# Patient Record
Sex: Female | Born: 1955
Health system: Southern US, Community
[De-identification: ages and names within clinical notes are randomized; demographics above are authoritative.]

## PROBLEM LIST (undated history)

## (undated) DIAGNOSIS — T7840XA Allergy, unspecified, initial encounter: Secondary | ICD-10-CM

## (undated) DIAGNOSIS — M1990 Unspecified osteoarthritis, unspecified site: Secondary | ICD-10-CM

## (undated) DIAGNOSIS — C801 Malignant (primary) neoplasm, unspecified: Secondary | ICD-10-CM

## (undated) DIAGNOSIS — IMO0001 Reserved for inherently not codable concepts without codable children: Secondary | ICD-10-CM

## (undated) DIAGNOSIS — C50919 Malignant neoplasm of unspecified site of unspecified female breast: Secondary | ICD-10-CM

## (undated) DIAGNOSIS — E781 Pure hyperglyceridemia: Secondary | ICD-10-CM

## (undated) DIAGNOSIS — Z9889 Other specified postprocedural states: Secondary | ICD-10-CM

## (undated) DIAGNOSIS — M8700 Idiopathic aseptic necrosis of unspecified bone: Secondary | ICD-10-CM

## (undated) DIAGNOSIS — R112 Nausea with vomiting, unspecified: Secondary | ICD-10-CM

## (undated) DIAGNOSIS — G629 Polyneuropathy, unspecified: Secondary | ICD-10-CM

## (undated) DIAGNOSIS — K589 Irritable bowel syndrome without diarrhea: Secondary | ICD-10-CM

## (undated) DIAGNOSIS — Z5189 Encounter for other specified aftercare: Secondary | ICD-10-CM

## (undated) DIAGNOSIS — I10 Essential (primary) hypertension: Secondary | ICD-10-CM

## (undated) DIAGNOSIS — K760 Fatty (change of) liver, not elsewhere classified: Secondary | ICD-10-CM

## (undated) DIAGNOSIS — G43909 Migraine, unspecified, not intractable, without status migrainosus: Secondary | ICD-10-CM

## (undated) DIAGNOSIS — K149 Disease of tongue, unspecified: Secondary | ICD-10-CM

## (undated) DIAGNOSIS — R002 Palpitations: Secondary | ICD-10-CM

## (undated) DIAGNOSIS — K7581 Nonalcoholic steatohepatitis (NASH): Secondary | ICD-10-CM

## (undated) DIAGNOSIS — M8580 Other specified disorders of bone density and structure, unspecified site: Secondary | ICD-10-CM

## (undated) DIAGNOSIS — E8881 Metabolic syndrome: Secondary | ICD-10-CM

## (undated) DIAGNOSIS — M722 Plantar fascial fibromatosis: Secondary | ICD-10-CM

## (undated) DIAGNOSIS — G4762 Sleep related leg cramps: Secondary | ICD-10-CM

## (undated) DIAGNOSIS — IMO0002 Reserved for concepts with insufficient information to code with codable children: Secondary | ICD-10-CM

## (undated) DIAGNOSIS — Z923 Personal history of irradiation: Secondary | ICD-10-CM

## (undated) DIAGNOSIS — Z9221 Personal history of antineoplastic chemotherapy: Secondary | ICD-10-CM

## (undated) DIAGNOSIS — K219 Gastro-esophageal reflux disease without esophagitis: Secondary | ICD-10-CM

## (undated) DIAGNOSIS — D239 Other benign neoplasm of skin, unspecified: Secondary | ICD-10-CM

## (undated) DIAGNOSIS — Z853 Personal history of malignant neoplasm of breast: Secondary | ICD-10-CM

## (undated) HISTORY — DX: Metabolic syndrome: E88.810

## (undated) HISTORY — DX: Reserved for inherently not codable concepts without codable children: IMO0001

## (undated) HISTORY — DX: Fatty (change of) liver, not elsewhere classified: K76.0

## (undated) HISTORY — DX: Irritable bowel syndrome, unspecified: K58.9

## (undated) HISTORY — DX: Reserved for concepts with insufficient information to code with codable children: IMO0002

## (undated) HISTORY — DX: Other benign neoplasm of skin, unspecified: D23.9

## (undated) HISTORY — DX: Nonalcoholic steatohepatitis (NASH): K75.81

## (undated) HISTORY — DX: Gastro-esophageal reflux disease without esophagitis: K21.9

## (undated) HISTORY — DX: Polyneuropathy, unspecified: G62.9

## (undated) HISTORY — DX: Idiopathic aseptic necrosis of unspecified bone: M87.00

## (undated) HISTORY — DX: Encounter for other specified aftercare: Z51.89

## (undated) HISTORY — PX: DG GALL BLADDER: HXRAD326

## (undated) HISTORY — DX: Palpitations: R00.2

## (undated) HISTORY — DX: Plantar fascial fibromatosis: M72.2

## (undated) HISTORY — DX: Pure hyperglyceridemia: E78.1

## (undated) HISTORY — PX: TONGUE SURGERY: SHX810

## (undated) HISTORY — DX: Unspecified osteoarthritis, unspecified site: M19.90

## (undated) HISTORY — DX: Migraine, unspecified, not intractable, without status migrainosus: G43.909

## (undated) HISTORY — DX: Metabolic syndrome: E88.81

## (undated) HISTORY — DX: Allergy, unspecified, initial encounter: T78.40XA

## (undated) HISTORY — DX: Disease of tongue, unspecified: K14.9

## (undated) HISTORY — DX: Essential (primary) hypertension: I10

## (undated) HISTORY — DX: Sleep related leg cramps: G47.62

## (undated) HISTORY — DX: Other specified disorders of bone density and structure, unspecified site: M85.80

## (undated) HISTORY — DX: Personal history of malignant neoplasm of breast: Z85.3

---

## 1986-10-26 DIAGNOSIS — D239 Other benign neoplasm of skin, unspecified: Secondary | ICD-10-CM

## 1986-10-26 HISTORY — DX: Other benign neoplasm of skin, unspecified: D23.9

## 1986-10-26 HISTORY — PX: WRIST SURGERY: SHX841

## 1998-07-10 ENCOUNTER — Ambulatory Visit (HOSPITAL_COMMUNITY): Admission: RE | Admit: 1998-07-10 | Discharge: 1998-07-10 | Payer: Self-pay | Admitting: Family Medicine

## 1998-10-15 ENCOUNTER — Ambulatory Visit (HOSPITAL_COMMUNITY): Admission: RE | Admit: 1998-10-15 | Discharge: 1998-10-15 | Payer: Self-pay | Admitting: Gastroenterology

## 1999-07-23 ENCOUNTER — Encounter: Payer: Self-pay | Admitting: Family Medicine

## 1999-07-23 ENCOUNTER — Ambulatory Visit (HOSPITAL_COMMUNITY): Admission: RE | Admit: 1999-07-23 | Discharge: 1999-07-23 | Payer: Self-pay | Admitting: Family Medicine

## 1999-08-12 ENCOUNTER — Other Ambulatory Visit: Admission: RE | Admit: 1999-08-12 | Discharge: 1999-08-12 | Payer: Self-pay | Admitting: *Deleted

## 2000-07-14 ENCOUNTER — Encounter: Admission: RE | Admit: 2000-07-14 | Discharge: 2000-07-14 | Payer: Self-pay | Admitting: Family Medicine

## 2000-07-14 ENCOUNTER — Encounter: Payer: Self-pay | Admitting: Family Medicine

## 2000-08-26 ENCOUNTER — Other Ambulatory Visit: Admission: RE | Admit: 2000-08-26 | Discharge: 2000-08-26 | Payer: Self-pay | Admitting: *Deleted

## 2001-08-03 ENCOUNTER — Encounter: Admission: RE | Admit: 2001-08-03 | Discharge: 2001-08-03 | Payer: Self-pay | Admitting: Family Medicine

## 2001-08-03 ENCOUNTER — Encounter: Payer: Self-pay | Admitting: Family Medicine

## 2001-09-27 ENCOUNTER — Other Ambulatory Visit: Admission: RE | Admit: 2001-09-27 | Discharge: 2001-09-27 | Payer: Self-pay | Admitting: Obstetrics and Gynecology

## 2001-10-26 HISTORY — PX: LAPAROSCOPIC CHOLECYSTECTOMY: SUR755

## 2002-05-09 ENCOUNTER — Encounter (HOSPITAL_BASED_OUTPATIENT_CLINIC_OR_DEPARTMENT_OTHER): Payer: Self-pay | Admitting: General Surgery

## 2002-05-10 ENCOUNTER — Encounter (INDEPENDENT_AMBULATORY_CARE_PROVIDER_SITE_OTHER): Payer: Self-pay | Admitting: *Deleted

## 2002-05-10 ENCOUNTER — Ambulatory Visit (HOSPITAL_COMMUNITY): Admission: RE | Admit: 2002-05-10 | Discharge: 2002-05-11 | Payer: Self-pay | Admitting: General Surgery

## 2002-05-10 ENCOUNTER — Encounter (HOSPITAL_BASED_OUTPATIENT_CLINIC_OR_DEPARTMENT_OTHER): Payer: Self-pay | Admitting: General Surgery

## 2002-08-16 ENCOUNTER — Encounter: Payer: Self-pay | Admitting: Family Medicine

## 2002-08-16 ENCOUNTER — Encounter: Admission: RE | Admit: 2002-08-16 | Discharge: 2002-08-16 | Payer: Self-pay | Admitting: Family Medicine

## 2002-11-03 ENCOUNTER — Other Ambulatory Visit: Admission: RE | Admit: 2002-11-03 | Discharge: 2002-11-03 | Payer: Self-pay | Admitting: Obstetrics and Gynecology

## 2003-03-27 DIAGNOSIS — M722 Plantar fascial fibromatosis: Secondary | ICD-10-CM

## 2003-03-27 HISTORY — DX: Plantar fascial fibromatosis: M72.2

## 2003-08-22 ENCOUNTER — Encounter: Admission: RE | Admit: 2003-08-22 | Discharge: 2003-08-22 | Payer: Self-pay | Admitting: Obstetrics and Gynecology

## 2003-09-26 ENCOUNTER — Ambulatory Visit (HOSPITAL_COMMUNITY): Admission: RE | Admit: 2003-09-26 | Discharge: 2003-09-26 | Payer: Self-pay | Admitting: Gastroenterology

## 2003-10-27 DIAGNOSIS — C50919 Malignant neoplasm of unspecified site of unspecified female breast: Secondary | ICD-10-CM

## 2003-10-27 DIAGNOSIS — Z853 Personal history of malignant neoplasm of breast: Secondary | ICD-10-CM

## 2003-10-27 HISTORY — DX: Personal history of malignant neoplasm of breast: Z85.3

## 2003-10-27 HISTORY — DX: Malignant neoplasm of unspecified site of unspecified female breast: C50.919

## 2003-12-11 ENCOUNTER — Other Ambulatory Visit: Admission: RE | Admit: 2003-12-11 | Discharge: 2003-12-11 | Payer: Self-pay | Admitting: Obstetrics and Gynecology

## 2004-03-18 ENCOUNTER — Encounter: Admission: RE | Admit: 2004-03-18 | Discharge: 2004-03-18 | Payer: Self-pay | Admitting: Family Medicine

## 2004-04-01 ENCOUNTER — Encounter: Admission: RE | Admit: 2004-04-01 | Discharge: 2004-04-01 | Payer: Self-pay | Admitting: Family Medicine

## 2004-06-26 ENCOUNTER — Encounter (INDEPENDENT_AMBULATORY_CARE_PROVIDER_SITE_OTHER): Payer: Self-pay | Admitting: *Deleted

## 2004-06-26 ENCOUNTER — Encounter: Admission: RE | Admit: 2004-06-26 | Discharge: 2004-06-26 | Payer: Self-pay | Admitting: Obstetrics and Gynecology

## 2004-06-26 HISTORY — PX: BREAST LUMPECTOMY: SHX2

## 2004-06-27 ENCOUNTER — Encounter (HOSPITAL_COMMUNITY): Admission: RE | Admit: 2004-06-27 | Discharge: 2004-07-23 | Payer: Self-pay | Admitting: General Surgery

## 2004-07-01 ENCOUNTER — Encounter (INDEPENDENT_AMBULATORY_CARE_PROVIDER_SITE_OTHER): Payer: Self-pay | Admitting: *Deleted

## 2004-07-01 ENCOUNTER — Ambulatory Visit (HOSPITAL_BASED_OUTPATIENT_CLINIC_OR_DEPARTMENT_OTHER): Admission: RE | Admit: 2004-07-01 | Discharge: 2004-07-01 | Payer: Self-pay | Admitting: General Surgery

## 2004-07-01 ENCOUNTER — Encounter: Admission: RE | Admit: 2004-07-01 | Discharge: 2004-07-01 | Payer: Self-pay | Admitting: General Surgery

## 2004-07-01 ENCOUNTER — Ambulatory Visit (HOSPITAL_COMMUNITY): Admission: RE | Admit: 2004-07-01 | Discharge: 2004-07-01 | Payer: Self-pay | Admitting: General Surgery

## 2004-07-07 ENCOUNTER — Ambulatory Visit (HOSPITAL_COMMUNITY): Admission: RE | Admit: 2004-07-07 | Discharge: 2004-07-07 | Payer: Self-pay | Admitting: Oncology

## 2004-07-09 ENCOUNTER — Encounter: Admission: RE | Admit: 2004-07-09 | Discharge: 2004-07-09 | Payer: Self-pay | Admitting: Oncology

## 2004-07-10 ENCOUNTER — Ambulatory Visit (HOSPITAL_COMMUNITY): Admission: RE | Admit: 2004-07-10 | Discharge: 2004-07-10 | Payer: Self-pay | Admitting: Oncology

## 2004-07-11 ENCOUNTER — Ambulatory Visit: Admission: RE | Admit: 2004-07-11 | Discharge: 2004-07-11 | Payer: Self-pay | Admitting: Oncology

## 2004-07-11 ENCOUNTER — Encounter: Payer: Self-pay | Admitting: Cardiology

## 2004-07-21 ENCOUNTER — Ambulatory Visit (HOSPITAL_COMMUNITY): Admission: RE | Admit: 2004-07-21 | Discharge: 2004-07-21 | Payer: Self-pay | Admitting: General Surgery

## 2004-07-21 ENCOUNTER — Ambulatory Visit (HOSPITAL_BASED_OUTPATIENT_CLINIC_OR_DEPARTMENT_OTHER): Admission: RE | Admit: 2004-07-21 | Discharge: 2004-07-21 | Payer: Self-pay | Admitting: General Surgery

## 2004-08-04 ENCOUNTER — Ambulatory Visit (HOSPITAL_COMMUNITY): Admission: RE | Admit: 2004-08-04 | Discharge: 2004-08-04 | Payer: Self-pay | Admitting: Oncology

## 2004-08-26 ENCOUNTER — Ambulatory Visit: Payer: Self-pay | Admitting: Oncology

## 2004-09-06 ENCOUNTER — Observation Stay (HOSPITAL_COMMUNITY): Admission: AD | Admit: 2004-09-06 | Discharge: 2004-09-07 | Payer: Self-pay | Admitting: Oncology

## 2004-09-06 ENCOUNTER — Ambulatory Visit: Payer: Self-pay | Admitting: Oncology

## 2004-09-22 ENCOUNTER — Ambulatory Visit: Admission: RE | Admit: 2004-09-22 | Discharge: 2004-10-31 | Payer: Self-pay | Admitting: *Deleted

## 2004-09-23 ENCOUNTER — Encounter (HOSPITAL_COMMUNITY): Admission: RE | Admit: 2004-09-23 | Discharge: 2004-10-25 | Payer: Self-pay | Admitting: Oncology

## 2004-10-13 ENCOUNTER — Ambulatory Visit: Payer: Self-pay | Admitting: Oncology

## 2004-10-17 ENCOUNTER — Ambulatory Visit (HOSPITAL_COMMUNITY): Admission: RE | Admit: 2004-10-17 | Discharge: 2004-10-17 | Payer: Self-pay | Admitting: Oncology

## 2004-12-01 ENCOUNTER — Ambulatory Visit: Payer: Self-pay | Admitting: Oncology

## 2004-12-22 ENCOUNTER — Ambulatory Visit: Admission: RE | Admit: 2004-12-22 | Discharge: 2005-03-06 | Payer: Self-pay | Admitting: *Deleted

## 2005-01-01 ENCOUNTER — Encounter: Admission: RE | Admit: 2005-01-01 | Discharge: 2005-01-01 | Payer: Self-pay | Admitting: General Surgery

## 2005-01-16 ENCOUNTER — Ambulatory Visit: Payer: Self-pay | Admitting: Oncology

## 2005-02-03 ENCOUNTER — Other Ambulatory Visit: Admission: RE | Admit: 2005-02-03 | Discharge: 2005-02-03 | Payer: Self-pay | Admitting: Obstetrics and Gynecology

## 2005-02-04 IMAGING — CT CT PARANASAL SINUSES LIMITED
1 series · 16 of 24 positions shown, 20 images · non-contrast
Comparison: none

CLINICAL DATA: Sinusitis ? follow-up after treatment.  
 LIMITED CT SINUS WITHOUT CONTRAST
 Scans through the paranasal sinuses were performed.  This study is compared to the prior limited CT of 03/18/04.  There is still mucosal thickening and some fluid in the sphenoid sinus, left partition more so than right. This has improved slightly since the prior scan.  There is still some opacification of a few ethmoid air cells which has improved slightly in the interval as well.  The maxillary sinuses and frontal sinus remain normally aerated with no mucosal edema.  No bony abnormality is seen.  The nasal turbinates are normal in size and position and the nasal airway is patent.  
 IMPRESSION
 Some interval improvement in sphenoid and ethmoid sinus disease compared to the study of 03/18/04.

[Series 2: — · axial · 0.33mm/px · z∈[+39,+124]mm · 16 of 24 slices shown, 20 images]
[im 2/24  brain]
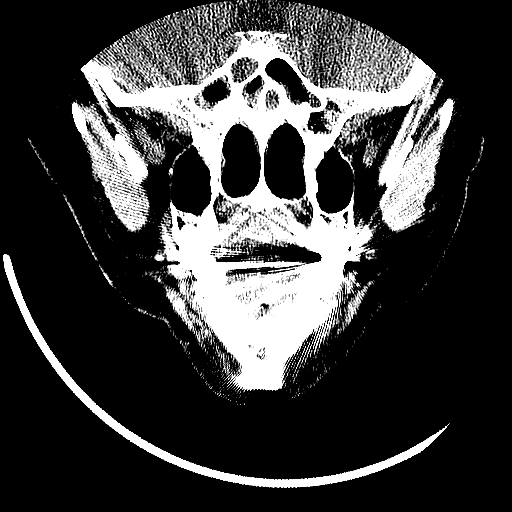
[im 2/24  bone]
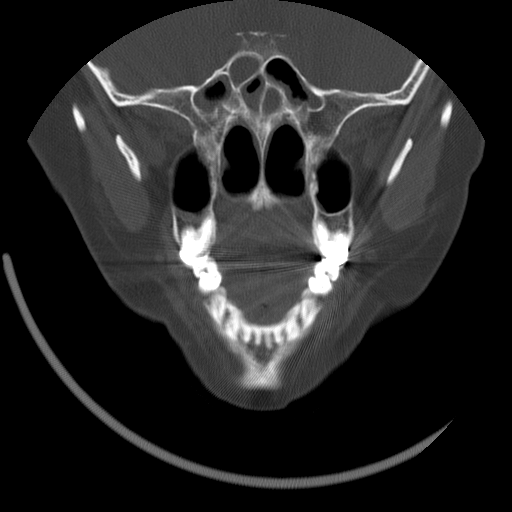
[im 4/24  bone]
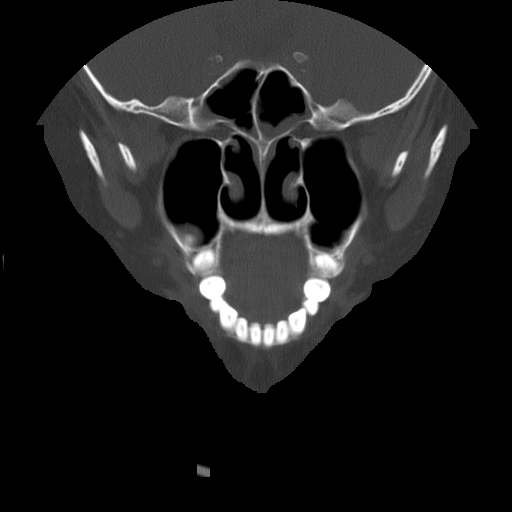
[im 5/24  bone]
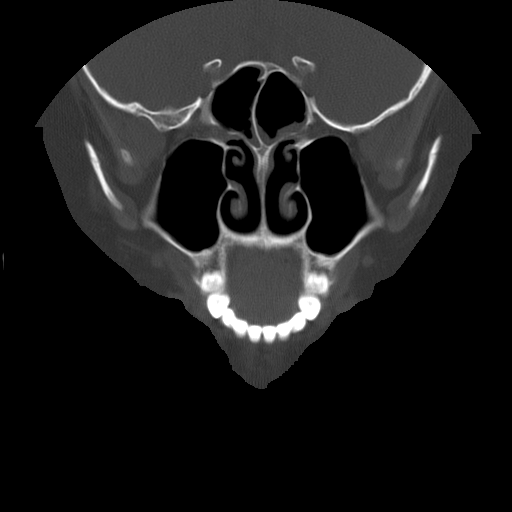
[im 6/24  bone]
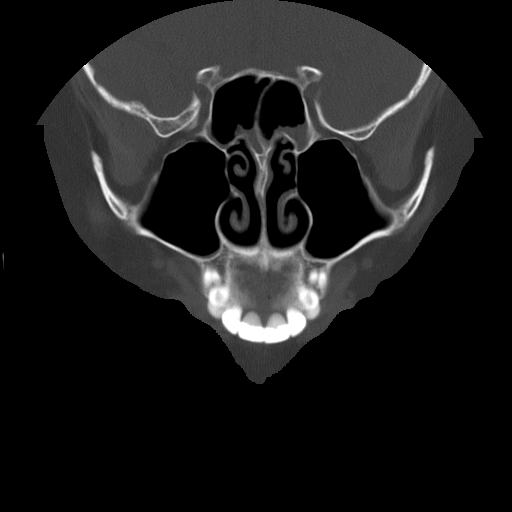
[im 8/24  brain]
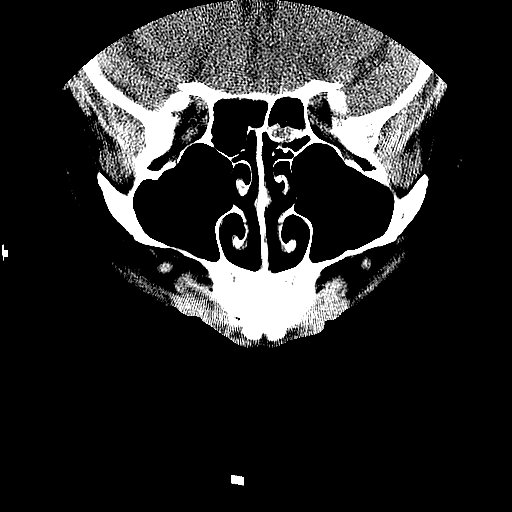
[im 8/24  bone]
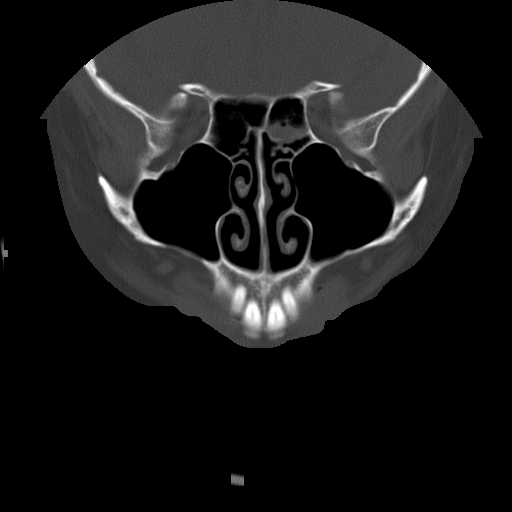
[im 9/24  bone]
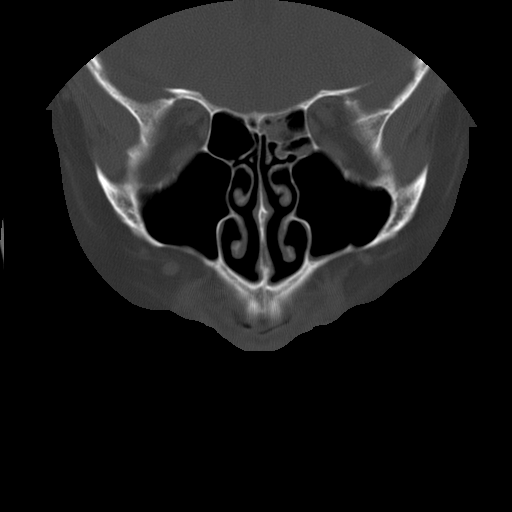
[im 10/24  bone]
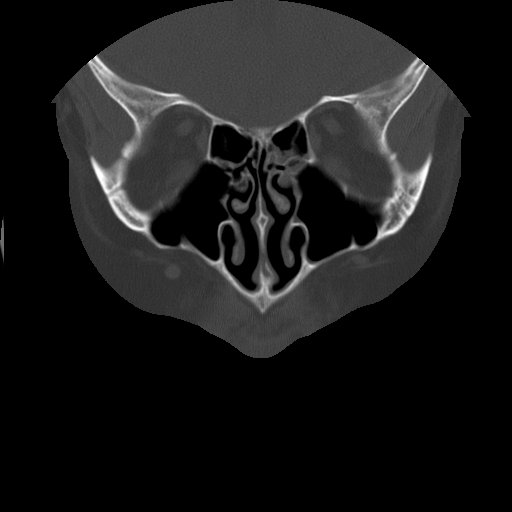
[im 12/24  bone]
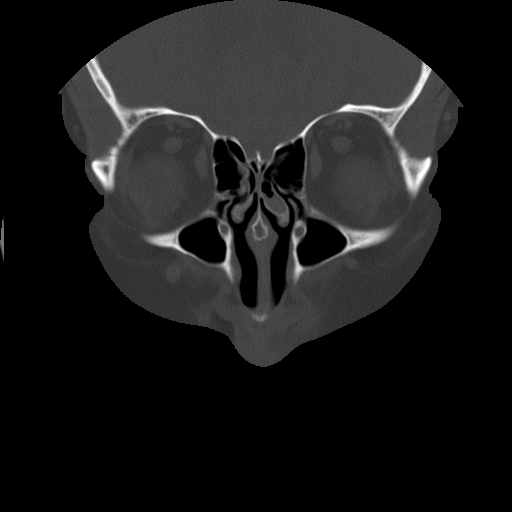
[im 13/24  brain]
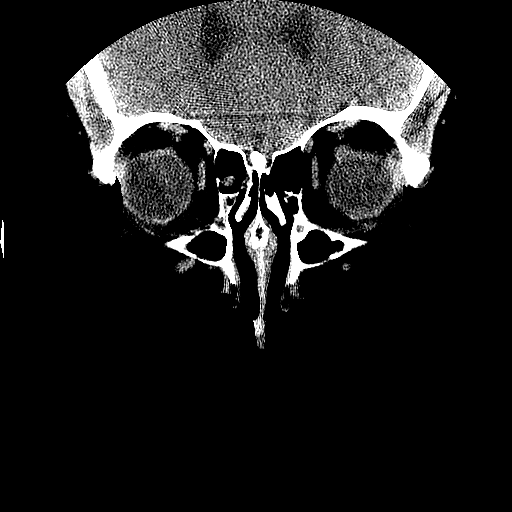
[im 13/24  bone]
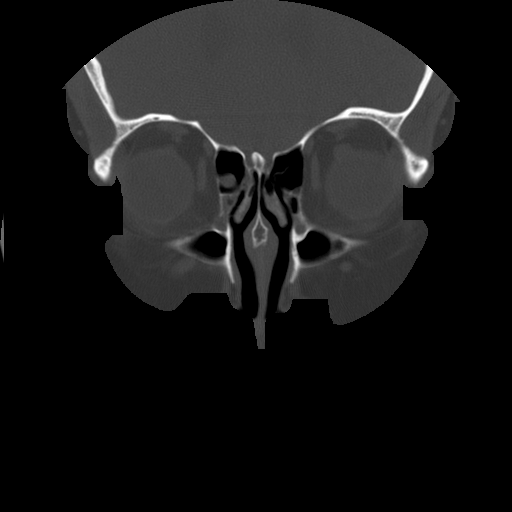
[im 15/24  bone]
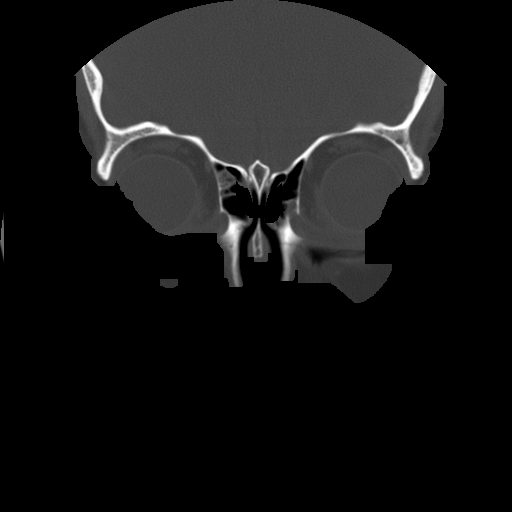
[im 16/24  bone]
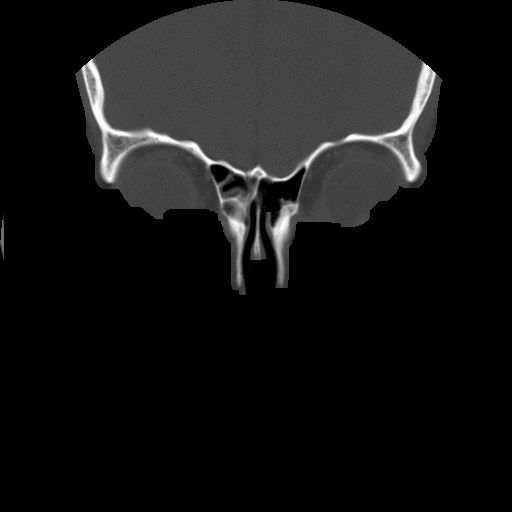
[im 17/24  bone]
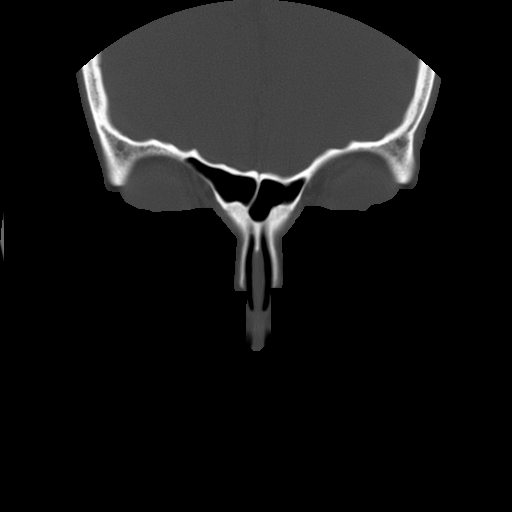
[im 19/24  brain]
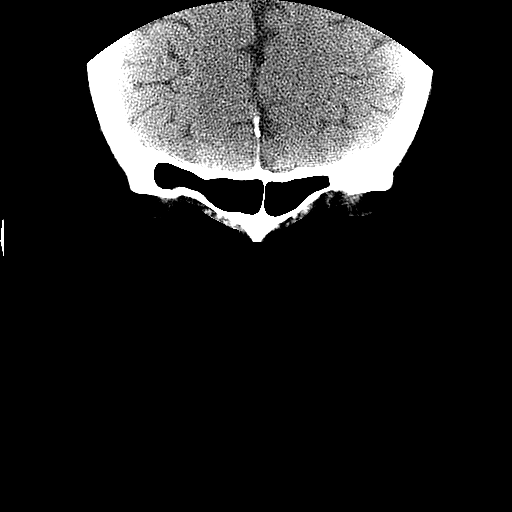
[im 19/24  bone]
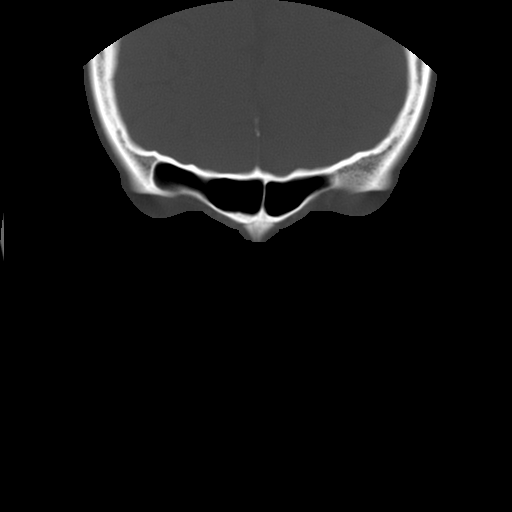
[im 20/24  bone]
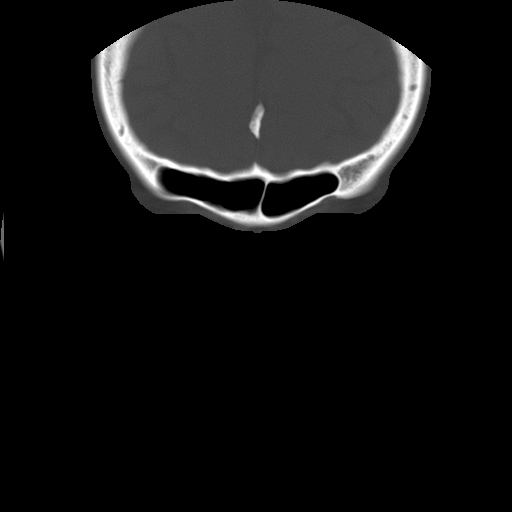
[im 21/24  bone]
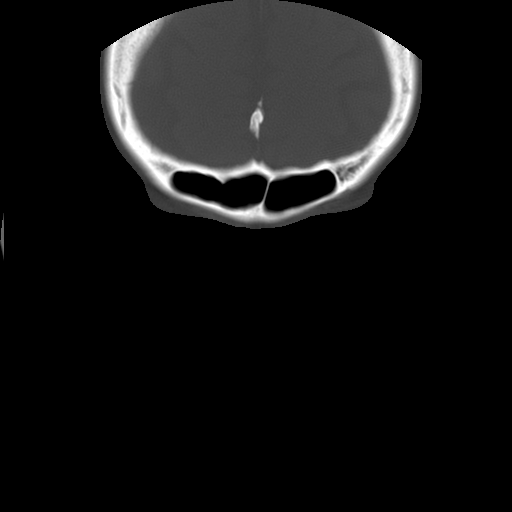
[im 23/24  bone]
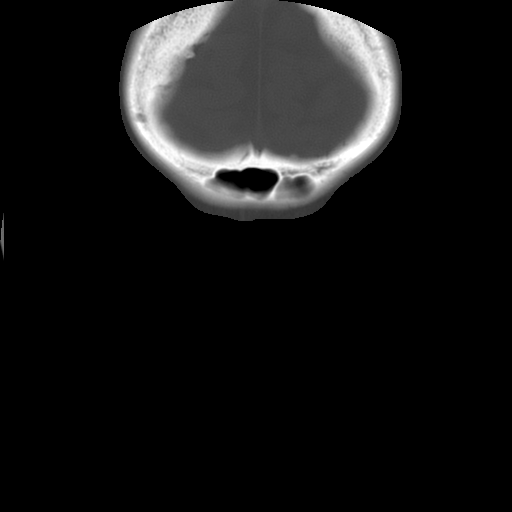

[16 of 24 positions shown; findings below may reference images not displayed]

## 2005-03-19 ENCOUNTER — Ambulatory Visit: Payer: Self-pay | Admitting: Oncology

## 2005-03-31 ENCOUNTER — Ambulatory Visit: Admission: RE | Admit: 2005-03-31 | Discharge: 2005-03-31 | Payer: Self-pay | Admitting: *Deleted

## 2005-05-14 ENCOUNTER — Ambulatory Visit: Payer: Self-pay | Admitting: Oncology

## 2005-07-01 ENCOUNTER — Encounter (INDEPENDENT_AMBULATORY_CARE_PROVIDER_SITE_OTHER): Payer: Self-pay | Admitting: *Deleted

## 2005-07-01 ENCOUNTER — Encounter: Admission: RE | Admit: 2005-07-01 | Discharge: 2005-07-01 | Payer: Self-pay | Admitting: General Surgery

## 2005-08-11 ENCOUNTER — Ambulatory Visit: Payer: Self-pay | Admitting: Oncology

## 2005-10-26 HISTORY — PX: SHOULDER ARTHROSCOPY: SHX128

## 2005-11-19 ENCOUNTER — Ambulatory Visit (HOSPITAL_BASED_OUTPATIENT_CLINIC_OR_DEPARTMENT_OTHER): Admission: RE | Admit: 2005-11-19 | Discharge: 2005-11-19 | Payer: Self-pay | Admitting: Orthopaedic Surgery

## 2005-12-28 ENCOUNTER — Encounter: Admission: RE | Admit: 2005-12-28 | Discharge: 2005-12-28 | Payer: Self-pay | Admitting: Oncology

## 2005-12-28 ENCOUNTER — Encounter: Admission: RE | Admit: 2005-12-28 | Discharge: 2005-12-28 | Payer: Self-pay | Admitting: General Surgery

## 2006-02-04 ENCOUNTER — Other Ambulatory Visit: Admission: RE | Admit: 2006-02-04 | Discharge: 2006-02-04 | Payer: Self-pay | Admitting: Obstetrics & Gynecology

## 2006-02-05 ENCOUNTER — Ambulatory Visit: Payer: Self-pay | Admitting: Oncology

## 2006-02-08 LAB — COMPREHENSIVE METABOLIC PANEL
ALT: 52 U/L — ABNORMAL HIGH (ref 0–40)
AST: 33 U/L (ref 0–37)
Albumin: 4.8 g/dL (ref 3.5–5.2)
Alkaline Phosphatase: 83 U/L (ref 39–117)
BUN: 21 mg/dL (ref 6–23)
CO2: 26 mEq/L (ref 19–32)
Calcium: 9.5 mg/dL (ref 8.4–10.5)
Chloride: 104 mEq/L (ref 96–112)
Creatinine, Ser: 0.8 mg/dL (ref 0.4–1.2)
Glucose, Bld: 106 mg/dL — ABNORMAL HIGH (ref 70–99)
Potassium: 4.7 mEq/L (ref 3.5–5.3)
Sodium: 143 mEq/L (ref 135–145)
Total Bilirubin: 0.5 mg/dL (ref 0.3–1.2)
Total Protein: 6.9 g/dL (ref 6.0–8.3)

## 2006-02-08 LAB — CBC WITH DIFFERENTIAL/PLATELET
BASO%: 1.3 % (ref 0.0–2.0)
Basophils Absolute: 0 10*3/uL (ref 0.0–0.1)
EOS%: 3.9 % (ref 0.0–7.0)
Eosinophils Absolute: 0.1 10*3/uL (ref 0.0–0.5)
HCT: 37.5 % (ref 34.8–46.6)
HGB: 13.2 g/dL (ref 11.6–15.9)
LYMPH%: 39.1 % (ref 14.0–48.0)
MCH: 32.9 pg (ref 26.0–34.0)
MCHC: 35.2 g/dL (ref 32.0–36.0)
MCV: 93.4 fL (ref 81.0–101.0)
MONO#: 0.5 10*3/uL (ref 0.1–0.9)
MONO%: 12.9 % (ref 0.0–13.0)
NEUT#: 1.5 10*3/uL (ref 1.5–6.5)
NEUT%: 42.8 % (ref 39.6–76.8)
Platelets: 200 10*3/uL (ref 145–400)
RBC: 4.01 10*6/uL (ref 3.70–5.32)
RDW: 12.6 % (ref 11.3–14.5)
WBC: 3.6 10*3/uL — ABNORMAL LOW (ref 3.9–10.0)
lymph#: 1.4 10*3/uL (ref 0.9–3.3)

## 2006-02-08 LAB — CANCER ANTIGEN 27.29: CA 27.29: 16 U/mL (ref 0–39)

## 2006-07-08 ENCOUNTER — Encounter: Admission: RE | Admit: 2006-07-08 | Discharge: 2006-07-08 | Payer: Self-pay | Admitting: Oncology

## 2006-07-18 ENCOUNTER — Inpatient Hospital Stay (HOSPITAL_COMMUNITY): Admission: EM | Admit: 2006-07-18 | Discharge: 2006-07-19 | Payer: Self-pay | Admitting: Emergency Medicine

## 2006-07-21 ENCOUNTER — Ambulatory Visit: Payer: Self-pay | Admitting: Gastroenterology

## 2006-08-13 ENCOUNTER — Ambulatory Visit: Payer: Self-pay | Admitting: Oncology

## 2006-09-21 ENCOUNTER — Ambulatory Visit: Payer: Self-pay | Admitting: Internal Medicine

## 2006-10-29 ENCOUNTER — Ambulatory Visit: Payer: Self-pay

## 2006-10-29 ENCOUNTER — Encounter: Payer: Self-pay | Admitting: Cardiology

## 2007-02-03 ENCOUNTER — Other Ambulatory Visit: Admission: RE | Admit: 2007-02-03 | Discharge: 2007-02-03 | Payer: Self-pay | Admitting: Obstetrics & Gynecology

## 2007-02-10 ENCOUNTER — Ambulatory Visit: Payer: Self-pay | Admitting: Oncology

## 2007-03-02 ENCOUNTER — Encounter: Admission: RE | Admit: 2007-03-02 | Discharge: 2007-03-02 | Payer: Self-pay | Admitting: Orthopaedic Surgery

## 2007-03-18 ENCOUNTER — Encounter: Admission: RE | Admit: 2007-03-18 | Discharge: 2007-03-18 | Payer: Self-pay | Admitting: Obstetrics & Gynecology

## 2007-06-10 ENCOUNTER — Encounter: Admission: RE | Admit: 2007-06-10 | Discharge: 2007-06-10 | Payer: Self-pay | Admitting: Orthopaedic Surgery

## 2007-07-11 ENCOUNTER — Encounter: Admission: RE | Admit: 2007-07-11 | Discharge: 2007-07-11 | Payer: Self-pay | Admitting: General Surgery

## 2007-07-27 HISTORY — PX: ANKLE SURGERY: SHX546

## 2007-08-02 ENCOUNTER — Ambulatory Visit: Payer: Self-pay | Admitting: Oncology

## 2008-01-02 ENCOUNTER — Encounter: Admission: RE | Admit: 2008-01-02 | Discharge: 2008-01-02 | Payer: Self-pay | Admitting: Oncology

## 2008-01-05 ENCOUNTER — Encounter: Admission: RE | Admit: 2008-01-05 | Discharge: 2008-01-05 | Payer: Self-pay | Admitting: Oncology

## 2008-02-02 ENCOUNTER — Ambulatory Visit: Payer: Self-pay | Admitting: Oncology

## 2008-02-09 ENCOUNTER — Other Ambulatory Visit: Admission: RE | Admit: 2008-02-09 | Discharge: 2008-02-09 | Payer: Self-pay | Admitting: Obstetrics and Gynecology

## 2008-07-04 ENCOUNTER — Encounter: Admission: RE | Admit: 2008-07-04 | Discharge: 2008-07-04 | Payer: Self-pay | Admitting: General Surgery

## 2008-07-19 ENCOUNTER — Encounter: Admission: RE | Admit: 2008-07-19 | Discharge: 2008-07-19 | Payer: Self-pay | Admitting: Gastroenterology

## 2008-08-09 ENCOUNTER — Ambulatory Visit: Payer: Self-pay | Admitting: Oncology

## 2008-08-17 LAB — CREATININE, SERUM: Creatinine, Ser: 0.7 mg/dL (ref 0.40–1.20)

## 2008-08-17 LAB — RESEARCH LABS

## 2008-12-25 ENCOUNTER — Ambulatory Visit: Payer: Self-pay | Admitting: Oncology

## 2008-12-27 LAB — CBC WITH DIFFERENTIAL/PLATELET
BASO%: 0.4 % (ref 0.0–2.0)
Basophils Absolute: 0 10*3/uL (ref 0.0–0.1)
EOS%: 2.4 % (ref 0.0–7.0)
Eosinophils Absolute: 0.1 10*3/uL (ref 0.0–0.5)
HCT: 37.2 % (ref 34.8–46.6)
HGB: 12.9 g/dL (ref 11.6–15.9)
LYMPH%: 27.5 % (ref 14.0–49.7)
MCH: 33.9 pg (ref 25.1–34.0)
MCHC: 34.7 g/dL (ref 31.5–36.0)
MCV: 97.8 fL (ref 79.5–101.0)
MONO#: 0.4 10*3/uL (ref 0.1–0.9)
MONO%: 7.2 % (ref 0.0–14.0)
NEUT#: 3.5 10*3/uL (ref 1.5–6.5)
NEUT%: 62.5 % (ref 38.4–76.8)
Platelets: 178 10*3/uL (ref 145–400)
RBC: 3.81 10*6/uL (ref 3.70–5.45)
RDW: 13.2 % (ref 11.2–14.5)
WBC: 5.5 10*3/uL (ref 3.9–10.3)
lymph#: 1.5 10*3/uL (ref 0.9–3.3)

## 2008-12-27 LAB — RESEARCH LABS

## 2008-12-28 LAB — COMPREHENSIVE METABOLIC PANEL
ALT: 34 U/L (ref 0–35)
AST: 27 U/L (ref 0–37)
Albumin: 4.3 g/dL (ref 3.5–5.2)
Alkaline Phosphatase: 51 U/L (ref 39–117)
BUN: 20 mg/dL (ref 6–23)
CO2: 22 mEq/L (ref 19–32)
Calcium: 9.2 mg/dL (ref 8.4–10.5)
Chloride: 109 mEq/L (ref 96–112)
Creatinine, Ser: 0.7 mg/dL (ref 0.40–1.20)
Glucose, Bld: 101 mg/dL — ABNORMAL HIGH (ref 70–99)
Potassium: 4.1 mEq/L (ref 3.5–5.3)
Sodium: 143 mEq/L (ref 135–145)
Total Bilirubin: 0.5 mg/dL (ref 0.3–1.2)
Total Protein: 6.2 g/dL (ref 6.0–8.3)

## 2008-12-28 LAB — VITAMIN D 25 HYDROXY (VIT D DEFICIENCY, FRACTURES): Vit D, 25-Hydroxy: 44 ng/mL (ref 30–89)

## 2008-12-28 LAB — CANCER ANTIGEN 27.29: CA 27.29: 20 U/mL (ref 0–39)

## 2009-01-31 ENCOUNTER — Other Ambulatory Visit: Admission: RE | Admit: 2009-01-31 | Discharge: 2009-01-31 | Payer: Self-pay | Admitting: Obstetrics & Gynecology

## 2009-02-11 ENCOUNTER — Encounter: Admission: RE | Admit: 2009-02-11 | Discharge: 2009-02-11 | Payer: Self-pay | Admitting: Obstetrics & Gynecology

## 2009-05-13 ENCOUNTER — Encounter: Admission: RE | Admit: 2009-05-13 | Discharge: 2009-05-13 | Payer: Self-pay | Admitting: Oncology

## 2009-07-05 ENCOUNTER — Encounter: Admission: RE | Admit: 2009-07-05 | Discharge: 2009-07-05 | Payer: Self-pay | Admitting: Oncology

## 2009-07-09 ENCOUNTER — Ambulatory Visit: Payer: Self-pay | Admitting: Oncology

## 2009-07-09 LAB — COMPREHENSIVE METABOLIC PANEL
ALT: 33 U/L (ref 0–35)
AST: 29 U/L (ref 0–37)
Albumin: 4.6 g/dL (ref 3.5–5.2)
Alkaline Phosphatase: 44 U/L (ref 39–117)
BUN: 21 mg/dL (ref 6–23)
CO2: 27 mEq/L (ref 19–32)
Calcium: 9.3 mg/dL (ref 8.4–10.5)
Chloride: 103 mEq/L (ref 96–112)
Creatinine, Ser: 0.83 mg/dL (ref 0.40–1.20)
Glucose, Bld: 96 mg/dL (ref 70–99)
Potassium: 3.7 mEq/L (ref 3.5–5.3)
Sodium: 140 mEq/L (ref 135–145)
Total Bilirubin: 0.5 mg/dL (ref 0.3–1.2)
Total Protein: 6.8 g/dL (ref 6.0–8.3)

## 2009-07-09 LAB — CBC WITH DIFFERENTIAL/PLATELET
BASO%: 0.5 % (ref 0.0–2.0)
Basophils Absolute: 0 10*3/uL (ref 0.0–0.1)
EOS%: 1.8 % (ref 0.0–7.0)
Eosinophils Absolute: 0.1 10*3/uL (ref 0.0–0.5)
HCT: 37.5 % (ref 34.8–46.6)
HGB: 13.1 g/dL (ref 11.6–15.9)
LYMPH%: 29.1 % (ref 14.0–49.7)
MCH: 34.6 pg — ABNORMAL HIGH (ref 25.1–34.0)
MCHC: 34.9 g/dL (ref 31.5–36.0)
MCV: 99.1 fL (ref 79.5–101.0)
MONO#: 0.3 10*3/uL (ref 0.1–0.9)
MONO%: 8.1 % (ref 0.0–14.0)
NEUT#: 2.4 10*3/uL (ref 1.5–6.5)
NEUT%: 60.5 % (ref 38.4–76.8)
Platelets: 176 10*3/uL (ref 145–400)
RBC: 3.79 10*6/uL (ref 3.70–5.45)
RDW: 12.9 % (ref 11.2–14.5)
WBC: 4 10*3/uL (ref 3.9–10.3)
lymph#: 1.1 10*3/uL (ref 0.9–3.3)

## 2009-07-09 LAB — CANCER ANTIGEN 27.29: CA 27.29: 24 U/mL (ref 0–39)

## 2009-07-09 LAB — FOLLICLE STIMULATING HORMONE: FSH: 15.6 m[IU]/mL

## 2009-07-09 LAB — LUTEINIZING HORMONE: LH: 8.8 m[IU]/mL

## 2009-07-10 ENCOUNTER — Encounter: Admission: RE | Admit: 2009-07-10 | Discharge: 2009-07-10 | Payer: Self-pay | Admitting: Surgery

## 2009-07-15 LAB — ESTRADIOL, ULTRA SENS

## 2009-09-06 ENCOUNTER — Ambulatory Visit: Payer: Self-pay | Admitting: Oncology

## 2009-09-10 LAB — FOLLICLE STIMULATING HORMONE: FSH: 15.3 m[IU]/mL

## 2009-09-18 LAB — ESTRADIOL, ULTRA SENS: Estradiol, Ultra Sensitive: <2 pg/mL

## 2010-07-07 ENCOUNTER — Encounter: Admission: RE | Admit: 2010-07-07 | Discharge: 2010-07-07 | Payer: Self-pay | Admitting: Oncology

## 2010-07-09 ENCOUNTER — Ambulatory Visit: Payer: Self-pay | Admitting: Oncology

## 2010-07-11 LAB — CBC WITH DIFFERENTIAL/PLATELET
BASO%: 0.6 % (ref 0.0–2.0)
Basophils Absolute: 0 10*3/uL (ref 0.0–0.1)
EOS%: 5 % (ref 0.0–7.0)
Eosinophils Absolute: 0.3 10*3/uL (ref 0.0–0.5)
HCT: 40.1 % (ref 34.8–46.6)
HGB: 13.6 g/dL (ref 11.6–15.9)
LYMPH%: 27.7 % (ref 14.0–49.7)
MCH: 33.6 pg (ref 25.1–34.0)
MCHC: 34 g/dL (ref 31.5–36.0)
MCV: 98.7 fL (ref 79.5–101.0)
MONO#: 0.5 10*3/uL (ref 0.1–0.9)
MONO%: 8.3 % (ref 0.0–14.0)
NEUT#: 3.4 10*3/uL (ref 1.5–6.5)
NEUT%: 58.4 % (ref 38.4–76.8)
Platelets: 193 10*3/uL (ref 145–400)
RBC: 4.06 10*6/uL (ref 3.70–5.45)
RDW: 12.8 % (ref 11.2–14.5)
WBC: 5.9 10*3/uL (ref 3.9–10.3)
lymph#: 1.6 10*3/uL (ref 0.9–3.3)

## 2010-07-11 LAB — COMPREHENSIVE METABOLIC PANEL
ALT: 43 U/L — ABNORMAL HIGH (ref 0–35)
AST: 37 U/L (ref 0–37)
Albumin: 4.3 g/dL (ref 3.5–5.2)
Alkaline Phosphatase: 59 U/L (ref 39–117)
BUN: 15 mg/dL (ref 6–23)
CO2: 29 mEq/L (ref 19–32)
Calcium: 9.7 mg/dL (ref 8.4–10.5)
Chloride: 105 mEq/L (ref 96–112)
Creatinine, Ser: 0.83 mg/dL (ref 0.40–1.20)
Glucose, Bld: 98 mg/dL (ref 70–99)
Potassium: 4 mEq/L (ref 3.5–5.3)
Sodium: 141 mEq/L (ref 135–145)
Total Bilirubin: 0.9 mg/dL (ref 0.3–1.2)
Total Protein: 7.3 g/dL (ref 6.0–8.3)

## 2010-07-12 LAB — CANCER ANTIGEN 27.29: CA 27.29: 13 U/mL (ref 0–39)

## 2010-07-12 LAB — FOLLICLE STIMULATING HORMONE: FSH: 29.6 m[IU]/mL

## 2010-07-12 LAB — VITAMIN D 25 HYDROXY (VIT D DEFICIENCY, FRACTURES): Vit D, 25-Hydroxy: 52 ng/mL (ref 30–89)

## 2010-07-22 LAB — ESTRADIOL, ULTRA SENS: Estradiol, Ultra Sensitive: <2 pg/mL

## 2010-11-16 ENCOUNTER — Encounter: Payer: Self-pay | Admitting: Oncology

## 2010-11-17 ENCOUNTER — Encounter: Payer: Self-pay | Admitting: Oncology

## 2010-11-17 ENCOUNTER — Encounter: Payer: Self-pay | Admitting: Obstetrics & Gynecology

## 2011-02-17 ENCOUNTER — Other Ambulatory Visit: Payer: Self-pay | Admitting: Endocrinology

## 2011-02-17 DIAGNOSIS — M8580 Other specified disorders of bone density and structure, unspecified site: Secondary | ICD-10-CM

## 2011-03-13 NOTE — Op Note (Signed)
[place]  Patient:    FUMIYE, LUBBEN Visit Number: [id number] MRN: [id number]          Service Type: DSU Location: [phone] Attending Physician:  Sherolyn Buba Dictated by:   Candee Furbish. Bubba Camp, M.D. Proc. Date: 05/10/02 Admit Date:  05/10/2002 Discharge Date: 05/11/2002                             Operative Report  PREOPERATIVE DIAGNOSIS:  Chronic calculous cholecystitis.  POSTOPERATIVE DIAGNOSIS:  Chronic calculous cholecystitis.  PROCEDURE:  Laparoscopic cholecystectomy with intraoperative cholangiogram.  SURGEON:  Saralyn Pilar L. Bubba Camp, M.D.  ASSISTANT:  Maia Plan. Lindon Romp, M.D.  ANESTHESIA:  General.  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  Ultrasound of the abdomen shows cholelithiasis, all other structures being normal.  There was no pericholecystic fluid or ductal dilatation.  Liver function studies have been normal.  She had a mild elevation of her lipase, which has now returned to normal.  She is brought to the operating room for a laparoscopic cholecystectomy after the risks and potential benefits of surgery have been discussed and all questions answered.  DESCRIPTION OF PROCEDURE:  Following the induction of satisfactory general anesthesia with the patient positioned supinely, the abdomen is routinely prepped and draped to be included in the sterile operative field, open laparoscopy being created at the umbilicus with insertion of a Hasson cannula, insufflation of the peritoneal cavity to 15 mmHg pressure.  Visual exploration of the abdomen ensued.  There were some mild filmy adhesions in the pelvis from two previous cesarean sections.  Liver edges were sharp, liver surfaces smooth.  The gallbladder was somewhat  hydropic.  There were no adhesions to it.  The anterior gastric wall, duodenal sweep, and peritoneum appeared to be normal.  The small and large intestine visualized did not appear to have any abnormalities.  Under direct vision epigastric and lateral ports were placed.  The gallbladder was grasped and retracted cephalad.  The dissection was carried down to the region of the ampulla and with isolation of the cystic artery, which was traced up to its entry into the gallbladder wall and the cystic duct, which was traced up to the cystic duct-gallbladder junction and down to the common duct-cystic duct junction.  The cystic artery was doubly clipped and then transected.  The cystic duct was clipped proximally and opened.  I inserted a Reddick catheter through a 14-gauge Angiocath into the abdomen and cannulated the cystic duct.  Through it I injected one-half strength Hypaque into the biliary system.  The resulting cholangiogram showed free flow of contrast into the duodenum.  The extrahepatic biliary ducts appeared to be normal.  There were no filling defects noted.  The cystic duct catheter was then removed and the cystic duct was then doubly clipped and transected, and the gallbladder was then dissected free from the liver bed using electrocautery and maintaining hemostasis throughout the course of the dissection.  At the end of the dissection the liver bed was again inspected and noted to be dry.  The right upper quadrant was then thoroughly irrigated with normal saline and sucked dry.  The gallbladder was then retrieved through the umbilical port using an Endopouch.  Sponge, instrument, and  sharp counts were verified     d. The pneumoperitoneum was allowed to deflate.  I used Valsalva maneuvers to evacuate as much of the CO2 as possible.  Trocars were removed under direct vision, and the wound was closed in layers as follows:  Umbilical wound in two layers with 0 Dexon and 4-0 Dexon,  epigastric and lateral flank wounds closed with 4-0 Dexon sutures.  All wounds were injected with 0.5% Marcaine for additional analgesia.  These were reinforced with Steri-Strips, and sterile dressings were applied.  Anesthetic reversed, the patient removed from the operating room to the recovery room in stable condition.  She tolerated the procedure well. Dictated by:   Candee Furbish. Bubba Camp, M.D. Attending Physician:  Sherolyn Buba DD:  05/10/02 TD:  05/13/02 Job: [id number]

## 2011-03-13 NOTE — Op Note (Signed)
NAMETORRANCE, FRECH                  ACCOUNT NO.:  [id number]   MEDICAL RECORD NO.:  [id number]          PATIENT TYPE:  AMB   LOCATION:  DSC                          FACILITY:  [facility]   PHYSICIAN:  Vonna Kotyk. Whitfield, M.D.DATE OF BIRTH:  12-Jul-1956   DATE OF PROCEDURE:  11/19/2005  DATE OF DISCHARGE:                                 OPERATIVE REPORT   PREOPERATIVE DIAGNOSIS:  Impingement right shoulder with possible anterior  labral tear.   POSTOPERATIVE DIAGNOSIS:  Impingement right shoulder with anterior labral  tear.   PROCEDURE:  1.  Arthroscopic labral repair.  2.  Arthroscopic subacromial decompression.   SURGEON:  Vonna Kotyk. Durward Fortes, M.D.   ASSISTANT:  Aaron Edelman D. Petrarca, P.A.-C.   ANESTHESIA:  General orotracheal with interscalene nerve block.   COMPLICATIONS:  None.   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  She has evidence of  impingement and has not responded cortisone injections or anti-inflammatory  medicine.  She had a recent MRI scan revealing mild AC joint degenerative  changes, mild supraspinatus tendinosis without evidence of rotator cuff tear  but there was an inferior par labral cyst implying an underlying tear of the  anterior inferior labrum.  She has had a remote history of shoulder  dislocation, but no recent injuries. She now is to have arthroscopic  evaluation.   DESCRIPTION OF PROCEDURE:  With the patient comfortable on the operating  table and under general orotracheal anesthesia, the patient was placed in  the lateral decubitus position and was placed in the semi-sitting position  with a shoulder frame.  The right shoulder was then prepped with DuraPrep  from the base of the neck circumferentially to the mid forearm.  Sterile  draping was performed.   A marking pen was used to outline the [employer] joint, the coracoid and the  acromion.  Preoperative interscalene  nerve block provided excellent  analgesia.   At a point a fingerbreadth inferior and medial to the post angle acromion, a  small stab wound was made.  The arthroscope was then easily placed into the  shoulder joint. Diagnostic arthroscopy was performed with evidence of a tear  of the anterior labral beginning at about the 3 o'clock position coursing to  about the 4 or 5 o'clock position.  A second portal was established  anteriorly with the cannula, and I probed the labrum.  The biceps anchor was  fine, and there was no evidence of a slap lesion.  The rotator cuff was not  torn.  There was no loose bodies nor any evidence of humeral head or glenoid  chondromalacia.  As I further probed the cyst, I liberated cyst fluid.  There was evidence of a 1 cm cyst in the surrounding labral tissue by MR  scan.  I then proceeded to repair the  labral tear by debriding the frayed  edges and then used a small ball-tipped bur to debride the bony glenoid.  The Arthrex push lock anchor was utilized.  The spear was  inserted with a  very good purchase of the labrum.  FiberWire was inserted through the loop  and removed from the joint in a loop fashion through this the tissue with a  with a grabber.  The drill obturator was inserted  and a very nice drill  hole through the glenoid rim. The polyethylene guide was then placed over  the drill to maintain the position of the glenoid drill hole.  The ends of  the looped FiberWire were then placed through the eye of the push lock  anchor.  It was then placed through the cannula into the drill holes after  removing the guide obturator.  It was placed in position and then with  tension on the suture tapped in place.  It had very nice purchase and very  nice apposition of the labrum to the bony glenoid.  The intra-articular  scissors were then utilized to cut the suture. The joint was probed with  evidence of loose material and very nice repair of the labrum.   The  arthroscope was then placed in the subacromial space posteriorly, the  cannula subacromial space anteriorly and a lateral subacromial portal  established laterally and arthroscopic subacromial decompression was  performed.  I  removed moderate bursal tissue with the ArthroCare wand and  there was overhang of the anterior lateral acromion but a very nice  resection with a 6 mm bur and the ArthroCare wand.  I did not  see any  evidence of bursal surface tearing.  The subacromial space was copiously  irrigated with saline solution. The lateral and posterior portals were left  open.  The anterior portal was closed with a single 4-0 Ethilon and 0.25%  Marcaine with epinephrine was injected i8n the wound edges.  Sterile bulky  dressing was applied.  Sling was applied.  The patient tolerated without  complications.   PLAN:  1.  Sling.  2.  Percocet for pain.  3.  Office in one week.      Vonna Kotyk. Durward Fortes, M.D.  Electronically Signed     PWW/MEDQ  D:  11/19/2005  T:  11/20/2005  Job:  [id number]

## 2011-03-13 NOTE — H&P (Signed)
[name], Mack NO.:  [id number]   MEDICAL RECORD NO.:  [id number]          PATIENT TYPE:  INP   LOCATION:  1309                         FACILITY:  [facility]   PHYSICIAN:  Malcolm T. Fuller Plan, MD, FACGDATE OF BIRTH:  03-13-1956   DATE OF ADMISSION:  07/18/2006  DATE OF DISCHARGE:                                HISTORY & PHYSICAL   CHIEF COMPLAINT:  Abdominal pain and presyncope.   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  She is status post cholecystectomy with a negative IOC in [id number]  done per Dr. Bubba Camp.  She had undergone colonoscopy in December2004 for  screening due to family history of colon cancer in her father; this was a  normal exam.  Apparently, her IBS has not been active for several months.  She has had problems with constipation since starting [employer]  programs about 9 months ago.  She has had a 35-pound intentional weight  loss.  Yesterday, on July 17, 2006, she had she developed acute  epigastric pain that resolved after eating several Tums, as well as taking a  second dose of Nexium.  Today, she developed generalized crampy abdominal  pain, more pronounced in the epigastrium and radiating into her mid back.  This was associated with a presyncopal episode, the patient feels due to the  severity of the pain.  She has had mild nausea, but no vomiting, no  diarrhea, fever, chills, dysuria, chest pain, shortness of breath, etc.  She  had had a normal bowel movement on September 22, no melena or hematochezia  noted.  She has not noted any recent changes in stool caliber, etc.  She is  seen and evaluated at this time in the emergency room and admitted for  supportive management and further diagnostic evaluation.   CURRENT MEDICATIONS:  1. Tamoxifen 20 mg per day.  2. Omacor 2 mg b.i.d.  3. Docusate sodium 100 mg b.i.d.  4.  Nexium 40 mg q.a.m.  5. Metoprolol 100 mg q.a.m.  6. Niacin 500 mg q.a.m.  7. Aspirin 325 q.a.m.  8. Multivitamin daily.  9. Clonazepam 1 mg nightly.  10.Ambien DR 12.5 nightly.   ALLERGIES:  NO KNOWN DRUG ALLERGIES.   PAST MEDICAL HISTORY:  1. Pertinent for breast cancer.  She is status post left lumpectomy and      sentinel node biopsy, September2005, done per Dr. Bubba Camp and followed      by radiation and chemotherapy.  She has had no evidence of recurrence,      but complication of peripheral neuropathy  2. Status post laparoscopic cholecystectomy and IOC for chronic calculous      cholecystitis, [id number].  3. Migraine headaches.  4. IBS.  5. GERD.  6. History of pyelonephritis.  7. Status post C-section x2.  8. Status post right shoulder surgery, January2007.   FAMILY HISTORY:  Positive for colon cancer in the patient's father.   SOCIAL HISTORY:  The patient is married.  She is  employed as a Engineer, drilling,  currently working with hospice.  She drinks alcohol socially, is a  nonsmoker.   REVIEW OF SYSTEMS:  CARDIOVASCULAR:  Negative for chest pain or anginal  symptoms.  PULMONARY:  Negative for shortness of breath.  She has had some  mild cough.  NEUROLOGIC:  Pertinent for neuropathy symptoms and some  neuropathic pain in her hands and feet.  GI:  As above.   PHYSICAL EXAM:  GENERAL:  Per Dr. Fuller Plan, a well-developed, uncomfortable,  pleasant white female, alert and oriented x3.  VITAL SIGNS:  Temperature is 97, blood pressure 120/90, pulse 65,  respirations 18.  HEENT:  Nontraumatic, normocephalic.  EOMI.  PERRLA.  Sclerae anicteric.  NECK:  Supple.  CARDIOVASCULAR:  Regular rate and rhythm with S1 and S2, no murmur, rub or  gallop.  PULMONARY:  Clear to A&P  BACK:  There is no CVA tenderness or spinal tenderness.  ABDOMEN:  Generalized tenderness, but a bit more focal in the epigastrium.  Bowel sounds are active.  She is nondistended.  There is no rebound or  guarding.   No palpable mass or hepatosplenomegaly.  No palpable hernia.  EXTREMITIES:  Without clubbing, cyanosis or edema.  RECTAL:  Not done. NEURO: A+O X3. Grossly nonfocal.   LABORATORY STUDIES:  WBC of 5.2, hemoglobin 12.5, hematocrit of 36.1.  Electrolytes within normal limits.  BUN 18, creatinine 0.8.  Liver function  studies within normal limits.  OT is 55, PT is 84, creatinine was 0.8,  lipase 48.  Urinalysis negative.   Abdominal films consistent with a partial small-bowel obstruction, cannot  rule out left mid lung zone pneumonia.   IMPRESSION:  1. [age]-year-old female with partial small-bowel obstruction likely      secondary to adhesions.  2. Mildly elevated transaminases, chronic.  3. Left lung infiltrate, new.  4. Status post cholecystectomy and negative intraoperative cholangiogram.  5. History of breast cancer, status post lumpectomy, radiation and      chemotherapy, 2005.  6. Gastroesophageal reflux disease.  7. Irritable bowel syndrome.  8. Constipation.   PLAN:  The patient is admitted to the service of Dr. Collene Mares, Dr. Fuller Plan is  covering.  She will be placed on IV fluids, kept at bowel rest, given  antiemetics and narcotics as needed for control of pain and nausea.  We will  obtain a chest CT on September 24 and Pulmonary consult as well.  Dr. Collene Mares  will resume her care in a.m.  We will consider surgical consultation with  Dr. Bubba Camp, followup abdominal films and for details, please see orders.     ______________________________  Nicoletta Ba, PA-C      Pricilla Riffle. Fuller Plan, MD, [facility]  Electronically Signed    AE/MEDQ  D:  07/19/2006  T:  07/21/2006  Job:  [id number]   cc:   Alyson Locket. Beryle Beams, M.D.  Fax: [phone]   Suszanne Conners, M.D.  Fax: [phone]   QHUTML YYT KPTW, M.D.  Fax: [phone]

## 2011-03-13 NOTE — Op Note (Signed)
NAMEKALEESI, GUYTON                  ACCOUNT NO.:  [id number]   MEDICAL RECORD NO.:  [id number]          PATIENT TYPE:  AMB   LOCATION:  [place]                          FACILITY:  [facility]   PHYSICIAN:  Kathrin Penner, M.D.   DATE OF BIRTH:  [birth year]   DATE OF PROCEDURE:  07/21/2004  DATE OF DISCHARGE:  07/21/2004                                 OPERATIVE REPORT   PREOPERATIVE DIAGNOSIS:  Infected Port-A-Cath.   POSTOPERATIVE DIAGNOSIS:  Infected Port-A-Cath.   PROCEDURE:  Removal of Port-A-Cath.   SURGEON:  Kathrin Penner, M.D.   ASSISTANT:  Nurse.   ANESTHESIA:  MAC.  I used 0.5% Marcaine with epinephrine.   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  At the  same time she underwent placement of a Port-A-Cath so as to facilitate  chemotherapy.  She has subsequently developed an infection at the Port-A-  Cath site and is brought to the operating room now for removal of the  infected Port-A-Cath.  She is aware of the risks and benefits of surgery and  has given consent to same.   PROCEDURE:  Following adequate sedation with the patient positioned  supinely, the right anterior chest is prepped and draped to be included in  the sterile operative field.  The region around the Port-A-Cath is  infiltrated with 0.5% Marcaine with epinephrine and the incision overlying  the Port-A-Cath is extended medially so as to fully expose the Port-A-Cath.  Dissection is carried down to the capsule containing the Port-A-Cath, which  was already partially exposed.  The sutures holding the Port-A-Cath in place  were clipped and removed.  The Port-A-Cath was then extruded from the  pocket.  The area of the tunnel leading up to the subclavian vein on the  right was then identified and an occluding suture placed around the tunnel  prior to removing the catheter.  The catheter was removed without  difficulty  and its entire length was inspected.  The suture occluding the tunnel was  then drawn taut and tied.  There was no evidence of hemorrhage.  The Port-A-  Cath pocket was further debrided of any devitalized material.  The pocket  was then packed with Kling gauze.  The Port-A-Cath itself was then forwarded  for culture.  A sterile dressing was then placed on the wound.  The patient  then removed from the operating room to the recovery room in stable  condition.  She tolerated the procedure well.      Patr   PB/MEDQ  D:  07/29/2004  T:  07/29/2004  Job:  [id number]

## 2011-03-13 NOTE — H&P (Signed)
[name], [name]                  ACCOUNT NO.:  [id number]   MEDICAL RECORD NO.:  [id number]          PATIENT TYPE:  OBV   LOCATION:  4696                         FACILITY:  [facility]   PHYSICIAN:  Virgie Dad. 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  She underwent her lumpectomy  and sentinel lymph node biopsy on September 6.  She is being treated with  Cytoxan and Adriamycin, and received the most recent dose 5 days ago.   She received fluid Sunday, too, as well as a growth factor shot (Neulasta).  She did more or less as usual for the next day or two, but particularly  yesterday evening, and then today, she has been feeling progressively  weaker.  She has had significant tachycardia with heart rates in the 120-140  range, and she had a near syncopal episode today.  She does have some  diarrhea and some nausea, although no vomiting at present.  She is being  admitted for hydration, and very likely, as well, transfusion.   PAST MEDICAL HISTORY:  1.  Significant for migraines.  2.  History of palpitations.   ALLERGIES:  No known drug allergies.   MEDICATIONS:  1.  Valtrex for mouth sore prophylaxis.  2.  Metoprolol.  3.  Effexor.  4.  Hyoscyamine.  5.  Ambien.  6.  Anti-nausea medications.   REVIEW OF SYSTEMS:  As per HPI.  She has actually not vomited, but has found  it very difficult to hydrate herself because everything tastes terrible,  including water.  There have been no unusual headaches, vertigo, visual  changes, sinus problems, sore throat, difficulty swallowing, pain on  swallowing, cough, phlegm production, pleurisy, shortness of breath, or  problems with her urine.  She has not had a period since starting  chemotherapy, and she is having night  sweats/hot flashes at present.  I  should mention that her husband, Jessica Mack, and at least one of her children also  have been having some stomach cramps and loose bowel movements lately, so it  may be that there was either a food problem or a viral problem running  through the family, complicating her reaction to chemotherapy.   PHYSICAL EXAMINATION:  VITAL SIGNS:  Her temperature is 98.0, pulse 93,  respirations 20, blood pressure 114/81.  She has a 98% room air saturation.  HEENT:  The sclerae are anicteric.  The oropharynx is clear.  NECK:  Supple.  There is no peripheral adenopathy.  LUNGS:  No crackles or wheezes.  HEART:  Regular rate and rhythm.  BREASTS:  Deferred.  PICC line in the right arm is intact.  ABDOMEN:  Benign.  There is no peripheral edema.  No focal spinal  tenderness.  NEUROLOGIC:  Nonfocal.   LABORATORY DATA:  Pending.   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   She is being hydrated at present.  I am concerned she may be significantly  anemic and may require transfusion.  She is very reluctant to be transfused,  which is understandable.  I will call her with the lab results, and we will  make a decision whether or not to transfuse, and I will also repeat a CBC in  the morning, because after hydration her hemoglobin may indeed go down  further.  This is a 23-hour observation, and I anticipate discharge  tomorrow.     Gust   GCM/MEDQ  D:  09/06/2004  T:  09/07/2004  Job:  [id number]

## 2011-03-13 NOTE — Discharge Summary (Signed)
[name], Jessica Mack                  ACCOUNT NO.:  [id number]   MEDICAL RECORD NO.:  [id number]          PATIENT TYPE:  OBV   LOCATION:  2878                         FACILITY:  [facility]   PHYSICIAN:  Virgie Dad. Magrinat, M.D.DATE OF BIRTH:  1956/05/26   DATE OF ADMISSION:  09/06/2004  DATE OF DISCHARGE:  09/07/2004                                 DISCHARGE SUMMARY   DISCHARGE DIAGNOSES:  1.  Severe dehydration with pre-syncope.  2.  Anemia.  3.  Breast cancer, receiving chemotherapy.  4.  History of irritable bowel.  5.  Poorly controlled nausea.  6.  Gastroesophageal reflux disease.  7.  History of fever blisters.   PROCEDURE:  Intravenous hydration.   HOSPITAL COURSE:  The patient was admitted and had pre-syncopal state with  an elevated heart rate in the 120 to 140s.  She was hydrated overnight at  200 cc an hour for the first two liters.  The patient received 2025 in and  had 200 out.  After that, she started to be more even and this morning, her  blood pressure has settled at 111/71 and her heart rate has come down to 85.  Her respiratory rate is 20.  Her sats are 97%.   The patient's admission hemoglobin was 9.8.  It dropped with hydration to  8.4.  She is having no symptoms currently from this particularly no  faintness or chest pain or pressure or sedation of shortness of breath.  Accordingly, we are not proceeding to transfusion, instead we are giving  Aranesp today.   The patient's white blood cell count was 38.3 on admission, this is  secondary to Neulasta.  Her white cell count has already dropped to half  that value by today at 16.4.  Platelet count was normal.  Other laboratories  included an [place] of 28.6 and an Estradiol of less than 10.  The reticulocyte  count was only 44.   CONDITION ON DISCHARGE:  Much improved.   DISCHARGE MEDICATIONS:  Metoprolol 50 mg b.i.d., Effexor XL 75 mg daily,  Valtrex 500 mg daily, Nexium 40 mg daily, Hyoscyamine 0.1 to 5 mg daily,  either Zofran, Phenergan or Compazine or Ativan or any combination as-needed  for nausea and Ambien or Lunesta as-needed for insomnia.  Pain management is  not applicable.   ACTIVITY:  Unrestricted.   DIET:  Unrestricted. She needs to drink at least two quarts a day.   WOUND CARE:  Routine PICC line care which the patient does at home with her  husband's assistance.   SPECIAL INSTRUCTIONS:  She will call for fever, pain, constipation or any  other issues.   FOLLOW UP:  She will see me as scheduled.     [place]   GCM/MEDQ  D:  09/07/2004  T:  09/07/2004  Job:  [id number]

## 2011-03-13 NOTE — Letter (Signed)
September 21, 2006    Annie Main A. Forde Dandy, M.D.  [address]  [place]  [place] [address]   RE:  Jessica Mack, Jessica Mack  MRN:  [id number]  /  DOB:  12/07/1955   Dear Richardson Landry,   It was a pleasure to see Dr. Margaretha Glassing, yesterday, in the office.  She  speaks exceedingly fondly of you.   As you know she is a 55 year old woman who is now a couple of years  status post lumpectomy treatment for infiltrating breast cancer, that  was treated with Adriamycin and Cytoxan, followed by Taxol, the latter  of which was complicated by a neuropathy that has left her disabled from  her gynecological practice.  She was having symptoms of dizziness during  that therapy, and because of that, underwent an echocardiogram to  followup the initial pre-chemotherapy electrocardiogram that, in the  fall of 2005, was normal.   The concern that she has had is that her heart rate has been fast for  some time.  She has also had some degree of hypertension.  She has been  treated with beta blockers with doses up to 150 mg, metoprolol (taking  the short-acting formulation) once a day (home remedy) regimen, but has  had persistent tachycardia with rates in the 90 to 100 range.   Notwithstanding the above, she has had no impairment of exercise  tolerance.  Notwithstanding the fact also that she has lost 35 pounds  she was exceedingly active prior to her cancer and remains even more  active now having just finished, for example, a 60-mile, 3-day fund  raiser for cancer.  She has no symptoms of lightheadedness or syncope.   Her past medical history is notable for 1) Migraines, 2) IBS, 3) GE  reflux disease, 4) Elevated fasting blood sugars, although her other  parameters of diabetes are negative; and 5) Neuropathic pain is noted  related to the Taxol therapy.   Her past surgical history is notable for 1) Breast surgery in 1988, 2) C-  sections, 3) Laparoscopic cholecystectomy, 4) Left breast lumpectomy.   Her current  medications include Nexium, tamoxifen, Omnicor 4, niacin  500, aspirin 325, metoprolol 100, Ambien, and clonazepam for sleep.   She has no known drug allergies.   She is married.  She has two children, a 67 year old boy and a16-year-  old girl.  She does not use cigarettes or recreational drugs.  She does  use alcohol a couple of times a week.  She is very active doing cardio 3-  4x a week and strength training once or twice a week.   Surprisingly today on examination, Richardson Landry, her heart rate was 66.  Her  blood pressure is 118/62, and her weight is 138.  Her HEENT exam  demonstrated no icterus or xanthoma.  The neck veins were flat.  The  carotids were brisk and full bilaterally without bruits.  The back was  without kyphosis or scoliosis.  Lungs were clear.  Heart sounds were  regular without murmurs or gallops.  The abdomen was soft with active  bowel sounds, without midline pulsations or hepatomegaly.  Femoral  pulses were 2+ and distal pulses were intact and there was no clubbing,  cyanosis, or edema.  The neurological exam was grossly normal.  The skin  was warm and dry.   Electrocardiogram dated yesterday demonstrated sinus rhythm at 66  beats/minute and the electrocardiogram was otherwise misplaced, but it  was not otherwise notable as best as I can recall.  I am looking for it  still.   Review of the ultrasounds was also notable that she had mild left  ventricular hypertrophy with left ventricular wall thickness of greater  than 11 mm at her pre-chemotherapy, and measured at 12 mm at her inter-  chemotherapy ultrasound 2 years ago.  This was news to her.   IMPRESSION:  1. History of resting tachycardia, now normal, but on metoprolol short-      acting once a day.  2. History of hypertension with history of left ventricular      hypertrophy by ultrasound.  3. Elevated fasting blood sugar with other normal glucose      measurements.  4. History of breast cancer status post  left lumpectomy, status post      chemotherapy.  5. Neuropathy related to the above, resulting in surgical disability.   Richardson Landry, Dr. Tamala Julian was a delight to meet today.  She has been through a  lot as was evidenced by tears as she was telling the story of being 2  years out from her chemotherapy.   As relates to her resting bradycardia.  I think, a Holter monitor  showing some degree of diurnal variation with heart rates that are  relatively slower would be enough to obviate any significant concern  about the potential development of tachycardia induced cardiomyopathy.  Why it is that her heart rate is fast is not clear.  Certainly there is  a Gaussian distribution of heart rates, but it is a little bit  bothersome that she has it in the context of her hypertension.  I did  notice that her TSH years ago was borderline low, but apparently she has  had numerous repeats of this and they have all been normal.  The other  thing that was of concern to her was the left ventricular hypertrophy.  You are the glucose expert, but there have been articles more recently,  again, raising the issue of beta blockers and glucose intolerance; and  studies like Life might raise the possibility of whether alternative  antihypertensive therapy would be beneficial for at least her LPH and  her blood pressure and maybe very low-dose beta blockers if necessary at  all, for heart rate control.   I have suggested to her that we repeat her echocardiogram.  We will plan  to do so as well as to get a 24-hour Holter monitor to look for the  variation heart rate over a 24 hour period.   It was a pleasure to meet your daughter the other day.  Thanks for  asking [place] to see Dr. Tamala Julian.    Sincerely,      Deboraha Sprang, MD, [facility]  Electronically Signed    SCK/MedQ  DD: 09/22/2006  DT: 09/22/2006  Job #: [id number]

## 2011-03-13 NOTE — Letter (Signed)
November 08, 2006    Annie Main A. Forde Dandy, M.D.  [address]  [place], [place] [address]   RE:  VENTURA, LEGGITT  MRN:  [id number]  /  DOB:  31-Jul-1956   Dear Richardson Landry:   Dr. Thompson Caul echo and Holter monitor were reviewed and I spoke with her  about them today.   Her Holter monitor looks terrific with an average heart rate of 75, max  heart rate is about 120 and the means of almost every hour well below  100.   Her echo was notable for normal wall dimensions with a septal size of 9  and a posterior wall of 6-1/2.   Her blood pressures are better and you know she had been on the beta-  blocker because of hypertension, resting tachycardia and also her pre-  flute performance calming.   What we discussed on the phone today was that we need her beta-blocker  to off seeing how she did with her blood pressure and her heart rate.   In the event that her blood pressure needs augmented control, I thought  based on the Life Trial maybe an ACE or an ARB would be more  appropriate. She could use her beta-blocker p.r.n. before a performance.  She was agreeable with that. I asked that she follow up with you.   If there is anything further that I can do, please do not hesitate to  contact me.    Sincerely,      Deboraha Sprang, MD, [facility]  Electronically Signed    SCK/MedQ  DD: 11/08/2006  DT: 11/08/2006  Job #: [id number]   CC:    Ms. Margaretha Glassing

## 2011-03-13 NOTE — Op Note (Signed)
NAME:  Jessica Mack, Jessica Mack                            ACCOUNT NO.:  [id number]   MEDICAL RECORD NO.:  [id number]                   PATIENT TYPE:  AMB   LOCATION:  ENDO                                 FACILITY:  [facility]   PHYSICIAN:  Nelwyn Salisbury, M.D.               DATE OF BIRTH:  02-20-1956   DATE OF PROCEDURE:  09/26/2003  DATE OF DISCHARGE:                                 OPERATIVE REPORT   PROCEDURE PERFORMED:  Screening colonoscopy.   ENDOSCOPIST:  Juanita Craver, M.D.   INSTRUMENT USED:  Olympus video colonoscope.   INDICATIONS FOR PROCEDURE:  The patient is a 55 year old white female with  family history of colon cancer in her father.  Rule out colonic polyps,  masses, etc.   PREPROCEDURE PREPARATION:  Informed consent was procured from the patient.  The patient was fasted for eight hours prior to the procedure and prepped  with a bottle of MiraLax the night prior to the procedure.   PREPROCEDURE PHYSICAL:  The patient had stable vital signs.  Neck supple.  Chest clear to auscultation.  S1 and S2 regular.  Abdomen soft with normal  bowel sounds.   DESCRIPTION OF PROCEDURE:  The patient was placed in left lateral decubitus  position and sedated with 90 mg of Demerol and 9 mg of Versed in slow  incremental doses.  Once the patient was adequately sedated and maintained  on low flow oxygen and continuous cardiac monitoring, the Olympus video  colonoscope was advanced from the rectum to the cecum and terminal ileum  without difficulty.  The appendicular orifice and ileocecal valve were  clearly visualized and photographed.  No masses, polyps, erosions,  ulcerations or diverticula were seen.  The terminal ileum appeared healthy  without lesions.  Retroflexion in the rectum revealed no abnormalities.   IMPRESSION:  Normal colonoscopy up to the terminal ileum.   RECOMMENDATIONS:  1. Repeat colorectal cancer screening is recommended in the next five years     unless the patient  develops any abnormal symptoms in the interim.  2. Outpatient follow-up as need arises in the future.                                               Nelwyn Salisbury, M.D.   JNM/MEDQ  D:  09/26/2003  T:  09/26/2003  Job:  [id number]   cc:   Suszanne Conners, M.D.  [address], [place]  [place]  [place] [address]  Fax: [phone]

## 2011-03-13 NOTE — Op Note (Signed)
NAME:  Jessica Mack, Jessica Mack                            ACCOUNT NO.:  [id number]   MEDICAL RECORD NO.:  [id number]                   PATIENT TYPE:  AMB   LOCATION:  DSC                                  FACILITY:  [facility]   PHYSICIAN:  Kathrin Penner, M.D.                DATE OF BIRTH:  08/17/1956   DATE OF PROCEDURE:  07/01/2004  DATE OF DISCHARGE:                                 OPERATIVE REPORT   PREOPERATIVE DIAGNOSIS:  Carcinoma of the left breast.   POSTOPERATIVE DIAGNOSIS:  Carcinoma of the left breast.   PROCEDURE:  1.  Left lumpectomy.  2.  Sentinel lymph node biopsy.  3.  Port-A-Cath implantation.   SURGEON:  Kathrin Penner, M.D.   ASSISTANT:  Nurse.   ANESTHESIA:  General.   INDICATIONS FOR PROCEDURE:  The patient is a 55 year old female with a  recently-discovered left-sided breast carcinoma located at the 12 o'clock  axis.  This has been diagnosed by a core needle biopsy done under ultrasound  guidance.  The lesion measures approximately between 1.2 and 1.8 cm by  radiographic measurement.  Because of its potential size, the patient has  opted to have a Port-A-Cath placed for the facilitation of chemotherapy in  the postoperative period.  The risks and potential benefits of surgery have  been fully discussed with her.  All questions answered and a consent  obtained.  Prior to coming to the operating room, a needle localization was  performed for localizing the lesion.  Also an injection of filtered sulfur  colloid was made in the circum-areolar area for localization of the sentinel  node.   DESCRIPTION OF PROCEDURE:  Following the induction of satisfactory general  anesthesia, I injected 4 mL of Lymphazurin into the subdermal region around  the nipple, and this was massaged into the lymphatics.  Then the anterior  chest was prepped and draped, to be included in the sterile operative field.  The region, including the localizing needle was removed with an elliptical  incision which was carried transversely across the breast and deepened  through the skin and subcutaneous tissue, carrying a large wedge of breast  tissue in an increasingly wide cone all the way down to the pectoralis  fascia.  This was removed in its entirety and labeled with the 12 o'clock  axis being labeled with a long suture, and the 3 o'clock axis being labeled  with a double suture.  This was then forwarded for radiologic evaluation.  The radiologic evaluation confirmed the area of distortion in the region of  the needle.  This was then forwarded for pathologic evaluation.  On  pathologic evaluation, the margins were grossly clear of tumor, without any  evidence of tumor on touch prep.  The closest margin measured grossly was  0.6 cm.  Attention was then turned to the left axilla, where a transverse incision  was made over a hot  spot, using the Neo probe.  The incision was deepened  through the skin and subcutaneous tissue,  down into the axilla using the  Neo probe to guide, with dissection to the area of the sentinel node.  The  sentinel node was located.  It was noted to be dyed brilliantly blue.  This  was dissected free from the axilla and forwarded for pathologic evaluation.  The area of the sentinel node actually included one large and one small  lymph node.  These were both hot and blue and were forwarded for pathologic  evaluation.  The evaluation of the lymph nodes on touch prep showed no  evidence of metastatic disease.  Hemostasis was secured, both within the  breast and within the axilla.  The sponge, instrument and sharp counts were  verified.  The breast wound was closed in two layers using interrupted #3-0  Vicryl and a running #5-0 Monocryl suture.  The axillary wound was then  closed with a #3-0 Vicryl and a running #5-0 Monocryl suture.  Attention was then turned to the right shoulder, which was already prepped  and draped, to be included in the sterile operative  field.  The gown and  gloves were then changed.  The operative site was prepared for insertion of  a Port-A-Cath.  A right subclavian stick was made into the right subclavian  vein, with threading of the guide wire into the central venous system, and  into the atrium under fluoroscopic guidance.  A pocket was created on the  anterior chest wall, and from the pocket a tunnel was created up to the  shoulder, where the Silastic catheter was pulled through.  I used a Cook  introducer dilator to insert into the central venous system under  fluoroscopic guidance.  The guide wire was then removed and the Silastic  catheter was threaded in through the introducer, and positioned at the  atrial/vena caval junction.  Inflow of heparinized saline and blood return  were noted to be excellent.  The external portion of the Port-A-Cath was  then trimmed and attached to the Port-A-Cath reservoir which was flushed  with heparinized saline.  The flushing through the reservoir was without  difficulty, and there was excellent blood return.  The reservoir was then  sutured into the pocket, which was created on the anterior chest wall to the  pectoralis fascia.  Again inflow of heparinized saline and blood return were  noted to be excellent.  The Port-A-Cath reservoir was well-seated within the  pocket.  The sponge, instrument and sharp counts were verified and the  pocket was then closed in two layers with #3-0 Vicryl and a #4-0 Monocryl.  The shoulder wound was closed with #3-0 Vicryl suture.  The final review of fluoroscopy showed the Port-a-Cath to be well-seated.  No kinks, bands, or unusual turns within the flow of the Port-A-Cath, and  the tip of the Silastic catheter was in the vena cava at the atrial/vena  caval junction.  All wounds were then reinforced with Steri-Strips, after  all the sponge, instrument and sharp counts were again verified.  The  anesthetic was reversed. The patient was removed from  the operating room to the recovery room in  stable condition.  She tolerated the procedure well.  Kathrin Penner, M.D.    PB/MEDQ  D:  07/01/2004  T:  07/01/2004  Job:  [id number]   cc:   Caren Griffins P. Romine, M.D.  [address], [place]. [place]  [place] [address]  Fax: [place]. Laurann [place], M.D.  [address] [place]  [place] [address]  Fax: [phone]

## 2011-03-13 NOTE — Op Note (Signed)
NAME:  Jessica Mack, Jessica Mack                            ACCOUNT NO.:  [id number]   MEDICAL RECORD NO.:  [id number]                   PATIENT TYPE:  AMB   LOCATION:  DSC                                  FACILITY:  [facility]   PHYSICIAN:  Kathrin Penner, M.D.                DATE OF BIRTH:  08-10-1956   DATE OF PROCEDURE:  07/01/2004  DATE OF DISCHARGE:                                 OPERATIVE REPORT   INCOMPLETE:   PREOPERATIVE DIAGNOSIS:  Carcinoma of the left breast.   POSTOPERATIVE DIAGNOSIS:  Carcinoma of the left breast.   PROCEDURE:  1.  Left lumpectomy.  2.  Sentinel lymph node biopsy.  3.  Port-A-Cath implant.   SURGEON:  Kathrin Penner, M.D.   ASSISTANT:  Nurse.   ANESTHESIA:  General.   INDICATIONS FOR PROCEDURE:  The patient is a 55 year old female with a  recently-diagnosed left-sided breast cancer, which measures approximately  1.2 to 1.3 cm in size on ultrasound.  She comes to the operating room now  for a lumpectomy and a sentinel lymph node biopsy, because of the possible  size greater than 1.1 cm.  She has also opted to have a Port-A-Cath placed,  for the possibility of chemotherapy.  All the risks and potential benefits  of surgery have been fully discussed.  All questions answered and a consent  for surgery obtained.   DESCRIPTION OF PROCEDURE:  Following the induction of satisfactory general  anesthesia with the patient positioned supinely, the anterior chest on the  left and right, including the breast, were prepped and draped, to be  included in the sterile operative field.  The Lymphazurin dye was injected  in the circum-areolar space, so as to                                               Kathrin Penner, M.D.    PB/MEDQ  D:  07/01/2004  T:  07/01/2004  Job:  [id number]

## 2011-06-01 ENCOUNTER — Ambulatory Visit
Admission: RE | Admit: 2011-06-01 | Discharge: 2011-06-01 | Disposition: A | Discharge status: Home / Self Care | Payer: BC Managed Care – PPO | Source: Ambulatory Visit | Attending: Endocrinology | Admitting: Endocrinology

## 2011-06-01 DIAGNOSIS — M8580 Other specified disorders of bone density and structure, unspecified site: Secondary | ICD-10-CM

## 2011-06-03 ENCOUNTER — Other Ambulatory Visit: Payer: Self-pay | Admitting: Oncology

## 2011-06-03 DIAGNOSIS — Z9889 Other specified postprocedural states: Secondary | ICD-10-CM

## 2011-06-26 ENCOUNTER — Other Ambulatory Visit: Payer: Self-pay | Admitting: Oncology

## 2011-06-26 DIAGNOSIS — C50919 Malignant neoplasm of unspecified site of unspecified female breast: Secondary | ICD-10-CM

## 2011-07-09 ENCOUNTER — Ambulatory Visit
Admission: RE | Admit: 2011-07-09 | Discharge: 2011-07-09 | Disposition: A | Discharge status: Home / Self Care | Payer: BC Managed Care – PPO | Source: Ambulatory Visit | Attending: Oncology | Admitting: Oncology

## 2011-07-09 DIAGNOSIS — Z9889 Other specified postprocedural states: Secondary | ICD-10-CM

## 2011-07-13 ENCOUNTER — Ambulatory Visit (HOSPITAL_COMMUNITY)
Admission: RE | Admit: 2011-07-13 | Discharge: 2011-07-13 | Disposition: A | Discharge status: Home / Self Care | Payer: BC Managed Care – PPO | Source: Ambulatory Visit | Attending: Oncology | Admitting: Oncology

## 2011-07-13 DIAGNOSIS — K7689 Other specified diseases of liver: Secondary | ICD-10-CM | POA: Insufficient documentation

## 2011-07-13 DIAGNOSIS — C50919 Malignant neoplasm of unspecified site of unspecified female breast: Secondary | ICD-10-CM | POA: Insufficient documentation

## 2011-07-13 MED ORDER — GADOBENATE DIMEGLUMINE 529 MG/ML IV SOLN
13.0000 mL | Freq: Once | INTRAVENOUS | Status: AC | PRN
  Administered 2011-07-13: 13 mL via INTRAVENOUS

## 2011-07-17 ENCOUNTER — Other Ambulatory Visit: Payer: Self-pay | Admitting: Oncology

## 2011-07-17 ENCOUNTER — Encounter (HOSPITAL_BASED_OUTPATIENT_CLINIC_OR_DEPARTMENT_OTHER): Payer: BC Managed Care – PPO | Admitting: Oncology

## 2011-07-17 DIAGNOSIS — Z170 Estrogen receptor positive status [ER+]: Secondary | ICD-10-CM

## 2011-07-17 DIAGNOSIS — C50219 Malignant neoplasm of upper-inner quadrant of unspecified female breast: Secondary | ICD-10-CM

## 2011-07-17 LAB — CBC WITH DIFFERENTIAL/PLATELET
BASO%: 0.2 % (ref 0.0–2.0)
Basophils Absolute: 0 10*3/uL (ref 0.0–0.1)
EOS%: 3.8 % (ref 0.0–7.0)
Eosinophils Absolute: 0.1 10*3/uL (ref 0.0–0.5)
HCT: 39.1 % (ref 34.8–46.6)
HGB: 13.9 g/dL (ref 11.6–15.9)
LYMPH%: 34.3 % (ref 14.0–49.7)
MCH: 34.2 pg — ABNORMAL HIGH (ref 25.1–34.0)
MCHC: 35.5 g/dL (ref 31.5–36.0)
MCV: 96.3 fL (ref 79.5–101.0)
MONO#: 0.4 10*3/uL (ref 0.1–0.9)
MONO%: 11.5 % (ref 0.0–14.0)
NEUT#: 2 10*3/uL (ref 1.5–6.5)
NEUT%: 50.2 % (ref 38.4–76.8)
Platelets: 179 10*3/uL (ref 145–400)
RBC: 4.06 10*6/uL (ref 3.70–5.45)
RDW: 12.7 % (ref 11.2–14.5)
WBC: 3.9 10*3/uL (ref 3.9–10.3)
lymph#: 1.3 10*3/uL (ref 0.9–3.3)

## 2011-07-17 LAB — COMPREHENSIVE METABOLIC PANEL
ALT: 31 U/L (ref 0–35)
AST: 30 U/L (ref 0–37)
Albumin: 4.1 g/dL (ref 3.5–5.2)
Alkaline Phosphatase: 83 U/L (ref 39–117)
BUN: 15 mg/dL (ref 6–23)
CO2: 28 mEq/L (ref 19–32)
Calcium: 9.3 mg/dL (ref 8.4–10.5)
Chloride: 103 mEq/L (ref 96–112)
Creatinine, Ser: 0.56 mg/dL (ref 0.50–1.10)
Glucose, Bld: 111 mg/dL — ABNORMAL HIGH (ref 70–99)
Potassium: 3.8 mEq/L (ref 3.5–5.3)
Sodium: 140 mEq/L (ref 135–145)
Total Bilirubin: 0.4 mg/dL (ref 0.3–1.2)
Total Protein: 7 g/dL (ref 6.0–8.3)

## 2011-07-22 ENCOUNTER — Encounter (HOSPITAL_BASED_OUTPATIENT_CLINIC_OR_DEPARTMENT_OTHER): Payer: BC Managed Care – PPO | Admitting: Oncology

## 2011-07-22 ENCOUNTER — Other Ambulatory Visit: Payer: Self-pay | Admitting: Oncology

## 2011-07-22 DIAGNOSIS — C50219 Malignant neoplasm of upper-inner quadrant of unspecified female breast: Secondary | ICD-10-CM

## 2011-07-22 DIAGNOSIS — Z9889 Other specified postprocedural states: Secondary | ICD-10-CM

## 2011-07-22 DIAGNOSIS — Z170 Estrogen receptor positive status [ER+]: Secondary | ICD-10-CM

## 2011-07-24 LAB — VITAMIN D 25 HYDROXY (VIT D DEFICIENCY, FRACTURES): Vit D, 25-Hydroxy: 77 ng/mL (ref 30–89)

## 2011-07-24 LAB — HEMOGLOBIN A1C
Hgb A1c MFr Bld: 5.1 % (ref <5.7)
Mean Plasma Glucose: 100 mg/dL (ref <117)

## 2011-07-24 LAB — VITAMIN B12: Vitamin B-12: 709 pg/mL (ref 211–911)

## 2011-07-24 LAB — CANCER ANTIGEN 27.29: CA 27.29: 15 U/mL (ref 0–39)

## 2011-07-24 LAB — FOLATE: Folate: 17.8 ng/mL

## 2011-07-28 LAB — VITAMIN B1: Vitamin B1 (Thiamine): 16 nmol/L (ref 9–44)

## 2011-11-27 ENCOUNTER — Encounter (INDEPENDENT_AMBULATORY_CARE_PROVIDER_SITE_OTHER): Payer: Self-pay | Admitting: General Surgery

## 2011-11-27 DIAGNOSIS — Z853 Personal history of malignant neoplasm of breast: Secondary | ICD-10-CM | POA: Insufficient documentation

## 2011-12-01 ENCOUNTER — Encounter (INDEPENDENT_AMBULATORY_CARE_PROVIDER_SITE_OTHER): Payer: Self-pay | Admitting: General Surgery

## 2011-12-02 ENCOUNTER — Encounter (INDEPENDENT_AMBULATORY_CARE_PROVIDER_SITE_OTHER): Payer: Self-pay | Admitting: Surgery

## 2011-12-02 ENCOUNTER — Ambulatory Visit (INDEPENDENT_AMBULATORY_CARE_PROVIDER_SITE_OTHER): Payer: BC Managed Care – PPO | Admitting: Surgery

## 2011-12-02 VITALS — BP 124/82 | HR 92 | Temp 97.2°F | Resp 18 | Ht 64.0 in | Wt 148.0 lb

## 2011-12-02 DIAGNOSIS — Z853 Personal history of malignant neoplasm of breast: Secondary | ICD-10-CM

## 2011-12-02 NOTE — Progress Notes (Signed)
NAME: Jessica Mack       DOB: [birth year]           DATE: 12/02/2011       MRN: [id number]   RANEE PEASLEY is a 56 y.o.Marland Kitchenfemale who presents for routine followup of her Left breast cancer diagnosed in 2005 and treated with lumpectomy and radiation,now on anti-estrogen. She has no problems or concerns on either side.  PFSH: She has had no significant changes since the last visit here.  ROS: There have been no significant changes since the last visit here  EXAM: General: The patient is alert, oriented, generally healty appearing, NAD. Mood and affect are normal.  Breasts:  Right is wnl. Left shows continued firmness ant lumpectomy site, but no suspicious areas  Lymphatics: She has no axillary or supraclavicular adenopathy on either side.  Extremities: Full ROM of the surgical side with no lymphedema noted.  Data Reviewed: MRI in September negative  Impression: Doing well, with no evidence of recurrent cancer or new cancer  Plan: Will continue to follow up on an annual basis here.

## 2012-06-20 ENCOUNTER — Other Ambulatory Visit: Payer: Self-pay | Admitting: Oncology

## 2012-06-20 ENCOUNTER — Telehealth: Payer: Self-pay | Admitting: Oncology

## 2012-06-20 DIAGNOSIS — C50919 Malignant neoplasm of unspecified site of unspecified female breast: Secondary | ICD-10-CM

## 2012-06-20 NOTE — Telephone Encounter (Signed)
S/w the pt and she is aware of her sept 2013 appt calendar

## 2012-07-07 ENCOUNTER — Other Ambulatory Visit (HOSPITAL_BASED_OUTPATIENT_CLINIC_OR_DEPARTMENT_OTHER): Payer: BC Managed Care – PPO | Admitting: Lab

## 2012-07-07 DIAGNOSIS — C50919 Malignant neoplasm of unspecified site of unspecified female breast: Secondary | ICD-10-CM

## 2012-07-07 LAB — CBC & DIFF AND RETIC
BASO%: 0.6 % (ref 0.0–2.0)
Basophils Absolute: 0 10*3/uL (ref 0.0–0.1)
EOS%: 2.5 % (ref 0.0–7.0)
Eosinophils Absolute: 0.1 10*3/uL (ref 0.0–0.5)
HCT: 40.2 % (ref 34.8–46.6)
HGB: 14.1 g/dL (ref 11.6–15.9)
Immature Retic Fract: 3.8 % (ref 1.60–10.00)
LYMPH%: 29.3 % (ref 14.0–49.7)
MCH: 32 pg (ref 25.1–34.0)
MCHC: 35.1 g/dL (ref 31.5–36.0)
MCV: 91.4 fL (ref 79.5–101.0)
MONO#: 0.4 10*3/uL (ref 0.1–0.9)
MONO%: 8.5 % (ref 0.0–14.0)
NEUT#: 3.1 10*3/uL (ref 1.5–6.5)
NEUT%: 59.1 % (ref 38.4–76.8)
Platelets: 210 10*3/uL (ref 145–400)
RBC: 4.4 10*6/uL (ref 3.70–5.45)
RDW: 12 % (ref 11.2–14.5)
Retic %: 1.91 % (ref 0.70–2.10)
Retic Ct Abs: 84.04 10*3/uL (ref 33.70–90.70)
WBC: 5.2 10*3/uL (ref 3.9–10.3)
lymph#: 1.5 10*3/uL (ref 0.9–3.3)
nRBC: 0 % (ref 0–0)

## 2012-07-07 LAB — COMPREHENSIVE METABOLIC PANEL (CC13)
ALT: 53 U/L (ref 0–55)
AST: 35 U/L — ABNORMAL HIGH (ref 5–34)
Albumin: 4 g/dL (ref 3.5–5.0)
Alkaline Phosphatase: 99 U/L (ref 40–150)
BUN: 20 mg/dL (ref 7.0–26.0)
CO2: 21 mEq/L — ABNORMAL LOW (ref 22–29)
Calcium: 9.4 mg/dL (ref 8.4–10.4)
Chloride: 105 mEq/L (ref 98–107)
Creatinine: 0.8 mg/dL (ref 0.6–1.1)
Glucose: 106 mg/dl — ABNORMAL HIGH (ref 70–99)
Potassium: 3.8 mEq/L (ref 3.5–5.1)
Sodium: 138 mEq/L (ref 136–145)
Total Bilirubin: 1 mg/dL (ref 0.20–1.20)
Total Protein: 6.9 g/dL (ref 6.4–8.3)

## 2012-07-11 ENCOUNTER — Other Ambulatory Visit: Payer: BC Managed Care – PPO | Admitting: Lab

## 2012-07-12 ENCOUNTER — Ambulatory Visit
Admission: RE | Admit: 2012-07-12 | Discharge: 2012-07-12 | Disposition: A | Discharge status: Home / Self Care | Payer: BC Managed Care – PPO | Source: Ambulatory Visit | Attending: Oncology | Admitting: Oncology

## 2012-07-12 DIAGNOSIS — Z9889 Other specified postprocedural states: Secondary | ICD-10-CM

## 2012-07-15 ENCOUNTER — Ambulatory Visit (HOSPITAL_BASED_OUTPATIENT_CLINIC_OR_DEPARTMENT_OTHER): Payer: BC Managed Care – PPO | Admitting: Oncology

## 2012-07-15 ENCOUNTER — Telehealth: Payer: Self-pay | Admitting: *Deleted

## 2012-07-15 ENCOUNTER — Other Ambulatory Visit: Payer: BC Managed Care – PPO | Admitting: Lab

## 2012-07-15 VITALS — BP 136/95 | HR 78 | Temp 98.5°F | Resp 20 | Ht 64.0 in | Wt 151.7 lb

## 2012-07-15 DIAGNOSIS — C50219 Malignant neoplasm of upper-inner quadrant of unspecified female breast: Secondary | ICD-10-CM

## 2012-07-15 DIAGNOSIS — Z170 Estrogen receptor positive status [ER+]: Secondary | ICD-10-CM

## 2012-07-15 DIAGNOSIS — Z853 Personal history of malignant neoplasm of breast: Secondary | ICD-10-CM

## 2012-07-15 MED ORDER — TAMOXIFEN CITRATE 20 MG PO TABS: 20.0000 mg | ORAL_TABLET | Freq: Every day | ORAL | Status: DC

## 2012-07-15 NOTE — Progress Notes (Signed)
ID: Jessica Mack   DOB: 01/13/1956  MR#: [id number]  DXI#:[id number]  PCP: Reynold Bowen GYN: Edwinna Areola SU: Osborn Coho OTHER MD: Netty Starring, Morene Antu, Pryor Montes  HISTORY OF PRESENT ILLNESS: Jessica Mack palpated a mass in her left breast while showering 06/2000.  She proceeded to [facility] the same [facility], and this showed, as compared to prior, a new asymmetric density in the superior aspect of the left breast, which was soft but palpable.  Ultrasound showed a vague area of persistent shadowing measuring approximately 1.3 cm.   Biopsy was performed the same day and showed ([id number]) an infiltrating ductal carcinoma with focal lobular features.  There was no obvious lymphovascular invasion.  The greatest extent of tumor in the biopsy was 1.3 cm.    The patient saw Dr. Ronnie Derby on 09/02 and had bilateral breast MRIs the same day.  The right breast was negative.  In the left breast there was an oval mass measuring up to 1.8 cm with no lower axillary adenopathy noted. Accordingly, the patient proceeded to left lumpectomy and axillary lymph node dissection as well as port placement 07/01/04.  Results of this procedure and her subsequent history are as detailed below.   INTERVAL HISTORY: Jessica Mack returns today for followup of her breast cancer. She is doing well overall, and the interval history is chiefly significant for her having taken a recent trip to [place], which did result in some Travelers diarrhea problems, which have now resolved.  REVIEW OF SYSTEMS: She was started on Crestor, at a very low dose, and this apparently raised her liver function tests. That medication has now been stopped, and repeat LFTs as noted below are unremarkable. Her peripheral neuropathy is essentially unchanged, and continues to be disabling. She has mild lower back pain. Vaginal dryness continues to be an issue. Otherwise a detailed review of systems today was noncontributory.  PAST MEDICAL  HISTORY: Past Medical History  Diagnosis Date  . Peripheral neuropathy   . Hypertriglyceridemia   . History of breast cancer   . Blood transfusion   . GERD (gastroesophageal reflux disease)   . Ulcer   . Tongue dysplasia   . Irritable bowel syndrome   . Migraines   . Hypertension     PAST SURGICAL HISTORY: Past Surgical History  Procedure Date  . Wrist surgery 1988    right, DeQuervains  . Cesarean section 1991, 1993    x2  . Laparoscopic cholecystectomy 2003  . Breast lumpectomy 06/2004    left, with port placement  . Ankle surgery 07/2007    left, revascularization  . Shoulder arthroscopy 10/2005    right  . Tongue surgery 2012, 2013    FAMILY HISTORY Family History  Problem Relation Age of Onset  . Asthma Brother   . Asthma Sister   . Cancer Mother     melanoma  . Cancer Father     colon  Both parents are alive. Her father had a colon polyp removed remotely.  Her mother has some hypercholesterolemia.  There is no history of breast or ovarian cancer in the family.  A 8 year old sister has some asthma.  A 34 year old brother has some weight problems.  GYNECOLOGIC HISTORY: GX P2. 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 Her husband Elta [place] is a Chief Executive Officer, Company secretary, and businessman. Daughter Jessica Mack is in college studying fine arts/writing, philosophy, and  may go to work for [employer] for [employer]. Son Jessica Mack is attending [employer], in Engineer, production  ADVANCED DIRECTIVES: in place  HEALTH MAINTENANCE: History  Substance Use Topics  . Smoking status: Never Smoker   . Smokeless tobacco: Not on file  . Alcohol Use: Yes     Colonoscopy: [facility]  PAP:  Bone density:  Lipid panel:  Allergies  Allergen Reactions  . Crestor (Rosuvastatin)     Liver function elevated     Current Outpatient Prescriptions  Medication Sig Dispense Refill  . aspirin  81 MG tablet Take 81 mg by mouth daily. 2 tablets daily      . clonazePAM (KLONOPIN) 1 MG tablet Take 1 mg by mouth 2 (two) times daily as needed.      Marland Kitchen NIACIN ER PO Take 1,500 mg by mouth daily.      . Omega-3-acid Ethyl Esters (LOVAZA PO) Take 2 g by mouth 2 (two) times daily.       Marland Kitchen zolpidem (AMBIEN CR) 12.5 MG CR tablet Take 6.25 mg by mouth at bedtime as needed.       Marland Kitchen aspirin 325 MG tablet Take 325 mg by mouth daily.      Marland Kitchen docusate sodium (COLACE) 100 MG capsule Take 100 mg by mouth 2 (two) times daily.      . Multiple Vitamins-Minerals (MULTIVITAMIN PO) Take 1 tablet by mouth daily. With calcium      . tamoxifen (NOLVADEX) 20 MG tablet Take 1 tablet (20 mg total) by mouth daily.  90 tablet  12    OBJECTIVE: Middle-aged white woman who appears well Filed Vitals:   07/15/12 0957  BP: 136/95  Pulse: 78  Temp: 98.5 F (36.9 C)  Resp: 20     Body mass index is 26.04 kg/(m^2).    ECOG FS: 1  Sclerae unicteric Oropharynx clear No cervical or supraclavicular adenopathy Lungs no rales or rhonchi Heart regular rate and rhythm Abd benign MSK no focal spinal tenderness, no peripheral edema Neuro: nonfocal Breasts: The right breast is unremarkable. The left breast is status post lumpectomy and radiation. There is no evidence of local recurrence. The left axilla is benign.  LAB RESULTS: Lab Results  Component Value Date   WBC 5.2 07/07/2012   NEUTROABS 3.1 07/07/2012   HGB 14.1 07/07/2012   HCT 40.2 07/07/2012   MCV 91.4 07/07/2012   PLT 210 07/07/2012      Chemistry      Component Value Date/Time   NA 138 07/07/2012 1505   NA 140 07/17/2011 1455   K 3.8 07/07/2012 1505   K 3.8 07/17/2011 1455   CL 105 07/07/2012 1505   CL 103 07/17/2011 1455   CO2 21* 07/07/2012 1505   CO2 28 07/17/2011 1455   BUN 20.0 07/07/2012 1505   BUN 15 07/17/2011 1455   CREATININE 0.8 07/07/2012 1505   CREATININE 0.56 07/17/2011 1455      Component Value Date/Time   CALCIUM 9.4 07/07/2012 1505   CALCIUM  9.3 07/17/2011 1455   ALKPHOS 99 07/07/2012 1505   ALKPHOS 83 07/17/2011 1455   AST 35* 07/07/2012 1505   AST 30 07/17/2011 1455   ALT 53 07/07/2012 1505   ALT 31 07/17/2011 1455   BILITOT 1.00 07/07/2012 1505   BILITOT 0.4 07/17/2011 1455       Lab Results  Component Value Date   LABCA2 15 07/17/2011   LABCA2 15 07/17/2011   LABCA2 15 07/17/2011   LABCA2 15 07/17/2011  LABCA2 15 07/17/2011    No components found with this basename: [id number]    No results found for this basename: INR:1;PROTIME:1 in the last 168 hours  Urinalysis No results found for this basename: colorurine,  appearanceur,  labspec,  phurine,  glucoseu,  hgbur,  bilirubinur,  ketonesur,  proteinur,  urobilinogen,  nitrite,  leukocytesur    STUDIES: Mm Digital Diagnostic Bilat  07/12/2012  *RADIOLOGY REPORT*  Clinical Data:  The patient underwent left lumpectomy, chemotherapy, and radiation therapy for breast cancer in 2005/2006.  DIGITAL DIAGNOSTIC BILATERAL MAMMOGRAM WITH CAD  Comparison:  07/09/2011, 07/07/2010, 07/05/2009, 07/04/2008  Findings:  There are scattered fibroglandular densities.  Left lumpectomy changes are present.  There are benign calcifications at the lumpectomy site.  There is no suspicious dominant mass, nonsurgical architectural distortion, or calcification to suggest malignancy. Mammographic images were processed with CAD.  IMPRESSION: No mammographic evidence of malignancy.  RECOMMENDATION: Yearly screening mammography is suggested.  BI-RADS CATEGORY 2:  Benign finding(s).   Original Report Authenticated By: Chaney Born, M.D.     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 Shelby Dubin  (3) on tamoxifen between May 2006 and May 2011, at which point she switched to anastrozole, switching back to tamoxifen September 2013.   PLAN: I don't think we're going to be able to safely make headway on the vaginal dryness issue unless we go back to tamoxifen. We went over the new data that shows that 10 years of tamoxifen are better than 5, by margin similar to that of switching to an aromatase inhibitor. After much discussion, we decided we would go back on tamoxifen, and this will allow her to use Vagifem suppositories or similar preparations safely as far as her breast cancer is concerned. Of course both tamoxifen and an opposed estrogen can promote endometrial cancer. This was also discussed.  The plan then is to continue on tamoxifen at least on until may of 2016, at which point we will of reviewed the then current data regarding prolong anti-estrogen use. Kerra will see me again in one year. She knows to call for any problems that may develop before the next visit.  MAGRINAT,GUSTAV C    07/16/2012

## 2012-07-15 NOTE — Telephone Encounter (Signed)
Gave patient appointment for 2014 

## 2012-07-16 ENCOUNTER — Encounter: Payer: Self-pay | Admitting: Oncology

## 2012-07-26 DIAGNOSIS — K1321 Leukoplakia of oral mucosa, including tongue: Secondary | ICD-10-CM | POA: Insufficient documentation

## 2012-07-26 DIAGNOSIS — G620 Drug-induced polyneuropathy: Secondary | ICD-10-CM | POA: Insufficient documentation

## 2012-07-26 DIAGNOSIS — G250 Essential tremor: Secondary | ICD-10-CM | POA: Insufficient documentation

## 2012-07-26 DIAGNOSIS — H93239 Hyperacusis, unspecified ear: Secondary | ICD-10-CM | POA: Insufficient documentation

## 2012-07-26 DIAGNOSIS — M87073 Idiopathic aseptic necrosis of unspecified ankle: Secondary | ICD-10-CM | POA: Insufficient documentation

## 2012-07-26 DIAGNOSIS — G252 Other specified forms of tremor: Secondary | ICD-10-CM | POA: Insufficient documentation

## 2012-08-10 ENCOUNTER — Other Ambulatory Visit: Payer: BC Managed Care – PPO | Admitting: Lab

## 2012-08-17 ENCOUNTER — Ambulatory Visit: Payer: BC Managed Care – PPO | Admitting: Oncology

## 2012-09-21 ENCOUNTER — Telehealth: Payer: Self-pay | Admitting: Oncology

## 2012-09-21 NOTE — Telephone Encounter (Signed)
S/w the pt and she is aware of the cancelled 07/13/2013 appt that has been r/s to 07/20/2013 due to the md will be on vac.

## 2012-09-25 DIAGNOSIS — R002 Palpitations: Secondary | ICD-10-CM

## 2012-09-25 HISTORY — DX: Palpitations: R00.2

## 2012-10-10 ENCOUNTER — Other Ambulatory Visit (HOSPITAL_COMMUNITY): Payer: Self-pay | Admitting: Cardiovascular Disease

## 2012-10-10 DIAGNOSIS — I10 Essential (primary) hypertension: Secondary | ICD-10-CM

## 2012-10-17 ENCOUNTER — Ambulatory Visit (HOSPITAL_COMMUNITY)
Admission: RE | Admit: 2012-10-17 | Discharge: 2012-10-17 | Disposition: A | Discharge status: Home / Self Care | Payer: BC Managed Care – PPO | Source: Ambulatory Visit | Attending: Cardiovascular Disease | Admitting: Cardiovascular Disease

## 2012-10-17 DIAGNOSIS — I10 Essential (primary) hypertension: Secondary | ICD-10-CM | POA: Insufficient documentation

## 2012-10-17 DIAGNOSIS — E785 Hyperlipidemia, unspecified: Secondary | ICD-10-CM | POA: Insufficient documentation

## 2012-10-17 DIAGNOSIS — Z853 Personal history of malignant neoplasm of breast: Secondary | ICD-10-CM | POA: Insufficient documentation

## 2012-10-17 NOTE — Progress Notes (Signed)
[facility]   2D echo completed 10/17/2012.   Cindy Ezrie Bunyan, RDCS   

## 2012-12-13 ENCOUNTER — Ambulatory Visit (INDEPENDENT_AMBULATORY_CARE_PROVIDER_SITE_OTHER): Payer: BC Managed Care – PPO | Admitting: Surgery

## 2013-01-03 ENCOUNTER — Ambulatory Visit (INDEPENDENT_AMBULATORY_CARE_PROVIDER_SITE_OTHER): Payer: BC Managed Care – PPO | Admitting: Surgery

## 2013-01-24 ENCOUNTER — Telehealth: Payer: Self-pay | Admitting: Obstetrics & Gynecology

## 2013-01-24 NOTE — Telephone Encounter (Signed)
Dr. Tamala Julian is calling about the ABOG FORM she mailed to you. Please call her at [phone].

## 2013-01-27 ENCOUNTER — Encounter (INDEPENDENT_AMBULATORY_CARE_PROVIDER_SITE_OTHER): Payer: Self-pay | Admitting: Surgery

## 2013-01-27 ENCOUNTER — Ambulatory Visit (INDEPENDENT_AMBULATORY_CARE_PROVIDER_SITE_OTHER): Payer: BC Managed Care – PPO | Admitting: Surgery

## 2013-01-27 VITALS — BP 128/80 | HR 80 | Temp 97.9°F | Resp 16 | Ht 63.5 in | Wt 150.2 lb

## 2013-01-27 DIAGNOSIS — Z853 Personal history of malignant neoplasm of breast: Secondary | ICD-10-CM

## 2013-01-27 NOTE — Progress Notes (Signed)
NAME: [name]       DOB: Dec 28, 1955           DATE: 01/27/2013       MRN: [id number]   Jessica Mack is a 57 y.o.Marland Kitchenfemale who presents for routine followup of her Stage I Left breast cancer diagnosed in 2005 and treated with lumpectomy and radiation,now on anti-estrogen. She has no problems or concerns on either side.  PFSH: She has had no significant changes since the last visit here.  ROS: There have been no significant changes since the last visit here  EXAM: General: The patient is alert, oriented, generally healty appearing, NAD. Mood and affect are normal.  Breasts:  Right is wnl. Left shows continued firmness ant lumpectomy site, but no suspicious areas  Lymphatics: She has no axillary or supraclavicular adenopathy on either side.  Extremities: Full ROM of the surgical side with no lymphedema noted.  Data Reviewed: Mammogram, 09/13: DIGITAL DIAGNOSTIC BILATERAL MAMMOGRAM WITH CAD  Comparison: 07/09/2011, 07/07/2010, 07/05/2009, 07/04/2008  Findings: There are scattered fibroglandular densities. Left  lumpectomy changes are present. There are benign calcifications at  the lumpectomy site. There is no suspicious dominant mass,  nonsurgical architectural distortion, or calcification to suggest  malignancy.  Mammographic images were processed with CAD.  IMPRESSION:  No mammographic evidence of malignancy.  RECOMMENDATION:  Yearly screening mammography is suggested.  BI-RADS CATEGORY 2: Benign finding(s).  Original Report Authenticated By: Daryl Eastern, M.D.   Impression: Doing well, with no evidence of recurrent cancer or new cancer  Plan: Will continue to follow up on an annual basis here.

## 2013-01-27 NOTE — Patient Instructions (Signed)
Continue annual mammograms and have an MRI with this year's mammogram.  See [place] again in one year

## 2013-02-03 ENCOUNTER — Ambulatory Visit (INDEPENDENT_AMBULATORY_CARE_PROVIDER_SITE_OTHER): Payer: BC Managed Care – PPO | Admitting: Neurology

## 2013-02-03 ENCOUNTER — Encounter: Payer: Self-pay | Admitting: Neurology

## 2013-02-03 DIAGNOSIS — G250 Essential tremor: Secondary | ICD-10-CM

## 2013-02-03 DIAGNOSIS — G620 Drug-induced polyneuropathy: Secondary | ICD-10-CM

## 2013-02-03 DIAGNOSIS — K1321 Leukoplakia of oral mucosa, including tongue: Secondary | ICD-10-CM

## 2013-02-03 NOTE — Progress Notes (Signed)
Reason for visit: Peripheral neuropathy  Jessica Mack is an 57 y.o. 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 The patient underwent chemotherapy with tamoxifen 9 years ago, and she developed a significant peripheral neuropathy associated with dysesthesias of the hands and feet. The patient has not completely recovered from this, and she continues to have hypersensitivity on the soles of her feet and on her fingers. The patient will have episodic blanching of some of the fingers consistent with Raynaud's phenomenon. The patient is unable to walk barefoot secondary to the hypersensitivity. The patient has some hypersensitivity on the dorsum of the left foot, not the right. 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 The patient is on disability for this. The patient returns to this office for an evaluation. In the past, she has been tried on a multitude of medications that include Cymbalta, Lyrica, gabapentin, amitriptyline, mexiletine, and Metanx. All of these medications were ineffective. The patient denies any significant issues with balance, and she has not had any falls. The patient denies any problems controlling the bowels or the bladder. Recently, she has been noted to have an elevation in blood pressure and heart rate during the day, and she has gone on Bystolic, which has been helpful. The patient has problems at nighttime with trying to sleep secondary to the pain involved. The patient takes Ambien at night if needed. The patient occasional have lancinating pains in the feet.  Past Medical History  Diagnosis Date  . Peripheral neuropathy   . Hypertriglyceridemia   . History of breast cancer   . Blood transfusion   . GERD (gastroesophageal reflux disease)   . Ulcer   . Tongue dysplasia   . Irritable bowel syndrome   . Migraines   . Hypertension   .  Avascular necrosis of bone     Left talus    Past Surgical History  Procedure Laterality Date  . Wrist surgery  1988    right, DeQuervains  . Cesarean section  1991, 1993    x2  . Laparoscopic cholecystectomy  2003  . Breast lumpectomy  06/2004    left, with port placement  . Ankle surgery  07/2007    left, revascularization  . Shoulder arthroscopy  10/2005    right  . Tongue surgery  2012, 2013    Family History  Problem Relation Age of Onset  . Asthma Brother   . Asthma Sister   . Cancer Mother     melanoma  . Cancer Father     colon    Social history:  reports that she has never smoked. She does not have any smokeless tobacco history on file. She reports that she drinks about 1.2 ounces of alcohol per week. She reports that she does not use illicit drugs.   Allergies:  Allergies  Allergen Reactions  . Crestor (Rosuvastatin)     Liver function elevated     Medications:  Current Outpatient Prescriptions on File Prior to Visit  Medication Sig Dispense Refill  . aspirin 81 MG tablet Take 81 mg by mouth daily. 2 tablets daily      . clonazePAM (KLONOPIN) 1 MG tablet Take 0.5 mg by mouth at bedtime as needed.       . Multiple Vitamins-Minerals (MULTIVITAMIN PO) Take 1 tablet by mouth daily. With calcium      .  nebivolol (BYSTOLIC) 10 MG tablet Take 10 mg by mouth daily.      . Omega-3-acid Ethyl Esters (LOVAZA PO) Take 2 g by mouth 2 (two) times daily.       . tamoxifen (NOLVADEX) 20 MG tablet Take 1 tablet (20 mg total) by mouth daily.  90 tablet  12   No current facility-administered medications on file prior to visit.    ROS:  Out of a complete 14 system review of symptoms, the patient complains only of the following symptoms, and all other reviewed systems are negative.  Palpitations Problems swallowing Numbness Insomnia  There were no vitals taken for this visit.  Physical Exam  General: The patient is alert and cooperative at the time of the  examination. The patient is minimally obese.  Skin: No significant peripheral edema is noted.   Neurologic Exam  Cranial nerves: Facial symmetry is present. Speech is normal, no aphasia or dysarthria is noted. Extraocular movements are full. Visual fields are full.  Motor: The patient has good strength in all 4 extremities.  Coordination: The patient has good finger-nose-finger and heel-to-shin bilaterally.  Gait and station: The patient has a normal gait. Tandem gait is minimally unsteady. Romberg is negative. No drift is seen.  Reflexes: Deep tendon reflexes are symmetric, but are depressed throughout.   Assessment/Plan:  1. Peripheral neuropathy  2. History breast cancer, in remission  The patient is having ongoing issues with hypersensitivity of the feet and hands. 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 The patient will need given a trial on a topical agent for her neuropathy to see if this helps. In the future, carbamazepine and lamotrigine may be considered, but she does not wish to go on medication such as this at this time.  Jill Alexanders MD 02/05/2013 5:38 PM  [facility] [address] [place], [place] [address]  Phone [phone] Fax [phone]

## 2013-03-09 ENCOUNTER — Telehealth: Payer: Self-pay | Admitting: Cardiovascular Disease

## 2013-03-09 MED ORDER — EZETIMIBE 10 MG PO TABS: 10.0000 mg | ORAL_TABLET | Freq: Every day | ORAL | Status: DC

## 2013-03-09 NOTE — Telephone Encounter (Signed)
Need new prescription for Zeita 10 mg#90-Call to [facility]-Would like to have some sample until her prescription comes!

## 2013-03-09 NOTE — Telephone Encounter (Signed)
Sent refills for zetia 30 day and 90 day  Prescriptions. Left message @ number left that these rx's were done. Also no samples available.

## 2013-03-17 ENCOUNTER — Encounter: Payer: Self-pay | Admitting: Obstetrics & Gynecology

## 2013-03-17 ENCOUNTER — Ambulatory Visit (INDEPENDENT_AMBULATORY_CARE_PROVIDER_SITE_OTHER): Payer: BC Managed Care – PPO | Admitting: Obstetrics & Gynecology

## 2013-03-17 VITALS — BP 128/86 | Ht 63.5 in | Wt 150.2 lb

## 2013-03-17 DIAGNOSIS — Z01419 Encounter for gynecological examination (general) (routine) without abnormal findings: Secondary | ICD-10-CM

## 2013-03-17 DIAGNOSIS — Z124 Encounter for screening for malignant neoplasm of cervix: Secondary | ICD-10-CM

## 2013-03-17 MED ORDER — NONFORMULARY OR COMPOUNDED ITEM: 1.2500 mL | Status: DC

## 2013-03-17 NOTE — Progress Notes (Addendum)
57 y.o. G2P2 MarriedCaucasianF here for annual exam.  Son has a 4.0 at [employer].  Daughter graduated from [employer].  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    Using vagifem for the last few weeks.  Needs Rx for testosterone.    Patient's last menstrual period was 06/26/2004.          Sexually active: yes  The current method of family planning is post menopausal status.    Exercising: yes  walking, pilates, and water aerobics Smoker:  no  Health Maintenance: Pap:  03/14/12 WNL History of abnormal Pap:  yes MMG:  07/02/12 normal/MRI due this year Colonoscopy:  12/09 repeat in 5 years BMD:   7/12 TDaP:  4/07 Screening Labs: PCP, Hb today: PCP, Urine today: PCP   reports that she has never smoked. She has never used smokeless tobacco. She reports that she drinks about 1.2 ounces of alcohol per week. She reports that she does not use illicit drugs.  Past Medical History  Diagnosis Date  . Peripheral neuropathy   . Hypertriglyceridemia   . History of breast cancer     invasive ductal cancer left breast  . Blood transfusion   . GERD (gastroesophageal reflux disease)   . Ulcer   . Tongue dysplasia   . Irritable bowel syndrome   . Migraines   . Hypertension   . Avascular necrosis of bone     Left talus  . Osteopenia   . Dysplastic nevus 1988    right hip  . Plantar fasciitis 6/04  . Metabolic syndrome     resolved with diet and exercise    Past Surgical History  Procedure Laterality Date  . Wrist surgery  1988    right, DeQuervains  . Cesarean section  1991, 1993    x2  . Laparoscopic cholecystectomy  2003  . Breast lumpectomy  06/2004    left, with port placement  . Ankle surgery  07/2007    left, revascularization  . Shoulder arthroscopy  10/2005    right  . Tongue surgery  2012, 2013    Current Outpatient Prescriptions  Medication Sig Dispense Refill  . aspirin 81 MG tablet Take 81 mg by mouth daily. 2 tablets daily      . clonazePAM  (KLONOPIN) 1 MG tablet Take 0.5 mg by mouth at bedtime as needed.       . Estradiol (VAGIFEM) 10 MCG TABS Place 10 mcg vaginally. Twice monthly      . ezetimibe (ZETIA) 10 MG tablet Take 1 tablet (10 mg total) by mouth daily.  90 tablet  3  . Multiple Vitamins-Minerals (MULTIVITAMIN PO) Take 1 tablet by mouth daily. With calcium      . nebivolol (BYSTOLIC) 10 MG tablet Take 10 mg by mouth daily.      . niacin (NIASPAN) 750 MG CR tablet Take 1,500 mg by mouth at bedtime.      . NONFORMULARY OR COMPOUNDED ITEM Topical testosterone propionate 1% in petrolatum Use small amount externally Q HS      . Omega-3-acid Ethyl Esters (LOVAZA PO) Take 2 g by mouth 2 (two) times daily.       . tamoxifen (NOLVADEX) 20 MG tablet Take 1 tablet (20 mg total) by mouth daily.  90 tablet  12  . zolpidem (AMBIEN CR) 6.25 MG CR tablet Take 6.25 mg by mouth at bedtime as needed for sleep.       No current  facility-administered medications for this visit.    Family History  Problem Relation Age of Onset  . Asthma Brother   . Asthma Sister   . Cancer Mother     melanoma  . Cancer Father     colon    ROS:  Pertinent items are noted in HPI.  Otherwise, a comprehensive ROS was negative.  Exam:   BP 128/86  Ht 5' 3.5" (1.613 m)  Wt 150 lb 3.2 oz (68.13 kg)  BMI 26.19 kg/m2  LMP 06/26/2004  Weight change: -1  Height: 5' 3.5" (161.3 cm)  Ht Readings from Last 3 Encounters:  03/17/13 5' 3.5" (1.613 m)  01/27/13 5' 3.5" (1.613 m)  07/15/12 5\' 4"  (1.626 m)    General appearance: alert, cooperative and appears stated age Head: Normocephalic, without obvious abnormality, atraumatic Neck: no adenopathy, supple, symmetrical, trachea midline and thyroid normal to inspection and palpation Lungs: clear to auscultation bilaterally Breasts: right breast normal without masses, skin changes or nipple retraction, left breast with two well healed scars--one just above areola and one axillary.  Both with appropriate and  stable radiaiton changes and thickening.  No discrete masses.  No nipple changes. Heart: regular rate and rhythm Abdomen: soft, non-tender; bowel sounds normal; no masses,  no organomegaly Extremities: extremities normal, atraumatic, no cyanosis or edema Skin: Skin color, texture, turgor normal. No rashes or lesions Lymph nodes: Cervical, supraclavicular, and axillary nodes normal. No abnormal inguinal nodes palpated Neurologic: Grossly normal   Pelvic: External genitalia:  no lesions              Urethra:  normal appearing urethra with no masses, tenderness or lesions              Bartholins and Skenes: normal                 Vagina: normal appearing vagina with normal color and discharge, no lesions              Cervix: no lesions              Pap taken: yes Bimanual Exam:  Uterus:  normal size, contour, position, consistency, mobility, non-tender              Adnexa: normal adnexa and no mass, fullness, tenderness               Rectovaginal: Confirms               Anus:  normal sphincter tone, no lesions  A:  Well Woman with normal exam PMP, no HRT H/O breast cancer 9/05 s/p lumpectomy, sentinel node biopsy, s/p chemotherapy and radiation, s/p tamoxifen.  Now back on Tamoxifen to complete 10 years of therapy. H/o metabolic syndrome improved with wt loss several years ago Labile BP, on Beta blocker, being followed by cardiology Chronic insomnia Chemotherapy induced neuropathy Vaginal atrophy Thickened, white vulvar skin changes.  Bx showed benign mucosa with hyperplasia and hyperkeratosis Osteopenia  P:   Mammogram yearly with MRI every other year.  MRI due 9/14.  Pt aware. Pap smear today Sees Dr. Evlyn Kanner every six months No Rx needed for Ambien (on CR 6.25mg  dosage) Rx for topical testosterone prioprionate 1% in petrolatum.  Small amt two to three times weekly.  #60 gms. BMD needs to be scheduled for 8/14. return annually or prn  An After Visit Summary was printed and given  to the patient.

## 2013-03-17 NOTE — Patient Instructions (Addendum)

## 2013-03-22 LAB — IPS PAP TEST WITH REFLEX TO HPV

## 2013-03-24 ENCOUNTER — Other Ambulatory Visit: Payer: Self-pay | Admitting: Obstetrics & Gynecology

## 2013-03-24 DIAGNOSIS — M8580 Other specified disorders of bone density and structure, unspecified site: Secondary | ICD-10-CM

## 2013-04-27 ENCOUNTER — Telehealth: Payer: Self-pay | Admitting: Obstetrics & Gynecology

## 2013-04-27 MED ORDER — ZOLPIDEM TARTRATE ER 6.25 MG PO TBCR: 6.2500 mg | EXTENDED_RELEASE_TABLET | Freq: Every evening | ORAL | Status: DC | PRN

## 2013-04-27 NOTE — Telephone Encounter (Signed)
Is this ok, please advise. Chart in corner if needed.

## 2013-04-27 NOTE — Telephone Encounter (Signed)
Zolpidem  6.25, pt is asking for refills  [facility] 90 day supply, with 1 mail

## 2013-04-27 NOTE — Telephone Encounter (Signed)
Left message that rx for Remus Loffler has been sent to [facility]//kn

## 2013-04-27 NOTE — Telephone Encounter (Signed)
This is fine to refill.

## 2013-05-30 ENCOUNTER — Other Ambulatory Visit: Payer: Self-pay | Admitting: *Deleted

## 2013-05-30 ENCOUNTER — Telehealth: Payer: Self-pay | Admitting: Cardiovascular Disease

## 2013-05-30 DIAGNOSIS — Z79899 Other long term (current) drug therapy: Secondary | ICD-10-CM

## 2013-05-30 DIAGNOSIS — Z853 Personal history of malignant neoplasm of breast: Secondary | ICD-10-CM

## 2013-05-30 DIAGNOSIS — Z1239 Encounter for other screening for malignant neoplasm of breast: Secondary | ICD-10-CM | POA: Insufficient documentation

## 2013-05-30 DIAGNOSIS — E782 Mixed hyperlipidemia: Secondary | ICD-10-CM

## 2013-05-30 NOTE — Telephone Encounter (Signed)
Returned call.  Left message that lab(s) ordered and Lab slip mailed.  Call back today before 4 pm if questions.

## 2013-05-30 NOTE — Progress Notes (Signed)
Jessica Mack called to this RN stating she is due for MRI this year with mammogram in September.  She also needs to cancel labs at this office as she will be obtaining at outside facility -  This RN cancelled lab appointment and placed MRI of breast order.

## 2013-05-30 NOTE — Telephone Encounter (Signed)
Patient needs lab slip mailed to her so she can have her labs done prior to her visit with Dr. Claiborne Billings on 07/25/13.

## 2013-05-31 ENCOUNTER — Other Ambulatory Visit: Payer: Self-pay | Admitting: Emergency Medicine

## 2013-05-31 ENCOUNTER — Other Ambulatory Visit: Payer: Self-pay | Admitting: Obstetrics & Gynecology

## 2013-05-31 ENCOUNTER — Other Ambulatory Visit: Payer: Self-pay

## 2013-05-31 DIAGNOSIS — M8580 Other specified disorders of bone density and structure, unspecified site: Secondary | ICD-10-CM

## 2013-05-31 DIAGNOSIS — Z853 Personal history of malignant neoplasm of breast: Secondary | ICD-10-CM

## 2013-06-02 ENCOUNTER — Telehealth: Payer: Self-pay | Admitting: *Deleted

## 2013-06-02 NOTE — Telephone Encounter (Signed)
sw pt gv appt d/t for 07/14/13 @ 10:15am to have her mammo. Pt is aware of the location...td

## 2013-06-05 ENCOUNTER — Ambulatory Visit
Admission: RE | Admit: 2013-06-05 | Discharge: 2013-06-05 | Disposition: A | Discharge status: Home / Self Care | Payer: BC Managed Care – PPO | Source: Ambulatory Visit | Attending: Obstetrics & Gynecology | Admitting: Obstetrics & Gynecology

## 2013-06-05 DIAGNOSIS — M8580 Other specified disorders of bone density and structure, unspecified site: Secondary | ICD-10-CM

## 2013-06-07 ENCOUNTER — Telehealth: Payer: Self-pay | Admitting: *Deleted

## 2013-06-07 NOTE — Telephone Encounter (Signed)
Patient notified of BMD results as instructed by Dr Sabra Heck.  Patient states she is already aware of results and L3 has T-score of 2.7.  She is on the Tamoxifen for another 13 months and has next appointment with Dr Jana Hakim iin late sept 2014.  She may also review with Dr Forde Dandy.  She requests to discuss this with Dr Sabra Heck next week at her convenience.  (patient has out of town company this week)

## 2013-06-07 NOTE — Telephone Encounter (Signed)
Message copied by Alisa Graff on Wed Jun 07, 2013  2:34 PM ------      Message from: Jerene Bears      Created: Wed Jun 07, 2013  2:08 PM       Kennon Rounds,      Can you please call Dr. Katrinka Blazing.  Please inform BMD shows worsening osteopenia in hips and spine.  Not osteoporosis yet.  Fracture risk is not in high risk category yet.  She may want to talk with Dr. Darnelle Catalan before deciding if wants to start anything.  Could have consult with me, too.  Could also wait until finished with Tamoxifen and start Evista.  She will probably have opinion about what she wants to do. ------

## 2013-06-27 ENCOUNTER — Other Ambulatory Visit: Payer: Self-pay | Admitting: *Deleted

## 2013-06-27 MED ORDER — TAMOXIFEN CITRATE 20 MG PO TABS: 20.0000 mg | ORAL_TABLET | Freq: Every day | ORAL | Status: DC

## 2013-06-29 ENCOUNTER — Other Ambulatory Visit: Payer: Self-pay | Admitting: *Deleted

## 2013-06-29 DIAGNOSIS — Z1231 Encounter for screening mammogram for malignant neoplasm of breast: Secondary | ICD-10-CM

## 2013-06-29 DIAGNOSIS — Z853 Personal history of malignant neoplasm of breast: Secondary | ICD-10-CM

## 2013-06-29 MED ORDER — TAMOXIFEN CITRATE 20 MG PO TABS: 20.0000 mg | ORAL_TABLET | Freq: Every day | ORAL | Status: DC

## 2013-07-06 ENCOUNTER — Other Ambulatory Visit: Payer: BC Managed Care – PPO | Admitting: Lab

## 2013-07-13 ENCOUNTER — Ambulatory Visit: Payer: BC Managed Care – PPO | Admitting: Oncology

## 2013-07-13 LAB — COMPREHENSIVE METABOLIC PANEL
ALT: 33 U/L (ref 0–35)
AST: 27 U/L (ref 0–37)
Albumin: 4.2 g/dL (ref 3.5–5.2)
Alkaline Phosphatase: 44 U/L (ref 39–117)
BUN: 19 mg/dL (ref 6–23)
CO2: 25 mEq/L (ref 19–32)
Calcium: 9.2 mg/dL (ref 8.4–10.5)
Chloride: 105 mEq/L (ref 96–112)
Creat: 0.73 mg/dL (ref 0.50–1.10)
Glucose, Bld: 91 mg/dL (ref 70–99)
Potassium: 4.5 mEq/L (ref 3.5–5.3)
Sodium: 140 mEq/L (ref 135–145)
Total Bilirubin: 0.8 mg/dL (ref 0.3–1.2)
Total Protein: 6.2 g/dL (ref 6.0–8.3)

## 2013-07-13 LAB — CBC
HCT: 39.2 % (ref 36.0–46.0)
Hemoglobin: 13.2 g/dL (ref 12.0–15.0)
MCH: 32.2 pg (ref 26.0–34.0)
MCHC: 33.7 g/dL (ref 30.0–36.0)
MCV: 95.6 fL (ref 78.0–100.0)
Platelets: 218 10*3/uL (ref 150–400)
RBC: 4.1 MIL/uL (ref 3.87–5.11)
RDW: 13.4 % (ref 11.5–15.5)
WBC: 3.9 10*3/uL — ABNORMAL LOW (ref 4.0–10.5)

## 2013-07-14 ENCOUNTER — Ambulatory Visit
Admission: RE | Admit: 2013-07-14 | Discharge: 2013-07-14 | Disposition: A | Discharge status: Home / Self Care | Payer: BC Managed Care – PPO | Source: Ambulatory Visit | Attending: Oncology | Admitting: Oncology

## 2013-07-14 DIAGNOSIS — Z853 Personal history of malignant neoplasm of breast: Secondary | ICD-10-CM

## 2013-07-14 LAB — NMR LIPOPROFILE WITH LIPIDS
Cholesterol, Total: 141 mg/dL (ref <200)
HDL Particle Number: 51.3 umol/L (ref >=30.5)
HDL Size: 9 nm — ABNORMAL LOW (ref >=9.2)
HDL-C: 59 mg/dL (ref >=40)
LDL (calc): 53 mg/dL (ref <100)
LDL Particle Number: 859 nmol/L (ref <1000)
LDL Size: 20.1 nm — ABNORMAL LOW (ref >20.5)
LP-IR Score: 49 — ABNORMAL HIGH (ref <=45)
Large HDL-P: 6.3 umol/L (ref >=4.8)
Large VLDL-P: 2.7 nmol/L (ref <=2.7)
Small LDL Particle Number: 538 nmol/L — ABNORMAL HIGH (ref <=527)
Triglycerides: 144 mg/dL (ref <150)
VLDL Size: 44.2 nm (ref <=46.6)

## 2013-07-17 ENCOUNTER — Ambulatory Visit (HOSPITAL_COMMUNITY)
Admission: RE | Admit: 2013-07-17 | Discharge: 2013-07-17 | Disposition: A | Discharge status: Home / Self Care | Payer: BC Managed Care – PPO | Source: Ambulatory Visit | Attending: Oncology | Admitting: Oncology

## 2013-07-17 DIAGNOSIS — Z9221 Personal history of antineoplastic chemotherapy: Secondary | ICD-10-CM | POA: Insufficient documentation

## 2013-07-17 DIAGNOSIS — Z09 Encounter for follow-up examination after completed treatment for conditions other than malignant neoplasm: Secondary | ICD-10-CM | POA: Insufficient documentation

## 2013-07-17 DIAGNOSIS — K7689 Other specified diseases of liver: Secondary | ICD-10-CM | POA: Insufficient documentation

## 2013-07-17 DIAGNOSIS — Z853 Personal history of malignant neoplasm of breast: Secondary | ICD-10-CM | POA: Insufficient documentation

## 2013-07-17 DIAGNOSIS — I517 Cardiomegaly: Secondary | ICD-10-CM | POA: Insufficient documentation

## 2013-07-17 DIAGNOSIS — Z923 Personal history of irradiation: Secondary | ICD-10-CM | POA: Insufficient documentation

## 2013-07-17 DIAGNOSIS — Z1239 Encounter for other screening for malignant neoplasm of breast: Secondary | ICD-10-CM

## 2013-07-17 DIAGNOSIS — L905 Scar conditions and fibrosis of skin: Secondary | ICD-10-CM | POA: Insufficient documentation

## 2013-07-17 MED ORDER — GADOBENATE DIMEGLUMINE 529 MG/ML IV SOLN
14.0000 mL | Freq: Once | INTRAVENOUS | Status: AC | PRN
  Administered 2013-07-17: 14 mL via INTRAVENOUS

## 2013-07-17 NOTE — Telephone Encounter (Signed)
Quick Note:  Released into my chart ______ 

## 2013-07-20 ENCOUNTER — Encounter: Payer: Self-pay | Admitting: Cardiology

## 2013-07-20 ENCOUNTER — Ambulatory Visit (HOSPITAL_BASED_OUTPATIENT_CLINIC_OR_DEPARTMENT_OTHER): Payer: BC Managed Care – PPO | Admitting: Oncology

## 2013-07-20 VITALS — BP 127/86 | HR 89 | Temp 98.7°F | Resp 18 | Ht 63.5 in | Wt 153.8 lb

## 2013-07-20 DIAGNOSIS — Z170 Estrogen receptor positive status [ER+]: Secondary | ICD-10-CM

## 2013-07-20 DIAGNOSIS — C50412 Malignant neoplasm of upper-outer quadrant of left female breast: Secondary | ICD-10-CM

## 2013-07-20 DIAGNOSIS — C50219 Malignant neoplasm of upper-inner quadrant of unspecified female breast: Secondary | ICD-10-CM

## 2013-07-20 DIAGNOSIS — Z23 Encounter for immunization: Secondary | ICD-10-CM

## 2013-07-20 MED ORDER — INFLUENZA VAC SPLIT QUAD 0.5 ML IM SUSP
0.5000 mL | Freq: Once | INTRAMUSCULAR | Status: AC
  Administered 2013-07-20: 0.5 mL via INTRAMUSCULAR
  Filled 2013-07-20: qty 0.5

## 2013-07-20 NOTE — Progress Notes (Signed)
ID: Jessica Mack   DOB: 07-Nov-1955  MR#: [id number]  TKZ#:[id number]  PCP: Reynold Bowen GYN: Edwinna Areola SU: Osborn Coho OTHER MD: Netty Starring, Floyde Parkins, Pryor Montes  HISTORY OF PRESENT ILLNESS: Jessica Mack palpated a mass in her left breast while showering 06/2000.  She proceeded to bilateral mammograms at the [facility] the same day, and this showed, as compared to prior, a new asymmetric density in the superior aspect of the left breast, which was soft but palpable.  Ultrasound showed a vague area of persistent shadowing measuring approximately 1.3 cm.   Biopsy was performed the same day and showed ([id number]) an infiltrating ductal carcinoma with focal lobular features.  There was no obvious lymphovascular invasion.  The greatest extent of tumor in the biopsy was 1.3 cm.    The patient saw Dr. Ronnie Derby on 09/02 and had bilateral breast MRIs the same day.  The right breast was negative.  In the left breast there was an oval mass measuring up to 1.8 cm with no lower axillary adenopathy noted. Accordingly, the patient proceeded to left lumpectomy and axillary lymph node dissection as well as port placement 07/01/04.  Results of this procedure and her subsequent history are as detailed below.   INTERVAL HISTORY: Jessica Mack returns today for followup of her breast cancer. The interval history is generally unremarkable as far as her health is concerned. There are some issues of concern to her not related to the breast cancer, and she would be interested in discussing these further with our psychologist Franklin: Peripheral neuropathy is unchanged. She is also having issues related to vaginal dryness and atrophy. She uses the Vagifem suppositories inconsistently. Otherwise a detailed review of systems today was noncontributory. She exercises frequently, though not regularly.  PAST MEDICAL HISTORY: Past Medical History  Diagnosis Date  . Peripheral neuropathy   .  Hypertriglyceridemia   . History of breast cancer 2005    invasive ductal cancer left breast  . Blood transfusion   . GERD (gastroesophageal reflux disease)   . Ulcer   . Tongue dysplasia   . Irritable bowel syndrome   . Migraines   . Hypertension   . Avascular necrosis of bone     Left talus  . Osteopenia   . Dysplastic nevus 1988    right hip  . Plantar fasciitis 6/04  . Metabolic syndrome     resolved with diet and exercise  . Palpitations 12/13    normal echo    PAST SURGICAL HISTORY: Past Surgical History  Procedure Laterality Date  . Wrist surgery  1988    right, [place]Quervains  . Cesarean section  1991, 1993    x2  . Laparoscopic cholecystectomy  2003  . Breast lumpectomy  06/2004    left, with port placement  . Ankle surgery  07/2007    left, revascularization  . Shoulder arthroscopy  10/2005    right  . Tongue surgery  2012, 2013    FAMILY HISTORY Family History  Problem Relation Age of Onset  . Asthma Brother   . Asthma Sister   . Cancer Mother     melanoma  . Cancer Father     colon  Both parents are alive. Her father had a colon polyp removed remotely.  Her mother has some hypercholesterolemia.  There is no history of breast or ovarian cancer in the family.  A 55 year old sister has some asthma.  A 55 year old brother has some weight problems.  GYNECOLOGIC HISTORY: GX P2. 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 Her husband Elta Guadeloupe is a Chief Executive Officer, Company secretary, and businessman. Daughter [name] is currently 29 years old and is a Probation officer. Son [name] is attending [employer], in Engineer, production and contemplating a move to [place]  ADVANCED DIRECTIVES: in place  HEALTH MAINTENANCE: History  Substance Use Topics  . Smoking status: Never Smoker   . Smokeless tobacco: Never Used  . Alcohol Use: 1.2 oz/week    2 Glasses of wine per week      Comment: wine daily     Colonoscopy: [facility]  PAP:  Bone density:  Lipid panel:  Allergies  Allergen Reactions  . Augmentin [Amoxicillin-Pot Clavulanate]     abd cramping  . Crestor [Rosuvastatin]     Liver function elevated     Current Outpatient Prescriptions  Medication Sig Dispense Refill  . aspirin 81 MG tablet Take 81 mg by mouth daily. 2 tablets daily      . clonazePAM (KLONOPIN) 1 MG tablet Take 0.5 mg by mouth at bedtime as needed.       . Estradiol (VAGIFEM) 10 MCG TABS Place 10 mcg vaginally. Twice monthly      . ezetimibe (ZETIA) 10 MG tablet Take 1 tablet (10 mg total) by mouth daily.  90 tablet  3  . Multiple Vitamins-Minerals (MULTIVITAMIN PO) Take 1 tablet by mouth daily. With calcium      . nebivolol (BYSTOLIC) 10 MG tablet Take 10 mg by mouth daily.      . niacin (NIASPAN) 750 MG CR tablet Take 1,500 mg by mouth at bedtime.      . NONFORMULARY OR COMPOUNDED ITEM Place 1.25 mLs onto the skin 3 (three) times a week. Topical testosterone propionate 1% in petrolatum Use small amount externally three times weekly  60 each  1  . Omega-3-acid Ethyl Esters (LOVAZA PO) Take 2 g by mouth 2 (two) times daily.       . tamoxifen (NOLVADEX) 20 MG tablet Take 1 tablet (20 mg total) by mouth daily.  90 tablet  4  . zolpidem (AMBIEN CR) 6.25 MG CR tablet Take 1 tablet (6.25 mg total) by mouth at bedtime as needed for sleep.  90 tablet  1   No current facility-administered medications for this visit.    OBJECTIVE: Middle-aged white woman in no acute distress Filed Vitals:   07/20/13 1400  BP: 127/86  Pulse: 89  Temp: 98.7 F (37.1 C)  Resp: 18     Body mass index is 26.81 kg/(m^2).    ECOG FS: 0  Sclerae unicteric Oropharynx clear No cervical or supraclavicular adenopathy Lungs no rales or rhonchi Heart regular rate and rhythm Abd soft, nontender, positive bowel sounds MSK no focal spinal tenderness, no upper extremity lymphedema Neuro: nonfocal, well-oriented,  positive affect Breasts: The right breast is unremarkable. The left breast is status post lumpectomy and radiation. There is no evidence of local recurrence. The left axilla is benign.  LAB RESULTS: Lab Results  Component Value Date   WBC 3.9* 07/13/2013   NEUTROABS 3.1 07/07/2012   HGB 13.2 07/13/2013   HCT 39.2 07/13/2013   MCV 95.6 07/13/2013   PLT 218 07/13/2013      Chemistry      Component Value Date/Time   NA 140 07/13/2013 0821   NA 138 07/07/2012 1505   K 4.5 07/13/2013 5726  K 3.8 07/07/2012 1505   CL 105 07/13/2013 0821   CL 105 07/07/2012 1505   CO2 25 07/13/2013 0821   CO2 21* 07/07/2012 1505   BUN 19 07/13/2013 0821   BUN 20.0 07/07/2012 1505   CREATININE 0.73 07/13/2013 0821   CREATININE 0.8 07/07/2012 1505   CREATININE 0.56 07/17/2011 1455      Component Value Date/Time   CALCIUM 9.2 07/13/2013 0821   CALCIUM 9.4 07/07/2012 1505   ALKPHOS 44 07/13/2013 0821   ALKPHOS 99 07/07/2012 1505   AST 27 07/13/2013 0821   AST 35* 07/07/2012 1505   ALT 33 07/13/2013 0821   ALT 53 07/07/2012 1505   BILITOT 0.8 07/13/2013 0821   BILITOT 1.00 07/07/2012 1505       Lab Results  Component Value Date   LABCA2 15 07/17/2011    No components found with this basename: [id number]    No results found for this basename: INR,  in the last 168 hours  Urinalysis No results found for this basename: colorurine,  appearanceur,  labspec,  phurine,  glucoseu,  hgbur,  bilirubinur,  ketonesur,  proteinur,  urobilinogen,  nitrite,  leukocytesur    STUDIES: Mr Breast Bilateral W Wo Contrast  07/17/2013   *RADIOLOGY REPORT*  Clinical Data:57 year old female with history of left breast cancer, left lumpectomy, radiation chemotherapy in 2005.  Follow- up.  BILATERAL BREAST MRI WITH AND WITHOUT CONTRAST  Technique: Multiplanar, multisequence MR images of both breasts were obtained prior to and following the intravenous administration of 66m of Multihance.  THREE-DIMENSIONAL MR IMAGE RENDERING ON  INDEPENDENT WORKSTATION: Three-dimensional MR images were rendered by post-processing  of the original MR data on an independent DynaCad workstation. The three-dimensional MR images were interpreted, and findings are reported in the following complete MRI report for this study.  Comparison:  07/14/2013 and prior mammograms.  07/13/2011 MRI  FINDINGS:  Breast composition:  b: scattered areas of fibroglandular density  Background parenchymal enhancement: Minimal  Right breast:  No mass or abnormal enhancement.  Left breast:  No mass or abnormal enhancement. Breast scarring is again identified.  Lymph nodes:  No abnormal appearing lymph nodes.  Ancillary findings:  Cardiomegaly and a hepatic cyst again noted.  IMPRESSION: No MR evidence of breast malignancy.  Left breast scarring.  BI-RADS CATEGORY 2:  Benign finding(s).  RECOMMENDATION: Bilateral screening mammograms in 1 year.   Original Report Authenticated By: JMargarette Canada M.D.   Mm Digital Diagnostic Bilat  07/14/2013   *RADIOLOGY REPORT*  Clinical Data:  Malignant lumpectomy of the left breast in September, 2005 with subsequent radiation therapy and presurgical chemotherapy.  The patient is currently on Arimidex therapy. Annual evaluation.  DIGITAL DIAGNOSTIC BILATERAL MAMMOGRAM WITH CAD  Comparison: 07/12/2012, 07/09/2011, dating back to 12/28/2005.  Findings:  ACR Breast Density Category b:  There are scattered areas of fibroglandular density.  CC and MLO views of both breasts were obtained.  Post lumpectomy scarring and associated dystrophic calcifications of fat necrosis in the upper left breast.  There are no findings suspicious for malignancy in either breast.  Mammographic images were processed with CAD.  IMPRESSION: No specific mammographic evidence of malignancy.  Expected post lumpectomy changes in the upper left breast.  RECOMMENDATION: As the patient is greater than 7 years out from her lumpectomy, she may return to annual screening mammography  unless there are continued clinical concerns.  I have discussed the findings and recommendations with the patient. Results were also provided in writing at the conclusion  of the visit.  If applicable, a reminder letter will be sent to the patient regarding her next appointment.  BI-RADS CATEGORY 2:  Benign finding(s).   Original Report Authenticated By: Evangeline Dakin, M.D.    Clinical Data: Osteoporosis screening. History of Arimidex switch  to tamoxifen September 2013 and still on Tamoxifen currently. Both  hips evaluated per physician request.  DUAL X-RAY ABSORPTIOMETRY (DXA) FOR BONE MINERAL DENSITY  AP LUMBAR SPINE L1, L3 AND L4  Bone Mineral Density (BMD): 0.806 g/cm2  Young Adult T Score: -2.2  Z Score: -1.2  LEFT FEMUR NECK:  Bone Mineral Density (BMD): 0.642 g/cm2  Young Adult T Score: -1.9  Z Score: -0.8  RIGHT FEMORAL NECK:  Bone Mineral Density (BMD): 0.587 g/cm2  Young Adult T Score: -2.4  Z Score: -1.3  ASSESSMENT: Patient's diagnostic category is LOW BONE MASS by WHO  Criteria.  FRACTURE RISK: INCREASED  FRAX: Based on the [place] model, the 10  year probability of a major osteoporotic fracture is 14%. The 10  year probability of a hip fracture is 2.6%.  Comparison: 6.4% decrease in bone density over the lumbar spine  compared to 06/01/2011. 9.2% decrease in bone density over the  total left hip compared to 2012. 10.3% decrease in bone density  over the total right hip compared to 2012.   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 Shelby Dubin  (3) on tamoxifen between May  2006 and May 2011, at which point she switched to anastrozole, switching back to tamoxifen September 2013.   PLAN: [place] is tolerating the tamoxifen well, and the plan is to continue that at least 2 more years. I have encouraged her to use the Vagifem suppositories more frequently, and I am also referring her to our new rehabilitation program on intimacy issues. Vaughan Basta is interested in discussing other issues with our counselor Phineas Inches that also is being arranged. Otherwise Wanamaker will return to see me in one year. We will repeat physical exam labs and mammography at that visit. She knows to call for any problems that may develop before then.  (Incidentally she received her flu shot today).  MAGRINAT,GUSTAV C    07/20/2013

## 2013-07-23 ENCOUNTER — Other Ambulatory Visit: Payer: Self-pay | Admitting: Oncology

## 2013-07-24 ENCOUNTER — Encounter: Payer: Self-pay | Admitting: Cardiovascular Disease

## 2013-07-24 NOTE — Addendum Note (Signed)
Addended by: Billey Co on: 07/24/2013 04:58 PM   Modules accepted: Orders

## 2013-07-25 ENCOUNTER — Ambulatory Visit (INDEPENDENT_AMBULATORY_CARE_PROVIDER_SITE_OTHER): Payer: BC Managed Care – PPO | Admitting: Cardiovascular Disease

## 2013-07-25 ENCOUNTER — Encounter: Payer: Self-pay | Admitting: Cardiovascular Disease

## 2013-07-25 ENCOUNTER — Telehealth: Payer: Self-pay | Admitting: Oncology

## 2013-07-25 VITALS — BP 110/80 | HR 67 | Ht 63.5 in | Wt 150.7 lb

## 2013-07-25 DIAGNOSIS — M810 Age-related osteoporosis without current pathological fracture: Secondary | ICD-10-CM | POA: Insufficient documentation

## 2013-07-25 DIAGNOSIS — R002 Palpitations: Secondary | ICD-10-CM

## 2013-07-25 DIAGNOSIS — E785 Hyperlipidemia, unspecified: Secondary | ICD-10-CM | POA: Insufficient documentation

## 2013-07-25 NOTE — Patient Instructions (Addendum)
Your physician recommends that you schedule a follow-up appointment in: 1 YEAR. No changes were made today.

## 2013-07-25 NOTE — Telephone Encounter (Signed)
lmonvm for pt re lb/GM 07/09/14 and mailed schedule. Also s/w Kesha @ [facility] and pt has been contacted re therapy and will call them back when she is ready to schedule. This information is also documented in referral that pt was contacted by Izora Gala @ [facility] ([email or web address]) re appt @ [place] location as that is the location pt needs appt at. gv pt info for [place] [facility] in message.

## 2013-07-25 NOTE — Progress Notes (Signed)
Patient ID: Jessica Mack, female   DOB: [birth year], 57 y.o.   MRN: [id number]     HPI: Jessica Mack, is a 57 y.o. female who presents to the office today for a six-month cardiology evaluation.  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 She had been treated in the past with Adriamycin Cytoxan and additional therapy. I had seen her Initially after she experienced episodes of labile blood pressure as well as palpitations. She also has a history of hyperlipidemia. In the past, she did develop LFT elevation on leading to his discontinuance her palpitations have significantly improved with the addition of low-dose diastolic which she has tolerated well. Presently, she denies any further palpitations. She has been on a regimen with reference to her hyperlipidemia including Zetia 10 mg Niaspan 750 mg and low vase of 2 capsules daily. She recently underwent a followup NMR profile which is markedly improved from her previous one with almost 50% reduction in values. Her LDL particle numbers now 859 down from 1537, small LDL particle #538, down from 947. Her total cholesterol is improved from 200 to 141. He states he recently had a bone density scan and was told of having osteoporosis particularly involving her lumbar vertebrae.  Past Medical History  Diagnosis Date  . Peripheral neuropathy   . Hypertriglyceridemia   . History of breast cancer 2005    invasive ductal cancer left breast  . Blood transfusion   . GERD (gastroesophageal reflux disease)   . Ulcer   . Tongue dysplasia   . Irritable bowel syndrome   . Migraines   . Hypertension   . Avascular necrosis of bone     Left talus  . Osteopenia   . Dysplastic nevus 1988    right hip  . Plantar fasciitis 6/04  . Metabolic syndrome     resolved with diet and exercise  . Palpitations 12/13    normal echo    Past Surgical History    Procedure Laterality Date  . Wrist surgery  1988    right, DeQuervains  . Cesarean section  1991, 1993    x2  . Laparoscopic cholecystectomy  2003  . Breast lumpectomy  06/2004    left, with port placement  . Ankle surgery  07/2007    left, revascularization  . Shoulder arthroscopy  10/2005    right  . Tongue surgery  2012, 2013    Allergies  Allergen Reactions  . Augmentin [Amoxicillin-Pot Clavulanate]     abd cramping  . Crestor [Rosuvastatin]     Liver function elevated     Current Outpatient Prescriptions  Medication Sig Dispense Refill  . aspirin 81 MG tablet Take 81 mg by mouth daily. 2 tablets daily      . clonazePAM (KLONOPIN) 1 MG tablet Take 0.5 mg by mouth at bedtime as needed.       . Estradiol (VAGIFEM) 10 MCG TABS Place 10 mcg vaginally. Twice monthly      . ezetimibe (ZETIA) 10 MG tablet Take 1 tablet (10 mg total) by mouth daily.  90 tablet  3  . Multiple Vitamins-Minerals (MULTIVITAMIN PO) Take 1 tablet by mouth daily. With calcium      . nebivolol (BYSTOLIC) 10 MG tablet Take 10 mg by mouth daily.      . niacin (NIASPAN) 750 MG CR tablet Take 750 mg by mouth at bedtime.       Marland Kitchen  NONFORMULARY OR COMPOUNDED ITEM Place 1.25 mLs onto the skin 3 (three) times a week. weekly      . Omega-3-acid Ethyl Esters (LOVAZA PO) Take 2 g by mouth 2 (two) times daily.       . tamoxifen (NOLVADEX) 20 MG tablet Take 1 tablet (20 mg total) by mouth daily.  90 tablet  4  . zolpidem (AMBIEN CR) 6.25 MG CR tablet Take 1 tablet (6.25 mg total) by mouth at bedtime as needed for sleep.  90 tablet  1   No current facility-administered medications for this visit.    History   Social History  . Marital Status: Married    Spouse Name: N/A    Number of Children: 2  . Years of Education: MD   Occupational History  .  [employer] History Main Topics  . Smoking status: Never Smoker   . Smokeless tobacco: Never Used  . Alcohol Use: 1.2 oz/week    2 Glasses of wine  per week     Comment: wine daily  . Drug Use: No  . Sexual Activity: Yes    Partners: Male    Birth Control/ Protection: Post-menopausal   Other Topics Concern  . Not on file   Social History Narrative   Retired OB/GYN physician    Family History  Problem Relation Age of Onset  . Asthma Brother   . Asthma Sister   . Cancer Mother     melanoma  . Cancer Father     colon    ROS is negative for fevers, chills or night sweats. She denies palpitations. She denies visual symptoms. She denies shortness of breath. She does admit to some fatigue. She denies abdominal pain. She denies bleeding. She denies edema. There is no claudication.  Other system review is negative.  PE BP 110/80  Pulse 67  Ht 5' 3.5" (1.613 m)  Wt 150 lb 11.2 oz (68.357 kg)  BMI 26.27 kg/m2  LMP 06/26/2004  General: Alert, oriented, no distress.  Skin: normal turgor, no rashes HEENT: Normocephalic, atraumatic. Pupils round and reactive; sclera anicteric;no lid lag.  Nose without nasal septal hypertrophy Mouth/Parynx benign; Mallinpatti scale 2 Neck: No JVD, no carotid briuts Lungs: clear to ausculatation and percussion; no wheezing or rales Heart: RRR, s1 s2 normal whiff of a systolic murmur,unchanged Abdomen: soft, nontender; no hepatosplenomehaly, BS+; abdominal aorta nontender and not dilated by palpation. Pulses 2+ Extremities: no clubbing cyanosis or edema, Homan's sign negative  Neurologic: grossly nonfocal  ECG: Sinus rhythm at 67 beats per minute. QTc interval 414 ms.  LABS:  BMET    Component Value Date/Time   NA 140 07/13/2013 0821   NA 138 07/07/2012 1505   K 4.5 07/13/2013 0821   K 3.8 07/07/2012 1505   CL 105 07/13/2013 0821   CL 105 07/07/2012 1505   CO2 25 07/13/2013 0821   CO2 21* 07/07/2012 1505   GLUCOSE 91 07/13/2013 0821   GLUCOSE 106* 07/07/2012 1505   BUN 19 07/13/2013 0821   BUN 20.0 07/07/2012 1505   CREATININE 0.73 07/13/2013 0821   CREATININE 0.8 07/07/2012 1505   CREATININE  0.56 07/17/2011 1455   CALCIUM 9.2 07/13/2013 0821   CALCIUM 9.4 07/07/2012 1505     Hepatic Function Panel     Component Value Date/Time   PROT 6.2 07/13/2013 0821   PROT 6.9 07/07/2012 1505   ALBUMIN 4.2 07/13/2013 0821   ALBUMIN 4.0 07/07/2012 1505   AST 27 07/13/2013 0821  AST 35* 07/07/2012 1505   ALT 33 07/13/2013 0821   ALT 53 07/07/2012 1505   ALKPHOS 44 07/13/2013 0821   ALKPHOS 99 07/07/2012 1505   BILITOT 0.8 07/13/2013 0821   BILITOT 1.00 07/07/2012 1505     CBC    Component Value Date/Time   WBC 3.9* 07/13/2013 0821   WBC 5.2 07/07/2012 1505   RBC 4.10 07/13/2013 0821   RBC 4.40 07/07/2012 1505   HGB 13.2 07/13/2013 0821   HGB 14.1 07/07/2012 1505   HCT 39.2 07/13/2013 0821   HCT 40.2 07/07/2012 1505   PLT 218 07/13/2013 0821   PLT 210 07/07/2012 1505   MCV 95.6 07/13/2013 0821   MCV 91.4 07/07/2012 1505   MCH 32.2 07/13/2013 0821   MCH 32.0 07/07/2012 1505   MCHC 33.7 07/13/2013 0821   MCHC 35.1 07/07/2012 1505   RDW 13.4 07/13/2013 0821   RDW 12.0 07/07/2012 1505   LYMPHSABS 1.5 07/07/2012 1505   MONOABS 0.4 07/07/2012 1505   EOSABS 0.1 07/07/2012 1505   BASOSABS 0.0 07/07/2012 1505     BNP No results found for this basename: probnp    Lipid Panel     Component Value Date/Time   TRIG 144 07/13/2013 0823   LDLCALC 53 07/13/2013 0823     RADIOLOGY: Mr Breast Bilateral W Wo Contrast  07/17/2013   *RADIOLOGY REPORT*  Clinical Data:57 year old female with history of left breast cancer, left lumpectomy, radiation chemotherapy in 2005.  Follow- up.  BILATERAL BREAST MRI WITH AND WITHOUT CONTRAST  Technique: Multiplanar, multisequence MR images of both breasts were obtained prior to and following the intravenous administration of 53m of Multihance.  THREE-DIMENSIONAL MR IMAGE RENDERING ON INDEPENDENT WORKSTATION: Three-dimensional MR images were rendered by post-processing  of the original MR data on an independent DynaCad workstation. The three-dimensional MR images were  interpreted, and findings are reported in the following complete MRI report for this study.  Comparison:  07/14/2013 and prior mammograms.  07/13/2011 MRI  FINDINGS:  Breast composition:  b: scattered areas of fibroglandular density  Background parenchymal enhancement: Minimal  Right breast:  No mass or abnormal enhancement.  Left breast:  No mass or abnormal enhancement. Breast scarring is again identified.  Lymph nodes:  No abnormal appearing lymph nodes.  Ancillary findings:  Cardiomegaly and a hepatic cyst again noted.  IMPRESSION: No MR evidence of breast malignancy.  Left breast scarring.  BI-RADS CATEGORY 2:  Benign finding(s).  RECOMMENDATION: Bilateral screening mammograms in 1 year.   Original Report Authenticated By: JMargarette Canada M.D.   Mm Digital Diagnostic Bilat  07/14/2013   *RADIOLOGY REPORT*  Clinical Data:  Malignant lumpectomy of the left breast in September, 2005 with subsequent radiation therapy and presurgical chemotherapy.  The patient is currently on Arimidex therapy. Annual evaluation.  DIGITAL DIAGNOSTIC BILATERAL MAMMOGRAM WITH CAD  Comparison: 07/12/2012, 07/09/2011, dating back to 12/28/2005.  Findings:  ACR Breast Density Category b:  There are scattered areas of fibroglandular density.  CC and MLO views of both breasts were obtained.  Post lumpectomy scarring and associated dystrophic calcifications of fat necrosis in the upper left breast.  There are no findings suspicious for malignancy in either breast.  Mammographic images were processed with CAD.  IMPRESSION: No specific mammographic evidence of malignancy.  Expected post lumpectomy changes in the upper left breast.  RECOMMENDATION: As the patient is greater than 7 years out from her lumpectomy, she may return to annual screening mammography unless there are continued clinical concerns.  I have discussed the findings and recommendations with the patient. Results were also provided in writing at the conclusion of the visit.  If  applicable, a reminder letter will be sent to the patient regarding her next appointment.  BI-RADS CATEGORY 2:  Benign finding(s).   Original Report Authenticated By: Evangeline Dakin, M.D.      ASSESSMENT AND PLAN: Brayton El, Shameca is feeling well. She is tolerating low-dose Bystolic without recurrent palpitations. Her blood pressure is well-controlled. The addition of Zetia added to her Niaspan and low vase for has markedly improved her lipid status by virtue of her most recent NMR profile. She will followup with Dr. Forde Dandy concerning her osteoporosis. She will continue her current therapy. I'll see her in one year for followup evaluation or sooner if problems arise.     Troy Sine, MD, [facility]  07/25/2013 6:43 PM

## 2013-07-31 DIAGNOSIS — Z0289 Encounter for other administrative examinations: Secondary | ICD-10-CM

## 2013-08-04 ENCOUNTER — Ambulatory Visit (INDEPENDENT_AMBULATORY_CARE_PROVIDER_SITE_OTHER): Payer: BC Managed Care – PPO | Admitting: Licensed Clinical Social Worker

## 2013-08-04 DIAGNOSIS — F4322 Adjustment disorder with anxiety: Secondary | ICD-10-CM

## 2013-08-11 ENCOUNTER — Telehealth: Payer: Self-pay

## 2013-08-11 ENCOUNTER — Ambulatory Visit (INDEPENDENT_AMBULATORY_CARE_PROVIDER_SITE_OTHER): Payer: BC Managed Care – PPO | Admitting: Licensed Clinical Social Worker

## 2013-08-11 DIAGNOSIS — F4322 Adjustment disorder with anxiety: Secondary | ICD-10-CM

## 2013-08-11 NOTE — Telephone Encounter (Signed)
I called to let patient know I will be mailing out [employer] forms to her today. I also noted that she has not scheduled her next appointment. Next appointment scheduled for February 12, 2014 at 2:30 p.m.

## 2013-08-24 ENCOUNTER — Telehealth: Payer: Self-pay | Admitting: Obstetrics & Gynecology

## 2013-08-24 MED ORDER — ZOLPIDEM TARTRATE ER 6.25 MG PO TBCR: 6.2500 mg | EXTENDED_RELEASE_TABLET | Freq: Every evening | ORAL | Status: DC | PRN

## 2013-08-24 NOTE — Telephone Encounter (Signed)
Patient has spoke with Dr. Sabra Heck in regards to her bone density. Needs to discuss bone density with Dr. Sabra Heck. She needs a refill for Ambien through [facility]. She also would like samples of vagifem tablets (80mcrograms).

## 2013-08-24 NOTE — Telephone Encounter (Signed)
Left msg for patient regarding BMD and that I will attempt to reach her tomorrow.

## 2013-08-24 NOTE — Telephone Encounter (Signed)
VM confirms name, LM our office no longer has samples but will take care of other requests. LMTCB.  Ambien generic 6.25mg  #90, 1 po @[facility]  prn sleep with one refill called to Elnita Maxwell at [facility].

## 2013-08-25 ENCOUNTER — Ambulatory Visit (INDEPENDENT_AMBULATORY_CARE_PROVIDER_SITE_OTHER): Payer: BC Managed Care – PPO | Admitting: Licensed Clinical Social Worker

## 2013-08-25 DIAGNOSIS — F4322 Adjustment disorder with anxiety: Secondary | ICD-10-CM

## 2013-08-25 MED ORDER — ESTRADIOL 10 MCG VA TABS: ORAL_TABLET | VAGINAL | Status: DC

## 2013-08-25 NOTE — Telephone Encounter (Signed)
Spoke with pt personally.  All questions answered.  She is considering options for treatment vs waiting until next year after off Tamoxifen and starting Evista.  Will let me know if has other questions.

## 2013-08-28 ENCOUNTER — Other Ambulatory Visit: Payer: Self-pay | Admitting: Orthopedic Surgery

## 2013-09-05 ENCOUNTER — Encounter: Payer: Self-pay | Admitting: Oncology

## 2013-09-11 ENCOUNTER — Other Ambulatory Visit: Payer: Self-pay | Admitting: Oncology

## 2013-09-11 ENCOUNTER — Ambulatory Visit (INDEPENDENT_AMBULATORY_CARE_PROVIDER_SITE_OTHER): Payer: BC Managed Care – PPO | Admitting: Licensed Clinical Social Worker

## 2013-09-11 DIAGNOSIS — F4322 Adjustment disorder with anxiety: Secondary | ICD-10-CM

## 2013-09-12 ENCOUNTER — Other Ambulatory Visit: Payer: Self-pay | Admitting: Dermatology

## 2013-09-25 HISTORY — PX: COLONOSCOPY: SHX174

## 2013-10-02 ENCOUNTER — Other Ambulatory Visit: Payer: Self-pay | Admitting: Obstetrics & Gynecology

## 2013-10-02 NOTE — Telephone Encounter (Signed)
Clonazepam 1 mg /90 day Patient request refills sent to [facility].

## 2013-10-03 MED ORDER — CLONAZEPAM 1 MG PO TABS: 0.5000 mg | ORAL_TABLET | Freq: Every evening | ORAL | Status: DC | PRN

## 2013-10-03 NOTE — Telephone Encounter (Signed)
Annual exam 02/2013 Rx was not given, but was on medication list pt. Asking for refill. Please advise.

## 2014-02-06 ENCOUNTER — Other Ambulatory Visit: Payer: Self-pay | Admitting: Cardiovascular Disease

## 2014-02-06 NOTE — Telephone Encounter (Signed)
Rx was sent to pharmacy electronically. 

## 2014-02-12 ENCOUNTER — Ambulatory Visit: Payer: Self-pay | Admitting: Neurology

## 2014-02-13 ENCOUNTER — Encounter: Payer: Self-pay | Admitting: Neurology

## 2014-02-13 ENCOUNTER — Ambulatory Visit (INDEPENDENT_AMBULATORY_CARE_PROVIDER_SITE_OTHER): Payer: BC Managed Care – PPO | Admitting: Neurology

## 2014-02-13 VITALS — BP 118/84 | HR 64 | Wt 159.0 lb

## 2014-02-13 DIAGNOSIS — Z853 Personal history of malignant neoplasm of breast: Secondary | ICD-10-CM

## 2014-02-13 DIAGNOSIS — G620 Drug-induced polyneuropathy: Secondary | ICD-10-CM

## 2014-02-13 NOTE — Patient Instructions (Signed)

## 2014-02-13 NOTE — Progress Notes (Signed)
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 At the last visit the patient was started on a topical medication and she states that it helped with the sharp stabbing pain but it made her hands and feet more numb, so she stopped using it. The patient has been living with pain for ten years and she has adjusted her life around it. She went off her Ambien and probably gets restorative sleep at least three times a week. Now that she is no longer on Ambien, she is open to trying the topical cream again. She is not interested in trying a new medication at this time. Since the last visit she was diagnosed with severe osteopenia, currently only taking calcium and doing low impact exercise, and she has not decided if she is going to take medication for this.   REVIEW OF SYSTEMS: Full 14 system review of systems performed and notable only for:  Constitutional: N/A  Eyes: N/A Ear/Nose/Throat: ringing in the ears, trouble swallowing Skin: N/A  Cardiovascular: N/A  Respiratory: N/A  Gastrointestinal: N/A  Genitourinary: N/A Hematology/Lymphatic: N/A  Endocrine: N/A Musculoskeletal:N/A  Allergy/Immunology: N/A  Neurological: numbness Psychiatric: N/A Sleep:frequent waking, snoring    ALLERGIES: Allergies  Allergen Reactions  . Augmentin [Amoxicillin-Pot Clavulanate]     abd cramping  . Crestor [Rosuvastatin]     Liver function elevated     HOME MEDICATIONS: Outpatient Prescriptions Prior to Visit  Medication Sig Dispense Refill  . aspirin 81 MG tablet Take 81 mg by mouth daily. 2 tablets daily      . BYSTOLIC 10 MG tablet TAKE 1 BY MOUTH DAILY  90 tablet  1  . clonazePAM (KLONOPIN) 1 MG tablet Take 0.5 tablets (0.5 mg total) by mouth at bedtime as needed.  90 tablet  1  . Estradiol  (VAGIFEM) 10 MCG TABS Place 10 mcg vaginally. Twice monthly      . ezetimibe (ZETIA) 10 MG tablet Take 1 tablet (10 mg total) by mouth daily.  90 tablet  3  . Multiple Vitamins-Minerals (MULTIVITAMIN PO) Take 1 tablet by mouth daily. With calcium      . niacin (NIASPAN) 750 MG CR tablet Take 750 mg by mouth at bedtime.       . NONFORMULARY OR COMPOUNDED ITEM Place 1.25 mLs onto the skin 3 (three) times a week. weekly      . Omega-3-acid Ethyl Esters (LOVAZA PO) Take 2 g by mouth 2 (two) times daily.       . tamoxifen (NOLVADEX) 20 MG tablet Take 1 tablet (20 mg total) by mouth daily.  90 tablet  4  . Estradiol 10 MCG TABS vaginal tablet 1 tablet vaginally nightly x 14 days, then twice weekly.  Dispense #90 day supply  34 tablet  4  . zolpidem (AMBIEN CR) 6.25 MG CR tablet Take 1 tablet (6.25 mg total) by mouth at bedtime as needed for sleep.  90 tablet  1   No facility-administered medications prior to visit.    PAST MEDICAL HISTORY: Past Medical History  Diagnosis Date  . Peripheral neuropathy   . Hypertriglyceridemia   . History of breast cancer 2005    invasive ductal cancer left breast  . Blood transfusion   . GERD (gastroesophageal reflux disease)   . Ulcer   . Tongue  dysplasia   . Irritable bowel syndrome   . Migraines   . Hypertension   . Avascular necrosis of bone     Left talus  . Osteopenia   . Dysplastic nevus 1988    right hip  . Plantar fasciitis 6/04  . Metabolic syndrome     resolved with diet and exercise  . Palpitations 12/13    normal echo    PAST SURGICAL HISTORY: Past Surgical History  Procedure Laterality Date  . Wrist surgery  1988    right, DeQuervains  . Cesarean section  1991, 1993    x2  . Laparoscopic cholecystectomy  2003  . Breast lumpectomy  06/2004    left, with port placement  . Ankle surgery  07/2007    left, revascularization  . Shoulder arthroscopy  10/2005    right  . Tongue surgery  2012, 2013    FAMILY HISTORY: Family  History  Problem Relation Age of Onset  . Asthma Brother   . Asthma Sister   . Cancer Mother     melanoma  . Cancer Father     colon    SOCIAL HISTORY: History   Social History  . Marital Status: Married    Spouse Name: N/A    Number of Children: 2  . Years of Education: MD   Occupational History  .  [place] History Main Topics  . Smoking status: Never Smoker   . Smokeless tobacco: Never Used  . Alcohol Use: 1.2 oz/week    2 Glasses of wine per week     Comment: wine daily  . Drug Use: No  . Sexual Activity: Yes    Partners: Male    Birth Control/ Protection: Post-menopausal   Other Topics Concern  . Not on file   Social History Narrative   Retired OB/GYN physician      PHYSICAL EXAM  Filed Vitals:   02/13/14 0913  BP: 118/84  Pulse: 64  Weight: 159 lb (72.122 kg)   Body mass index is 27.72 kg/(m^2).  Generalized: Well developed, in no acute distress    Neurological examination  Mentation: Alert oriented to time, place, history taking. Follows all commands speech and language fluent Cranial nerve II-XII:  Extraocular movements were full, visual field were full on confrontational test.  Motor: The motor testing reveals 5 over 5 strength of all 4 extremities. Good symmetric motor tone is noted throughout.  Sensory: Sensory testing is intact to soft touch on all 4 extremities. Decreased to pinprick in bilateral lower extremities. No evidence of extinction is noted.  Coordination: Cerebellar testing reveals good finger-nose-finger and heel-to-shin bilaterally.  Gait and station: Gait is normal. Tandem gait is unsteady. Romberg is negative. No drift is seen.  Reflexes: Deep tendon reflexes are symmetric but depressed bilaterally.   DIAGNOSTIC DATA (LABS, IMAGING, TESTING) - I reviewed patient records, labs, notes, testing and imaging myself where available.  Lab Results  Component Value Date   WBC 3.9* 07/13/2013   HGB 13.2 07/13/2013    HCT 39.2 07/13/2013   MCV 95.6 07/13/2013   PLT 218 07/13/2013      Component Value Date/Time   NA 140 07/13/2013 0821   NA 138 07/07/2012 1505   K 4.5 07/13/2013 0821   K 3.8 07/07/2012 1505   CL 105 07/13/2013 0821   CL 105 07/07/2012 1505   CO2 25 07/13/2013 0821   CO2 21* 07/07/2012 1505   GLUCOSE 91 07/13/2013 0821   GLUCOSE  106* 07/07/2012 1505   BUN 19 07/13/2013 0821   BUN 20.0 07/07/2012 1505   CREATININE 0.73 07/13/2013 0821   CREATININE 0.8 07/07/2012 1505   CREATININE 0.56 07/17/2011 1455   CALCIUM 9.2 07/13/2013 0821   CALCIUM 9.4 07/07/2012 1505   PROT 6.2 07/13/2013 0821   PROT 6.9 07/07/2012 1505   ALBUMIN 4.2 07/13/2013 0821   ALBUMIN 4.0 07/07/2012 1505   AST 27 07/13/2013 0821   AST 35* 07/07/2012 1505   ALT 33 07/13/2013 0821   ALT 53 07/07/2012 1505   ALKPHOS 44 07/13/2013 0821   ALKPHOS 99 07/07/2012 1505   BILITOT 0.8 07/13/2013 0821   BILITOT 1.00 07/07/2012 1505   Lab Results  Component Value Date   LDLCALC 53 07/13/2013   TRIG 144 07/13/2013   Lab Results  Component Value Date   HGBA1C 5.1 07/17/2011   Lab Results  Component Value Date   UVOZDGUY40 347 07/17/2011   No results found for this basename: TSH      ASSESSMENT AND PLAN 58 y.o. year old female  has a past medical history of Peripheral neuropathy; Hypertriglyceridemia; History of breast cancer (2005); Blood transfusion; GERD (gastroesophageal reflux disease); Ulcer; Tongue dysplasia; Irritable bowel syndrome; Migraines; Hypertension; Avascular necrosis of bone; Osteopenia; Dysplastic nevus (1988); Plantar fasciitis (6/04); Metabolic syndrome; and Palpitations (12/13). here with:  1. 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 History of breast cancer  The patient continues to have issues with neuropathy in both hands and feet. At this time patient would like to restart the topical cream for peripheral neuropathy. At this time she does not want to try an oral medication. She will follow up in one year or sooner if  needed.   Ward Givens, MSN, NP-C 02/13/2014, 9:17 AM [facility] [address], [place], [place] [address] [phone]  Note: This document was prepared with digital dictation and possible smart phrase technology. Any transcriptional errors that result from this process are unintentional. Kathrynn Ducking

## 2014-02-24 ENCOUNTER — Other Ambulatory Visit: Payer: Self-pay | Admitting: Cardiovascular Disease

## 2014-02-26 NOTE — Telephone Encounter (Signed)
Rx refill sent to patient pharmacy   

## 2014-02-28 ENCOUNTER — Ambulatory Visit (INDEPENDENT_AMBULATORY_CARE_PROVIDER_SITE_OTHER): Payer: BC Managed Care – PPO | Admitting: General Surgery

## 2014-02-28 ENCOUNTER — Encounter (INDEPENDENT_AMBULATORY_CARE_PROVIDER_SITE_OTHER): Payer: Self-pay | Admitting: General Surgery

## 2014-02-28 VITALS — BP 120/80 | HR 78 | Temp 97.2°F | Resp 14 | Wt 158.6 lb

## 2014-02-28 DIAGNOSIS — C50919 Malignant neoplasm of unspecified site of unspecified female breast: Secondary | ICD-10-CM

## 2014-02-28 NOTE — Progress Notes (Signed)
Procedure:  Left lumpectomy and left axilla sentinel lymph node biopsy  Date:  September 2005  Pathology: T1cN0, ER/PR positive  Hx:  She's here for long-term followup visit of her left breast cancer. She has not noted any new masses. She has not noted any adenopathy. Mammogram is due in September. She continues on tamoxifen. She is also being followed by Dr. Jana Hakim.  PE: Jessica Mack looks well and is in no acute distress   Breasts-Right breast:  No dominant masses, suspicious skin changes. Left breast:  Well-healed upper outer quadrant scar with firmness deep to it. Tattoo markings noted. No palpable masses.  Lymph nodes-No palpable cervical, supraclavicular, or axillary adenopathy.  Assessment:  Invasive left breast cancer status post breast conservation treatment-no clinical evidence of recurrence.  Plan:  I would be glad to see her back in one year for reevaluation.

## 2014-02-28 NOTE — Patient Instructions (Signed)
Call if you find any new lumps in your breasts or chest wall.

## 2014-03-12 ENCOUNTER — Telehealth: Payer: Self-pay | Admitting: Cardiovascular Disease

## 2014-03-12 DIAGNOSIS — Z79899 Other long term (current) drug therapy: Secondary | ICD-10-CM

## 2014-03-12 DIAGNOSIS — R5383 Other fatigue: Secondary | ICD-10-CM

## 2014-03-12 DIAGNOSIS — R5381 Other malaise: Secondary | ICD-10-CM

## 2014-03-12 DIAGNOSIS — E782 Mixed hyperlipidemia: Secondary | ICD-10-CM

## 2014-03-12 NOTE — Telephone Encounter (Signed)
Order placed for fasting labs.  Will have done Friday at [facility].

## 2014-03-12 NOTE — Telephone Encounter (Signed)
Please put in the order for the patient to have her lab work done.

## 2014-03-16 LAB — CBC
HCT: 40.7 % (ref 36.0–46.0)
Hemoglobin: 13.9 g/dL (ref 12.0–15.0)
MCH: 32.3 pg (ref 26.0–34.0)
MCHC: 34.2 g/dL (ref 30.0–36.0)
MCV: 94.4 fL (ref 78.0–100.0)
Platelets: 215 10*3/uL (ref 150–400)
RBC: 4.31 MIL/uL (ref 3.87–5.11)
RDW: 13 % (ref 11.5–15.5)
WBC: 3.9 10*3/uL — ABNORMAL LOW (ref 4.0–10.5)

## 2014-03-16 LAB — COMPREHENSIVE METABOLIC PANEL
ALT: 98 U/L — ABNORMAL HIGH (ref 0–35)
AST: 90 U/L — ABNORMAL HIGH (ref 0–37)
Albumin: 4.7 g/dL (ref 3.5–5.2)
Alkaline Phosphatase: 47 U/L (ref 39–117)
BUN: 13 mg/dL (ref 6–23)
CO2: 26 mEq/L (ref 19–32)
Calcium: 9.2 mg/dL (ref 8.4–10.5)
Chloride: 107 mEq/L (ref 96–112)
Creat: 0.76 mg/dL (ref 0.50–1.10)
Glucose, Bld: 97 mg/dL (ref 70–99)
Potassium: 4.3 mEq/L (ref 3.5–5.3)
Sodium: 143 mEq/L (ref 135–145)
Total Bilirubin: 0.6 mg/dL (ref 0.2–1.2)
Total Protein: 6.7 g/dL (ref 6.0–8.3)

## 2014-03-16 LAB — TSH: TSH: 1.648 u[IU]/mL (ref 0.350–4.500)

## 2014-03-19 LAB — VITAMIN D 1,25 DIHYDROXY
Vitamin D 1, 25 (OH)2 Total: 66 pg/mL (ref 18–72)
Vitamin D2 1, 25 (OH)2: <8 pg/mL
Vitamin D3 1, 25 (OH)2: 66 pg/mL

## 2014-03-22 ENCOUNTER — Other Ambulatory Visit: Payer: Self-pay | Admitting: Cardiovascular Disease

## 2014-03-22 ENCOUNTER — Other Ambulatory Visit: Payer: Self-pay | Admitting: *Deleted

## 2014-03-22 DIAGNOSIS — R7989 Other specified abnormal findings of blood chemistry: Secondary | ICD-10-CM

## 2014-03-22 NOTE — Patient Instructions (Signed)
Patient had elevated LFT'S on last blood draw. Dr. Claiborne Billings ordered repeat testing in 3 weeks. Labs ordered.

## 2014-03-22 NOTE — Telephone Encounter (Signed)
Rx refill sent to patient pharmacy   

## 2014-03-28 ENCOUNTER — Ambulatory Visit: Payer: BC Managed Care – PPO | Admitting: Obstetrics & Gynecology

## 2014-03-30 ENCOUNTER — Encounter: Payer: Self-pay | Admitting: Obstetrics & Gynecology

## 2014-03-30 ENCOUNTER — Ambulatory Visit (INDEPENDENT_AMBULATORY_CARE_PROVIDER_SITE_OTHER): Payer: BC Managed Care – PPO | Admitting: Obstetrics & Gynecology

## 2014-03-30 VITALS — BP 130/86 | HR 84 | Resp 18 | Ht 63.5 in | Wt 158.0 lb

## 2014-03-30 DIAGNOSIS — Z0000 Encounter for general adult medical examination without abnormal findings: Secondary | ICD-10-CM

## 2014-03-30 DIAGNOSIS — Z124 Encounter for screening for malignant neoplasm of cervix: Secondary | ICD-10-CM

## 2014-03-30 DIAGNOSIS — E782 Mixed hyperlipidemia: Secondary | ICD-10-CM

## 2014-03-30 DIAGNOSIS — Z01419 Encounter for gynecological examination (general) (routine) without abnormal findings: Secondary | ICD-10-CM

## 2014-03-30 LAB — POCT URINALYSIS DIPSTICK
Bilirubin, UA: NEGATIVE
Blood, UA: NEGATIVE
Glucose, UA: NEGATIVE
Ketones, UA: NEGATIVE
Leukocytes, UA: NEGATIVE
Nitrite, UA: NEGATIVE
Protein, UA: NEGATIVE
Urobilinogen, UA: NEGATIVE
pH, UA: 5

## 2014-03-30 MED ORDER — CLONAZEPAM 1 MG PO TABS: 1.0000 mg | ORAL_TABLET | Freq: Every day | ORAL | Status: DC

## 2014-03-30 MED ORDER — ESTRADIOL 10 MCG VA TABS: ORAL_TABLET | VAGINAL | Status: DC

## 2014-03-30 NOTE — Progress Notes (Signed)
58 y.o. G2P2 MarriedCaucasianF here for annual exam.  No vaginal bleeding.  On Zetia for cholesterol.  Labs aren't in EPIC.  TG's per pt were 600 range.  BMD shows severe osteopenia.  Dr. Forde Dandy has recommended Prolia.  Off Ambien.    Patient's last menstrual period was 06/26/2004.          Sexually active: yes  The current method of family planning is post menopausal status.    Exercising: yes  Weights, aerobics, pilates Smoker:  no  Health Maintenance: Pap:  03/17/13 Neg History of abnormal Pap:  yes MMG:  07/14/13 BIRADS2 Colonoscopy:  08/2013 Normal. - Every 5 years BMD:   06/07/13 TDaP:  2007 Screening Labs: PCP, Hb today: PCP, Urine today: none   reports that she has never smoked. She has never used smokeless tobacco. She reports that she drinks about 1.2 ounces of alcohol per week. She reports that she does not use illicit drugs.  Past Medical History  Diagnosis Date  . Peripheral neuropathy   . Hypertriglyceridemia   . History of breast cancer 2005    invasive ductal cancer left breast  . Blood transfusion   . GERD (gastroesophageal reflux disease)   . Ulcer   . Tongue dysplasia   . Irritable bowel syndrome   . Migraines   . Hypertension   . Avascular necrosis of bone     Left talus  . Osteopenia   . Dysplastic nevus 1988    right hip  . Plantar fasciitis 6/04  . Metabolic syndrome     resolved with diet and exercise  . Palpitations 12/13    normal echo    Past Surgical History  Procedure Laterality Date  . Wrist surgery  1988    right, DeQuervains  . Cesarean section  1991, 1993    x2  . Laparoscopic cholecystectomy  2003  . Breast lumpectomy  06/2004    left, with port placement  . Ankle surgery  07/2007    left, revascularization  . Shoulder arthroscopy  10/2005    right  . Tongue surgery  2012, 2013  . Colonoscopy  09/2013    Dr. Collene Mares    Current Outpatient Prescriptions  Medication Sig Dispense Refill  . BYSTOLIC 10 MG tablet TAKE 1 BY MOUTH DAILY   90 tablet  1  . clonazePAM (KLONOPIN) 1 MG tablet Take 0.5 tablets (0.5 mg total) by mouth at bedtime as needed.  90 tablet  1  . Estradiol (VAGIFEM) 10 MCG TABS Place 10 mcg vaginally. Twice monthly      . Multiple Vitamins-Minerals (MULTIVITAMIN PO) Take 1 tablet by mouth daily. With calcium      . NONFORMULARY OR COMPOUNDED ITEM Place 1.25 mLs onto the skin 3 (three) times a week. weekly      . Omega-3-acid Ethyl Esters (LOVAZA PO) Take 2 g by mouth 2 (two) times daily.       Marland Kitchen omeprazole (PRILOSEC) 10 MG capsule Take 10 mg by mouth as needed.      . ranitidine (ZANTAC) 150 MG tablet Take 150 mg by mouth as needed for heartburn.      . tamoxifen (NOLVADEX) 20 MG tablet Take 1 tablet (20 mg total) by mouth daily.  90 tablet  4  . ZETIA 10 MG tablet TAKE 1 BY MOUTH DAILY  90 tablet  1  . aspirin 81 MG tablet Take 81 mg by mouth daily. 2 tablets daily      . niacin (NIASPAN) 750  MG CR tablet Take 750 mg by mouth at bedtime.        No current facility-administered medications for this visit.    Family History  Problem Relation Age of Onset  . Asthma Brother   . Asthma Sister   . Cancer Mother     melanoma  . Cancer Father     colon    ROS:  Pertinent items are noted in HPI.  Otherwise, a comprehensive ROS was negative.  Exam:   BP 130/86  Pulse 84  Resp 18  Ht 5' 3.5" (1.613 m)  Wt 158 lb (71.668 kg)  BMI 27.55 kg/m2  LMP 06/26/2004  Weight change: +8LBS  Height: 5' 3.5" (161.3 cm)  Ht Readings from Last 3 Encounters:  03/30/14 5' 3.5" (1.613 m)  07/25/13 5' 3.5" (1.613 m)  07/20/13 5' 3.5" (1.613 m)    General appearance: alert, cooperative and appears stated age Head: Normocephalic, without obvious abnormality, atraumatic Neck: no adenopathy, supple, symmetrical, trachea midline and thyroid normal to inspection and palpation Lungs: clear to auscultation bilaterally Breasts: normal appearance, no masses or tenderness, thickened area around left breast scar.  Unchanged  from prior breast exams. Heart: regular rate and rhythm Abdomen: soft, non-tender; bowel sounds normal; no masses,  no organomegaly Extremities: extremities normal, atraumatic, no cyanosis or edema Skin: Skin color, texture, turgor normal. No rashes or lesions Lymph nodes: Cervical, supraclavicular, and axillary nodes normal. No abnormal inguinal nodes palpated Neurologic: Grossly normal  Pelvic: External genitalia:  no lesions              Urethra:  normal appearing urethra with no masses, tenderness or lesions              Bartholins and Skenes: normal                 Vagina: normal appearing vagina with normal color and discharge, no lesions              Cervix: no lesions              Pap taken: yes Bimanual Exam:  Uterus:  normal size, contour, position, consistency, mobility, non-tender              Adnexa: normal adnexa and no mass, fullness, tenderness               Rectovaginal: Confirms               Anus:  normal sphincter tone, no lesions  A:  Well Woman with normal exam  PMP, no HRT  H/O breast cancer 9/05 s/p lumpectomy, sentinel node biopsy, s/p chemotherapy and radiation, s/p tamoxifen. Now back on Tamoxifen to complete 10 years of therapy.  H/o metabolic syndrome improved with wt loss several years ago  Labile BP, on Beta blocker, being followed by cardiology  Chronic insomnia.  Off Ambien. Chemotherapy induced neuropathy  Vaginal atrophy  Thickened, white vulvar skin changes. Bx showed benign mucosa with hyperplasia and hyperkeratosis  Osteopenia.  Last BMD 8/14. Try to repeat this year.   Elevated LFT.  Dr. Claiborne Billings is worried this is niacin related.  P:   Mammogram yearly with MRI every other year. MRI done 9/14.  Pap smear today.  ASCUS with neg HR HPV 2013 Sees Dr. Forde Dandy every six months  Clonazepam 1mg  qhs daily.  #90/1RF Vagifem 71meq pv twice weekly.  #24/4RF. No rx needed for topical testosterone prioprionate 1% in petrolatum. Small amt two to three times  weekly.  return annually or prn   An After Visit Summary was printed and given to the patient.

## 2014-03-30 NOTE — Patient Instructions (Signed)

## 2014-04-03 LAB — IPS PAP SMEAR ONLY

## 2014-04-16 ENCOUNTER — Telehealth: Payer: Self-pay | Admitting: Obstetrics & Gynecology

## 2014-04-16 ENCOUNTER — Telehealth: Payer: Self-pay

## 2014-04-16 NOTE — Telephone Encounter (Signed)
Message copied by Robley Fries on Mon Apr 16, 2014 10:03 AM ------      Message from: Megan Salon      Created: Fri Apr 06, 2014 11:30 PM       Can you call dr. Tamala Julian.  I haven't gotten her lab results yet.  I put in the order for her to go to [facility] to have them done.  What is her plan?  I need to reenter them if she is going to go and have them drawn.  Thanks.            MSM       ----- Message -----         From: SYSTEM         Sent: 04/04/2014  12:01 AM           To: Lyman Speller, MD                   ------

## 2014-04-16 NOTE — Telephone Encounter (Signed)
Patient notified of all information-see phone note//kn

## 2014-04-16 NOTE — Telephone Encounter (Signed)
Returning call.

## 2014-04-16 NOTE — Telephone Encounter (Signed)
Returning a call to [facility] [employer]

## 2014-04-16 NOTE — Telephone Encounter (Signed)
Lmtcb//kn 

## 2014-05-17 ENCOUNTER — Encounter: Payer: Self-pay | Admitting: Cardiovascular Disease

## 2014-05-17 ENCOUNTER — Other Ambulatory Visit: Payer: Self-pay | Admitting: Cardiovascular Disease

## 2014-05-17 ENCOUNTER — Telehealth: Payer: Self-pay | Admitting: Cardiovascular Disease

## 2014-05-17 DIAGNOSIS — E785 Hyperlipidemia, unspecified: Secondary | ICD-10-CM

## 2014-05-17 NOTE — Telephone Encounter (Signed)
Lipid panel ordered and released to [facility].

## 2014-05-17 NOTE — Telephone Encounter (Signed)
The pt says she  Just had lab ,she need you to send an order down to [facility] for a Lipid Profile please.

## 2014-05-18 ENCOUNTER — Telehealth: Payer: Self-pay | Admitting: Cardiovascular Disease

## 2014-05-18 LAB — LIPID PANEL
Cholesterol: 143 mg/dL (ref 0–200)
HDL: 56 mg/dL (ref >39)
LDL Cholesterol: 61 mg/dL (ref 0–99)
Total CHOL/HDL Ratio: 2.6 Ratio
Triglycerides: 128 mg/dL (ref <150)
VLDL: 26 mg/dL (ref 0–40)

## 2014-05-18 LAB — HEPATIC FUNCTION PANEL
ALT: 69 U/L — ABNORMAL HIGH (ref 0–35)
AST: 61 U/L — ABNORMAL HIGH (ref 0–37)
Albumin: 4.5 g/dL (ref 3.5–5.2)
Alkaline Phosphatase: 40 U/L (ref 39–117)
Bilirubin, Direct: 0.1 mg/dL (ref 0.0–0.3)
Indirect Bilirubin: 0.5 mg/dL (ref 0.2–1.2)
Total Bilirubin: 0.6 mg/dL (ref 0.2–1.2)
Total Protein: 6.7 g/dL (ref 6.0–8.3)

## 2014-05-18 NOTE — Telephone Encounter (Signed)
Returned a call to patient. She was calling to see if her LFT results were ready. Discussed findings with patient. 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 Informed her that after Dr. Claiborne Billings reviews the results he may have additional recommendations for her, if so i will call to notify. Patient agreed with plan.

## 2014-05-18 NOTE — Telephone Encounter (Signed)
Pt said you asked her to call you.

## 2014-05-30 ENCOUNTER — Other Ambulatory Visit: Payer: Self-pay | Admitting: *Deleted

## 2014-05-30 DIAGNOSIS — Z79899 Other long term (current) drug therapy: Secondary | ICD-10-CM

## 2014-05-30 DIAGNOSIS — R899 Unspecified abnormal finding in specimens from other organs, systems and tissues: Secondary | ICD-10-CM

## 2014-06-19 ENCOUNTER — Telehealth: Payer: Self-pay | Admitting: Cardiovascular Disease

## 2014-06-19 ENCOUNTER — Other Ambulatory Visit: Payer: Self-pay

## 2014-06-19 DIAGNOSIS — Z1231 Encounter for screening mammogram for malignant neoplasm of breast: Secondary | ICD-10-CM

## 2014-06-25 NOTE — Telephone Encounter (Signed)
Closed encounter °

## 2014-07-06 ENCOUNTER — Ambulatory Visit: Payer: BC Managed Care – PPO

## 2014-07-09 ENCOUNTER — Ambulatory Visit (HOSPITAL_BASED_OUTPATIENT_CLINIC_OR_DEPARTMENT_OTHER): Payer: BC Managed Care – PPO | Admitting: Oncology

## 2014-07-09 ENCOUNTER — Other Ambulatory Visit (HOSPITAL_BASED_OUTPATIENT_CLINIC_OR_DEPARTMENT_OTHER): Payer: BC Managed Care – PPO

## 2014-07-09 ENCOUNTER — Telehealth: Payer: Self-pay | Admitting: Oncology

## 2014-07-09 VITALS — BP 120/85 | HR 74 | Temp 98.3°F | Resp 20 | Ht 63.5 in | Wt 157.0 lb

## 2014-07-09 DIAGNOSIS — Z23 Encounter for immunization: Secondary | ICD-10-CM

## 2014-07-09 DIAGNOSIS — C50412 Malignant neoplasm of upper-outer quadrant of left female breast: Secondary | ICD-10-CM

## 2014-07-09 DIAGNOSIS — Z853 Personal history of malignant neoplasm of breast: Secondary | ICD-10-CM

## 2014-07-09 DIAGNOSIS — G620 Drug-induced polyneuropathy: Secondary | ICD-10-CM

## 2014-07-09 LAB — CBC WITH DIFFERENTIAL/PLATELET
BASO%: 0.3 % (ref 0.0–2.0)
Basophils Absolute: 0 10*3/uL (ref 0.0–0.1)
EOS%: 0.9 % (ref 0.0–7.0)
Eosinophils Absolute: 0.1 10*3/uL (ref 0.0–0.5)
HCT: 38.5 % (ref 34.8–46.6)
HGB: 13.3 g/dL (ref 11.6–15.9)
LYMPH%: 27.2 % (ref 14.0–49.7)
MCH: 31.6 pg (ref 25.1–34.0)
MCHC: 34.5 g/dL (ref 31.5–36.0)
MCV: 91.4 fL (ref 79.5–101.0)
MONO#: 0.4 10*3/uL (ref 0.1–0.9)
MONO%: 6.3 % (ref 0.0–14.0)
NEUT#: 4.2 10*3/uL (ref 1.5–6.5)
NEUT%: 65.3 % (ref 38.4–76.8)
Platelets: 176 10*3/uL (ref 145–400)
RBC: 4.21 10*6/uL (ref 3.70–5.45)
RDW: 12.4 % (ref 11.2–14.5)
WBC: 6.5 10*3/uL (ref 3.9–10.3)
lymph#: 1.8 10*3/uL (ref 0.9–3.3)

## 2014-07-09 LAB — COMPREHENSIVE METABOLIC PANEL (CC13)
ALT: 46 U/L (ref 0–55)
AST: 34 U/L (ref 5–34)
Albumin: 3.9 g/dL (ref 3.5–5.0)
Alkaline Phosphatase: 57 U/L (ref 40–150)
Anion Gap: 12 mEq/L — ABNORMAL HIGH (ref 3–11)
BUN: 16.6 mg/dL (ref 7.0–26.0)
CO2: 24 mEq/L (ref 22–29)
Calcium: 9.5 mg/dL (ref 8.4–10.4)
Chloride: 107 mEq/L (ref 98–109)
Creatinine: 0.9 mg/dL (ref 0.6–1.1)
Glucose: 97 mg/dl (ref 70–140)
Potassium: 4.1 mEq/L (ref 3.5–5.1)
Sodium: 142 mEq/L (ref 136–145)
Total Bilirubin: 0.58 mg/dL (ref 0.20–1.20)
Total Protein: 7 g/dL (ref 6.4–8.3)

## 2014-07-09 MED ORDER — INFLUENZA VAC SPLIT QUAD 0.5 ML IM SUSY
0.5000 mL | PREFILLED_SYRINGE | Freq: Once | INTRAMUSCULAR | Status: AC
  Administered 2014-07-09: 0.5 mL via INTRAMUSCULAR
  Filled 2014-07-09: qty 0.5

## 2014-07-09 NOTE — Addendum Note (Signed)
Addended by: Renford Dills on: 07/09/2014 04:17 PM   Modules accepted: Orders

## 2014-07-09 NOTE — Progress Notes (Signed)
ID: Jolyn Nap   DOB: 12/24/1955  MR#: [id number]  NKN#:[id number]  PCP: Reynold Bowen GYN: Edwinna Areola SU: Osborn Coho OTHER MD: Netty Starring, Floyde Parkins, Pryor Montes, Shelva Majestic  HISTORY OF PRESENT ILLNESS: Emilene palpated a mass in her left breast while showering 06/2000.  She proceeded to bilateral mammograms at the [facility] the same day, and this showed, as compared to prior, a new asymmetric density in the superior aspect of the left breast, which was soft but palpable.  Ultrasound showed a vague area of persistent shadowing measuring approximately 1.3 cm.   Biopsy was performed the same day and showed ([id number]) an infiltrating ductal carcinoma with focal lobular features.  There was no obvious lymphovascular invasion.  The greatest extent of tumor in the biopsy was 1.3 cm.    The patient saw Dr. Ronnie Derby on 09/02 and had bilateral breast MRIs the same day.  The right breast was negative.  In the left breast there was an oval mass measuring up to 1.8 cm with no lower axillary adenopathy noted. Accordingly, the patient proceeded to left lumpectomy and axillary lymph node dissection as well as port placement 07/01/04.  Results of this procedure and her subsequent history are as detailed below.   INTERVAL HISTORY: Courtany returns today for followup of her breast cancer. The interval history is generally unremarkable and she continues to take tamoxifen with some nuisance but no major side effects. She does have some vaginal stickiness and wetness which she despises and she does have hot flashes which are more occasional but in bothersome  REVIEW OF SYSTEMS: Unfortunately her peripheral neuropathy persists. At this point it is unlikely to improve one way or the other. Vaginal dryness is well controlled with Vagifem suppositories. She's exercising about 6 times a week. She is very concerned about bone density issues but has not decided how to go about it. She is getting different advice in  different directions of advice regarding that. Otherwise a detailed review of systems today was stable.  PAST MEDICAL HISTORY: Past Medical History  Diagnosis Date  . Peripheral neuropathy   . Hypertriglyceridemia   . History of breast cancer 2005    invasive ductal cancer left breast  . Blood transfusion   . GERD (gastroesophageal reflux disease)   . Ulcer   . Tongue dysplasia   . Irritable bowel syndrome   . Migraines   . Hypertension   . Avascular necrosis of bone     Left talus  . Osteopenia   . Dysplastic nevus 1988    right hip  . Plantar fasciitis 6/04  . Metabolic syndrome     resolved with diet and exercise  . Palpitations 12/13    normal echo    PAST SURGICAL HISTORY: Past Surgical History  Procedure Laterality Date  . Wrist surgery  1988    right, DeQuervains  . Cesarean section  1991, 1993    x2  . Laparoscopic cholecystectomy  2003  . Breast lumpectomy  06/2004    left, with port placement  . Ankle surgery  07/2007    left, revascularization  . Shoulder arthroscopy  10/2005    right  . Tongue surgery  2012, 2013  . Colonoscopy  09/2013    Dr. Collene Mares    FAMILY HISTORY Family History  Problem Relation Age of Onset  . Asthma Brother   . Asthma Sister   . Cancer Mother     melanoma  . Cancer Father  colon  Both parents are alive. Her father had a colon polyp removed remotely.  Her mother has some hypercholesterolemia.  There is no history of breast or ovarian cancer in the family.  A 80 year old sister has some asthma.  A 87 year old brother has some weight problems.  GYNECOLOGIC HISTORY: GX P2. 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 Her husband Elta Guadeloupe is a Chief Executive Officer, Company secretary, and businessman. Daughter [name] is currently 39 years old and is a Probation officer. Son [name] is attending [employer], in Engineer, production and contemplating a move to  [employer]  ADVANCED DIRECTIVES: in place  HEALTH MAINTENANCE: History  Substance Use Topics  . Smoking status: Never Smoker   . Smokeless tobacco: Never Used  . Alcohol Use: 1.2 oz/week    2 Glasses of wine per week     Comment: wine daily     Colonoscopy: [facility]  PAP:  Bone density:  Lipid panel:  Allergies  Allergen Reactions  . Augmentin [Amoxicillin-Pot Clavulanate]     abd cramping  . Crestor [Rosuvastatin]     Liver function elevated     Current Outpatient Prescriptions  Medication Sig Dispense Refill  . aspirin 81 MG tablet Take 81 mg by mouth daily. 2 tablets daily      . BYSTOLIC 10 MG tablet TAKE 1 BY MOUTH DAILY  90 tablet  1  . clonazePAM (KLONOPIN) 1 MG tablet Take 1 tablet (1 mg total) by mouth at bedtime.  90 tablet  1  . Estradiol (VAGIFEM) 10 MCG TABS vaginal tablet 1 tablet vaginally twice weekly  24 tablet  4  . Multiple Vitamins-Minerals (MULTIVITAMIN PO) Take 1 tablet by mouth daily. With calcium      . NONFORMULARY OR COMPOUNDED ITEM Place 1.25 mLs onto the skin 3 (three) times a week. weekly      . Omega-3-acid Ethyl Esters (LOVAZA PO) Take 2 g by mouth 2 (two) times daily.       Marland Kitchen omeprazole (PRILOSEC) 10 MG capsule Take 10 mg by mouth as needed.      . ranitidine (ZANTAC) 150 MG tablet Take 150 mg by mouth as needed for heartburn.      . tamoxifen (NOLVADEX) 20 MG tablet Take 1 tablet (20 mg total) by mouth daily.  90 tablet  4  . ZETIA 10 MG tablet TAKE 1 BY MOUTH DAILY  90 tablet  1   No current facility-administered medications for this visit.    OBJECTIVE: Middle-aged white woman who looks stated age 3 Vitals:   07/09/14 1434  BP: 120/85  Pulse: 74  Temp: 98.3 F (36.8 C)  Resp: 20     Body mass index is 27.37 kg/(m^2).    ECOG FS: 1  Sclerae unicteric, pupils are round and equal Oropharynx clear, dentition in good repair No cervical or supraclavicular adenopathy Lungs no rales or rhonchi Heart regular rate and rhythm Abd  soft, nontender, positive bowel sounds MSK no focal spinal tenderness, no upper extremity lymphedema Neuro: nonfocal, well-oriented, positive affect Breasts: The right breast is unremarkable. The left breast is status post lumpectomy and radiation. There is no evidence of local recurrence. The left axilla is benign.  LAB RESULTS: Lab Results  Component Value Date   WBC 6.5 07/09/2014   NEUTROABS 4.2 07/09/2014   HGB 13.3 07/09/2014   HCT 38.5 07/09/2014   MCV 91.4 07/09/2014   PLT 176  07/09/2014      Chemistry      Component Value Date/Time   NA 143 03/16/2014 0814   NA 138 07/07/2012 1505   K 4.3 03/16/2014 0814   K 3.8 07/07/2012 1505   CL 107 03/16/2014 0814   CL 105 07/07/2012 1505   CO2 26 03/16/2014 0814   CO2 21* 07/07/2012 1505   BUN 13 03/16/2014 0814   BUN 20.0 07/07/2012 1505   CREATININE 0.76 03/16/2014 0814   CREATININE 0.8 07/07/2012 1505   CREATININE 0.56 07/17/2011 1455      Component Value Date/Time   CALCIUM 9.2 03/16/2014 0814   CALCIUM 9.4 07/07/2012 1505   ALKPHOS 40 05/17/2014 1548   ALKPHOS 99 07/07/2012 1505   AST 61* 05/17/2014 1548   AST 35* 07/07/2012 1505   ALT 69* 05/17/2014 1548   ALT 53 07/07/2012 1505   BILITOT 0.6 05/17/2014 1548   BILITOT 1.00 07/07/2012 1505       Lab Results  Component Value Date   LABCA2 15 07/17/2011    No components found with this basename: LABCA125    No results found for this basename: INR,  in the last 168 hours  Urinalysis No results found for this basename: colorurine,  appearanceur,  labspec,  phurine,  glucoseu,  hgbur,  bilirubinur,  ketonesur,  proteinur,  urobilinogen,  nitrite,  leukocytesur    STUDIES:  Clinical Data: Osteoporosis screening. History of Arimidex switch  to tamoxifen September 2013 and still on Tamoxifen currently. Both  hips evaluated per physician request.  DUAL X-RAY ABSORPTIOMETRY (DXA) FOR BONE MINERAL DENSITY  AP LUMBAR SPINE L1, L3 AND L4  Bone Mineral Density (BMD): 0.806 g/cm2  Young  Adult T Score: -2.2  Z Score: -1.2  LEFT FEMUR NECK:  Bone Mineral Density (BMD): 0.642 g/cm2  Young Adult T Score: -1.9  Z Score: -0.8  RIGHT FEMORAL NECK:  Bone Mineral Density (BMD): 0.587 g/cm2  Young Adult T Score: -2.4  Z Score: -1.3  ASSESSMENT: Patient's diagnostic category is LOW BONE MASS by WHO  Criteria.  FRACTURE RISK: INCREASED  FRAX: Based on the [place] model, the 10  year probability of a major osteoporotic fracture is 14%. The 10  year probability of a hip fracture is 2.6%.  Comparison: 6.4% decrease in bone density over the lumbar spine  compared to 06/01/2011. 9.2% decrease in bone density over the  total left hip compared to 2012. 10.3% decrease in bone density  over the total right hip compared to 2012.   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 Shelby Dubin  (3) on tamoxifen between May 2006 and May 2011, at which point she switched to anastrozole, switching back to tamoxifen September 2013.   PLAN: Halpin is  doing fine as far as her breast cancer is concerned, and she is now 10 years out from her original surgery. Of course her mammogram this year still pending. Nevertheless her exam is benign. She will complete 10 years of tamoxifen in May 2016 unlikely she will "graduate" from followup here at that time.  She is very concerned regarding bone density issues. We have discussed zolendronate and she understands in  addition to helping with osteopenia there is data from 2 randomized studies that in postmenopausal women zolendronate reduces the risk of breast cancer  recurrence. The benefit is in the 3% range. For that reason I tend to favor zolendronate over denosumab in this population.  I suggested she participate in our "intimacy and pelvic health" program and gave her the appropriate information. Otherwise I am delighted that she is doing so well. She knows to call for any problems that may develop before her next visit here, a year from now.    Jamie-Lee Galdamez C    07/09/2014

## 2014-07-10 ENCOUNTER — Encounter: Payer: Self-pay | Admitting: Oncology

## 2014-07-12 ENCOUNTER — Ambulatory Visit
Admission: RE | Admit: 2014-07-12 | Discharge: 2014-07-12 | Disposition: A | Discharge status: Home / Self Care | Payer: BC Managed Care – PPO | Source: Ambulatory Visit

## 2014-07-12 ENCOUNTER — Other Ambulatory Visit: Payer: Self-pay

## 2014-07-12 DIAGNOSIS — Z1231 Encounter for screening mammogram for malignant neoplasm of breast: Secondary | ICD-10-CM

## 2014-07-17 ENCOUNTER — Encounter: Payer: Self-pay | Admitting: Oncology

## 2014-07-17 ENCOUNTER — Other Ambulatory Visit: Payer: Self-pay | Admitting: *Deleted

## 2014-07-17 DIAGNOSIS — Z1231 Encounter for screening mammogram for malignant neoplasm of breast: Secondary | ICD-10-CM

## 2014-07-17 DIAGNOSIS — Z853 Personal history of malignant neoplasm of breast: Secondary | ICD-10-CM

## 2014-07-17 MED ORDER — TAMOXIFEN CITRATE 20 MG PO TABS: 20.0000 mg | ORAL_TABLET | Freq: Every day | ORAL | Status: DC

## 2014-07-19 ENCOUNTER — Ambulatory Visit: Payer: BC Managed Care – PPO | Admitting: Cardiovascular Disease

## 2014-07-20 ENCOUNTER — Telehealth: Payer: Self-pay | Admitting: Obstetrics & Gynecology

## 2014-07-20 NOTE — Telephone Encounter (Signed)
Left message for patient to call back. Need to advise the BMD is covered annually under her health plan.

## 2014-07-20 NOTE — Telephone Encounter (Signed)
Patient returning call.

## 2014-07-23 NOTE — Telephone Encounter (Signed)
Advised patient that BMD is covered annually and that per MSM: She can call and schedule and I will have Claiborne Billings fax over and order. Thanks.   Patient agreeable.

## 2014-07-25 ENCOUNTER — Telehealth: Payer: Self-pay | Admitting: Obstetrics & Gynecology

## 2014-07-25 DIAGNOSIS — M8580 Other specified disorders of bone density and structure, unspecified site: Secondary | ICD-10-CM

## 2014-07-25 NOTE — Telephone Encounter (Signed)
Order placed for BMD to the [facility]. Dr.Miller recommended to repeat this year due to osteopenia. Spoke with patient. Advised of order sent to the [facility]. Patient is agreeable and will call to schedule.  Routing to provider for final review. Patient agreeable to disposition. Will close encounter

## 2014-07-25 NOTE — Telephone Encounter (Signed)
Pt needs order sent to the [facility] center on [address] for a bone density.

## 2014-08-03 ENCOUNTER — Ambulatory Visit (INDEPENDENT_AMBULATORY_CARE_PROVIDER_SITE_OTHER): Payer: BC Managed Care – PPO | Admitting: Cardiovascular Disease

## 2014-08-03 ENCOUNTER — Encounter: Payer: Self-pay | Admitting: Cardiovascular Disease

## 2014-08-03 VITALS — BP 140/90 | HR 63 | Ht 64.0 in | Wt 157.0 lb

## 2014-08-03 DIAGNOSIS — M8580 Other specified disorders of bone density and structure, unspecified site: Secondary | ICD-10-CM

## 2014-08-03 DIAGNOSIS — E785 Hyperlipidemia, unspecified: Secondary | ICD-10-CM

## 2014-08-03 DIAGNOSIS — M810 Age-related osteoporosis without current pathological fracture: Secondary | ICD-10-CM

## 2014-08-03 DIAGNOSIS — R002 Palpitations: Secondary | ICD-10-CM

## 2014-08-03 NOTE — Patient Instructions (Signed)
Your physician wants you to follow-up in: 1 Year. You will receive a reminder letter in the mail two months in advance. If you don't receive a letter, please call our office to schedule the follow-up appointment.  

## 2014-08-03 NOTE — Progress Notes (Signed)
Patient ID: Jessica Mack, female   DOB: 03-28-1956, 58 y.o.   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  Dr. Tamala Julian has a history of invasive carcinoma of her breast which was estrogen positive/progesterone positive. She had been treated in the past with Adriamycin, Cytoxan and additional therapy. I had seen her Initially after she experienced episodes of labile blood pressure as well as palpitations.  The palpitations have significantly improved with the addition of Bystolic 10 mg daily. She also has a history of hyperlipidemia. In the past, she develop LFT elevation. Presently, she denies any further palpitations. She had been on a regimen with reference to her hyperlipidemia including Zetia 10 mg Niaspan 750 mg and Lovaza 2 capsules daily. A followup NMR profile last year was  markedly improved from her previous one with almost 50% reduction in values. Her LDL particle numbers now 859 down from 1537, small LDL particle #538, down from 947. Her total cholesterol is improved from 200 to 141.  She has been taken off her Niaspan in light of her LFT elevation.  She has been maintained presently on Zetia 10 mg and Lovaza in 2 months ago.  Her cholesterol was 143, triglycerides 128, HDL 56, LDL cholesterol 61, and VLDL cholesterol 26.  3 weeks ago, LFTs are now normal with an AST of 34 and an ALT of 46.  She has normal renal function with a BUN of 16, and creatinine 0.9.  She does have osteopenia.  She denies chest pain.  She is exercising approximately 6 days per week.  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  Past Medical History  Diagnosis Date  . Peripheral neuropathy   . Hypertriglyceridemia   . History of breast cancer 2005    invasive ductal cancer left breast  . Blood transfusion   . GERD (gastroesophageal reflux disease)   . Ulcer   . Tongue dysplasia     . Irritable bowel syndrome   . Migraines   . Hypertension   . Avascular necrosis of bone     Left talus  . Osteopenia   . Dysplastic nevus 1988    right hip  . Plantar fasciitis 6/04  . Metabolic syndrome     resolved with diet and exercise  . Palpitations 12/13    normal echo    Past Surgical History  Procedure Laterality Date  . Wrist surgery  1988    right, DeQuervains  . Cesarean section  1991, 1993    x2  . Laparoscopic cholecystectomy  2003  . Breast lumpectomy  06/2004    left, with port placement  . Ankle surgery  07/2007    left, revascularization  . Shoulder arthroscopy  10/2005    right  . Tongue surgery  2012, 2013  . Colonoscopy  09/2013    Dr. Collene Mares    Allergies  Allergen Reactions  . Augmentin [Amoxicillin-Pot Clavulanate]     abd cramping  . Crestor [Rosuvastatin]     Liver function elevated     Current Outpatient Prescriptions  Medication Sig Dispense Refill  . BYSTOLIC 10 MG tablet TAKE 1 BY MOUTH DAILY  90 tablet  1  . clonazePAM (KLONOPIN) 1 MG tablet Take 1/2 tablet by mouth at bedtime      . Estradiol 10 MCG TABS vaginal tablet 1 tablet vaginally once weekly      .  Multiple Vitamins-Minerals (MULTIVITAMIN PO) Take 1 tablet by mouth daily. With calcium      . NONFORMULARY OR COMPOUNDED ITEM Place 1.25 mLs onto the skin 3 (three) times a week. weekly      . Omega-3-acid Ethyl Esters (LOVAZA PO) Take 2 g by mouth daily.       . tamoxifen (NOLVADEX) 20 MG tablet Take 1 tablet (20 mg total) by mouth daily.  90 tablet  2  . ZETIA 10 MG tablet TAKE 1 BY MOUTH DAILY  90 tablet  1   No current facility-administered medications for this visit.    History   Social History  . Marital Status: Married    Spouse Name: N/A    Number of Children: 2  . Years of Education: MD   Occupational History  .  [place] History Main Topics  . Smoking status: Never Smoker   . Smokeless tobacco: Never Used  . Alcohol Use: 1.2 oz/week    2  Glasses of wine per week     Comment: wine daily  . Drug Use: No  . Sexual Activity: Yes    Partners: Male    Birth Control/ Protection: Post-menopausal   Other Topics Concern  . Not on file   Social History Narrative   Retired OB/GYN physician    Family History  Problem Relation Age of Onset  . Asthma Brother   . Asthma Sister   . Cancer Mother     melanoma  . Cancer Father     colon    ROS  General: Negative; No fevers, chills, or night sweats;  HEENT: Negative; No changes in vision or hearing, sinus congestion, difficulty swallowing Pulmonary: Negative; No cough, wheezing, shortness of breath, hemoptysis Cardiovascular: Negative; No chest pain, presyncope, syncope, palpitations GI: Negative; No nausea, vomiting, diarrhea, or abdominal pain GU: Negative; No dysuria, hematuria, or difficulty voiding Musculoskeletal: Positive for osteopenia, predominantly in the hips and spine; no myalgias, joint pain, or weakness Hematologic/Oncology: Negative; no easy bruising, bleeding Endocrine: Negative; no heat/cold intolerance; no diabetes Neuro: Negative; no changes in balance, headaches Skin: Negative; No rashes or skin lesions Psychiatric: Negative; No behavioral problems, depression Sleep: Negative; No snoring, daytime sleepiness, hypersomnolence, bruxism, restless legs, hypnogognic hallucinations, no cataplexy Other comprehensive 14 point system review is negative.   PE BP 140/90  Pulse 63  Ht 5' 4"  (1.626 m)  Wt 157 lb (71.215 kg)  BMI 26.94 kg/m2  LMP 06/26/2004  General: Alert, oriented, no distress.  Skin: normal turgor, no rashes HEENT: Normocephalic, atraumatic. Pupils round and reactive; sclera anicteric;no lid lag.  Nose without nasal septal hypertrophy Mouth/Parynx benign; Mallinpatti scale 2 Neck: No JVD, no carotid bruits with normal carotid upstroke Lungs: clear to ausculatation and percussion; no wheezing or rales Chest wall: Nontender to  palpation Heart: RRR, s1 s2 normal whiff of a systolic murmur,unchanged Abdomen: soft, nontender; no hepatosplenomehaly, BS+; abdominal aorta nontender and not dilated by palpation.  back: No CVA tenderness Pulses 2+ Extremities: no clubbing cyanosis or edema, Homan's sign negative  Neurologic: grossly nonfocal Psychological: Normal affect and mood  ECG was not done today per request of Dr. Tamala Julian.  Prior September 2014 ECG: Sinus rhythm at 67 beats per minute. QTc interval 414 ms.  LABS:  BMET    Component Value Date/Time   NA 142 07/09/2014 1420   NA 143 03/16/2014 0814   K 4.1 07/09/2014 1420   K 4.3 03/16/2014 0814   CL 107 03/16/2014 4481  CL 105 07/07/2012 1505   CO2 24 07/09/2014 1420   CO2 26 03/16/2014 0814   GLUCOSE 97 07/09/2014 1420   GLUCOSE 97 03/16/2014 0814   GLUCOSE 106* 07/07/2012 1505   BUN 16.6 07/09/2014 1420   BUN 13 03/16/2014 0814   CREATININE 0.9 07/09/2014 1420   CREATININE 0.76 03/16/2014 0814   CREATININE 0.56 07/17/2011 1455   CALCIUM 9.5 07/09/2014 1420   CALCIUM 9.2 03/16/2014 0814     Hepatic Function Panel     Component Value Date/Time   PROT 7.0 07/09/2014 1420   PROT 6.7 05/17/2014 1548   ALBUMIN 3.9 07/09/2014 1420   ALBUMIN 4.5 05/17/2014 1548   AST 34 07/09/2014 1420   AST 61* 05/17/2014 1548   ALT 46 07/09/2014 1420   ALT 69* 05/17/2014 1548   ALKPHOS 57 07/09/2014 1420   ALKPHOS 40 05/17/2014 1548   BILITOT 0.58 07/09/2014 1420   BILITOT 0.6 05/17/2014 1548   BILIDIR 0.1 05/17/2014 1548   IBILI 0.5 05/17/2014 1548     CBC    Component Value Date/Time   WBC 6.5 07/09/2014 1420   WBC 3.9* 03/16/2014 0814   RBC 4.21 07/09/2014 1420   RBC 4.31 03/16/2014 0814   HGB 13.3 07/09/2014 1420   HGB 13.9 03/16/2014 0814   HCT 38.5 07/09/2014 1420   HCT 40.7 03/16/2014 0814   PLT 176 07/09/2014 1420   PLT 215 03/16/2014 0814   MCV 91.4 07/09/2014 1420   MCV 94.4 03/16/2014 0814   MCH 31.6 07/09/2014 1420   MCH 32.3 03/16/2014 0814   MCHC 34.5 07/09/2014 1420    MCHC 34.2 03/16/2014 0814   RDW 12.4 07/09/2014 1420   RDW 13.0 03/16/2014 0814   LYMPHSABS 1.8 07/09/2014 1420   MONOABS 0.4 07/09/2014 1420   EOSABS 0.1 07/09/2014 1420   BASOSABS 0.0 07/09/2014 1420     BNP No results found for this basename: probnp    Lipid Panel     Component Value Date/Time   CHOL 143 05/17/2014 1548   TRIG 128 05/17/2014 1548   TRIG 144 07/13/2013 0823   HDL 56 05/17/2014 1548   CHOLHDL 2.6 05/17/2014 1548   VLDL 26 05/17/2014 1548   LDLCALC 61 05/17/2014 1548   LDLCALC 53 07/13/2013 0823     RADIOLOGY: Mr Breast Bilateral W Wo Contrast  07/17/2013   *RADIOLOGY REPORT*  Clinical Data:58 year old female with history of left breast cancer, left lumpectomy, radiation chemotherapy in 2005.  Follow- up.  BILATERAL BREAST MRI WITH AND WITHOUT CONTRAST  Technique: Multiplanar, multisequence MR images of both breasts were obtained prior to and following the intravenous administration of 69m of Multihance.  THREE-DIMENSIONAL MR IMAGE RENDERING ON INDEPENDENT WORKSTATION: Three-dimensional MR images were rendered by post-processing  of the original MR data on an independent DynaCad workstation. The three-dimensional MR images were interpreted, and findings are reported in the following complete MRI report for this study.  Comparison:  07/14/2013 and prior mammograms.  07/13/2011 MRI  FINDINGS:  Breast composition:  b: scattered areas of fibroglandular density  Background parenchymal enhancement: Minimal  Right breast:  No mass or abnormal enhancement.  Left breast:  No mass or abnormal enhancement. Breast scarring is again identified.  Lymph nodes:  No abnormal appearing lymph nodes.  Ancillary findings:  Cardiomegaly and a hepatic cyst again noted.  IMPRESSION: No MR evidence of breast malignancy.  Left breast scarring.  BI-RADS CATEGORY 2:  Benign finding(s).  RECOMMENDATION: Bilateral screening mammograms in 1 year.  Original Report Authenticated By: Margarette Canada, M.D.   [facility]  Diagnostic Bilat  07/14/2013   *RADIOLOGY REPORT*  Clinical Data:  Malignant lumpectomy of the left breast in September, 2005 with subsequent radiation therapy and presurgical chemotherapy.  The patient is currently on Arimidex therapy. Annual evaluation.  DIGITAL DIAGNOSTIC BILATERAL MAMMOGRAM WITH CAD  Comparison: 07/12/2012, 07/09/2011, dating back to 12/28/2005.  Findings:  ACR Breast Density Category b:  There are scattered areas of fibroglandular density.  CC and MLO views of both breasts were obtained.  Post lumpectomy scarring and associated dystrophic calcifications of fat necrosis in the upper left breast.  There are no findings suspicious for malignancy in either breast.  Mammographic images were processed with CAD.  IMPRESSION: No specific mammographic evidence of malignancy.  Expected post lumpectomy changes in the upper left breast.  RECOMMENDATION: As the patient is greater than 7 years out from her lumpectomy, she may return to annual screening mammography unless there are continued clinical concerns.  I have discussed the findings and recommendations with the patient. Results were also provided in writing at the conclusion of the visit.  If applicable, a reminder letter will be sent to the patient regarding her next appointment.  BI-RADS CATEGORY 2:  Benign finding(s).   Original Report Authenticated By: Evangeline Dakin, M.D.      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  Her blood pressure has now stabilized and typically at home is in the 120/80 range and when taken by me today was 130/86.  She is now on Zetia in addition to Lovaza.  A recent lipid panel continues to be stable.  Her LFTs had normalized, improved with discontinuance of Niaspan.  She does have osteopenia and is under evaluation by her primary physician and Dr. Jana Hakim.  Her breast cancer is stable now 10 years following initial diagnosis.  She  continues on tamoxifen.  She will be seeing Dr. Forde Dandy in approximally 6 months.  I will see her in one year for reevaluation.   Troy Sine, MD, [facility]  08/03/2014 4:44 PM

## 2014-08-05 ENCOUNTER — Encounter: Payer: Self-pay | Admitting: Cardiovascular Disease

## 2014-08-05 DIAGNOSIS — M8580 Other specified disorders of bone density and structure, unspecified site: Secondary | ICD-10-CM | POA: Insufficient documentation

## 2014-08-06 ENCOUNTER — Ambulatory Visit
Admission: RE | Admit: 2014-08-06 | Discharge: 2014-08-06 | Disposition: A | Discharge status: Home / Self Care | Payer: BC Managed Care – PPO | Source: Ambulatory Visit | Attending: Obstetrics & Gynecology | Admitting: Obstetrics & Gynecology

## 2014-08-06 DIAGNOSIS — M8580 Other specified disorders of bone density and structure, unspecified site: Secondary | ICD-10-CM

## 2014-08-10 ENCOUNTER — Telehealth: Payer: Self-pay | Admitting: *Deleted

## 2014-08-10 ENCOUNTER — Other Ambulatory Visit: Payer: Self-pay | Admitting: Cardiovascular Disease

## 2014-08-10 DIAGNOSIS — Z0289 Encounter for other administrative examinations: Secondary | ICD-10-CM

## 2014-08-10 NOTE — Telephone Encounter (Signed)
Form,[employer] to [facility] 08-10-14.

## 2014-08-10 NOTE — Telephone Encounter (Signed)
Rx was sent to pharmacy electronically. 

## 2014-08-21 ENCOUNTER — Encounter: Payer: Self-pay | Admitting: Oncology

## 2014-08-22 ENCOUNTER — Other Ambulatory Visit: Payer: Self-pay | Admitting: Oncology

## 2014-08-22 NOTE — Telephone Encounter (Signed)
Form,[employer] received from Nurse Butch Penny completed, mailed to patient 08-22-14.

## 2014-08-26 HISTORY — PX: MOUTH SURGERY: SHX715

## 2014-08-27 ENCOUNTER — Encounter: Payer: Self-pay | Admitting: Cardiovascular Disease

## 2014-09-17 ENCOUNTER — Other Ambulatory Visit (HOSPITAL_COMMUNITY): Payer: Self-pay | Admitting: Orthopaedic Surgery

## 2014-09-17 DIAGNOSIS — M25512 Pain in left shoulder: Secondary | ICD-10-CM

## 2014-09-18 ENCOUNTER — Other Ambulatory Visit: Payer: Self-pay | Admitting: Dermatology

## 2014-09-19 ENCOUNTER — Ambulatory Visit (HOSPITAL_COMMUNITY)
Admission: RE | Admit: 2014-09-19 | Discharge: 2014-09-19 | Disposition: A | Discharge status: Home / Self Care | Payer: BC Managed Care – PPO | Source: Ambulatory Visit | Attending: Orthopaedic Surgery | Admitting: Orthopaedic Surgery

## 2014-09-19 DIAGNOSIS — M25512 Pain in left shoulder: Secondary | ICD-10-CM | POA: Diagnosis present

## 2014-09-19 DIAGNOSIS — Z853 Personal history of malignant neoplasm of breast: Secondary | ICD-10-CM | POA: Insufficient documentation

## 2014-09-19 DIAGNOSIS — M24112 Other articular cartilage disorders, left shoulder: Secondary | ICD-10-CM | POA: Diagnosis not present

## 2014-09-19 DIAGNOSIS — M12812 Other specific arthropathies, not elsewhere classified, left shoulder: Secondary | ICD-10-CM | POA: Diagnosis not present

## 2014-09-25 ENCOUNTER — Other Ambulatory Visit: Payer: Self-pay | Admitting: Obstetrics & Gynecology

## 2014-09-25 HISTORY — PX: SHOULDER SURGERY: SHX246

## 2014-09-26 ENCOUNTER — Telehealth: Payer: Self-pay | Admitting: Obstetrics & Gynecology

## 2014-09-26 NOTE — Telephone Encounter (Signed)
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 Patient said she saw Dr.Lathrop for this problem last year. Dr.Miller told her if this happens again she needs to see her. (Dr.Miller) for this issue.

## 2014-09-26 NOTE — Telephone Encounter (Signed)
Spoke with patient. Patient states "I think I may have a yeast infection but I also have a history of lichen sclerosus that she has biopsied. I came in last year and saw Dr. Charlies Constable but Dr.Miller told me to come in to see her if this happened again." Was taking Keflex 2 weeks ago after having a dental implant. "It feels like there is a hot poker up there. The skin is rough so I don't know if it related to that or what."  Symptoms began yesterday.Advised patient will check scheduling and return call to get patient scheduled. Patient is agreeable.

## 2014-09-26 NOTE — Telephone Encounter (Signed)
Spoke with patient. Appointment scheduled for tomorrow at 11:15am with Dr.Miller (time per Sally). Patient is agreeable to date and time.  Routing to provider for final review. Patient agreeable to disposition. Will close encounter   

## 2014-09-27 ENCOUNTER — Ambulatory Visit (INDEPENDENT_AMBULATORY_CARE_PROVIDER_SITE_OTHER): Payer: BC Managed Care – PPO | Admitting: Obstetrics & Gynecology

## 2014-09-27 VITALS — BP 136/78 | HR 82 | Resp 14 | Ht 63.0 in | Wt 158.0 lb

## 2014-09-27 DIAGNOSIS — B3731 Acute candidiasis of vulva and vagina: Secondary | ICD-10-CM

## 2014-09-27 DIAGNOSIS — B373 Candidiasis of vulva and vagina: Secondary | ICD-10-CM

## 2014-09-27 MED ORDER — CLONAZEPAM 1 MG PO TABS: 1.0000 mg | ORAL_TABLET | Freq: Every evening | ORAL | Status: DC | PRN

## 2014-09-27 MED ORDER — TERCONAZOLE 0.4 % VA CREA: 1.0000 | TOPICAL_CREAM | Freq: Every day | VAGINAL | Status: DC

## 2014-09-27 NOTE — Telephone Encounter (Signed)
rx refilled.  Will need to be signed and faxed.  Encounter closed.

## 2014-09-28 LAB — WET PREP BY MOLECULAR PROBE
Candida species: NEGATIVE
Gardnerella vaginalis: NEGATIVE
Trichomonas vaginosis: NEGATIVE

## 2014-10-05 ENCOUNTER — Encounter: Payer: Self-pay | Admitting: Obstetrics & Gynecology

## 2014-10-05 NOTE — Progress Notes (Signed)
Subjective:     Patient ID: Jessica Mack, female   DOB: 1956-05-17, 58 y.o.   MRN: [id number]  HPI 58 yo G2P2 MWF here for complaint of vaginal itching, irritation and almost stabbing sensation.  Wonders if yeast is present.  Intercourse is much more difficult as well due to these new issues.  This has been going on for four or five days.  Pt did take Keflex x 2 weeks for dental implant being placed.  Mild discharge.  No odor.  No new sexual partner.  H/O vulvar skin changes that have been biopsied in the past.  Pt unsure if this is having a "flare".  Recent breast skin biopsy done due to itching to R/O paget's disease.  Was negative.  Also, d/w pt BMD.  Was done about 1 year after first.  We are watching.  Will repeat 18 months if covered by insurance.  If not, will plan in 2 years.  Reminder placed.  Review of Systems  All other systems reviewed and are negative.      Objective:   Physical Exam  Constitutional: She is oriented to person, place, and time. She appears well-developed and well-nourished.  Genitourinary:    There is no rash, tenderness or lesion on the right labia. There is no rash, tenderness or lesion on the left labia. Cervix exhibits no motion tenderness, no discharge and no friability. Right adnexum displays no mass and no tenderness. Left adnexum displays no mass and no tenderness. There is tenderness in the vagina. No bleeding in the vagina. Vaginal discharge found.  Lymphadenopathy:       Right: No inguinal adenopathy present.       Left: No inguinal adenopathy present.  Neurological: She is alert and oriented to person, place, and time.  Skin: Skin is warm and dry.  Psychiatric: She has a normal mood and affect.   Wet smear:  Ph 4.5.  Saline with no trich, no clue cells.  KOH:  Mild yeast  Affirm obtained    Assessment:     Vulvar irritation/itching, pain     Plan:     Affirm pending Terazol 7 qhs x 7 days.  Pt knows to call if no improvement. Plan BMD  as above in about 18 months.

## 2014-11-16 ENCOUNTER — Other Ambulatory Visit: Payer: Self-pay | Admitting: Cardiovascular Disease

## 2014-11-18 NOTE — Telephone Encounter (Signed)
Rx(s) sent to pharmacy electronically.  

## 2014-12-10 ENCOUNTER — Telehealth: Payer: Self-pay | Admitting: Cardiovascular Disease

## 2014-12-10 MED ORDER — NEBIVOLOL HCL 10 MG PO TABS: 10.0000 mg | ORAL_TABLET | Freq: Every day | ORAL | Status: DC

## 2014-12-10 NOTE — Telephone Encounter (Signed)
Patient called  She states that her insurance company [employer]- will not fill her Bystolic. She only have a few pills left. RN informed patient samples available for pick up. RN informed patient this year medication is not on formulary- have to try 2-3 other alternatives first. Will have to defer to Dr Claiborne Billings - if he would like to change to another medication.

## 2014-12-16 NOTE — Telephone Encounter (Signed)
Bystolic has worked well without fatigue and control of her palpitation. See if we can give samples. If not then try metoprolol succinate 25 mg initially

## 2014-12-17 MED ORDER — METOPROLOL SUCCINATE ER 25 MG PO TB24: 25.0000 mg | ORAL_TABLET | Freq: Every day | ORAL | Status: DC

## 2014-12-17 NOTE — Telephone Encounter (Signed)
Left message to call back  

## 2014-12-17 NOTE — Telephone Encounter (Signed)
Spoke with patient   informed her of Dr Claiborne Billings decision Discontinue bystolic- due to no  insurance coverage Start metoprolol succ 25 mg daily. Patient request medication to be sent to [facility]  RN e-sent medication

## 2015-02-04 ENCOUNTER — Telehealth: Payer: Self-pay | Admitting: Cardiovascular Disease

## 2015-02-04 NOTE — Telephone Encounter (Signed)
Pt blood pressure have increased a lot since taking Metoprolol. Please call to advise ,it is time for a refill.

## 2015-02-04 NOTE — Telephone Encounter (Signed)
Spoke with pt, she had to change from bystolic 10 mg to metoprolol 25 mg because of insurance. Her bp since the change has been 137-153/90-95 with pulse 75-95. She denies any other problems. Will forward for dr [facility] review

## 2015-02-06 MED ORDER — METOPROLOL SUCCINATE ER 50 MG PO TB24: 50.0000 mg | ORAL_TABLET | Freq: Every day | ORAL | Status: DC

## 2015-02-06 NOTE — Telephone Encounter (Signed)
Is this metoprolol tartrate or succinate? Will need to increase dose from 25 mg to 50 mg, bid or daily depwnding on type of metoprolol

## 2015-02-06 NOTE — Telephone Encounter (Signed)
Spoke with pt, aware of dr [facility] recommendations. New script sent to the pharmacy electronically.

## 2015-02-14 ENCOUNTER — Ambulatory Visit (INDEPENDENT_AMBULATORY_CARE_PROVIDER_SITE_OTHER): Payer: BLUE CROSS/BLUE SHIELD | Admitting: Neurology

## 2015-02-14 ENCOUNTER — Encounter: Payer: Self-pay | Admitting: Neurology

## 2015-02-14 VITALS — BP 120/84 | HR 85 | Ht 63.0 in | Wt 158.2 lb

## 2015-02-14 DIAGNOSIS — G620 Drug-induced polyneuropathy: Secondary | ICD-10-CM | POA: Diagnosis not present

## 2015-02-14 DIAGNOSIS — G4762 Sleep related leg cramps: Secondary | ICD-10-CM | POA: Diagnosis not present

## 2015-02-14 HISTORY — DX: Sleep related leg cramps: G47.62

## 2015-02-14 NOTE — Progress Notes (Signed)
Reason for visit: Peripheral neuropathy  Jessica Mack is an 59 y.o. 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 The patient has discomfort in the hands and feet that seems to undulate in severity from one day to the next. The patient may have lancinating pains in the hands and feet. She also suffers from nocturnal leg cramps that occur relatively frequently. The patient has to get up and stretch the legs. The cramps may occur in the feet or in the calf muscles. In the past, the use of tonic water has been somewhat effective. The cramps have become more prominent recently. She also suffers from irritable bowel syndrome. She returns to the office for an evaluation. The topical ointments for the peripheral neuropathy were cumbersome, and not completely effective.  Past Medical History  Diagnosis Date  . Peripheral neuropathy   . Hypertriglyceridemia   . History of breast cancer 2005    invasive ductal cancer left breast  . Blood transfusion   . GERD (gastroesophageal reflux disease)   . Ulcer   . Tongue dysplasia   . Irritable bowel syndrome   . Migraines   . Hypertension   . Avascular necrosis of bone     Left talus  . Osteopenia   . Dysplastic nevus 1988    right hip  . Plantar fasciitis 6/04  . Metabolic syndrome     resolved with diet and exercise  . Palpitations 12/13    normal echo  . Nocturnal leg cramps 02/14/2015    Past Surgical History  Procedure Laterality Date  . Wrist surgery  1988    right, DeQuervains  . Cesarean section  1991, 1993    x2  . Laparoscopic cholecystectomy  2003  . Breast lumpectomy  06/2004    left, with port placement  . Ankle surgery  07/2007    left, revascularization  . Shoulder arthroscopy  10/2005    right  . Tongue surgery  2012, 2013  . Colonoscopy  09/2013    Dr. Collene Mares    Family History  Problem Relation Age of Onset  .  Asthma Brother   . Asthma Sister   . Cancer Mother     melanoma  . Cancer Father     colon    Social history:  reports that she has never smoked. She has never used smokeless tobacco. She reports that she drinks about 4.8 - 6.0 oz of alcohol per week. She reports that she does not use illicit drugs.    Allergies  Allergen Reactions  . Augmentin [Amoxicillin-Pot Clavulanate]     abd cramping  . Crestor [Rosuvastatin]     Liver function elevated     Medications:  Prior to Admission medications   Medication Sig Start Date End Date Taking? Authorizing Provider  clonazePAM (KLONOPIN) 1 MG tablet Take 1 tablet (1 mg total) by mouth at bedtime as needed for anxiety. Take 1/2 tablet by mouth at bedtime Patient taking differently: Take 0.5 mg by mouth at bedtime as needed for anxiety. Take 1/2 tablet by mouth at bedtime 09/27/14  Yes Megan Salon, MD  Estradiol 10 MCG TABS vaginal tablet 1 tablet vaginally once weekly 03/30/14  Yes Megan Salon, MD  ezetimibe (ZETIA) 10 MG tablet Take 1 tablet (10 mg total) by mouth daily. 11/18/14  Yes Troy Sine, MD  metoprolol succinate (TOPROL-XL) 50 MG 24 hr  tablet Take 1 tablet (50 mg total) by mouth daily. 02/06/15  Yes Troy Sine, MD  Multiple Vitamins-Minerals (MULTIVITAMIN PO) Take 1 tablet by mouth daily. With calcium   Yes Historical Provider, MD  NONFORMULARY OR COMPOUNDED ITEM Place 1.25 mLs onto the skin 3 (three) times a week. weekly 03/17/13  Yes Megan Salon, MD  Probiotic Product (ALIGN PO) Take 1 tablet by mouth daily.   Yes Historical Provider, MD  tamoxifen (NOLVADEX) 20 MG tablet Take 1 tablet (20 mg total) by mouth daily. 07/17/14  Yes Chauncey Cruel, MD    ROS:  Out of a complete 14 system review of symptoms, the patient complains only of the following symptoms, and all other reviewed systems are negative.  Frequent waking, snoring Environmental allergies Numbness  Blood pressure 120/84, pulse 85, height 5' 3"  (1.6 m),  weight 158 lb 3.2 oz (71.759 kg), last menstrual period 06/26/2004.  Physical Exam  General: The patient is alert and cooperative at the time of the examination. The patient is moderately obese.  Skin: No significant peripheral edema is noted.   Neurologic Exam  Mental status: The patient is alert and oriented x 3 at the time of the examination. The patient has apparent normal recent and remote memory, with an apparently normal attention span and concentration ability.   Cranial nerves: Facial symmetry is present. Speech is normal, no aphasia or dysarthria is noted. Extraocular movements are full. Visual fields are full.  Motor: The patient has good strength in all 4 extremities.  Sensory examination: Soft touch sensation is symmetric on the face, arms, and legs.  Coordination: The patient has good finger-nose-finger and heel-to-shin bilaterally.  Gait and station: The patient has a normal gait. Tandem gait is slightly unsteady. Romberg is negative. No drift is seen.  Reflexes: Deep tendon reflexes are symmetric, but are decreased.   Assessment/Plan:  1. Chemotherapy-induced peripheral neuropathy  2. Nocturnal leg cramps  The patient will give a trial on tonic water to see if this helps her leg cramps. If not, we will call in a prescription for a baclofen to see if this is effective. The patient is not on any medications for her neuropathy pain.  Jill Alexanders MD 02/14/2015 5:46 PM  [facility] [address] [place], [place] [address]  Phone [phone] Fax [phone]

## 2015-02-14 NOTE — Patient Instructions (Signed)

## 2015-03-07 ENCOUNTER — Other Ambulatory Visit: Payer: BC Managed Care – PPO

## 2015-03-14 ENCOUNTER — Ambulatory Visit (HOSPITAL_BASED_OUTPATIENT_CLINIC_OR_DEPARTMENT_OTHER): Payer: BLUE CROSS/BLUE SHIELD | Admitting: Oncology

## 2015-03-14 VITALS — BP 122/81 | HR 80 | Temp 98.1°F | Resp 18 | Ht 63.0 in | Wt 159.7 lb

## 2015-03-14 DIAGNOSIS — C50412 Malignant neoplasm of upper-outer quadrant of left female breast: Secondary | ICD-10-CM

## 2015-03-14 DIAGNOSIS — Z853 Personal history of malignant neoplasm of breast: Secondary | ICD-10-CM | POA: Diagnosis not present

## 2015-03-14 NOTE — Progress Notes (Signed)
ID: Jessica Mack   DOB: 1956/01/08  MR#: [id number]  EQA#:[id number]  PCP: Reynold Bowen GYN: Edwinna Areola SU: Osborn Coho OTHER MD: Netty Starring, Floyde Parkins, Pryor Montes, Shelva Majestic  HISTORY OF PRESENT ILLNESS: Jessica Mack palpated a mass in her left breast while showering 06/2000.  She proceeded to bilateral mammograms at the [facility] the same day, and this showed, as compared to prior, a new asymmetric density in the superior aspect of the left breast, which was soft but palpable.  Ultrasound showed a vague area of persistent shadowing measuring approximately 1.3 cm.   Biopsy was performed the same day and showed ([id number]) an infiltrating ductal carcinoma with focal lobular features.  There was no obvious lymphovascular invasion.  The greatest extent of tumor in the biopsy was 1.3 cm.    The patient saw Dr. Ronnie Derby on 09/02 and had bilateral breast MRIs the same day.  The right breast was negative.  In the left breast there was an oval mass measuring up to 1.8 cm with no lower axillary adenopathy noted. Accordingly, the patient proceeded to left lumpectomy and axillary lymph node dissection as well as port placement 07/01/04.  Results of this procedure and her subsequent history are as detailed below.   INTERVAL HISTORY: Jessica Mack returns today for followup of her breast cancer. She completes 10 years of tamoxifen this month. She has generally tolerated that well, with some hot flashes as the main symptom.  REVIEW OF SYSTEMS: She and her husband Elta Guadeloupe are going through a difficult. Because there elderly dog is rapidly declining. Jessica Mack herself had some shoulder problems and had a partial repair of the right shoulder under Joni Fears. She has mild IBS symptoms. The peripheral neuropathy is an almost daily reminder of the cancer problem, but she does not fret over it and she has a normal functional status overall. She is exercising chiefly by doing Pilates and walking at this point. A detailed  review of systems today was otherwise stable   PAST MEDICAL HISTORY: Past Medical History  Diagnosis Date  . Peripheral neuropathy   . Hypertriglyceridemia   . History of breast cancer 2005    invasive ductal cancer left breast  . Blood transfusion   . GERD (gastroesophageal reflux disease)   . Ulcer   . Tongue dysplasia   . Irritable bowel syndrome   . Migraines   . Hypertension   . Avascular necrosis of bone     Left talus  . Osteopenia   . Dysplastic nevus 1988    right hip  . Plantar fasciitis 6/04  . Metabolic syndrome     resolved with diet and exercise  . Palpitations 12/13    normal echo  . Nocturnal leg cramps 02/14/2015    PAST SURGICAL HISTORY: Past Surgical History  Procedure Laterality Date  . Wrist surgery  1988    right, DeQuervains  . Cesarean section  1991, 1993    x2  . Laparoscopic cholecystectomy  2003  . Breast lumpectomy  06/2004    left, with port placement  . Ankle surgery  07/2007    left, revascularization  . Shoulder arthroscopy  10/2005    right  . Tongue surgery  2012, 2013  . Colonoscopy  09/2013    Dr. Collene Mares    FAMILY HISTORY Family History  Problem Relation Age of Onset  . Asthma Brother   . Asthma Sister   . Cancer Mother     melanoma  . Cancer Father  colon  Both parents are alive. Her father had a colon polyp removed remotely.  Her mother has some hypercholesterolemia.  There is no history of breast or ovarian cancer in the family.  A 57 year old sister has some asthma.  A 73 year old brother has some weight problems.  GYNECOLOGIC HISTORY: GX P2. 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 Her husband Elta Guadeloupe is a Chief Executive Officer, Company secretary, and businessman. Daughter [name] is currently 30 years old and is a Probation officer. Son [name] is attending [employer], in Engineer, production and contemplating a move to [place]  [employer]  ADVANCED DIRECTIVES: in place  HEALTH MAINTENANCE: History  Substance Use Topics  . Smoking status: Never Smoker   . Smokeless tobacco: Never Used  . Alcohol Use: 4.8 - 6.0 oz/week    8-10 Glasses of wine per week     Comment: wine daily     Colonoscopy: [facility]  PAP:  Bone density: October 2015, T score of -2.3   Lipid panel:  Allergies  Allergen Reactions  . Augmentin [Amoxicillin-Pot Clavulanate]     abd cramping  . Crestor [Rosuvastatin]     Liver function elevated     Current Outpatient Prescriptions  Medication Sig Dispense Refill  . clonazePAM (KLONOPIN) 1 MG tablet Take 1 tablet (1 mg total) by mouth at bedtime as needed for anxiety. Take 1/2 tablet by mouth at bedtime (Patient taking differently: Take 0.5 mg by mouth at bedtime as needed for anxiety. Take 1/2 tablet by mouth at bedtime) 90 tablet 1  . Estradiol 10 MCG TABS vaginal tablet 1 tablet vaginally once weekly    . ezetimibe (ZETIA) 10 MG tablet Take 1 tablet (10 mg total) by mouth daily. 90 tablet 2  . metoprolol succinate (TOPROL-XL) 50 MG 24 hr tablet Take 1 tablet (50 mg total) by mouth daily. 30 tablet 12  . Multiple Vitamins-Minerals (MULTIVITAMIN PO) Take 1 tablet by mouth daily. With calcium    . NONFORMULARY OR COMPOUNDED ITEM Place 1.25 mLs onto the skin 3 (three) times a week. weekly    . Probiotic Product (ALIGN PO) Take 1 tablet by mouth daily.    . tamoxifen (NOLVADEX) 20 MG tablet Take 1 tablet (20 mg total) by mouth daily. 90 tablet 2   No current facility-administered medications for this visit.    OBJECTIVE: Middle-aged white woman who appears well Filed Vitals:   03/14/15 1215  BP: 122/81  Pulse: 80  Temp: 98.1 F (36.7 C)  Resp: 18     Body mass index is 28.3 kg/(m^2).    ECOG FS: 1  Sclerae unicteric, pupils round and equal Oropharynx clear, dentition in good repair No cervical or supraclavicular adenopathy Lungs no rales or rhonchi Heart regular rate and rhythm Abd  soft, nontender, positive bowel sounds MSK no focal spinal tenderness, no upper extremity lymphedema Neuro: nonfocal, well oriented, appropriate affect Breasts: The left breast is status post lumpectomy and radiation. It is slightly smaller than the right. There is no evidence of local recurrence. The left axilla is benign.   The patient pinked up for her graduation visit:     LAB RESULTS: Labs drawn earlier this week at Dr. Baldwin Crown office: Results pending  Lab Results  Component Value Date   WBC 6.5 07/09/2014   NEUTROABS 4.2 07/09/2014   HGB 13.3 07/09/2014   HCT 38.5 07/09/2014   MCV 91.4 07/09/2014   PLT 176  07/09/2014      Chemistry      Component Value Date/Time   NA 142 07/09/2014 1420   NA 143 03/16/2014 0814   K 4.1 07/09/2014 1420   K 4.3 03/16/2014 0814   CL 107 03/16/2014 0814   CL 105 07/07/2012 1505   CO2 24 07/09/2014 1420   CO2 26 03/16/2014 0814   BUN 16.6 07/09/2014 1420   BUN 13 03/16/2014 0814   CREATININE 0.9 07/09/2014 1420   CREATININE 0.76 03/16/2014 0814   CREATININE 0.56 07/17/2011 1455      Component Value Date/Time   CALCIUM 9.5 07/09/2014 1420   CALCIUM 9.2 03/16/2014 0814   ALKPHOS 57 07/09/2014 1420   ALKPHOS 40 05/17/2014 1548   AST 34 07/09/2014 1420   AST 61* 05/17/2014 1548   ALT 46 07/09/2014 1420   ALT 69* 05/17/2014 1548   BILITOT 0.58 07/09/2014 1420   BILITOT 0.6 05/17/2014 1548       Lab Results  Component Value Date   LABCA2 15 07/17/2011    No components found for: [id number]  No results for input(s): INR in the last 168 hours.  Urinalysis No results found for: COLORURINE  STUDIES: CLINICAL DATA: Screening.  EXAM: DIGITAL SCREENING BILATERAL MAMMOGRAM WITH 3D TOMO WITH CAD  COMPARISON: Previous exam(s).  ACR Breast Density Category b: There are scattered areas of fibroglandular density.  FINDINGS: Stable post lumpectomy changes on the left. There are no findings suspicious for malignancy.  Images were processed with CAD.  IMPRESSION: No mammographic evidence of malignancy. A result letter of this screening mammogram will be mailed directly to the patient.  RECOMMENDATION: Screening mammogram in one year. (Code:SM-B-01Y)  BI-RADS CATEGORY 1: Negative.   Electronically Signed  By: Enrique Sack M.D.  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 lumpectomy and sentinel lymph node biopsy September 2005 for a T1c N0, stage IA invasive ductal carcinoma, grade 2, estrogen and progesterone receptor-positive, HER-2-negative  (1) treated with cyclophosphamide and doxorubicin x4 in dose-dense fashion, followed by weekly paclitaxel x12  (2) adjuvant radiation completed May 2005   (3) on tamoxifen between May 2006 and May 2011, at which point she switched to anastrozole, switching back to tamoxifen September 2013--completing 10 years of antiestrogen therapy May 2016  PLAN:  Kotowski is now little over 10 years out from her definitive surgery for breast cancer, with no evidence of disease recurrence. This is very favorable.  She has also completed 10 years of antiestrogen therapy. We do not have data that continuing beyond 10 years is helpful, harmful, or makes any difference one way or the other. Accordingly the standard of care is to stop anti-estrogens at this point.  I am comfortable releasing her to her primary care physician's care. All she will need as far as breast cancer surveillance's concern is yearly mammography, preferably with tomosynthesis, and yearly physician breast exam  I will be glad to see Eagles again at any point in the future if the need arises, but as of now we are making no further routine appointment for her here.   MAGRINAT,GUSTAV C    03/14/2015

## 2015-03-22 ENCOUNTER — Telehealth: Payer: Self-pay | Admitting: Obstetrics & Gynecology

## 2015-03-22 MED ORDER — FLUCONAZOLE 200 MG PO TABS: ORAL_TABLET | ORAL | Status: DC

## 2015-03-22 NOTE — Telephone Encounter (Signed)
Return call to patient. States she is having some extensive dental work done and Facilities manager e-ray showed sinus infection that will be treated with Amoxicillin 850 mg BID X 10 days. Patient states she was given RX for 2 tabs of Diflucan but she is concerned this may not work. She had very bad yeast infection after last Amoxicillin treatment and was treated with Diflucan and Terazol. Calling to get Dr Ammie Ferrier recommendation for best way to prevent another yeast infection. She has not started antibiotics yet.

## 2015-03-22 NOTE — Telephone Encounter (Signed)
Spoke with pt regarding recommendations.  She is comfortable with this.  She still has some Terazol at home since treatment in December.

## 2015-03-22 NOTE — Telephone Encounter (Signed)
We could do a Diflucan every three days during the antibiotics and then for two doses after.  She could start maybe the second day of antibiotics--so take days 2, 5, 8, 11, and 14.  So, five doses.  Could also do the 200mg  dose and not the 150mg  dosage.  Ok to send in #5/0RF.

## 2015-03-22 NOTE — Telephone Encounter (Signed)
Dr. Tamala Julian wants to speak with Dr. Sabra Heck no information given.

## 2015-04-09 ENCOUNTER — Encounter: Payer: Self-pay | Admitting: Obstetrics & Gynecology

## 2015-04-09 ENCOUNTER — Ambulatory Visit (INDEPENDENT_AMBULATORY_CARE_PROVIDER_SITE_OTHER): Payer: BLUE CROSS/BLUE SHIELD | Admitting: Obstetrics & Gynecology

## 2015-04-09 VITALS — BP 112/78 | HR 64 | Resp 16 | Ht 63.5 in | Wt 156.0 lb

## 2015-04-09 DIAGNOSIS — Z01419 Encounter for gynecological examination (general) (routine) without abnormal findings: Secondary | ICD-10-CM | POA: Diagnosis not present

## 2015-04-09 DIAGNOSIS — Z124 Encounter for screening for malignant neoplasm of cervix: Secondary | ICD-10-CM

## 2015-04-09 MED ORDER — CLONAZEPAM 1 MG PO TABS: 1.0000 mg | ORAL_TABLET | Freq: Every day | ORAL | Status: DC

## 2015-04-09 NOTE — Progress Notes (Signed)
59 y.o. G2P2 MarriedCaucasianF here for annual exam.  No vaginal bleeding.    Fasting glucose was 108.  Has changed some of her diet specifically wine.  Triglycerides were 1400.    Exercising now mostly with piliates.    Released from Dr. Jana Hakim.  Doing screening MMG only now.    Patient's last menstrual period was 06/26/2004.          Sexually active: Yes.    The current method of family planning is post menopausal status.    Exercising: Yes.    pilates, trx, wall Smoker:  no  Health Maintenance: Pap:  03/30/14 WNL History of abnormal Pap:  Yes ASCUS, negative HR HPV 2013  MMG:  07/12/14 3D-normal Colonoscopy:  2014-repeat in 5 years Dr Collene Mares BMD:   10/15 TDaP:  2007 Screening Labs: Dr Forde Dandy, Hb today: Dr Forde Dandy, Urine today: Dr Forde Dandy   reports that she has never smoked. She has never used smokeless tobacco. She reports that she drinks about 1.2 - 1.8 oz of alcohol per week. She reports that she does not use illicit drugs.  Past Medical History  Diagnosis Date  . Peripheral neuropathy   . Hypertriglyceridemia   . History of breast cancer 2005    invasive ductal cancer left breast  . Blood transfusion   . GERD (gastroesophageal reflux disease)   . Ulcer   . Tongue dysplasia   . Irritable bowel syndrome   . Migraines   . Hypertension   . Avascular necrosis of bone     Left talus  . Osteopenia   . Dysplastic nevus 1988    right hip  . Plantar fasciitis 6/04  . Metabolic syndrome     resolved with diet and exercise  . Palpitations 12/13    normal echo  . Nocturnal leg cramps 02/14/2015    Past Surgical History  Procedure Laterality Date  . Wrist surgery  1988    right, DeQuervains  . Cesarean section  1991, 1993    x2  . Laparoscopic cholecystectomy  2003  . Breast lumpectomy  06/2004    left, with port placement  . Ankle surgery  07/2007    left, revascularization  . Shoulder arthroscopy  10/2005    right  . Tongue surgery  2012, 2013  . Colonoscopy  09/2013     Dr. Collene Mares  . Shoulder surgery  12/15    left  . Mouth surgery  11/15    implant/crown placed 4/16    Current Outpatient Prescriptions  Medication Sig Dispense Refill  . clonazePAM (KLONOPIN) 1 MG tablet Take 1 tablet (1 mg total) by mouth at bedtime as needed for anxiety. Take 1/2 tablet by mouth at bedtime (Patient taking differently: Take 0.5 mg by mouth at bedtime as needed for anxiety. Take 1/2 tablet by mouth at bedtime) 90 tablet 1  . Estradiol 10 MCG TABS vaginal tablet 1 tablet vaginally once weekly    . ezetimibe (ZETIA) 10 MG tablet Take 1 tablet (10 mg total) by mouth daily. 90 tablet 2  . Multiple Vitamins-Minerals (MULTIVITAMIN PO) Take 1 tablet by mouth daily. With calcium    . NONFORMULARY OR COMPOUNDED ITEM Place 1.25 mLs onto the skin 3 (three) times a week. weekly    . Probiotic Product (ALIGN PO) Take 1 tablet by mouth daily.    . fluconazole (DIFLUCAN) 200 MG tablet Finished last week  0   No current facility-administered medications for this visit.    Family History  Problem  Relation Age of Onset  . Asthma Brother   . Asthma Sister   . Cancer Mother     melanoma  . Cancer Father     colon    ROS:  Pertinent items are noted in HPI.  Otherwise, a comprehensive ROS was negative.  Exam:   BP 112/78 mmHg  Pulse 64  Resp 16  Ht 5' 3.5" (1.613 m)  Wt 156 lb (70.761 kg)  BMI 27.20 kg/m2  LMP 06/26/2004  Weight change: +2#   Height: 5' 3.5" (161.3 cm)  Ht Readings from Last 3 Encounters:  04/09/15 5' 3.5" (1.613 m)  03/14/15 5\' 3"  (1.6 m)  02/14/15 5\' 3"  (1.6 m)    General appearance: alert, cooperative and appears stated age Head: Normocephalic, without obvious abnormality, atraumatic Neck: no adenopathy, supple, symmetrical, trachea midline and thyroid normal to inspection and palpation Lungs: clear to auscultation bilaterally Breasts: normal right breast, firmness along left lumpectomy scar, no changes, no LAD bilaterally Heart: regular rate and  rhythm Abdomen: soft, non-tender; bowel sounds normal; no masses,  no organomegaly Extremities: extremities normal, atraumatic, no cyanosis or edema Skin: Skin color, texture, turgor normal. No rashes or lesions Lymph nodes: Cervical, supraclavicular, and axillary nodes normal. No abnormal inguinal nodes palpated Neurologic: Grossly normal   Pelvic: External genitalia:  At base vagina, thickened whitish tissue present, lesser amt of this noted this year                                      Urethra:  normal appearing urethra with no masses, tenderness or lesions              Bartholins and Skenes: normal                 Vagina: normal appearing vagina with normal color and discharge, no lesions              Cervix: no lesions              Pap taken: Yes.   Bimanual Exam:  Uterus:  normal size, contour, position, consistency, mobility, non-tender              Adnexa: normal adnexa and no mass, fullness, tenderness               Rectovaginal: Confirms               Anus:  normal sphincter tone, no lesions  Chaperone was present for exam.  A:  Well Woman with normal exam  PMP, no HRT  H/O breast cancer 9/05 s/p lumpectomy, sentinel node biopsy, s/p chemotherapy and radiation, s/p tamoxifen.  Completed 10 years of therapy on Tamoxifen H/o metabolic syndrome with triglycerides this year >1400 Labile BP, on Beta blocker, being followed by cardiology  Chronic insomnia. Off Ambien now for several years Chemotherapy induced neuropathy  Vaginal atrophy  Thickened, white vulvar skin changes. Bx showed benign mucosa with hyperplasia and hyperkeratosis  Osteopenia. Last BMD 10/15.  Repeat next year.   H/O elevated LFTs.  Being followed by Dr. Forde Dandy.  P: Mammogram yearly with MRI every other year. MRI done 9/14. Due 9/16.  Reminder entered. Pap smear today. ASCUS with neg HR HPV 2013 Sees Dr. Forde Dandy every six months  Clonazepam 1mg  qhs daily. #90/1RF Vagifem 37meq pv twice  weekly. No rx needed right now.   No rx needed for topical testosterone  prioprionate 1% in petrolatum. Small amt two to three times weekly.  return annually or prn

## 2015-04-11 ENCOUNTER — Telehealth: Payer: Self-pay | Admitting: *Deleted

## 2015-04-11 LAB — IPS PAP TEST WITH REFLEX TO HPV

## 2015-04-11 NOTE — Telephone Encounter (Signed)
Called [facility] and verified that patient is to take Klonopin 1 mg one tablet at bedtime #90/1 rfs to pharmacist "Genie".  Routed to provider for review, encounter closed.

## 2015-04-11 NOTE — Telephone Encounter (Signed)
Yes.  1mg  at bedtime.  #90/1RF.  Thanks.

## 2015-04-11 NOTE — Telephone Encounter (Signed)
Fx from [facility] wanting to clarify directions for patient's Klonopin 1 mg rx that was sent 04/09/15.  Directions were written as take 1 tablet by mouth at bedtime. Take 1/2 tablet by mouth at bedtime.  Is 1 tablet by mouth at bedtime correct?  Please advise

## 2015-06-12 ENCOUNTER — Other Ambulatory Visit: Payer: Self-pay

## 2015-06-12 ENCOUNTER — Encounter: Payer: Self-pay | Admitting: Oncology

## 2015-06-12 DIAGNOSIS — Z1231 Encounter for screening mammogram for malignant neoplasm of breast: Secondary | ICD-10-CM

## 2015-06-19 ENCOUNTER — Other Ambulatory Visit: Payer: Self-pay | Admitting: *Deleted

## 2015-06-19 DIAGNOSIS — C50412 Malignant neoplasm of upper-outer quadrant of left female breast: Secondary | ICD-10-CM

## 2015-06-19 DIAGNOSIS — Z1239 Encounter for other screening for malignant neoplasm of breast: Secondary | ICD-10-CM

## 2015-07-03 ENCOUNTER — Encounter: Payer: Self-pay | Admitting: Oncology

## 2015-07-08 ENCOUNTER — Telehealth: Payer: Self-pay | Admitting: Emergency Medicine

## 2015-07-08 NOTE — Telephone Encounter (Signed)
-----   Message from Megan Salon, MD sent at 07/07/2015  6:37 AM EDT ----- Regarding: breast MRi Jessica Mack, Dr. Margaretha Glassing has hx of breast cancer and gets MRI every other other year.  3D MMG is scheduled for 07/24/15 and an order for the MRI is present.  Do we need to do anything else?  Just checking.  MSM

## 2015-07-24 ENCOUNTER — Ambulatory Visit
Admission: RE | Admit: 2015-07-24 | Discharge: 2015-07-24 | Disposition: A | Discharge status: Home / Self Care | Payer: BLUE CROSS/BLUE SHIELD | Source: Ambulatory Visit

## 2015-07-24 DIAGNOSIS — Z1231 Encounter for screening mammogram for malignant neoplasm of breast: Secondary | ICD-10-CM

## 2015-07-25 NOTE — Telephone Encounter (Addendum)
Screening 3D Mammogram was completed and negative. MRI bilteral with and without constrast ordered by Dr. Jana Hakim.  Last MRI was completed at [facility].  Calling to see if patient would like to schedule at [facility] or [place].  [facility] radi[facility] scheduling [phone] Message left to return call to [place] at [phone].

## 2015-07-26 NOTE — Telephone Encounter (Signed)
Dr. Tamala Julian returned call.  She states she does not have a preference for location. She has emailed Dr. Starleen Arms office and unsure how to proceed.  Advised I will send order to [place] [facility] and request that she be contacted to schedule Breast MRI. If pre-certification is required, [facility] will contact us.  Dr. Tamala Julian agreeable.  Faxed order to [facility] at this time.

## 2015-07-30 NOTE — Telephone Encounter (Signed)
Patient is scheduled for bilateral breast MRI at [facility] on 08/14/15 at 0815.   cc Kerry Hough for insurance pre-certification if necessary.  Routing to provider for final review. Patient agreeable to disposition. Will close encounter to Dr. Sabra Heck.

## 2015-08-06 ENCOUNTER — Telehealth: Payer: Self-pay | Admitting: Obstetrics & Gynecology

## 2015-08-06 NOTE — Telephone Encounter (Signed)
Attempted call to patient to notify of status of MRI precert. Left voicemail to return call.

## 2015-08-06 NOTE — Telephone Encounter (Signed)
Jessica Mack spoke with patient. Patient aware of status of mri precert. After patient call ended, [facility] contacted office and gave instructions for appeal process. Dr Sabra Heck aware of appeals process. Will send all documentation 08/07/15.

## 2015-08-07 ENCOUNTER — Other Ambulatory Visit: Payer: Self-pay | Admitting: Oncology

## 2015-08-14 ENCOUNTER — Inpatient Hospital Stay: Admission: RE | Admit: 2015-08-14 | Payer: BLUE CROSS/BLUE SHIELD | Source: Ambulatory Visit

## 2015-08-20 ENCOUNTER — Telehealth: Payer: Self-pay | Admitting: *Deleted

## 2015-08-20 NOTE — Telephone Encounter (Signed)
Form,[employer],sent to [employer] [place] and Dr Jannifer Franklin 08/20/15.

## 2015-08-22 NOTE — Telephone Encounter (Signed)
Completed, signed and returned to [place].

## 2015-08-26 ENCOUNTER — Telehealth: Payer: Self-pay | Admitting: Emergency Medicine

## 2015-08-26 ENCOUNTER — Telehealth: Payer: Self-pay | Admitting: *Deleted

## 2015-08-26 NOTE — Telephone Encounter (Signed)
-----   Message from Megan Salon, MD sent at 08/24/2015  4:41 AM EDT ----- Regarding: Screening MMG and MRI I had in my "reminders" that Dr. Tamala Julian needed and MRI this year.  It was not approved by insurance.  I communicated with Dr. Jana Hakim and here was his response about whether she needed and MRI this year:   The short answer is with the advent of tomography we no longer recommend every other year MRI in patients like her.   We can discuss the history of MRI use in [facility] etc but the treand now is to get away from them as there is no survival advantage and they increase flse positives   Hope this helps! UI'll be glad to call if she wants more discussion   gus    So, he says 3D mammogram yearly is all she needs as breast MRI doesn't increase survival rates but do increase false positives.  She should just get a 3D MMG this year.    If she wants him to call, I'll be glad to let me know.  Thanks.  Vinnie Level

## 2015-08-26 NOTE — Telephone Encounter (Signed)
Dr. Tamala Julian notified of message from Dr. Sabra Heck and Dr. Jana Hakim.  She verbalizes understanding. 3D mammogram completed 07/24/15.  She offers thanks to Kerry Hough for her help with insurance pre-certification and follow up.  Routing to provider for final review. Patient agreeable to disposition. Will close encounter.

## 2015-08-26 NOTE — Telephone Encounter (Signed)
Form,[employer] received ,completed by Dr Jannifer Franklin and Charisse Klinefelter at front desk for patient 08/26/15.

## 2015-10-24 ENCOUNTER — Other Ambulatory Visit: Payer: Self-pay | Admitting: Obstetrics & Gynecology

## 2015-10-24 MED ORDER — ESTRADIOL 10 MCG VA TABS: 10.0000 ug | ORAL_TABLET | VAGINAL | Status: DC

## 2015-10-24 NOTE — Telephone Encounter (Signed)
Medication refill request: Vagifem  Last AEX:  04/09/15 SM  Next AEX: 07/03/16 SM  Last MMG (if hormonal medication request): 07/24/15 [id number]:neg Refill authorized: please advise .

## 2015-10-24 NOTE — Telephone Encounter (Signed)
Patient is requesting a refill for Vagifem. She is asking for a 90 day supply to be sent to [facility].

## 2015-10-25 ENCOUNTER — Other Ambulatory Visit: Payer: Self-pay | Admitting: Obstetrics & Gynecology

## 2015-10-25 MED ORDER — ESTRADIOL 10 MCG VA TABS: 10.0000 ug | ORAL_TABLET | VAGINAL | Status: DC

## 2015-10-25 NOTE — Telephone Encounter (Signed)
Prescription printed due to long sig.  Sig corrected by Dr. Sabra Heck in Orders Only encounter. Message left for patient advising RX had been sent to [facility] this morning with sig to use twice weekly although she may only be using once weekly. To call with any questions.

## 2015-11-27 ENCOUNTER — Encounter: Payer: Self-pay | Admitting: Obstetrics & Gynecology

## 2015-11-28 ENCOUNTER — Telehealth: Payer: Self-pay | Admitting: Emergency Medicine

## 2015-11-28 NOTE — Telephone Encounter (Signed)
Patient sent mychart message with request to have Tdap vaccine update. Okay per Dr. Sabra Heck.  Nurse visit scheduled for 12/02/15 at 0830 for Tdap.

## 2015-11-28 NOTE — Telephone Encounter (Signed)
Call to Dr. Tamala Julian and tdap appointment nurse visit scheduled for 12/02/15 at  0830.  Routing to provider for final review. Patient agreeable to disposition. Will close encounter.

## 2015-11-28 NOTE — Telephone Encounter (Signed)
Chief Complaint  Patient presents with  . Advice Only    Tdap vaccine     ===View-only below this line===   ----- Message -----    From: [name]    Sent: 11/27/2015  3:49 PM EST      To: Lyman Speller, MD Subject: Non-Urgent Medical Question  Hi!  I will be due for my 10 year Tdap in April.  I am going to [place] in March and would like to go ahead and update my shot before I go.  My appointment is not until June.  Do I need an appointment?  Thanks.  Margaretha Glassing, MD

## 2015-12-02 ENCOUNTER — Ambulatory Visit: Payer: BLUE CROSS/BLUE SHIELD

## 2015-12-09 ENCOUNTER — Telehealth: Payer: Self-pay

## 2015-12-09 ENCOUNTER — Encounter: Payer: Self-pay | Admitting: Obstetrics & Gynecology

## 2015-12-09 NOTE — Telephone Encounter (Signed)
Visit Follow-Up Question  Message [id number]   From  JATAYA WANN   To  Megan Salon, MD   Sent  12/09/2015 11:47 AM     Hi [name],  After consultation with my colleagues, I have decided to get the Tdap again. The consensus was since pertussis is still on the rise and I am going to [place] that I should just go ahead and get it. Can you give me a call to reschedule please?  This week Thursday at 9 or Friday at 8:30 works. Thanks.  Jessica Nap, MD      Responsible Party    Pool - [facility] Clinical Pool No one has taken responsibility for this message.     No actions have been taken on this message.     Spoke with patient regarding [employer]. She would like to receive Tdap vaccination at this time. Reports last tetanus was in 04/07. Nurse appointment scheduled for 12/12/2015 at 9 am. She is agreeable to date and time.  Routing to provider for final review. Patient agreeable to disposition. Will close encounter.

## 2015-12-09 NOTE — Telephone Encounter (Signed)
Spoke with patient regarding mychart encounter. Please see telephone call dated with today's date.  Routing to provider for final review. Patient agreeable to disposition. Will close encounter.

## 2015-12-12 ENCOUNTER — Ambulatory Visit (INDEPENDENT_AMBULATORY_CARE_PROVIDER_SITE_OTHER): Payer: BLUE CROSS/BLUE SHIELD

## 2015-12-12 VITALS — BP 106/72 | HR 76 | Resp 16 | Wt 155.0 lb

## 2015-12-12 DIAGNOSIS — Z23 Encounter for immunization: Secondary | ICD-10-CM | POA: Diagnosis not present

## 2015-12-12 NOTE — Progress Notes (Signed)
Patient here for Tdap immunizations.  She denies any recent illness and understands benefits as well as side effects.    Patient tolerated injection well in RIGHT deltoid.  She has no other compliants or concerns.

## 2016-01-30 ENCOUNTER — Other Ambulatory Visit: Payer: Self-pay | Admitting: *Deleted

## 2016-01-30 MED ORDER — CLONAZEPAM 1 MG PO TABS: 1.0000 mg | ORAL_TABLET | Freq: Every day | ORAL | Status: DC

## 2016-01-30 NOTE — Telephone Encounter (Signed)
Medication refill request from [facility]: Clonazepam Tablet Last AEX:  04/09/15 with SM Next AEX:  07/03/16 with SM  Last MMG (if hormonal medication request): 07/24/15 breast den category b; bi-rads 1: negative Refill authorized: Please Advise

## 2016-01-30 NOTE — Telephone Encounter (Signed)
RX printed, signed and faxed to [facility].

## 2016-02-17 ENCOUNTER — Ambulatory Visit (INDEPENDENT_AMBULATORY_CARE_PROVIDER_SITE_OTHER): Payer: BLUE CROSS/BLUE SHIELD | Admitting: Neurology

## 2016-02-17 ENCOUNTER — Encounter: Payer: Self-pay | Admitting: Neurology

## 2016-02-17 VITALS — BP 120/82 | HR 76 | Ht 63.5 in | Wt 154.5 lb

## 2016-02-17 DIAGNOSIS — G620 Drug-induced polyneuropathy: Secondary | ICD-10-CM

## 2016-02-17 NOTE — Progress Notes (Signed)
Reason for visit: Peripheral neuropathy  Jessica Mack is an 60 y.o. 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 The patient has some discomfort in the extremities, she reports a vibration sensation and occasional lancinating type pains in the legs, some cramps in the feet at times. She drinks a lot of tonic water which seems to help. She has not wanted to go on oral medications for the symptoms. The patient has been tried on a multitude of medications previously with poor tolerance. Topical agents for the neuropathy were not well tolerated. The patient is on disability for her neuropathic symptoms. She has some mild gait instability, but no recent falls. She likes to ride bicycles to stay active. She is having some difficulty sleeping, but primarily because of some discomfort in her shoulders.  Past Medical History  Diagnosis Date  . Peripheral neuropathy ([place])   . Hypertriglyceridemia   . History of breast cancer 2005    invasive ductal cancer left breast  . Blood transfusion   . GERD (gastroesophageal reflux disease)   . Ulcer   . Tongue dysplasia   . Irritable bowel syndrome   . Migraines   . Hypertension   . Avascular necrosis of bone (HCC)     Left talus  . Osteopenia   . Dysplastic nevus 1988    right hip  . Plantar fasciitis 6/04  . Metabolic syndrome     resolved with diet and exercise  . Palpitations 12/13    normal echo  . Nocturnal leg cramps 02/14/2015    Past Surgical History  Procedure Laterality Date  . Wrist surgery  1988    right, DeQuervains  . Cesarean section  1991, 1993    x2  . Laparoscopic cholecystectomy  2003  . Breast lumpectomy  06/2004    left, with port placement  . Ankle surgery  07/2007    left, revascularization  . Shoulder arthroscopy  10/2005    right    . Tongue surgery  2012, 2013  . Colonoscopy  09/2013    Dr. Collene Mares  . Shoulder surgery  12/15    left  . Mouth surgery  11/15    implant/crown placed 4/16    Family History  Problem Relation Age of Onset  . Asthma Brother   . Asthma Sister   . Cancer Mother     melanoma  . Cancer Father     colon  . Prostate cancer Father     Social history:  reports that she has never smoked. She has never used smokeless tobacco. She reports that she drinks about 1.2 - 1.8 oz of alcohol per week. She reports that she does not use illicit drugs.    Allergies  Allergen Reactions  . Augmentin [Amoxicillin-Pot Clavulanate]     abd cramping  . Cephalexin     Profuse diarrhea  . Crestor [Rosuvastatin]     Liver function elevated     Medications:  Prior to Admission medications   Medication Sig Start Date End Date Taking? Authorizing Provider  clonazePAM (KLONOPIN) 1 MG tablet Take 1 tablet (1 mg total) by mouth at bedtime. Take 1/2 tablet by mouth at bedtime Patient taking differently: Take 0.5 mg by mouth at bedtime as needed.  01/30/16  Yes Megan Salon, MD  Estradiol 10 MCG  TABS vaginal tablet Place 1 tablet (10 mcg total) vaginally 2 (two) times a week. Patient taking differently: Place 10 mcg vaginally once a week.  10/25/15  Yes Megan Salon, MD  Multiple Vitamins-Minerals (MULTIVITAMIN PO) Take 1 tablet by mouth daily. With calcium   Yes Historical Provider, MD  nebivolol (BYSTOLIC) 10 MG tablet Take 10 mg by mouth daily.   Yes Historical Provider, MD  NONFORMULARY OR COMPOUNDED ITEM Place 1.25 mLs onto the skin 3 (three) times a week. weekly 03/17/13  Yes Megan Salon, MD  Omega-3-acid Ethyl Esters (LOVAZA PO) Take by mouth. 2G twice daily   Yes Historical Provider, MD  Probiotic Product (ALIGN PO) Take 1 tablet by mouth daily.   Yes Historical Provider, MD    ROS:  Out of a complete 14 system review of symptoms, the patient complains only of the following symptoms, and all other  reviewed systems are negative.  Joint pain Numbness  Blood pressure 120/82, pulse 76, height 5' 3.5" (1.613 m), weight 154 lb 8 oz (70.081 kg), last menstrual period 06/26/2004.  Physical Exam  General: The patient is alert and cooperative at the time of the examination.  Skin: No significant peripheral edema is noted.   Neurologic Exam  Mental status: The patient is alert and oriented x 3 at the time of the examination. The patient has apparent normal recent and remote memory, with an apparently normal attention span and concentration ability.   Cranial nerves: Facial symmetry is present. Speech is normal, no aphasia or dysarthria is noted. Extraocular movements are full. Visual fields are full.  Motor: The patient has good strength in all 4 extremities.  Sensory examination: Soft touch sensation is symmetric on the face, arms, and legs.  Coordination: The patient has good finger-nose-finger and heel-to-shin bilaterally.  Gait and station: The patient has a normal gait. Tandem gait is slightly unsteady. Romberg is negative. No drift is seen.  Reflexes: Deep tendon reflexes are symmetric, but are depressed.   Assessment/Plan:  1. Peripheral neuropathy  The patient continues to have some ongoing discomfort, she is not on any medications for her neuropathy at this time. She is having some muscle cramps that have been improved by simply drinking tonic water. The patient will follow-up through this office in one year, sooner if needed.  Jill Alexanders MD 02/17/2016 8:38 PM  [facility] [address] [place], [place] [address]  Phone [phone] Fax [phone]

## 2016-02-17 NOTE — Patient Instructions (Signed)

## 2016-02-20 ENCOUNTER — Encounter: Payer: Self-pay | Admitting: Obstetrics & Gynecology

## 2016-02-20 ENCOUNTER — Telehealth: Payer: Self-pay

## 2016-02-20 MED ORDER — FLUCONAZOLE 150 MG PO TABS: ORAL_TABLET | ORAL | Status: DC

## 2016-02-20 NOTE — Telephone Encounter (Signed)
Non-Urgent Medical Question  Message [id number]   From  NAUTIA LERNER   To  Megan Salon, MD   Sent  02/20/2016 9:44 AM     Good morning! Once again I find myself needing oral/dental surgery. This time, I will be on 2 weeks of QID Clindamycin. I know I need to watch out for C. Diff symptoms but am wondering if I should do a preemptive strike against yeast also. It worked well last year when I did this (I think I took Diflucan every 3 days during the ATB and for a couple of doses after) although I was on Amoxicillin that time. He wants me to start the ATB today, surgery is 5/1, and continue the ATB for an additional week after, during which time I will be driving to the [place]. Don't want a yeast infection on the road. I am interested in Dr. Sanjuan Dame thoughts on this. Many thanks! Margaretha Glassing      Responsible Party    Pool - [facility] Clinical Pool No one has taken responsibility for this message.     No actions have been taken on this message.     Routing to [facility] for review and advise as Dr.Miller is out of the office this week.

## 2016-02-20 NOTE — Telephone Encounter (Signed)
Order for Diflucan 150 mg po q 72 hours prn during abx and one dose afterward prn #6 0RF sent to [facility] on file per patient preference. Mychart message sent notifying the patient her prescription has been sent in.  Routing to provider for final review. Patient agreeable to disposition. Will close encounter.

## 2016-02-20 NOTE — Telephone Encounter (Signed)
Telephone encounter created to discuss mychart message with Dr.Silva as Dr.Miller is out of the office this week.

## 2016-02-20 NOTE — Telephone Encounter (Signed)
Ok for Dilfucan 150 mg po q 72 hours prn during abx and one dose afterward prn. Dispense:  6, RF none.

## 2016-02-20 NOTE — Telephone Encounter (Signed)
Mychart message sent back to patient with message from Dr.Silva as seen below and requesting preferred pharmacy so I may send in rx.

## 2016-03-31 ENCOUNTER — Telehealth: Payer: Self-pay | Admitting: Neurology

## 2016-03-31 NOTE — Telephone Encounter (Signed)
Office Notes from 2016 to present faxed to [facility] Phone: [phone] Fax: [phone]

## 2016-06-13 ENCOUNTER — Encounter: Payer: Self-pay | Admitting: Obstetrics & Gynecology

## 2016-06-23 ENCOUNTER — Ambulatory Visit (INDEPENDENT_AMBULATORY_CARE_PROVIDER_SITE_OTHER): Payer: BLUE CROSS/BLUE SHIELD | Admitting: Obstetrics & Gynecology

## 2016-06-23 ENCOUNTER — Encounter: Payer: Self-pay | Admitting: Obstetrics & Gynecology

## 2016-06-23 ENCOUNTER — Other Ambulatory Visit: Payer: Self-pay | Admitting: Obstetrics & Gynecology

## 2016-06-23 VITALS — BP 118/80 | HR 72 | Resp 16 | Ht 63.25 in | Wt 157.0 lb

## 2016-06-23 DIAGNOSIS — Z124 Encounter for screening for malignant neoplasm of cervix: Secondary | ICD-10-CM

## 2016-06-23 DIAGNOSIS — Z0000 Encounter for general adult medical examination without abnormal findings: Secondary | ICD-10-CM

## 2016-06-23 DIAGNOSIS — Z1231 Encounter for screening mammogram for malignant neoplasm of breast: Secondary | ICD-10-CM

## 2016-06-23 DIAGNOSIS — Z01419 Encounter for gynecological examination (general) (routine) without abnormal findings: Secondary | ICD-10-CM | POA: Diagnosis not present

## 2016-06-23 NOTE — Progress Notes (Signed)
60 y.o. G2P2 MarriedCaucasianF here for annual exam.  Doing well.  Had BMD at [facility] with a study she is doing.  T score was much better and just in ostopenia range.  She has already showing this to Dr. Forde Dandy.  He wants her to have it repeated in a year and not do this year.    Had appt with Dr. Forde Dandy recently.  Triglycerides were <150.  Stopped her Zetia after her lab work was so good last year.  Triglycerides were in the 100's range at that time.  So, she is going to have this rechecked.  Will have Hep C testing done when she does this.    Reports caliber of stool has changed per pt.  No blood in stool.  Taking probiotic and this helps.  Had colonoscopy 2014 with Dr. Collene Mares.  Will monitor.  Patient's last menstrual period was 06/26/2004.          Sexually active: Yes.    The current method of family planning is vasectomy.    Exercising: Yes.    pilates, walk, bike Smoker:  no  Health Maintenance: Pap:  04-09-15 neg History of abnormal Pap:  Yes ASCUS, neg HPV HR 2013 MMG:  07-24-15 category b density birads 1:neg Colonoscopy:  2014 f/u 32yrs Dr Collene Mares BMD:   02-27-16 TDaP:  2017  Pneumonia vaccine(s): no Zostavax: 5/12 Hep C testing: not done Screening Labs: pcp, Hb today: not done Urine today: not done Self breast exam: not done   reports that she has never smoked. She has never used smokeless tobacco. She reports that she drinks about 1.2 - 1.8 oz of alcohol per week . She reports that she does not use drugs.  Past Medical History:  Diagnosis Date  . Avascular necrosis of bone (HCC)    Left talus  . Blood transfusion   . Dysplastic nevus 1988   right hip  . GERD (gastroesophageal reflux disease)   . History of breast cancer 2005   invasive ductal cancer left breast  . Hypertension   . Hypertriglyceridemia   . Irritable bowel syndrome   . Metabolic syndrome    resolved with diet and exercise  . Migraines   . Nocturnal leg cramps 02/14/2015  . Osteopenia   . Palpitations 12/13    normal echo  . Peripheral neuropathy ([place])   . Plantar fasciitis 6/04  . Tongue dysplasia   . Ulcer     Past Surgical History:  Procedure Laterality Date  . ANKLE SURGERY  07/2007   left, revascularization  . BREAST LUMPECTOMY  06/2004   left, with port placement  . [place]   x2  . COLONOSCOPY  09/2013   Dr. Collene Mares  . LAPAROSCOPIC CHOLECYSTECTOMY  2003  . MOUTH SURGERY  11/15   implant/crown placed 4/16  . SHOULDER ARTHROSCOPY  10/2005   right  . SHOULDER SURGERY  12/15   left  . TONGUE SURGERY  2012, 2013  . WRIST SURGERY  1988   right, DeQuervains    Current Outpatient Prescriptions  Medication Sig Dispense Refill  . clonazePAM (KLONOPIN) 1 MG tablet Take 1 tablet (1 mg total) by mouth at bedtime. Take 1/2 tablet by mouth at bedtime (Patient taking differently: Take 0.5 mg by mouth at bedtime as needed. ) 90 tablet 1  . Estradiol 10 MCG TABS vaginal tablet Place 1 tablet (10 mcg total) vaginally 2 (two) times a week. (Patient taking differently: Place 10 mcg vaginally once a  week. ) 24 tablet 4  . fluconazole (DIFLUCAN) 150 MG tablet Take 1 tablet po q 72 hours prn while taking abx. Take 1 tablet po after completion of abx prn. 6 tablet 0  . Multiple Vitamins-Minerals (MULTIVITAMIN PO) Take 1 tablet by mouth daily. With calcium    . nebivolol (BYSTOLIC) 10 MG tablet Take 10 mg by mouth daily.    . NONFORMULARY OR COMPOUNDED ITEM Place 1.25 mLs onto the skin 3 (three) times a week. weekly    . Omega-3-acid Ethyl Esters (LOVAZA PO) Take by mouth. 2G twice daily    . Probiotic Product (ALIGN PO) Take 1 tablet by mouth daily.     No current facility-administered medications for this visit.     Family History  Problem Relation Age of Onset  . Asthma Brother   . Asthma Sister   . Cancer Mother     melanoma  . Cancer Father     colon  . Prostate cancer Father     ROS:  Pertinent items are noted in HPI.  Otherwise, a comprehensive ROS was  negative.  Exam:   Vitals:   06/23/16 1037  BP: 118/80  Pulse: 72  Resp: 16  Weight: 157 lb (71.2 kg)  Height: 5' 3.25" (1.607 m)    General appearance: alert, cooperative and appears stated age Head: Normocephalic, without obvious abnormality, atraumatic Neck: no adenopathy, supple, symmetrical, trachea midline and thyroid normal to inspection and palpation Lungs: clear to auscultation bilaterally Breasts: right breast without masses, skin changes, LAD.  Hard area at proximal end of breast lumpectomy scar.  Unchanged.  No new masses or LAD.  Well healed axillary scar. Heart: regular rate and rhythm Abdomen: soft, non-tender; bowel sounds normal; no masses,  no organomegaly Extremities: extremities normal, atraumatic, no cyanosis or edema Skin: Skin color, texture, turgor normal. No rashes or lesions Lymph nodes: Cervical, supraclavicular, and axillary nodes normal. No abnormal inguinal nodes palpated Neurologic: Grossly normal   Pelvic: External genitalia: small area of thickened white tissue just to pt's left of midline of introitus.  Stable from last year.              Urethra:  normal appearing urethra with no masses, tenderness or lesions              Bartholins and Skenes: normal                 Vagina: normal appearing vagina with normal color and discharge, no lesions              Cervix: no lesions              Pap taken: yes Bimanual Exam:  Uterus:  normal size, contour, position, consistency, mobility, non-tender              Adnexa: normal adnexa and no mass, fullness, tenderness               Rectovaginal: Confirms               Anus:  normal sphincter tone, no lesions  Chaperone was present for exam.  A: Well Woman with normal exam  PMP, no HRT H/O breast cancer 9/05 s/p lumpectomy, sentinel node biopsy, s/p chemotherapy and radiation, s/p tamoxifen.  Completed 10 years of therapy on Tamoxifen. H/o metabolic syndrome with triglyceridemia   H/o labile blood  pressure.  On beta blocker, being followed by cardiology  Chronic insomnia. Used Ambien in the past but now  off.   Chemotherapy induced neuropathy.  Followed by Dr. Jannifer Franklin. Vaginal atrophy  Thickened, white vulvar skin changes. Bx showed benign mucosa with hyperplasia and hyperkeratosis  Osteopenia. Last BMD 02/27/16 with improvement.  Done with [place] study.  Report will be scanned into EPIC. H/O elevated LFTs.  Being followed by Dr. Forde Dandy.  P: 3D Mammogram yearly.  MRI was not approved last year as she was past the 10 year mark.  Last MRI 9/14.  Pap smear and HR HPV today. ASCUS with neg HR HPV 2013  Sees Dr. Forde Dandy every six months  Clonazepam 1mg  qhs daily.No rx needed at this time.. Vagifem 37meq pv once a week or less.  Does not need rx at this time.   Will have Hep c testing done with Dr. Forde Dandy with next lab draw. Return annually or prn

## 2016-06-25 LAB — IPS PAP TEST WITH HPV

## 2016-07-03 ENCOUNTER — Ambulatory Visit: Payer: BLUE CROSS/BLUE SHIELD | Admitting: Obstetrics & Gynecology

## 2016-07-27 ENCOUNTER — Ambulatory Visit: Payer: BLUE CROSS/BLUE SHIELD

## 2016-08-03 ENCOUNTER — Ambulatory Visit
Admission: RE | Admit: 2016-08-03 | Discharge: 2016-08-03 | Disposition: A | Discharge status: Home / Self Care | Payer: BLUE CROSS/BLUE SHIELD | Source: Ambulatory Visit | Attending: Obstetrics & Gynecology | Admitting: Obstetrics & Gynecology

## 2016-08-03 DIAGNOSIS — Z1231 Encounter for screening mammogram for malignant neoplasm of breast: Secondary | ICD-10-CM

## 2016-08-06 ENCOUNTER — Telehealth: Payer: Self-pay

## 2016-08-06 NOTE — Telephone Encounter (Signed)
Received form from [employer] disability which was completed and signed by Dr. Jannifer Franklin. Current limitations still include gait imbalance and chronic pain. Returned to medical records for pt payment and pick-up.

## 2016-08-11 ENCOUNTER — Telehealth: Payer: Self-pay | Admitting: *Deleted

## 2016-08-11 NOTE — Telephone Encounter (Signed)
Pt [employer] form @ the front desk for pickup. Pt need to pay fee for the form.

## 2016-08-12 ENCOUNTER — Encounter: Payer: Self-pay | Admitting: Neurology

## 2016-08-12 DIAGNOSIS — Z0289 Encounter for other administrative examinations: Secondary | ICD-10-CM

## 2016-10-22 ENCOUNTER — Encounter: Payer: Self-pay | Admitting: Obstetrics & Gynecology

## 2016-10-22 ENCOUNTER — Telehealth: Payer: Self-pay

## 2016-10-22 ENCOUNTER — Other Ambulatory Visit: Payer: Self-pay | Admitting: Obstetrics & Gynecology

## 2016-10-22 MED ORDER — CLONAZEPAM 1 MG PO TABS: 1.0000 mg | ORAL_TABLET | Freq: Every evening | ORAL | 1 refills | Status: DC | PRN

## 2016-10-22 NOTE — Telephone Encounter (Signed)
Rx printed and is on your desk.  Can you please let her know when this is done.  Thanks.

## 2016-10-22 NOTE — Telephone Encounter (Signed)
Non-Urgent Medical Question  Message [id number]  From EDISON WOLLSCHLAGER To Megan Salon, MD Sent 10/22/2016 2:39 PM  Hi. I have reached my enormous insurance deductible for the year and am trying to refill all Rx. Could Dr. Sabra Heck please refill my Clonazepam 1 mg. #90 1 po qhs to [facility] Many thanks and Happy New Year! Margaretha Glassing   Responsible Party   Pool - [facility] Clinical Pool No one has taken responsibility for this message.  No actions have been taken on this message.   Last refill of Clonazepam 1 mg #90 1RF was given on 01/30/2016. Routing to [place] for review and advise.

## 2016-10-22 NOTE — Telephone Encounter (Signed)
Telephone encounter created to review with Dr.Miller.

## 2016-10-23 NOTE — Telephone Encounter (Signed)
Megan Salon, MD  to [name]     10/22/16 6:06 PM  Jeani Hawking,  This was faxed this evening so hopefully there will be no issues getting it filled before the end of the year. Let me know if there are any problems.   Vinnie Level    Last read by [name] at 6:08 PM on 10/22/2016.    Routing to provider for final review. Patient agreeable to disposition. Will close encounter.

## 2017-02-02 ENCOUNTER — Other Ambulatory Visit: Payer: Self-pay | Admitting: Oncology

## 2017-02-16 ENCOUNTER — Encounter: Payer: Self-pay | Admitting: Neurology

## 2017-02-16 ENCOUNTER — Ambulatory Visit (INDEPENDENT_AMBULATORY_CARE_PROVIDER_SITE_OTHER): Payer: BLUE CROSS/BLUE SHIELD | Admitting: Neurology

## 2017-02-16 VITALS — BP 134/83 | HR 63 | Ht 63.25 in | Wt 159.5 lb

## 2017-02-16 DIAGNOSIS — G620 Drug-induced polyneuropathy: Secondary | ICD-10-CM

## 2017-02-16 DIAGNOSIS — G4762 Sleep related leg cramps: Secondary | ICD-10-CM

## 2017-02-16 NOTE — Progress Notes (Signed)
Reason for visit:  Peripheral neuropathy  Jessica Mack is an 61 y.o. female  History of present illness:  Jessica Mack is a 61 year old right-handed white female with a history of breast cancer and a chemotherapy-induced peripheral neuropathy. The patient has had some mild gait instability, she reports no falls. 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 She does have some discomfort in the hands and feet but she has not wished to stay on any medications for discomfort. The patient is in general sleeping fairly well, occasionally she may wake up at 4 or 5 in the morning with some paresthesias. She does not require a cane for ambulation. Overall, she has been relative stable from last year.   Past Medical History:  Diagnosis Date  . Avascular necrosis of bone (HCC)    Left talus  . Blood transfusion   . Dysplastic nevus 1988   right hip  . GERD (gastroesophageal reflux disease)   . History of breast cancer 2005   invasive ductal cancer left breast  . Hypertension   . Hypertriglyceridemia   . Irritable bowel syndrome   . Metabolic syndrome    resolved with diet and exercise  . Migraines   . Nocturnal leg cramps 02/14/2015  . Osteopenia   . Palpitations 12/13   normal echo  . Peripheral neuropathy   . Plantar fasciitis 6/04  . Tongue dysplasia   . Ulcer     Past Surgical History:  Procedure Laterality Date  . ANKLE SURGERY  07/2007   left, revascularization  . BREAST LUMPECTOMY  06/2004   left, with port placement  . [place]   x2  . COLONOSCOPY  09/2013   Dr. Collene Mares  . LAPAROSCOPIC CHOLECYSTECTOMY  2003  . MOUTH SURGERY  11/15   implant/crown placed 4/16  . SHOULDER ARTHROSCOPY  10/2005   right  . SHOULDER SURGERY  12/15   left  . TONGUE SURGERY  2012, 2013  . WRIST SURGERY  1988   right, DeQuervains    Family History  Problem Relation Age of Onset  . Cancer Mother     melanoma  . Cancer Father     colon  . Prostate cancer Father     . Asthma Brother   . Asthma Sister     Social history:  reports that she has never smoked. She has never used smokeless tobacco. She reports that she drinks about 4.2 oz of alcohol per week . She reports that she does not use drugs.    Allergies  Allergen Reactions  . Augmentin [Amoxicillin-Pot Clavulanate]     abd cramping  . Cephalexin     Profuse diarrhea  . Crestor [Rosuvastatin]     Liver function elevated     Medications:  Prior to Admission medications   Medication Sig Start Date End Date Taking? Authorizing Provider  clonazePAM (KLONOPIN) 1 MG tablet Take 1 tablet (1 mg total) by mouth at bedtime as needed for anxiety. Patient taking differently: Take 0.5 mg by mouth at bedtime as needed for anxiety.  10/22/16  Yes Megan Salon, MD  Estradiol 10 MCG TABS vaginal tablet Place 1 tablet (10 mcg total) vaginally 2 (two) times a week. Patient taking differently: Place 10 mcg vaginally once a week.  10/25/15  Yes Megan Salon, MD  Multiple Vitamins-Minerals (MULTIVITAMIN PO) Take 1 tablet by mouth daily. With calcium   Yes Historical Provider, MD  nebivolol (BYSTOLIC) 10 MG tablet Take 10  mg by mouth daily.   Yes Historical Provider, MD  Omega-3-acid Ethyl Esters (LOVAZA PO) Take by mouth. 2G twice daily   Yes Historical Provider, MD  Probiotic Product (ALIGN PO) Take 1 tablet by mouth daily.   Yes Historical Provider, MD    ROS:  Out of a complete 14 system review of symptoms, the patient complains only of the following symptoms, and all other reviewed systems are negative.  Frequent waking, snoring Numbness  Blood pressure 134/83, pulse 63, height 5' 3.25" (1.607 m), weight 159 lb 8 oz (72.3 kg), last menstrual period 06/26/2004.  Physical Exam  General: The patient is alert and cooperative at the time of the examination. The patient is moderately obese.  Skin: No significant peripheral edema is noted.   Neurologic Exam  Mental status: The patient is alert and  oriented x 3 at the time of the examination. The patient has apparent normal recent and remote memory, with an apparently normal attention span and concentration ability.   Cranial nerves: Facial symmetry is present. Speech is normal, no aphasia or dysarthria is noted. Extraocular movements are full. Visual fields are full.  Motor: The patient has good strength in all 4 extremities.  Sensory examination: Soft touch sensation is symmetric on the face, arms, and legs.  Coordination: The patient has good finger-nose-finger and heel-to-shin bilaterally.  Gait and station: The patient has a normal gait. Tandem gait is slightly unsteady. Romberg is negative. No drift is seen.  Reflexes: Deep tendon reflexes are symmetric, but are depressed.   Assessment/Plan:  1. Peripheral neuropathy  The patient is doing relatively well at this time. We will continue to follow her on an annual basis. The patient is on disability for her neuropathy.  Jill Alexanders MD 02/16/2017 4:13 PM  [facility] [address] [place], [place] [address]  Phone [phone] Fax [phone]

## 2017-03-16 ENCOUNTER — Telehealth: Payer: Self-pay

## 2017-03-16 ENCOUNTER — Other Ambulatory Visit: Payer: Self-pay | Admitting: Obstetrics & Gynecology

## 2017-03-16 ENCOUNTER — Encounter: Payer: Self-pay | Admitting: Obstetrics & Gynecology

## 2017-03-16 MED ORDER — PROMETHAZINE HCL 12.5 MG PO TABS: 12.5000 mg | ORAL_TABLET | ORAL | 0 refills | Status: DC | PRN

## 2017-03-16 MED ORDER — SCOPOLAMINE 1 MG/3DAYS TD PT72: 1.0000 | MEDICATED_PATCH | TRANSDERMAL | 1 refills | Status: DC

## 2017-03-16 NOTE — Telephone Encounter (Signed)
Called pt.  Scopolamine patches rx sent to [facility] as well as Phenergan rx for 12.5mg  every six hours as needed.  OTC Dramamine/Bonine/meclizine reviewed.  Pressure point bracelets for nausea also suggested.  Pt requested this be sent to [place].  Wished a great trip.  Ok to close encounter.

## 2017-03-16 NOTE — Telephone Encounter (Signed)
Telephone encounter created to review with Dr.Miller. 

## 2017-03-16 NOTE — Telephone Encounter (Signed)
Non-Urgent Medical Question  Message [id number]  From TKEYA STENCIL To Megan Salon, MD Sent 03/16/2017 1:01 PM  HI! I am leaving Monday for a 2 week cruise around [place] and thinking it might be good to have a couple of scop patches and maybe some Phenergan. Wondering if Dr. Sabra Heck could help me out and any other suggestions for non-drowsy stuff (Bonine??) Thanks so much. Call/e-mail if any questions. Margaretha Glassing, MD   Responsible Party   Pool - [facility] Clinical Pool Message taken by Gwendlyn Deutscher, RN on 03/16/2017 1:42 PM  No actions have been taken on this message.   Routing to [place] for review.

## 2017-05-11 DIAGNOSIS — M19049 Primary osteoarthritis, unspecified hand: Secondary | ICD-10-CM | POA: Insufficient documentation

## 2017-05-12 ENCOUNTER — Ambulatory Visit (INDEPENDENT_AMBULATORY_CARE_PROVIDER_SITE_OTHER): Payer: BLUE CROSS/BLUE SHIELD | Admitting: Orthopedic Surgery

## 2017-05-25 ENCOUNTER — Telehealth: Payer: Self-pay | Admitting: Obstetrics & Gynecology

## 2017-05-25 ENCOUNTER — Other Ambulatory Visit: Payer: Self-pay | Admitting: Obstetrics & Gynecology

## 2017-05-25 ENCOUNTER — Ambulatory Visit (INDEPENDENT_AMBULATORY_CARE_PROVIDER_SITE_OTHER): Payer: PRIVATE HEALTH INSURANCE | Admitting: Obstetrics & Gynecology

## 2017-05-25 VITALS — BP 130/88 | HR 76 | Resp 14 | Ht 63.5 in | Wt 159.0 lb

## 2017-05-25 DIAGNOSIS — N6320 Unspecified lump in the left breast, unspecified quadrant: Secondary | ICD-10-CM

## 2017-05-25 DIAGNOSIS — Z659 Problem related to unspecified psychosocial circumstances: Secondary | ICD-10-CM | POA: Diagnosis not present

## 2017-05-25 NOTE — Progress Notes (Signed)
Spoke with Roselyn Reef at [facility]. Patient scheduled while in office for left breast diagnostic MMG and [place] on 05/28/17 arriving at 1pm for 1:20pm appointment. Patient verbalizes understanding and is agreeable.

## 2017-05-25 NOTE — Telephone Encounter (Signed)
Returned call to patient. Patient scheduled for Tuesday 05/25/17 at 1430. Patient agreeable to date and time of appointment.   Routing to provider for final review. Patient agreeable to disposition. Will close encounter.

## 2017-05-25 NOTE — Telephone Encounter (Signed)
Call to patient. Patient states that she is "understandably anxious" due to left breast feeling "lumpier than normal." Patient states it is not warm to touch, nor is it red. She states she noticed it in the shower, but is unable to consistently feel it due to her neuropathy. RN advised office visit recommended for further evaluation. Patient agreeable. Patient would like to be seen by Dr. Sabra Heck, as she is the one who primarily does her breast checks, and "knows what it feels like." RN advised she would need to review Dr. Ammie Ferrier schedule with Dr. Sabra Heck and clinical nurse supervisor and return call. Patient agreeable.   Routing to provider for review.

## 2017-05-25 NOTE — Telephone Encounter (Signed)
Patient called requesting an appointment with Dr. Sabra Heck. She said she's found a possible lump in her left breast area where there is scaring from surgery for breast cancer.  Last seen: 06/23/16

## 2017-05-27 ENCOUNTER — Encounter: Payer: Self-pay | Admitting: Obstetrics & Gynecology

## 2017-05-27 NOTE — Progress Notes (Signed)
Patient ID: Jessica Mack, female   DOB: 10/31/1955, 61 y.o.   MRN: [id number]  GYNECOLOGY  VISIT   HPI: 61 y.o. G2P2 Married Caucasian female here for complaint of increased firmness at breast scar from breast cancer treatment.  Has noticed in the shower and decided she should have this examined.  She does have neruopathy in her fingers so is hard for her to examine her breasts.  Denies breast tenderness.  Denies nipple discharge.  Cannot palpate a lump, per se, but it feels different.    Feels like the needs some suggestions for therapists as daughter is moving to [place] for a year to teach ESL classes.  Daughter with psychological issues.  Pt is nervous about her being away.  She's had to be hospitalized on three separate occasions.  Wishes she would have made a move that wasn't [place] the way [place] [facility] [place].  Daughter did finish master's degree.  Son doing very well.  Finished engineering degree.  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  He is doing really well.  Husband re-intered the ministry and has a new church "[employer]".  Lots of transitions.  Just feels she could use someone to help with with coping skills and boundary setting.  Suggestions disucssed.  GYNECOLOGIC HISTORY: Patient's last menstrual period was 06/26/2004. Contraception: PMP Menopausal hormone therapy: none  Patient Active Problem List   Diagnosis Date Noted  . Nocturnal leg cramps 02/14/2015  . Osteopenia 08/05/2014  . Hyperlipidemia 07/25/2013  . Osteoporosis 07/25/2013  . Palpitations 07/25/2013  . Breast cancer of upper-outer quadrant of left female breast ([place]) 07/20/2013  . Breast cancer screening, high risk patient 05/30/2013  . Hyperacusis 07/26/2012  . Leukoplakia of oral mucosa, including tongue 07/26/2012  . Essential and other specified forms of tremor 07/26/2012  . Drug-induced polyneuropathy ([place]) 07/26/2012  . 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[place]) 07/26/2012     Past Medical History:  Diagnosis Date  . Avascular necrosis of bone (HCC)    Left talus  . Blood transfusion   . Dysplastic nevus 1988   right hip  . GERD (gastroesophageal reflux disease)   . History of breast cancer 2005   invasive ductal cancer left breast  . Hypertension   . Hypertriglyceridemia   . Irritable bowel syndrome   . Metabolic syndrome    resolved with diet and exercise  . Migraines   . Nocturnal leg cramps 02/14/2015  . Osteopenia   . Palpitations 12/13   normal echo  . Peripheral neuropathy   . Plantar fasciitis 6/04  . Tongue dysplasia   . Ulcer     Past Surgical History:  Procedure Laterality Date  . ANKLE SURGERY  07/2007   left, revascularization  . BREAST LUMPECTOMY  06/2004   left, with port placement  . [place]   x2  . COLONOSCOPY  09/2013   Dr. Collene Mares  . LAPAROSCOPIC CHOLECYSTECTOMY  2003  . MOUTH SURGERY  11/15   implant/crown placed 4/16  . SHOULDER ARTHROSCOPY  10/2005   right  . SHOULDER SURGERY  12/15   left  . TONGUE SURGERY  2012, 2013  . WRIST SURGERY  1988   right, DeQuervains    MEDS:   Current Outpatient Prescriptions on File Prior to Visit  Medication Sig Dispense Refill  . clonazePAM (KLONOPIN) 1 MG tablet Take 1 tablet (1 mg total) by mouth at bedtime as needed for anxiety. (Patient taking differently:  Take 0.5 mg by mouth at bedtime as needed for anxiety. ) 90 tablet 1  . Estradiol 10 MCG TABS vaginal tablet Place 1 tablet (10 mcg total) vaginally 2 (two) times a week. (Patient taking differently: Place 10 mcg vaginally once a week. ) 24 tablet 4  . Multiple Vitamins-Minerals (MULTIVITAMIN PO) Take 1 tablet by mouth daily. With calcium    . nebivolol (BYSTOLIC) 10 MG tablet Take 10 mg by mouth daily.    . Omega-3-acid Ethyl Esters (LOVAZA PO) Take by mouth. 2G twice daily    . Probiotic Product (ALIGN PO) Take 1 tablet by mouth daily.     No current facility-administered medications on file prior  to visit.      ALLERGIES: Augmentin [amoxicillin-pot clavulanate]; Cephalexin; and Crestor [rosuvastatin]  Family History  Problem Relation Age of Onset  . Cancer Mother        melanoma  . Cancer Father        colon  . Prostate cancer Father   . Asthma Brother   . Asthma Sister     SH:  Married, non smoker  Review of Systems  Constitutional: Negative.   Respiratory: Negative.   Cardiovascular: Negative.   Musculoskeletal: Negative.     PHYSICAL EXAMINATION:    BP 130/88 (BP Location: Right Arm, Patient Position: Sitting, Cuff Size: Normal)   Pulse 76   Resp 14   Ht 5' 3.5" (1.613 m)   Wt 159 lb (72.1 kg)   LMP 06/26/2004   BMI 27.72 kg/m     Physical Exam  Constitutional: She appears well-developed and well-nourished.  Neck: Normal range of motion. Neck supple. No thyromegaly present.  Pulmonary/Chest: Right breast exhibits no inverted nipple, no mass, no nipple discharge, no skin change and no tenderness. Left breast exhibits mass (firmness) as noted in picture. Left breast exhibits no inverted nipple, no nipple discharge, no skin change and no tenderness. Breasts are symmetrical.    Lymphadenopathy:    She has no cervical adenopathy.    She has no axillary adenopathy.       Right: No supraclavicular adenopathy present.       Left: No supraclavicular adenopathy present.   Assessment: Increased prominence of lumpectomy scar on medical edge H/O breast cancer 9/05 s/p lumpectomy, SNB, s/p chemo and radiation s/p Tamoxifen.  Completed 10 years on Tamoxifen. Social stressors  Plan: Diagnosed left MMG scheduled.  Pt has new insurance so would like to do right screening at same time if possible Options for therapy given   ~30 minutes spent with patient >50% of time was in face to face discussion of above.

## 2017-05-28 ENCOUNTER — Ambulatory Visit
Admission: RE | Admit: 2017-05-28 | Discharge: 2017-05-28 | Disposition: A | Discharge status: Home / Self Care | Payer: PRIVATE HEALTH INSURANCE | Source: Ambulatory Visit | Attending: Obstetrics & Gynecology | Admitting: Obstetrics & Gynecology

## 2017-05-28 ENCOUNTER — Ambulatory Visit
Admission: RE | Admit: 2017-05-28 | Discharge: 2017-05-28 | Disposition: A | Discharge status: Home / Self Care | Payer: BLUE CROSS/BLUE SHIELD | Source: Ambulatory Visit | Attending: Obstetrics & Gynecology | Admitting: Obstetrics & Gynecology

## 2017-05-28 DIAGNOSIS — N6320 Unspecified lump in the left breast, unspecified quadrant: Secondary | ICD-10-CM

## 2017-06-24 ENCOUNTER — Other Ambulatory Visit (HOSPITAL_COMMUNITY)
Admission: RE | Admit: 2017-06-24 | Discharge: 2017-06-24 | Disposition: A | Discharge status: Home / Self Care | Payer: PRIVATE HEALTH INSURANCE | Source: Ambulatory Visit | Attending: Obstetrics & Gynecology | Admitting: Obstetrics & Gynecology

## 2017-06-24 ENCOUNTER — Ambulatory Visit (INDEPENDENT_AMBULATORY_CARE_PROVIDER_SITE_OTHER): Payer: PRIVATE HEALTH INSURANCE | Admitting: Obstetrics & Gynecology

## 2017-06-24 ENCOUNTER — Encounter: Payer: Self-pay | Admitting: Obstetrics & Gynecology

## 2017-06-24 VITALS — BP 128/84 | HR 72 | Resp 14 | Ht 63.25 in | Wt 164.0 lb

## 2017-06-24 DIAGNOSIS — M8588 Other specified disorders of bone density and structure, other site: Secondary | ICD-10-CM

## 2017-06-24 DIAGNOSIS — Z124 Encounter for screening for malignant neoplasm of cervix: Secondary | ICD-10-CM | POA: Diagnosis not present

## 2017-06-24 DIAGNOSIS — Z01419 Encounter for gynecological examination (general) (routine) without abnormal findings: Secondary | ICD-10-CM

## 2017-06-24 MED ORDER — CLONAZEPAM 1 MG PO TABS: 1.0000 mg | ORAL_TABLET | Freq: Every evening | ORAL | 1 refills | Status: DC | PRN

## 2017-06-24 NOTE — Progress Notes (Signed)
61 y.o. G2P2 Married Caucasian F here for annual exam.  Doing well.    Feeling some anxiety.  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  Feeling a little over committed.  Daughter left for year in [place].  We have discussed therapy options for her.    Denies vaginal bleeding.    PCP:  Dr. Forde Dandy.  Has some mild liver function test elevation.  Follow up has been normal.    Patient's last menstrual period was 06/26/2004.          Sexually active: Yes.    The current method of family planning is post menopausal status.    Exercising: Yes.    pilates, walking Smoker:  no  Health Maintenance: Pap:  06/23/16 negative, HR HPV negative, 04/09/15 negative, 03/30/14 negative, 03/17/13 negative History of abnormal Pap:  yes MMG:  05/28/17 [place] left breast: BIRADS 2 benign  Colonoscopy: 2014 with Dr. Collene Mares- repeat 5 years BMD:   02/27/16  TDaP:  12/12/15  Pneumonia vaccine(s):  never Zostavax:   02/2011  Hep C testing: has donated blood in the past- 2005 last time Screening Labs: Does through work, Hb today: same   reports that she has never smoked. She has never used smokeless tobacco. She reports that she drinks about 4.2 oz of alcohol per week . She reports that she does not use drugs.  Past Medical History:  Diagnosis Date  . Avascular necrosis of bone (HCC)    Left talus  . Blood transfusion   . Dysplastic nevus 1988   right hip  . GERD (gastroesophageal reflux disease)   . History of breast cancer 2005   invasive ductal cancer left breast  . Hypertension   . Hypertriglyceridemia   . Irritable bowel syndrome   . Metabolic syndrome    resolved with diet and exercise  . Migraines   . Nocturnal leg cramps 02/14/2015  . Osteopenia   . Palpitations 12/13   normal echo  . Peripheral neuropathy   . Plantar fasciitis 6/04  . Tongue dysplasia   . Ulcer     Past Surgical History:  Procedure Laterality Date  . ANKLE SURGERY  07/2007   left,  revascularization  . BREAST LUMPECTOMY  06/2004   left, with port placement  . [place]   x2  . COLONOSCOPY  09/2013   Dr. Collene Mares  . LAPAROSCOPIC CHOLECYSTECTOMY  2003  . MOUTH SURGERY  11/15   implant/crown placed 4/16  . SHOULDER ARTHROSCOPY  10/2005   right  . SHOULDER SURGERY  12/15   left  . TONGUE SURGERY  2012, 2013  . WRIST SURGERY  1988   right, DeQuervains    Current Outpatient Prescriptions  Medication Sig Dispense Refill  . clonazePAM (KLONOPIN) 1 MG tablet Take 1 tablet (1 mg total) by mouth at bedtime as needed for anxiety. (Patient taking differently: Take 0.5 mg by mouth at bedtime as needed for anxiety. ) 90 tablet 1  . Estradiol 10 MCG TABS vaginal tablet Place 1 tablet (10 mcg total) vaginally 2 (two) times a week. (Patient taking differently: Place 10 mcg vaginally once a week. ) 24 tablet 4  . Multiple Vitamins-Minerals (MULTIVITAMIN PO) Take 1 tablet by mouth daily. With calcium    . nebivolol (BYSTOLIC) 10 MG tablet Take 10 mg by mouth daily.    . Omega-3-acid Ethyl Esters (LOVAZA PO) Take by mouth. 2G twice daily    .  Probiotic Product (ALIGN PO) Take 1 tablet by mouth daily.     No current facility-administered medications for this visit.     Family History  Problem Relation Age of Onset  . Cancer Mother        melanoma  . Cancer Father        colon  . Prostate cancer Father   . Asthma Brother   . Asthma Sister     ROS:  Pertinent items are noted in HPI.  Otherwise, a comprehensive ROS was negative.  Exam:   BP 128/84 (BP Location: Right Arm, Patient Position: Sitting, Cuff Size: Normal)   Pulse 72   Resp 14   Ht 5' 3.25" (1.607 m)   Wt 164 lb (74.4 kg)   LMP 06/26/2004   BMI 28.82 kg/m   Weight change: +7#  Height: 5' 3.25" (160.7 cm)  Ht Readings from Last 3 Encounters:  06/24/17 5' 3.25" (1.607 m)  05/25/17 5' 3.5" (1.613 m)  02/16/17 5' 3.25" (1.607 m)    General appearance: alert, cooperative and appears stated  age Head: Normocephalic, without obvious abnormality, atraumatic Neck: no adenopathy, supple, symmetrical, trachea midline and thyroid normal to inspection and palpation Lungs: clear to auscultation bilaterally Breasts: left breast s/p lumpectomy with thickening along scar recenctly imaged as calcifications, no other skin changes, masses, LAD, nipple discharge on either breast Heart: regular rate and rhythm Abdomen: soft, non-tender; bowel sounds normal; no masses,  no organomegaly Extremities: extremities normal, atraumatic, no cyanosis or edema Skin: Skin color, texture, turgor normal. No rashes or lesions Lymph nodes: Cervical, supraclavicular, and axillary nodes normal. No abnormal inguinal nodes palpated Neurologic: Grossly normal   Pelvic: External genitalia:  Area of white, thickened tissue on perineum, smaller amt that previously noted (biopsied x 2             Urethra:  normal appearing urethra with no masses, tenderness or lesions              Bartholins and Skenes: normal                 Vagina: normal appearing vagina with normal color and discharge, no lesions              Cervix: no lesions              Pap taken: Yes.   Bimanual Exam:  Uterus:  normal size, contour, position, consistency, mobility, non-tender              Adnexa: normal adnexa and no mass, fullness, tenderness               Rectovaginal: Confirms               Anus:  normal sphincter tone, no lesions  Chaperone was present for exam.  A:  Well Woman with normal exam PMP, no HRT H/o breast cancer 9/05 s/p left lumpectomy, SNB, s/p chemo and radiation, tamoxifen x 10 years.   H/o metabolic syndrome with elevated triglycerides Osteopenia Chemotherapy induced neuropathy.  Followed by Dr. Jannifer Franklin. Vaginal atrophy Vulvar skin changes with bx showing benign findings H/o fluctuating LFTs, followed by Dr. Forde Dandy  P:   Mammogram yearly.  Pt just had bilateral diagnostic. pap smear with HR HPV obtained  today Will plan BMD this year.   No rx for vagifem needed Lab work due.  Pt states she will schedule with Dr. Forde Dandy. Rx for clonazepam 1mg  qhs prn.  Often uses 1/2 tab now.  Rx done for six months. return annually or prn

## 2017-06-25 LAB — CYTOLOGY - PAP
Adequacy: ABSENT
Diagnosis: NEGATIVE
HPV: NOT DETECTED

## 2017-08-03 ENCOUNTER — Other Ambulatory Visit: Payer: Self-pay | Admitting: Obstetrics & Gynecology

## 2017-08-03 DIAGNOSIS — Z1231 Encounter for screening mammogram for malignant neoplasm of breast: Secondary | ICD-10-CM

## 2017-08-26 ENCOUNTER — Ambulatory Visit
Admission: RE | Admit: 2017-08-26 | Discharge: 2017-08-26 | Disposition: A | Discharge status: Home / Self Care | Payer: PRIVATE HEALTH INSURANCE | Source: Ambulatory Visit | Attending: Obstetrics & Gynecology | Admitting: Obstetrics & Gynecology

## 2017-08-26 DIAGNOSIS — M8588 Other specified disorders of bone density and structure, other site: Secondary | ICD-10-CM

## 2017-08-26 DIAGNOSIS — Z1231 Encounter for screening mammogram for malignant neoplasm of breast: Secondary | ICD-10-CM

## 2017-08-26 HISTORY — DX: Personal history of irradiation: Z92.3

## 2017-11-04 ENCOUNTER — Other Ambulatory Visit: Payer: Self-pay | Admitting: Dermatology

## 2018-02-21 ENCOUNTER — Ambulatory Visit: Payer: BLUE CROSS/BLUE SHIELD | Admitting: Neurology

## 2018-04-04 ENCOUNTER — Other Ambulatory Visit: Payer: Self-pay | Admitting: Obstetrics & Gynecology

## 2018-04-04 ENCOUNTER — Encounter: Payer: Self-pay | Admitting: Obstetrics & Gynecology

## 2018-04-04 NOTE — Telephone Encounter (Signed)
Medication refill request: clonazepam  Last AEX:  06/24/17 SM  Next AEX: 06/30/18  Last MMG (if hormonal medication request): 08/27/17 BIRADS 1 negative  Refill authorized: 06/24/17 #90, 1 RF. Today, please advise.   Medication pended to [facility].

## 2018-04-04 NOTE — Telephone Encounter (Signed)
°  Message   ----- Message from [place], Generic sent at 04/04/2018 11:15 AM EDT -----    I am requesting a refill of my Clonazepam Rx be called into [facility] [address]. I tried to go to the request Rx part of MyChart and it just said my current Rx had expired but I could not request it from there.  Sorry if this is the wrong way to request. My appointment with Dr. Sabra Heck is 06/30/18. Thanks so much!  Jessica Hawking [place],MD

## 2018-04-07 ENCOUNTER — Encounter: Payer: Self-pay | Admitting: Obstetrics & Gynecology

## 2018-04-07 ENCOUNTER — Other Ambulatory Visit: Payer: Self-pay | Admitting: Obstetrics & Gynecology

## 2018-04-07 MED ORDER — CLONAZEPAM 1 MG PO TABS: 1.0000 mg | ORAL_TABLET | Freq: Every evening | ORAL | 1 refills | Status: DC | PRN

## 2018-04-07 NOTE — Telephone Encounter (Signed)
Left message letting patient know that RX was called in -eh

## 2018-04-07 NOTE — Telephone Encounter (Signed)
Patient sent the following correspondence through [facility]. Routing to Rx Refill Pool to assist patient with request.  ----- Message from [place], Generic sent at 04/07/2018 12:17 PM EDT -----    I never heard back about the clonazepam refill I requested on 6/20. I am close to running out.  Thank you.  Margaretha Glassing, MD

## 2018-05-19 ENCOUNTER — Ambulatory Visit: Payer: 59 | Admitting: Neurology

## 2018-05-19 ENCOUNTER — Encounter: Payer: Self-pay | Admitting: Neurology

## 2018-05-19 ENCOUNTER — Other Ambulatory Visit: Payer: Self-pay

## 2018-05-19 VITALS — BP 134/88 | HR 59 | Ht 63.0 in | Wt 162.0 lb

## 2018-05-19 DIAGNOSIS — G620 Drug-induced polyneuropathy: Secondary | ICD-10-CM | POA: Diagnosis not present

## 2018-05-19 NOTE — Progress Notes (Signed)
Reason for visit: Peripheral neuropathy  Jessica Mack is an 62 y.o. female  History of present illness:  Jessica Mack is a 62 year old right-handed white female with a history of breast cancer and a subsequent neuropathy.  The patient likely has a neuronopathy associated with use of Taxol previously.  The patient has had ongoing symptoms of hypersensitivity involving the hands and feet.  She has some mild balance issues, she reports no recent falls.  The patient tries to workout on a regular basis, she walks regularly, and she takes Pilates.  She gets therapeutic massage which is beneficial.  No significant changes in her clinical condition have been noted since last seen.  The patient has been on a multitude of medications for her neuropathy without significant benefit, the patient has also had difficulty tolerating medications.  She takes a midday nap given her underlying fatigue.  This also seemed to help.  She returns for an evaluation.  She is on disability, she currently is not getting any medications through this office.  Past Medical History:  Diagnosis Date  . Avascular necrosis of bone (HCC)    Left talus  . Blood transfusion   . Dysplastic nevus 1988   right hip  . GERD (gastroesophageal reflux disease)   . History of breast cancer 2005   invasive ductal cancer left breast  . Hypertension   . Hypertriglyceridemia   . Irritable bowel syndrome   . Metabolic syndrome    resolved with diet and exercise  . Migraines   . Nocturnal leg cramps 02/14/2015  . Osteopenia   . Palpitations 12/13   normal echo  . Peripheral neuropathy   . Personal history of radiation therapy   . Plantar fasciitis 6/04  . Tongue dysplasia   . Ulcer     Past Surgical History:  Procedure Laterality Date  . ANKLE SURGERY  07/2007   left, revascularization  . BREAST LUMPECTOMY  06/2004   left, with port placement  . [place]   x2  . COLONOSCOPY  09/2013   Dr. Collene Mares  .  LAPAROSCOPIC CHOLECYSTECTOMY  2003  . MOUTH SURGERY  11/15   implant/crown placed 4/16  . SHOULDER ARTHROSCOPY  10/2005   right  . SHOULDER SURGERY  12/15   left  . TONGUE SURGERY  2012, 2013  . WRIST SURGERY  1988   right, DeQuervains    Family History  Problem Relation Age of Onset  . Cancer Mother        melanoma  . Cancer Father        colon  . Prostate cancer Father   . Asthma Brother   . Asthma Sister   . Breast cancer Neg Hx     Social history:  reports that she has never smoked. She has never used smokeless tobacco. She reports that she drinks about 4.2 oz of alcohol per week. She reports that she does not use drugs.    Allergies  Allergen Reactions  . Augmentin [Amoxicillin-Pot Clavulanate]     abd cramping  . Cephalexin     Profuse diarrhea  . Crestor [Rosuvastatin]     Liver function elevated     Medications:  Prior to Admission medications   Medication Sig Start Date End Date Taking? Authorizing Provider  clonazePAM (KLONOPIN) 1 MG tablet Take 1 tablet (1 mg total) by mouth at bedtime as needed for anxiety. Patient taking differently: Take 0.5 mg by mouth at bedtime as needed for  anxiety.  04/07/18  Yes Megan Salon, MD  Multiple Vitamins-Minerals (MULTIVITAMIN PO) Take 1 tablet by mouth daily. With calcium   Yes [provider]  nebivolol (BYSTOLIC) 10 MG tablet Take 10 mg by mouth daily.   Yes [provider]  Omega-3-acid Ethyl Esters (LOVAZA PO) Take by mouth. 2G twice daily   Yes [provider]  Probiotic Product (ALIGN PO) Take 1 tablet by mouth daily.   Yes [provider]    ROS:  Out of a complete 14 system review of symptoms, the patient complains only of the following symptoms, and all other reviewed systems are negative.  Numbness, dysesthesias  Blood pressure 134/88, pulse (!) 59, height 5\' 3"  (1.6 m), weight 162 lb (73.5 kg), last menstrual period 06/26/2004.  Physical Exam  General: The patient  is alert and cooperative at the time of the examination.  The patient is minimally obese.  Skin: No significant peripheral edema is noted.   Neurologic Exam  Mental status: The patient is alert and oriented x 3 at the time of the examination. The patient has apparent normal recent and remote memory, with an apparently normal attention span and concentration ability.   Cranial nerves: Facial symmetry is present. Speech is normal, no aphasia or dysarthria is noted. Extraocular movements are full. Visual fields are full.  Motor: The patient has good strength in all 4 extremities.  Sensory examination: Soft touch sensation is symmetric on the face, arms, and legs.  Coordination: The patient has good finger-nose-finger and heel-to-shin bilaterally.  Gait and station: The patient has a normal gait. Tandem gait is minimally unsteady. Romberg is negative. No drift is seen.  Reflexes: Deep tendon reflexes are symmetric, but are depressed.   Assessment/Plan:  1.  Peripheral neuropathy, chemotherapy-induced  The patient is on disability because of the neuropathy, she will follow-up once a year for this reason.  She is on no medications, I have suggested that she could try carbamazepine or Keppra if she desires, she will contact me if she wishes to go on a medication.    Jill Alexanders MD 05/19/2018 8:24 AM  [facility] [address] [place], [place] [address]  Phone [phone] Fax [phone]

## 2018-06-30 ENCOUNTER — Ambulatory Visit: Payer: 59 | Admitting: Obstetrics & Gynecology

## 2018-06-30 ENCOUNTER — Encounter: Payer: Self-pay | Admitting: Obstetrics & Gynecology

## 2018-06-30 ENCOUNTER — Other Ambulatory Visit (HOSPITAL_COMMUNITY)
Admission: RE | Admit: 2018-06-30 | Discharge: 2018-06-30 | Disposition: A | Discharge status: Home / Self Care | Payer: 59 | Source: Ambulatory Visit | Attending: Obstetrics & Gynecology | Admitting: Obstetrics & Gynecology

## 2018-06-30 VITALS — BP 114/74 | HR 72 | Resp 14 | Ht 63.25 in | Wt 164.8 lb

## 2018-06-30 DIAGNOSIS — Z124 Encounter for screening for malignant neoplasm of cervix: Secondary | ICD-10-CM | POA: Diagnosis not present

## 2018-06-30 DIAGNOSIS — Z01419 Encounter for gynecological examination (general) (routine) without abnormal findings: Secondary | ICD-10-CM

## 2018-06-30 NOTE — Progress Notes (Signed)
62 y.o. G2P2 MarriedCaucasianF here for annual exam.  Going to the airport in [place] to pick up her sister.  Was just in [place] to help her daughter move in.  Had a medical emergency when she was on the plane--a woman had cardiac issues.    Denies vaginal bleeding.    PCP:  Dr. Forde Dandy.  Last blood work was done   Patient's last menstrual period was 06/26/2004.          Sexually active: No.  The current method of family planning is post menopausal status.    Exercising: Yes.    Home exercise routine includes walking track walking 7 days a week 2-3 miles hrs per day and [place]. Smoker:  no  Health Maintenance: Pap:  06/24/17 Neg. HR HPV:neg   06/23/16 Neg. HR HPV:neg  History of abnormal Pap:  yes MMG:  08/26/17 BIRADS1:Neg  Colonoscopy:  2014 f/u 5 years.  Pt aware this is due.   BMD:   08/26/17 Osteopenia TDaP:  2017 Pneumonia vaccine(s):  never Shingrix: 12/18 and 3/19 Hep C testing: gave blood Screening Labs: pcp, Hb today:  Urine today:    reports that she has never smoked. She has never used smokeless tobacco. She reports that she drinks about 7.0 standard drinks of alcohol per week. She reports that she does not use drugs.  Past Medical History:  Diagnosis Date  . Avascular necrosis of bone (HCC)    Left talus  . Blood transfusion   . Dysplastic nevus 1988   right hip  . GERD (gastroesophageal reflux disease)   . History of breast cancer 2005   invasive ductal cancer left breast  . Hypertension   . Hypertriglyceridemia   . Irritable bowel syndrome   . Metabolic syndrome    resolved with diet and exercise  . Migraines   . Nocturnal leg cramps 02/14/2015  . Osteopenia   . Palpitations 12/13   normal echo  . Peripheral neuropathy   . Personal history of radiation therapy   . Plantar fasciitis 6/04  . Tongue dysplasia   . Ulcer     Past Surgical History:  Procedure Laterality Date  . ANKLE SURGERY  07/2007   left, revascularization  . BREAST LUMPECTOMY  06/2004    left, with port placement  . [place]   x2  . COLONOSCOPY  09/2013   Dr. Collene Mares  . LAPAROSCOPIC CHOLECYSTECTOMY  2003  . MOUTH SURGERY  11/15   implant/crown placed 4/16  . SHOULDER ARTHROSCOPY  10/2005   right  . SHOULDER SURGERY  12/15   left  . TONGUE SURGERY  2012, 2013  . WRIST SURGERY  1988   right, DeQuervains    Current Outpatient Medications  Medication Sig Dispense Refill  . clonazePAM (KLONOPIN) 1 MG tablet Take 1 tablet (1 mg total) by mouth at bedtime as needed for anxiety. (Patient taking differently: Take 0.5 mg by mouth at bedtime as needed for anxiety. ) 90 tablet 1  . Multiple Vitamins-Minerals (MULTIVITAMIN PO) Take 1 tablet by mouth daily. With calcium    . nebivolol (BYSTOLIC) 10 MG tablet Take 10 mg by mouth daily.    . Omega-3-acid Ethyl Esters (LOVAZA PO) Take by mouth. 2G twice daily    . Probiotic Product (ALIGN PO) Take 1 tablet by mouth daily.     No current facility-administered medications for this visit.     Family History  Problem Relation Age of Onset  . Cancer Mother  melanoma  . Cancer Father        colon  . Prostate cancer Father   . Asthma Brother   . Asthma Sister   . Breast cancer Neg Hx     Review of Systems  All other systems reviewed and are negative.   Exam:   BP 114/74   Pulse 72   Resp 14   Ht 5' 3.25" (1.607 m)   Wt 164 lb 12.8 oz (74.8 kg)   LMP 06/26/2004   BMI 28.96 kg/m   Height: 5' 3.25" (160.7 cm)  Ht Readings from Last 3 Encounters:  06/30/18 5' 3.25" (1.607 m)  05/19/18 5\' 3"  (1.6 m)  06/24/17 5' 3.25" (1.607 m)    General appearance: alert, cooperative and appears stated age Head: Normocephalic, without obvious abnormality, atraumatic Neck: no adenopathy, supple, symmetrical, trachea midline and thyroid normal to inspection and palpation Lungs: clear to auscultation bilaterally Breasts: normal appearance, no masses or tenderness on right, left breast with well healed scars,  stable radiation changes and stable calcification nodule  Heart: regular rate and rhythm Abdomen: soft, non-tender; bowel sounds normal; no masses,  no organomegaly Extremities: extremities normal, atraumatic, no cyanosis or edema Skin: Skin color, texture, turgor normal. No rashes or lesions Lymph nodes: Cervical, supraclavicular, and axillary nodes normal. No abnormal inguinal nodes palpated Neurologic: Grossly normal   Pelvic: External genitalia:  Thickened white tissue that has been chronically present for several years, has been biopsied              Urethra:  normal appearing urethra with no masses, tenderness or lesions              Bartholins and Skenes: normal                 Vagina: normal appearing vagina with normal color and discharge, no lesions              Cervix: no lesions              Pap taken: Yes.   Bimanual Exam:  Uterus:  normal size, contour, position, consistency, mobility, non-tender              Adnexa: normal adnexa and no mass, fullness, tenderness               Rectovaginal: Confirms               Anus:  normal sphincter tone, no lesions  Chaperone was present for exam.  A:  Well Woman with normal exam PMP, no HRT H/O breast cancer 9/05 s/p left lumpectomy, SNB, s/p chemo and radiation, tamoxifen  10 years H/o metabolic syndrome with elevated triglycerides Osteopenia H/O chemotherapy induced neuropathy Vaginal atrophic changes Vulva skin changes that has been biopsied and is benign H/O fluctuating LFTs, followed by Dr. Forde Dandy  P:   Mammogram yearly.  Doing 3D. pap smear obtained today Doing blood work with Dr. Forde Dandy BMD due 2021 Aware colonoscopy due this year.  Will see Dr. Collene Mares. Does not need RF for clonazepam return annually or prn

## 2018-07-01 LAB — CYTOLOGY - PAP: Diagnosis: NEGATIVE

## 2018-07-04 LAB — HM PAP SMEAR

## 2018-07-13 ENCOUNTER — Telehealth: Payer: Self-pay | Admitting: Obstetrics & Gynecology

## 2018-07-13 ENCOUNTER — Encounter: Payer: Self-pay | Admitting: Obstetrics & Gynecology

## 2018-07-13 NOTE — Telephone Encounter (Signed)
Patient sent the following correspondence through [place]. Routing to triage to assist patient with request.  Are you giving flu shots at the office? I was just there! Do I need an appointment?

## 2018-07-13 NOTE — Telephone Encounter (Signed)
Responded to patient via mychart.  Will close encounter.   Hi Dr. Tamala Julian,  Unfortunately, we do not have any flu vaccines yet this year and we are not offering the flu shot to our practice patients.  Most of the Primary Cares seem to have them available at this time. [facility] has walk in time for flu shots on Tuesday, Wednesday and Thursday from 9-12 and 2-4 pm as well as flu clinics on Saturday 07/23/18 and 07/30/18 from 8 am-12.  I hope this helps!  Sincerely,   Karen Chafe, RN

## 2018-08-30 ENCOUNTER — Other Ambulatory Visit: Payer: Self-pay | Admitting: Gastroenterology

## 2018-08-30 DIAGNOSIS — R945 Abnormal results of liver function studies: Principal | ICD-10-CM

## 2018-08-30 DIAGNOSIS — R7989 Other specified abnormal findings of blood chemistry: Secondary | ICD-10-CM

## 2018-09-02 ENCOUNTER — Ambulatory Visit
Admission: RE | Admit: 2018-09-02 | Discharge: 2018-09-02 | Disposition: A | Discharge status: Home / Self Care | Payer: PRIVATE HEALTH INSURANCE | Source: Ambulatory Visit | Attending: Gastroenterology | Admitting: Gastroenterology

## 2018-09-08 ENCOUNTER — Other Ambulatory Visit: Payer: Self-pay | Admitting: Oncology

## 2018-09-08 ENCOUNTER — Encounter: Payer: Self-pay | Admitting: Oncology

## 2018-09-09 ENCOUNTER — Other Ambulatory Visit: Payer: Self-pay | Admitting: Obstetrics & Gynecology

## 2018-09-09 DIAGNOSIS — Z1231 Encounter for screening mammogram for malignant neoplasm of breast: Secondary | ICD-10-CM

## 2018-09-21 ENCOUNTER — Ambulatory Visit
Admission: RE | Admit: 2018-09-21 | Discharge: 2018-09-21 | Disposition: A | Discharge status: Home / Self Care | Payer: 59 | Source: Ambulatory Visit | Attending: Obstetrics & Gynecology | Admitting: Obstetrics & Gynecology

## 2018-09-21 DIAGNOSIS — Z1231 Encounter for screening mammogram for malignant neoplasm of breast: Secondary | ICD-10-CM

## 2018-09-21 HISTORY — DX: Malignant (primary) neoplasm, unspecified: C80.1

## 2018-09-21 HISTORY — DX: Personal history of antineoplastic chemotherapy: Z92.21

## 2018-09-21 HISTORY — DX: Malignant neoplasm of unspecified site of unspecified female breast: C50.919

## 2018-09-28 ENCOUNTER — Ambulatory Visit: Payer: PRIVATE HEALTH INSURANCE

## 2018-10-04 ENCOUNTER — Encounter: Payer: Self-pay | Admitting: Obstetrics & Gynecology

## 2018-10-04 ENCOUNTER — Other Ambulatory Visit: Payer: Self-pay | Admitting: Obstetrics & Gynecology

## 2018-10-04 ENCOUNTER — Telehealth: Payer: Self-pay | Admitting: Obstetrics & Gynecology

## 2018-10-04 NOTE — Telephone Encounter (Signed)
Rx request by patient pended for review by Dr. Sabra Heck.

## 2018-10-04 NOTE — Telephone Encounter (Signed)
Routed mychart message to Dr. Sabra Heck for review and pended Rx.

## 2018-10-04 NOTE — Telephone Encounter (Signed)
Patient sent the following correspondence through [place]. Routing to triage to assist patient with request.  Good morning! I just used my last dose of Acyclovir for a lovely huge fever blister and wondered if Dr. Sabra Heck could authorize a new Rx (it's been a couple of years and I got this Rx from my former dermatologist who has retired). I take 2 GM with prodrome and 2G in 12 hours. Due to cost, I would love to have the 400 mg. generic tabs (so I take 5 and then another 5 per episode, which occur 3-6 times a year).  So I would love a Rx for #60 of the 400 mg tabs to [facility]. Let me know if there is a problem with this. Thanks so much! Happy holidays! Jessica Glassing, MD

## 2018-10-13 LAB — HM COLONOSCOPY

## 2018-10-26 ENCOUNTER — Encounter: Payer: Self-pay | Admitting: Obstetrics & Gynecology

## 2018-10-28 ENCOUNTER — Other Ambulatory Visit: Payer: Self-pay | Admitting: Obstetrics & Gynecology

## 2018-10-28 MED ORDER — FAMCICLOVIR 500 MG PO TABS: ORAL_TABLET | ORAL | 1 refills | Status: DC

## 2018-11-04 ENCOUNTER — Telehealth: Payer: Self-pay

## 2018-11-04 DIAGNOSIS — Z0289 Encounter for other administrative examinations: Secondary | ICD-10-CM

## 2018-11-04 NOTE — Telephone Encounter (Signed)
Received disability paperwork from [employer]. Completed, sent to Dr. Jannifer Franklin for review and signature.

## 2018-11-07 NOTE — Telephone Encounter (Signed)
Forms completed by Dr. Jannifer Franklin. Forms fwd to medical records for processing.  Copies made and filed.

## 2018-11-10 ENCOUNTER — Telehealth: Payer: Self-pay | Admitting: *Deleted

## 2018-11-10 NOTE — Telephone Encounter (Signed)
I mailed pt [employer] form to pt home address.

## 2018-11-19 ENCOUNTER — Encounter: Payer: Self-pay | Admitting: Obstetrics & Gynecology

## 2018-11-21 ENCOUNTER — Other Ambulatory Visit: Payer: Self-pay | Admitting: Obstetrics & Gynecology

## 2018-11-21 NOTE — Telephone Encounter (Signed)
Patient sent the following correspondence through [place]. Routing to refill pool to assist patient with request.  Hi! I am requesting a refill on my Clonazepam 1 mg @ HS #90. I tried to request it from [facility] but it seems to have fallen off their list for me--maybe it expired before I refilled it? Not sure. Many thanks! Jeani Hawking Cordy,MD

## 2018-11-21 NOTE — Telephone Encounter (Signed)
Medication refill request: Klonopin  Last AEX:  06/30/18 SM Next AEX: 11/02/19 Last MMG (if hormonal medication request): 09/21/18 BIRADS1:Neg  Refill authorized: 04/07/18 #90/1R. Today please advise.

## 2018-11-22 MED ORDER — CLONAZEPAM 1 MG PO TABS: 1.0000 mg | ORAL_TABLET | Freq: Every evening | ORAL | 2 refills | Status: DC | PRN

## 2019-05-25 ENCOUNTER — Ambulatory Visit (INDEPENDENT_AMBULATORY_CARE_PROVIDER_SITE_OTHER): Payer: 59 | Admitting: Neurology

## 2019-05-25 ENCOUNTER — Encounter: Payer: Self-pay | Admitting: Neurology

## 2019-05-25 ENCOUNTER — Other Ambulatory Visit: Payer: Self-pay

## 2019-05-25 VITALS — BP 122/84 | HR 65 | Temp 96.2°F | Ht 63.5 in | Wt 159.3 lb

## 2019-05-25 DIAGNOSIS — G620 Drug-induced polyneuropathy: Secondary | ICD-10-CM | POA: Diagnosis not present

## 2019-05-25 DIAGNOSIS — G4762 Sleep related leg cramps: Secondary | ICD-10-CM | POA: Diagnosis not present

## 2019-05-25 NOTE — Progress Notes (Signed)
Reason for visit: Peripheral neuropathy  Jessica Mack is an 63 y.o. 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  The patient does have some discomfort in the legs, she has good days and bad days, she has not wished to go on any medications for symptomatic benefit.  She has some mild gait instability but she has not reported any falls since last seen.  She tries to exercise on a regular basis, she is involved with Pilates which helps her balance issues.  She occasionally will have some nocturnal leg cramps, they mainly involve the hamstring muscles at this time.  She returns for an evaluation, she currently is on disability.  Past Medical History:  Diagnosis Date  . Avascular necrosis of bone (HCC)    Left talus  . Blood transfusion   . Breast cancer ([place]) 2005   left  . Cancer ([place])   . Dysplastic nevus 1988   right hip  . GERD (gastroesophageal reflux disease)   . History of breast cancer 2005   invasive ductal cancer left breast  . Hypertension   . Hypertriglyceridemia   . Irritable bowel syndrome   . Metabolic syndrome    resolved with diet and exercise  . Migraines   . Nocturnal leg cramps 02/14/2015  . Osteopenia   . Palpitations 12/13   normal echo  . Peripheral neuropathy   . Personal history of chemotherapy   . Personal history of radiation therapy   . Plantar fasciitis 6/04  . Tongue dysplasia   . Ulcer     Past Surgical History:  Procedure Laterality Date  . ANKLE SURGERY  07/2007   left, revascularization  . BREAST LUMPECTOMY  06/2004   left, with port placement  . [place]   x2  . COLONOSCOPY  09/2013   Dr. Collene Mares  . LAPAROSCOPIC CHOLECYSTECTOMY  2003  . MOUTH SURGERY  11/15   implant/crown placed 4/16  . SHOULDER ARTHROSCOPY  10/2005   right  . SHOULDER SURGERY  12/15   left   . TONGUE SURGERY  2012, 2013  . WRIST SURGERY  1988   right, DeQuervains    Family History  Problem Relation Age of Onset  . Cancer Mother        melanoma  . Cancer Father        colon  . Prostate cancer Father   . Asthma Brother   . Asthma Sister   . Breast cancer Neg Hx     Social history:  reports that she has never smoked. She has never used smokeless tobacco. She reports current alcohol use of about 7.0 standard drinks of alcohol per week. She reports that she does not use drugs.    Allergies  Allergen Reactions  . Augmentin [Amoxicillin-Pot Clavulanate]     abd cramping  . Cephalexin     Profuse diarrhea  . Crestor [Rosuvastatin]     Liver function elevated     Medications:  Prior to Admission medications   Medication Sig Start Date End Date Taking? Authorizing Provider  clonazePAM (KLONOPIN) 1 MG tablet Take 1 tablet (1 mg total) by mouth at bedtime as needed for anxiety. 11/22/18  Yes Megan Salon, MD  famciclovir [facility]) 500 MG tablet 3 tablets (1500mg ) at onset of fever blister symptoms 10/28/18  Yes Megan Salon, MD  Multiple  Vitamins-Minerals (MULTIVITAMIN PO) Take 1 tablet by mouth daily. With calcium   Yes [provider]  nebivolol (BYSTOLIC) 10 MG tablet Take 10 mg by mouth daily.   Yes [provider]  Omega-3-acid Ethyl Esters (LOVAZA PO) Take by mouth. 2G twice daily   Yes [provider]  Probiotic Product (ALIGN PO) Take 1 tablet by mouth daily.   Yes [provider]    ROS:  Out of a complete 14 system review of symptoms, the patient complains only of the following symptoms, and all other reviewed systems are negative.  Leg cramps Numbness Balance problems  Blood pressure 122/84, pulse 65, temperature (!) 96.2 F (35.7 C), temperature source Temporal, height 5' 3.5" (1.613 m), weight 159 lb 5 oz (72.3 kg), last menstrual period 06/26/2004, SpO2 97 %.  Physical Exam  General: The patient is alert and  cooperative at the time of the examination.  Skin: No significant peripheral edema is noted.   Neurologic Exam  Mental status: The patient is alert and oriented x 3 at the time of the examination. The patient has apparent normal recent and remote memory, with an apparently normal attention span and concentration ability.   Cranial nerves: Facial symmetry is present. Speech is normal, no aphasia or dysarthria is noted. Extraocular movements are full. Visual fields are full.  Motor: The patient has good strength in all 4 extremities.  Sensory examination: Soft touch sensation is symmetric on the face, arms, and legs.  Coordination: The patient has good finger-nose-finger and heel-to-shin bilaterally.  Gait and station: The patient has a normal gait. Tandem gait is slightly unsteady. Romberg is negative. No drift is seen.  Reflexes: Deep tendon reflexes are symmetric, but are depressed to absent throughout.   Assessment/Plan:  1.  Taxol induced neuronopathy  2.  Mild gait instability  3.  Nocturnal leg cramps  The patient is doing fairly well at this point, her condition has been stable, mainly because of her disability, we will continue to follow her once yearly.  Jessica Alexanders MD 05/25/2019 8:01 AM  [facility] [address] [place], [place] [address]  Phone [phone] Fax [phone]

## 2019-08-09 ENCOUNTER — Other Ambulatory Visit: Payer: Self-pay | Admitting: Obstetrics & Gynecology

## 2019-08-09 DIAGNOSIS — Z1231 Encounter for screening mammogram for malignant neoplasm of breast: Secondary | ICD-10-CM

## 2019-08-29 ENCOUNTER — Other Ambulatory Visit: Payer: Self-pay | Admitting: Obstetrics & Gynecology

## 2019-08-29 ENCOUNTER — Encounter: Payer: Self-pay | Admitting: Obstetrics & Gynecology

## 2019-08-29 MED ORDER — CLONAZEPAM 1 MG PO TABS: 1.0000 mg | ORAL_TABLET | Freq: Every evening | ORAL | 1 refills | Status: DC | PRN

## 2019-08-29 NOTE — Telephone Encounter (Signed)
Medication refill request: Clonazepam  Last AEX: 06/30/18 Next AEX: 11/02/19 Last MMG (if hormonal medication request): NA Refill authorized: #90 with 0 RF

## 2019-08-29 NOTE — Telephone Encounter (Signed)
Patient sent the following correspondence through [facility] [place].  Good morning.  I am writing to request a new Rx for Clonazepam 1 mg. #90, 1 @ HS prn with one refill.  I would prefer it be sent to [facility] [address] since I have no need (or desire!) to go to [facility] anytime soon.  MyChart says I cannot request a refill thru the refill choice on the dropdown menu because the Rx has expired.  I last filled this Rx in April and did not use the 1 refill so the Rx expired in July.  I have an appointment for my annual in January (and am on the cancellation list for sooner since I was due in September)--but will run out of the Clonazepam before then.  Please let me know if you need more information.  I would love to have my exam in 2020!!  My schedule is very flexible and I can come very last minute.   Thanks so much.  Jessica Glassing, MD

## 2019-09-26 ENCOUNTER — Ambulatory Visit: Payer: 59

## 2019-09-27 ENCOUNTER — Other Ambulatory Visit: Payer: Self-pay

## 2019-09-27 ENCOUNTER — Ambulatory Visit
Admission: RE | Admit: 2019-09-27 | Discharge: 2019-09-27 | Disposition: A | Discharge status: Home / Self Care | Payer: 59 | Source: Ambulatory Visit | Attending: Obstetrics & Gynecology | Admitting: Obstetrics & Gynecology

## 2019-09-27 DIAGNOSIS — Z1231 Encounter for screening mammogram for malignant neoplasm of breast: Secondary | ICD-10-CM

## 2019-09-27 LAB — HM MAMMOGRAPHY: HM Mammogram: NORMAL

## 2019-10-31 ENCOUNTER — Telehealth: Payer: Self-pay | Admitting: *Deleted

## 2019-10-31 NOTE — Telephone Encounter (Signed)
[id number]/BZMCEY Davis,BSN,RN3,CCM,CN/TCT-[place] concerning emergency surgery of husband kMark.  erforated gastric ulcer and had to have an emergency Exl lp and repair.  Is at [facility] in the icu byt doing well.  States that husband was extubated this am.  STates that she has gotten some rest during the night.  Gave name and number for future ref. And needs.  Will call for any needs or questions.

## 2019-11-02 ENCOUNTER — Other Ambulatory Visit: Payer: Self-pay

## 2019-11-02 ENCOUNTER — Encounter: Payer: Self-pay | Admitting: Obstetrics & Gynecology

## 2019-11-02 ENCOUNTER — Ambulatory Visit (INDEPENDENT_AMBULATORY_CARE_PROVIDER_SITE_OTHER): Payer: 59 | Admitting: Obstetrics & Gynecology

## 2019-11-02 VITALS — BP 136/78 | HR 72 | Resp 12 | Ht 63.25 in | Wt 159.0 lb

## 2019-11-02 DIAGNOSIS — Z01419 Encounter for gynecological examination (general) (routine) without abnormal findings: Secondary | ICD-10-CM | POA: Diagnosis not present

## 2019-11-02 DIAGNOSIS — M8580 Other specified disorders of bone density and structure, unspecified site: Secondary | ICD-10-CM | POA: Diagnosis not present

## 2019-11-02 NOTE — Progress Notes (Addendum)
64 y.o. G2P2 Married White or Caucasian female here for annual exam.  Husband just moved from ICU care.  Had perforated duodenal ulcer that required surgery.  He had an omental patch placed.  He is better now.  Now they are waiting for bowel function to return.  Has an NG tube. Has swallowing study scheduled for today.  [name] is in [place].  Maylon Cos and his girlfriend came home after doing a quarantine for 14 days.    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    Had lab work with Dr. Collene Mares.  This was about a year ago.  Ferritin was elevated.  She is going to have lab work with Carrolyn Meiers.    Patient's last menstrual period was 06/26/2004.          Sexually active: No.  The current method of family planning is post menopausal status.    Exercising: Yes.    walking and HIIT  Smoker:  no  Health Maintenance: Pap:  06/24/17 Neg. HR HPV:neg             06/23/16 Neg. HR HPV:neg  History of abnormal Pap:  yes MMG:  09/27/19 BIRADS 2 benign/density b Colonoscopy:  December 2019 polyps removed f/u 5 years BMD:   08/26/17 Osteopenia, -1.4 TDaP:  12/12/15 Pneumonia vaccine(s):  never Shingrix:   12/18 and 3/19 Hep C testing: donated blood in the past Screening Labs: She will plan to do this with Dr. Forde Dandy   reports that she has never smoked. She has never used smokeless tobacco. She reports current alcohol use of about 7.0 standard drinks of alcohol per week. She reports that she does not use drugs.  Past Medical History:  Diagnosis Date  . Avascular necrosis of bone (HCC)    Left talus  . Blood transfusion   . Breast cancer ([place]) 2005   left  . Cancer ([place])   . Dysplastic nevus 1988   right hip  . GERD (gastroesophageal reflux disease)   . History of breast cancer 2005   invasive ductal cancer left breast  . Hypertension   . Hypertriglyceridemia   . Irritable bowel syndrome   . Metabolic syndrome    resolved with diet and exercise  .  Migraines   . Nocturnal leg cramps 02/14/2015  . Osteopenia   . Palpitations 12/13   normal echo  . Peripheral neuropathy   . Personal history of chemotherapy   . Personal history of radiation therapy   . Plantar fasciitis 6/04  . Tongue dysplasia   . Ulcer     Past Surgical History:  Procedure Laterality Date  . ANKLE SURGERY  07/2007   left, revascularization  . BREAST LUMPECTOMY  06/2004   left, with port placement  . [place]   x2  . COLONOSCOPY  09/2013   Dr. Collene Mares  . LAPAROSCOPIC CHOLECYSTECTOMY  2003  . MOUTH SURGERY  11/15   implant/crown placed 4/16  . SHOULDER ARTHROSCOPY  10/2005   right  . SHOULDER SURGERY  12/15   left  . TONGUE SURGERY  2012, 2013  . WRIST SURGERY  1988   right, DeQuervains    Current Outpatient Medications  Medication Sig Dispense Refill  . clonazePAM (KLONOPIN) 1 MG tablet Take 1 tablet (1 mg total) by mouth at bedtime as needed for anxiety. 90 tablet 1  . famciclovir (FAMVIR) 500 MG tablet 3 tablets (1561m)  at onset of fever blister symptoms 15 tablet 1  . famotidine (PEPCID) 10 MG tablet Take 10 mg by mouth daily.    . Multiple Vitamins-Minerals (MULTIVITAMIN PO) Take 1 tablet by mouth daily. With calcium    . nebivolol (BYSTOLIC) 10 MG tablet Take 10 mg by mouth daily.    . Omega-3-acid Ethyl Esters (LOVAZA PO) Take by mouth. 2G twice daily    . Probiotic Product (ALIGN PO) Take 1 tablet by mouth daily.     No current facility-administered medications for this visit.    Family History  Problem Relation Age of Onset  . Cancer Mother        melanoma  . Cancer Father        colon  . Prostate cancer Father   . Asthma Brother   . Asthma Sister   . Breast cancer Neg Hx     Review of Systems  All other systems reviewed and are negative.   Exam:   BP 136/78 (BP Location: Right Arm, Patient Position: Sitting, Cuff Size: Normal)   Pulse 72   Resp 12   Ht 5' 3.25" (1.607 m)   Wt 159 lb (72.1 kg)   LMP  06/26/2004   BMI 27.94 kg/m   Height: 5' 3.25" (160.7 cm)  Ht Readings from Last 3 Encounters:  11/02/19 5' 3.25" (1.607 m)  05/25/19 5' 3.5" (1.613 m)  06/30/18 5' 3.25" (1.607 m)    General appearance: alert, cooperative and appears stated age Head: Normocephalic, without obvious abnormality, atraumatic Neck: no adenopathy, supple, symmetrical, trachea midline and thyroid normal to inspection and palpation Lungs: clear to auscultation bilaterally Breasts: normal appearance, no masses or tenderness Heart: regular rate and rhythm Abdomen: soft, non-tender; bowel sounds normal; no masses,  no organomegaly Extremities: extremities normal, atraumatic, no cyanosis or edema Skin: Skin color, texture, turgor normal. No rashes or lesions Lymph nodes: Cervical, supraclavicular, and axillary nodes normal. No abnormal inguinal nodes palpated Neurologic: Grossly normal   Pelvic: External genitalia:  Hypopigmentation of skin at introitus that is stable in appearance              Urethra:  normal appearing urethra with no masses, tenderness or lesions              Bartholins and Skenes: normal                 Vagina: normal appearing vagina with normal color and discharge, no lesions              Cervix: no lesions              Pap taken: No. Bimanual Exam:  Uterus:  normal size, contour, position, consistency, mobility, non-tender              Adnexa: normal adnexa and no mass, fullness, tenderness               Rectovaginal: Confirms               Anus:  normal sphincter tone, no lesions  Chaperone, Terence Lux, CMA, was present for exam.  A:  Well Woman with normal exam PMP, no HRT H/o breast cancer 9/05 s/p left lumpectomy, SNB, s/p chemo and radiation, tamoxifen 10 years H/o metabolic syndrome with elevated triglycerides Osteopenia H/o chemotherapy induced neuropathy Vaginal atrophic changes H/o vulvar biopsy x 2 that is benign.  Stable changes on exam. H/o fluctuating LFTs  followed by Dr. Forde Dandy  P:   Mammogram  guidelines reviewed.  Doing yearly. pap smear and neg HR HPV 2018.  New guidelines reviewed.  She is aware repeat HPV recommended in 2023. Plan repeat BMD with MMG this year.  Order placed. Does not need RF for clonazepam Will plan lab work with Dr. Forde Dandy Return annually or prn

## 2019-11-30 ENCOUNTER — Other Ambulatory Visit: Payer: Self-pay

## 2019-11-30 ENCOUNTER — Encounter: Payer: Self-pay | Admitting: Family Medicine

## 2019-11-30 ENCOUNTER — Ambulatory Visit: Payer: 59 | Admitting: Family Medicine

## 2019-11-30 ENCOUNTER — Ambulatory Visit (INDEPENDENT_AMBULATORY_CARE_PROVIDER_SITE_OTHER): Payer: 59

## 2019-11-30 VITALS — BP 118/84 | HR 62 | Ht 63.0 in | Wt 159.0 lb

## 2019-11-30 DIAGNOSIS — M25551 Pain in right hip: Secondary | ICD-10-CM

## 2019-11-30 DIAGNOSIS — S76311A Strain of muscle, fascia and tendon of the posterior muscle group at thigh level, right thigh, initial encounter: Secondary | ICD-10-CM

## 2019-11-30 DIAGNOSIS — M7711 Lateral epicondylitis, right elbow: Secondary | ICD-10-CM

## 2019-11-30 MED ORDER — VITAMIN D (ERGOCALCIFEROL) 1.25 MG (50000 UNIT) PO CAPS: 50000.0000 [IU] | ORAL_CAPSULE | ORAL | 0 refills | Status: DC

## 2019-11-30 NOTE — Patient Instructions (Signed)
Good to see you.  Ice 20 minutes 2 times daily. Usually after activity and before bed. Exercises 3 times a week.  Once weekly vitamin D for 12 weeks.  Turmeric 580m daily  Tart cherry extract 12069mat night B complex Avoid overhand lifting  See me again in 4-6 weeks

## 2019-11-30 NOTE — Progress Notes (Signed)
[place] Phone: [phone] Subjective:   Jessica Mack, am serving as a scribe for Dr. Hulan Saas. This visit occurred during the SARS-CoV-2 public health emergency.  Safety protocols were in place, including screening questions prior to the visit, additional usage of staff PPE, and extensive cleaning of exam room while observing appropriate contact time as indicated for disinfecting solutions.   I'm seeing this patient by the request  of:  Reynold Bowen, MD  CC: back pain elbow pain   [id number]  Jessica Mack is a 64 y.o. female coming in with complaint of right hip pain. History of ischial bursitis. Has had injections in that area. Has intermittent flares. Pain has increased over the past month. Walks 15-20 miles a week. Pain sometimes radiates down into the hamstring. Does do pilates on a regular basis. Has not been walking her dog or do anything so has had improvement over past 2 days.  Is also having right elbow pain over the lateral epicondyle. Pain with carrying groceries and walking her dog.  Patient states that it is a dull aching sensation at night as well.  Patient denies any radiation down to the hand.  Denies any 5 out of 10 pain.       Past Medical History:  Diagnosis Date  . Avascular necrosis of bone (HCC)    Left talus  . Blood transfusion   . Breast cancer ([place]) 2005   left  . Cancer ([place])   . Dysplastic nevus 1988   right hip  . GERD (gastroesophageal reflux disease)   . History of breast cancer 2005   invasive ductal cancer left breast  . Hypertension   . Hypertriglyceridemia   . Irritable bowel syndrome   . Metabolic syndrome    resolved with diet and exercise  . Migraines   . Nocturnal leg cramps 02/14/2015  . Osteopenia   . Palpitations 12/13   normal echo  . Peripheral neuropathy   . Personal history of chemotherapy   . Personal history of radiation therapy   .  Plantar fasciitis 6/04  . Tongue dysplasia   . Ulcer    Past Surgical History:  Procedure Laterality Date  . ANKLE SURGERY  07/2007   left, revascularization  . BREAST LUMPECTOMY  06/2004   left, with port placement  . [place]   x2  . COLONOSCOPY  09/2013   Dr. Collene Mares  . LAPAROSCOPIC CHOLECYSTECTOMY  2003  . MOUTH SURGERY  11/15   implant/crown placed 4/16  . SHOULDER ARTHROSCOPY  10/2005   right  . SHOULDER SURGERY  12/15   left  . TONGUE SURGERY  2012, 2013  . WRIST SURGERY  1988   right, [place]   Social History   Socioeconomic History  . Marital status: Married    Spouse name: Not on file  . Number of children: 2  . Years of education: MD  . Highest education level: Not on file  Occupational History    Employer: [employer]  Tobacco Use  . Smoking status: Never Smoker  . Smokeless tobacco: Never Used  Substance and Sexual Activity  . Alcohol use: Yes    Alcohol/week: 7.0 standard drinks    Types: 7 Standard drinks or equivalent per week  . Drug use: Mack  . Sexual activity: Not Currently    Partners: Male    Birth control/protection: Post-menopausal    Comment: husband vasectomy  Other Topics Concern  . Not on file  Social History Narrative   Lives at home w/ husband and daughter   Retired OB/GYN physician   Patient is right handed.   Patient drinks 2 cups of caffeine daily.   Social Determinants of Health   Financial Resource Strain:   . Difficulty of Paying Living Expenses: Not on file  Food Insecurity:   . Worried About Charity fundraiser in the Last Year: Not on file  . Ran Out of Food in the Last Year: Not on file  Transportation Needs:   . Lack of Transportation (Medical): Not on file  . Lack of Transportation (Non-Medical): Not on file  Physical Activity:   . Days of Exercise per Week: Not on file  . Minutes of Exercise per Session: Not on file  Stress:   . Feeling of Stress : Not on file  Social Connections:     . Frequency of Communication with Friends and Family: Not on file  . Frequency of Social Gatherings with Friends and Family: Not on file  . Attends Religious Services: Not on file  . Active Member of Clubs or Organizations: Not on file  . Attends Archivist Meetings: Not on file  . Marital Status: Not on file   Allergies  Allergen Reactions  . Augmentin [Amoxicillin-Pot Clavulanate]     abd cramping  . Cephalexin     Profuse diarrhea  . Crestor [Rosuvastatin]     Liver function elevated    Family History  Problem Relation Age of Onset  . Cancer Mother        melanoma  . Cancer Father        colon  . Prostate cancer Father   . Asthma Brother   . Asthma Sister   . Breast cancer Neg Hx      Current Outpatient Medications (Cardiovascular):  .  nebivolol (BYSTOLIC) 10 MG tablet, Take 10 mg by mouth daily. .  Omega-3-acid Ethyl Esters (LOVAZA PO), Take by mouth. 2G twice daily     Current Outpatient Medications (Other):  .  clonazePAM (KLONOPIN) 1 MG tablet, Take 1 tablet (1 mg total) by mouth at bedtime as needed for anxiety. .  famciclovir (FAMVIR) 500 MG tablet, 3 tablets (1525m) at onset of fever blister symptoms .  famotidine (PEPCID) 10 MG tablet, Take 10 mg by mouth daily. .  Multiple Vitamins-Minerals (MULTIVITAMIN PO), Take 1 tablet by mouth daily. With calcium .  Probiotic Product (ALIGN PO), Take 1 tablet by mouth daily. .  Vitamin D, Ergocalciferol, (DRISDOL) 1.25 MG (50000 UNIT) CAPS capsule, Take 1 capsule (50,000 Units total) by mouth every 7 (seven) days.   Reviewed prior external information including notes and imaging from  primary care provider As well as notes that were available from care everywhere and other healthcare systems.  Past medical history, social, surgical and family history all reviewed in electronic medical record.  Mack pertanent information unless stated regarding to the chief complaint.   Review of Systems:  Mack headache,  visual changes, nausea, vomiting, diarrhea, constipation, dizziness, abdominal pain, skin rash, fevers, chills, night sweats, weight loss, swollen lymph nodes, body aches, joint swelling, chest pain, shortness of breath, mood changes. POSITIVE muscle aches  Objective  Blood pressure 118/84, pulse 62, height 5' 3"  (1.6 m), weight 159 lb (72.1 kg), last menstrual period 06/26/2004, SpO2 97 %.   General: Mack apparent distress alert and oriented x3 mood and affect normal, dressed appropriately.  HEENT:  Pupils equal, extraocular movements intact  Respiratory: Patient's speak in full sentences and does not appear short of breath  Cardiovascular: Mack lower extremity edema, non tender, Mack erythema  Skin: Warm dry intact with Mack signs of infection or rash on extremities or on axial skeleton.  Abdomen: Soft nontender  Neuro: Cranial nerves II through XII are intact, neurovascularly intact in all extremities with 2+ DTRs and 2+ pulses.  Lymph: Mack lymphadenopathy of posterior or anterior cervical chain or axillae bilaterally.  Gait normal with good balance and coordination.  MSK: Very mild arthritic changes of multiple joints.  Patient is low back does have some loss of lordosis.  Poor core strength noted.  Patient has some tenderness to palpation in the paraspinal musculature lumbar spine right greater than left.  Mack severe tenderness over the piriformis.  Negative straight leg test.  Right elbow exam shows the patient is severely tender to palpation over the lateral epicondylar region.  Increasing pain with resisted wrist extension.  Good grip strength noted.  Negative Tinel's at the wrist.  Negative Tinel's at the ulnar area as well.  Full range of motion of the shoulder.  Negative Spurling's of the neck.  .  [id number]; 15 additional minutes spent for Therapeutic exercises as stated in above notes.  This included exercises focusing on stretching, strengthening, with significant focus on eccentric aspects.    Long term goals include an improvement in range of motion, strength, endurance as well as avoiding reinjury. Patient's frequency would include in 1-2 times a day, 3-5 times a week for a duration of 6-12 weeks. Low back exercises that included:  Pelvic tilt/bracing instruction to focus on control of the pelvic girdle and lower abdominal muscles  Glute strengthening exercises, focusing on proper firing of the glutes without engaging the low back muscles Proper stretching techniques for maximum relief for the hamstrings, hip flexors, low back and some rotation where tolerated   Proper technique shown and discussed handout in great detail with ATC.  All questions were discussed and answered.     Impression and Recommendations:     This case required medical decision making of moderate complexity. The above documentation has been reviewed and is accurate and complete Lyndal Pulley, DO       Note: This dictation was prepared with Dragon dictation along with smaller phrase technology. Any transcriptional errors that result from this process are unintentional.

## 2019-11-30 NOTE — Assessment & Plan Note (Signed)
Piriformis syndrome.  Discussed home exercise, importance of more stretching the hip flexors and strengthening the hip abductors.  Musculoskeletal consistent with instability.  Patient will increase activity slowly over the course of next several weeks.  Follow-up with me again in 4 to 8 weeks worsening pain we will consider formal physical therapy or potential injections

## 2019-11-30 NOTE — Assessment & Plan Note (Signed)
Elbow anatomy was reviewed, and tendinopathy was explained.  Pt. given a home rehab program. Start with isometrics and ROM, then a series of concentric and eccentric exercises should be done starting with no weight, work up to 1 lb, hammer, etc.  Use counterforce strap if working or using hands.  Formal PT would be beneficial. Emphasized stretching an cross-friction massage Emphasized proper palms up lifting biomechanics to unload ECRB RTC in 4-8 weeks

## 2019-12-04 ENCOUNTER — Ambulatory Visit: Payer: 59 | Admitting: Family Medicine

## 2019-12-07 ENCOUNTER — Encounter: Payer: Self-pay | Admitting: Obstetrics & Gynecology

## 2019-12-10 ENCOUNTER — Encounter: Payer: Self-pay | Admitting: Family Medicine

## 2020-01-11 ENCOUNTER — Other Ambulatory Visit: Payer: Self-pay

## 2020-01-11 ENCOUNTER — Encounter: Payer: Self-pay | Admitting: Family Medicine

## 2020-01-11 ENCOUNTER — Ambulatory Visit: Payer: 59 | Admitting: Family Medicine

## 2020-01-11 ENCOUNTER — Ambulatory Visit (INDEPENDENT_AMBULATORY_CARE_PROVIDER_SITE_OTHER): Payer: 59 | Admitting: Family Medicine

## 2020-01-11 DIAGNOSIS — M7711 Lateral epicondylitis, right elbow: Secondary | ICD-10-CM | POA: Diagnosis not present

## 2020-01-11 DIAGNOSIS — S76311D Strain of muscle, fascia and tendon of the posterior muscle group at thigh level, right thigh, subsequent encounter: Secondary | ICD-10-CM | POA: Diagnosis not present

## 2020-01-11 NOTE — Assessment & Plan Note (Signed)
Nearly resolved.  No significant tenderness on exam today.  Discussed still lifting techniques.  Likely will resolve in the next several weeks.

## 2020-01-11 NOTE — Assessment & Plan Note (Signed)
Patient is making good progression at the moment.  Discussed with patient about continuing the home exercise, discussed in the vitamin supplementations, increase activity slowly.  Follow-up again in 4 to 8 weeks.

## 2020-01-11 NOTE — Patient Instructions (Addendum)
Good to see you Continue what you are doing See you again in 4-6 weeks

## 2020-01-11 NOTE — Progress Notes (Signed)
[place] [address] [place] Phone: [phone] Subjective:   I Jessica Mack am serving as a Education administrator for Dr. Hulan Saas.  This visit occurred during the SARS-CoV-2 public health emergency.  Safety protocols were in place, including screening questions prior to the visit, additional usage of staff PPE, and extensive cleaning of exam room while observing appropriate contact time as indicated for disinfecting solutions.   I'm seeing this patient by the request  of:  Reynold Bowen, MD  CC: Buttocks pain follow-up  [id number]   2/11/30/2019 Piriformis syndrome.  Discussed home exercise, importance of more stretching the hip flexors and strengthening the hip abductors.  Musculoskeletal consistent with instability.  Patient will increase activity slowly over the course of next several weeks.  Follow-up with me again in 4 to 8 weeks worsening pain we will consider formal physical therapy or potential injections  Elbow anatomy was reviewed, and tendinopathy was explained.  Pt. given a home rehab program. Start with isometrics and ROM, then a series of concentric and eccentric exercises should be done starting with no weight, work up to 1 lb, hammer, etc.  Use counterforce strap if working or using hands.  Formal PT would be beneficial. Emphasized stretching an cross-friction massage Emphasized proper palms up lifting biomechanics to unload ECRB RTC in 4-8 weeks   Update 01/11/2020 Jessica Mack is a 64 y.o. female coming in with complaint of right hip pain and right elbow pain. Patient states she is making improvements but not 100% yet.  Patient states that she was able to walk 3 miles which is the longer she has been able to walk in quite some time.  Patient states that the fourth mile felt like she was actually dragging her foot.  Patient has been doing the exercises occasionally.  Patient has been taking all the vitamin  supplementations and feels like she has made some progress.  Patient would state approximately 60 to 65% better.      Past Medical History:  Diagnosis Date  . Avascular necrosis of bone (HCC)    Left talus  . Blood transfusion   . Breast cancer ([place]) 2005   left  . Cancer ([place])   . Dysplastic nevus 1988   right hip  . GERD (gastroesophageal reflux disease)   . History of breast cancer 2005   invasive ductal cancer left breast  . Hypertension   . Hypertriglyceridemia   . Irritable bowel syndrome   . Metabolic syndrome    resolved with diet and exercise  . Migraines   . Nocturnal leg cramps 02/14/2015  . Osteopenia   . Palpitations 12/13   normal echo  . Peripheral neuropathy   . Personal history of chemotherapy   . Personal history of radiation therapy   . Plantar fasciitis 6/04  . Tongue dysplasia   . Ulcer    Past Surgical History:  Procedure Laterality Date  . ANKLE SURGERY  07/2007   left, revascularization  . BREAST LUMPECTOMY  06/2004   left, with port placement  . [place]   x2  . COLONOSCOPY  09/2013   Dr. Collene Mares  . LAPAROSCOPIC CHOLECYSTECTOMY  2003  . MOUTH SURGERY  11/15   implant/crown placed 4/16  . SHOULDER ARTHROSCOPY  10/2005   right  . SHOULDER SURGERY  12/15   left  . TONGUE SURGERY  2012, 2013  . WRIST SURGERY  1988   right, [place]   Social  History   Socioeconomic History  . Marital status: Married    Spouse name: Not on file  . Number of children: 2  . Years of education: MD  . Highest education level: Not on file  Occupational History    Employer: [employer]  Tobacco Use  . Smoking status: Never Smoker  . Smokeless tobacco: Never Used  Substance and Sexual Activity  . Alcohol use: Yes    Alcohol/week: 7.0 standard drinks    Types: 7 Standard drinks or equivalent per week  . Drug use: No  . Sexual activity: Not Currently    Partners: Male    Birth control/protection: Post-menopausal    Comment:  husband vasectomy  Other Topics Concern  . Not on file  Social History Narrative   Lives at home w/ husband and daughter   Retired OB/GYN physician   Patient is right handed.   Patient drinks 2 cups of caffeine daily.   Social Determinants of Health   Financial Resource Strain:   . Difficulty of Paying Living Expenses:   Food Insecurity:   . Worried About Charity fundraiser in the Last Year:   . Arboriculturist in the Last Year:   Transportation Needs:   . Film/video editor (Medical):   Marland Kitchen Lack of Transportation (Non-Medical):   Physical Activity:   . Days of Exercise per Week:   . Minutes of Exercise per Session:   Stress:   . Feeling of Stress :   Social Connections:   . Frequency of Communication with Friends and Family:   . Frequency of Social Gatherings with Friends and Family:   . Attends Religious Services:   . Active Member of Clubs or Organizations:   . Attends Archivist Meetings:   Marland Kitchen Marital Status:    Allergies  Allergen Reactions  . Augmentin [Amoxicillin-Pot Clavulanate]     abd cramping  . Cephalexin     Profuse diarrhea  . Crestor [Rosuvastatin]     Liver function elevated    Family History  Problem Relation Age of Onset  . Cancer Mother        melanoma  . Cancer Father        colon  . Prostate cancer Father   . Asthma Brother   . Asthma Sister   . Breast cancer Neg Hx      Current Outpatient Medications (Cardiovascular):  .  nebivolol (BYSTOLIC) 10 MG tablet, Take 10 mg by mouth daily. .  Omega-3-acid Ethyl Esters (LOVAZA PO), Take by mouth. 2G twice daily     Current Outpatient Medications (Other):  .  clonazePAM (KLONOPIN) 1 MG tablet, Take 1 tablet (1 mg total) by mouth at bedtime as needed for anxiety. .  famciclovir (FAMVIR) 500 MG tablet, 3 tablets (1518m) at onset of fever blister symptoms .  famotidine (PEPCID) 10 MG tablet, Take 10 mg by mouth daily. .  Multiple Vitamins-Minerals (MULTIVITAMIN PO), Take 1  tablet by mouth daily. With calcium .  Probiotic Product (ALIGN PO), Take 1 tablet by mouth daily. .  Vitamin D, Ergocalciferol, (DRISDOL) 1.25 MG (50000 UNIT) CAPS capsule, Take 1 capsule (50,000 Units total) by mouth every 7 (seven) days.   Reviewed prior external information including notes and imaging from  primary care provider As well as notes that were available from care everywhere and other healthcare systems.  Past medical history, social, surgical and family history all reviewed in electronic medical record.  No pertanent information unless stated  regarding to the chief complaint.   Review of Systems:  No headache, visual changes, nausea, vomiting, diarrhea, constipation, dizziness, abdominal pain, skin rash, fevers, chills, night sweats, weight loss, swollen lymph nodes, body aches, joint swelling, chest pain, shortness of breath, mood changes. POSITIVE muscle aches  Objective  Blood pressure (!) 164/90, pulse 63, height 5' 3"  (1.6 m), weight 151 lb (68.5 kg), last menstrual period 06/26/2004, SpO2 98 %.   General: No apparent distress alert and oriented x3 mood and affect normal, dressed appropriately.  HEENT: Pupils equal, extraocular movements intact  Respiratory: Patient's speak in full sentences and does not appear short of breath  Cardiovascular: No lower extremity edema, non tender, no erythema  Skin: Warm dry intact with no signs of infection or rash on extremities or on axial skeleton.   Gait normal with good balance and coordination.  MSK: Right hip still shows the patient does have significant tightness noted with FABER test.  Negative straight leg test.  Neurovascularly intact distally.     Impression and Recommendations:     The above documentation has been reviewed and is accurate and complete Lyndal Pulley, DO       Note: This dictation was prepared with Dragon dictation along with smaller phrase technology. Any transcriptional errors that result from  this process are unintentional.

## 2020-02-05 ENCOUNTER — Encounter: Payer: Self-pay | Admitting: Obstetrics & Gynecology

## 2020-02-06 ENCOUNTER — Telehealth: Payer: Self-pay | Admitting: *Deleted

## 2020-02-06 ENCOUNTER — Other Ambulatory Visit: Payer: Self-pay | Admitting: Obstetrics & Gynecology

## 2020-02-06 MED ORDER — OMEGA-3-ACID ETHYL ESTERS 1 G PO CAPS: ORAL_CAPSULE | ORAL | 1 refills | Status: DC

## 2020-02-06 MED ORDER — NEBIVOLOL HCL 10 MG PO TABS: 10.0000 mg | ORAL_TABLET | Freq: Every day | ORAL | 1 refills | Status: DC

## 2020-02-06 NOTE — Telephone Encounter (Signed)
Last AEX 11/02/19  Routing MyChart message to Dr. Sabra Heck to review Rx request and advise.

## 2020-02-06 NOTE — Telephone Encounter (Signed)
Prescription have been done for 90 day supply with 1 RF.  I don't know if Dr. Scarlette Calico is taking new patients but I think she should see if can make appt with him.  He is internal medicine and we discussed this over having a family practice provider.  Thanks.

## 2020-02-06 NOTE — Telephone Encounter (Signed)
Spoke with patient, advised per Dr. Sabra Heck. Patient verbalizes understanding and is agreeable.   Contact information provided. Therapist, music at [facility], [place] [address] [phone]  Encounter closed.

## 2020-02-06 NOTE — Telephone Encounter (Signed)
[name]  Phone Number: [phone]  When I was there for my annual exam, Jessica Mack agreed to refill my Bystolic (10 mg. daily) and generic Lovaza (1 gm/2 caps BID) until I found a new primary care doctor. She was going to check out the new docs at the [place] near your office and give me a suggestion. I just realized I am almost out of my meds so would love a 90 day refill to be called in to [facility] on [address]. I would also like her recommendation for a primary care physician so I can make an appointment. Thanks so much!

## 2020-02-14 ENCOUNTER — Telehealth: Payer: Self-pay | Admitting: Endocrinology

## 2020-02-14 NOTE — Telephone Encounter (Signed)
New message:  LVM with patient to let her know Dr. Ronnald Ramp has agreed to take her on as a new patient. Pls schedule upon return call of patient

## 2020-02-19 ENCOUNTER — Telehealth: Payer: Self-pay

## 2020-02-19 NOTE — Telephone Encounter (Signed)
New message    The patient calling asking for fasting lab order prior to appt in July.

## 2020-02-19 NOTE — Telephone Encounter (Signed)
Patient has a TOC appointment with you in July.   Okay for lab orders prior to appointment?

## 2020-02-20 ENCOUNTER — Ambulatory Visit: Payer: 59 | Admitting: Family Medicine

## 2020-02-20 ENCOUNTER — Other Ambulatory Visit: Payer: Self-pay | Admitting: Internal Medicine

## 2020-02-20 DIAGNOSIS — I10 Essential (primary) hypertension: Secondary | ICD-10-CM | POA: Insufficient documentation

## 2020-02-20 DIAGNOSIS — Z0000 Encounter for general adult medical examination without abnormal findings: Secondary | ICD-10-CM | POA: Insufficient documentation

## 2020-02-20 DIAGNOSIS — Z1159 Encounter for screening for other viral diseases: Secondary | ICD-10-CM

## 2020-02-21 NOTE — Telephone Encounter (Signed)
Pt informed labs have been ordered. She will go about a week in advanced.

## 2020-05-14 ENCOUNTER — Ambulatory Visit: Payer: 59 | Admitting: Internal Medicine

## 2020-05-23 ENCOUNTER — Other Ambulatory Visit: Payer: Self-pay | Admitting: Obstetrics & Gynecology

## 2020-05-23 DIAGNOSIS — F419 Anxiety disorder, unspecified: Secondary | ICD-10-CM

## 2020-05-23 NOTE — Telephone Encounter (Signed)
Medication refill request: Clonazepam 32m  Last AEX:  11/02/19 Next AEX: 11/05/20 Last MMG (if hormonal medication request): 09/27/19  Normal  Refill authorized: 90/0

## 2020-05-30 ENCOUNTER — Encounter: Payer: Self-pay | Admitting: Neurology

## 2020-05-30 ENCOUNTER — Ambulatory Visit (INDEPENDENT_AMBULATORY_CARE_PROVIDER_SITE_OTHER): Payer: 59 | Admitting: Neurology

## 2020-05-30 VITALS — BP 131/80 | HR 55 | Ht 63.5 in | Wt 151.5 lb

## 2020-05-30 DIAGNOSIS — G620 Drug-induced polyneuropathy: Secondary | ICD-10-CM | POA: Diagnosis not present

## 2020-05-30 NOTE — Progress Notes (Signed)
Reason for visit: Peripheral neuropathy  Jessica Mack is an 64 y.o. female  History of present illness:  Jessica Mack is a 64 year old right-handed white female with a history of a Taxol induced neuropathy.  The patient has had chronic issues with neuropathic pain that is worse in the evening hours.  She has some mild gait instability, but no recent falls.  She has had some arthritis in her hands but also some decline in her ability to perform fine motor tasks.  The patient is trying to exercise on a regular basis and work on balance issues.  She takes clonazepam at night to help her rest.  She is no longer having a lot of troubles with leg cramps at night.  She has been treated for a right piriformis syndrome, she is on turmeric and vitamin D.  She comes to this office for an evaluation.  Past Medical History:  Diagnosis Date  . Avascular necrosis of bone (HCC)    Left talus  . Blood transfusion   . Breast cancer ([place]) 2005   left  . Cancer ([place])   . Dysplastic nevus 1988   right hip  . GERD (gastroesophageal reflux disease)   . History of breast cancer 2005   invasive ductal cancer left breast  . Hypertension   . Hypertriglyceridemia   . Irritable bowel syndrome   . Metabolic syndrome    resolved with diet and exercise  . Migraines   . Nocturnal leg cramps 02/14/2015  . Osteopenia   . Palpitations 12/13   normal echo  . Peripheral neuropathy   . Personal history of chemotherapy   . Personal history of radiation therapy   . Plantar fasciitis 6/04  . Tongue dysplasia   . Ulcer     Past Surgical History:  Procedure Laterality Date  . ANKLE SURGERY  07/2007   left, revascularization  . BREAST LUMPECTOMY  06/2004   left, with port placement  . [place]   x2  . COLONOSCOPY  09/2013   Dr. Collene Mares  . LAPAROSCOPIC CHOLECYSTECTOMY  2003  . MOUTH SURGERY  11/15   implant/crown placed 4/16  . SHOULDER ARTHROSCOPY  10/2005   right  . SHOULDER SURGERY  12/15    left  . TONGUE SURGERY  2012, 2013  . WRIST SURGERY  1988   right, [place]    Family History  Problem Relation Age of Onset  . Cancer Mother        melanoma  . Cancer Father        colon  . Prostate cancer Father   . Asthma Brother   . Asthma Sister   . Breast cancer Neg Hx     Social history:  reports that she has never smoked. She has never used smokeless tobacco. She reports current alcohol use of about 7.0 standard drinks of alcohol per week. She reports that she does not use drugs.    Allergies  Allergen Reactions  . Augmentin [Amoxicillin-Pot Clavulanate]     abd cramping  . Cephalexin     Profuse diarrhea  . Crestor [Rosuvastatin]     Liver function elevated     Medications:  Prior to Admission medications   Medication Sig Start Date End Date Taking? Authorizing Provider  clonazePAM (KLONOPIN) 1 MG tablet TAKE 1 TABLET(1 MG) BY MOUTH AT BEDTIME AS NEEDED FOR ANXIETY 05/28/20  Yes Megan Salon, MD  famciclovir (FAMVIR) 500 MG tablet 3 tablets (1557m) at  onset of fever blister symptoms 10/28/18  Yes Megan Salon, MD  famotidine (PEPCID) 10 MG tablet Take 10 mg by mouth daily.   Yes [provider]  Multiple Vitamins-Minerals (MULTIVITAMIN PO) Take 1 tablet by mouth daily. With calcium   Yes [provider]  nebivolol (BYSTOLIC) 10 MG tablet Take 1 tablet (10 mg total) by mouth daily. 02/06/20  Yes Megan Salon, MD  omega-3 acid ethyl esters (LOVAZA) 1 g capsule 2 grams (2 capsules) BID 02/06/20  Yes Megan Salon, MD  Probiotic Product (ALIGN PO) Take 1 tablet by mouth daily.   Yes [provider]  Turmeric 500 MG CAPS daily. 12/04/19  Yes [provider]    ROS:  Out of a complete 14 system review of symptoms, the patient complains only of the following symptoms, and all other reviewed systems are negative.  Gait disturbance Arthritis  Blood pressure 131/80, pulse (!) 55, height 5' 3.5" (1.613 m), weight 151 lb 8 oz  (68.7 kg), last menstrual period 06/26/2004.  Physical Exam  General: The patient is alert and cooperative at the time of the examination.  Skin: No significant peripheral edema is noted.   Neurologic Exam  Mental status: The patient is alert and oriented x 3 at the time of the examination. The patient has apparent normal recent and remote memory, with an apparently normal attention span and concentration ability.   Cranial nerves: Facial symmetry is present. Speech is normal, no aphasia or dysarthria is noted. Extraocular movements are full. Visual fields are full.  Motor: The patient has good strength in all 4 extremities.  Sensory examination: Soft touch sensation is symmetric on the face, arms, and legs.  Coordination: The patient has good finger-nose-finger and heel-to-shin bilaterally.  Gait and station: The patient has a normal gait. Tandem gait is minimally unsteady. Romberg is negative. No drift is seen.  Reflexes: Deep tendon reflexes are symmetric, but are depressed.   Assessment/Plan:  1.  Taxol-induced neuropathy  The patient is doing relatively well at this time, she will try CBD oil for the arthritis, sleep issues, and neuropathy.  She may go on alpha lipoic acid.  She will follow-up here in 1 year.  Jill Alexanders MD 05/30/2020 8:19 AM  [facility] [address] [place], [place] [address]  Phone [phone] Fax [phone]

## 2020-05-30 NOTE — Patient Instructions (Signed)
May try CBD oil for the arthritis, sleep issues and for the neuropathy pain.  May try alpha lipoic acid for the neuropathy.

## 2020-05-31 ENCOUNTER — Other Ambulatory Visit: Payer: Self-pay | Admitting: Obstetrics & Gynecology

## 2020-05-31 ENCOUNTER — Other Ambulatory Visit: Payer: 59

## 2020-05-31 DIAGNOSIS — I10 Essential (primary) hypertension: Secondary | ICD-10-CM

## 2020-05-31 DIAGNOSIS — Z0000 Encounter for general adult medical examination without abnormal findings: Secondary | ICD-10-CM

## 2020-05-31 DIAGNOSIS — Z1159 Encounter for screening for other viral diseases: Secondary | ICD-10-CM

## 2020-05-31 DIAGNOSIS — Z1231 Encounter for screening mammogram for malignant neoplasm of breast: Secondary | ICD-10-CM

## 2020-05-31 LAB — BASIC METABOLIC PANEL
BUN: 16 mg/dL (ref 6–23)
CO2: 27 mEq/L (ref 19–32)
Calcium: 9.8 mg/dL (ref 8.4–10.5)
Chloride: 107 mEq/L (ref 96–112)
Creatinine, Ser: 0.74 mg/dL (ref 0.40–1.20)
GFR: 79.55 mL/min (ref >60.00)
Glucose, Bld: 102 mg/dL — ABNORMAL HIGH (ref 70–99)
Potassium: 4.1 mEq/L (ref 3.5–5.1)
Sodium: 140 mEq/L (ref 135–145)

## 2020-05-31 LAB — CBC WITH DIFFERENTIAL/PLATELET
Basophils Absolute: 0.1 10*3/uL (ref 0.0–0.1)
Basophils Relative: 1.1 % (ref 0.0–3.0)
Eosinophils Absolute: 0.2 10*3/uL (ref 0.0–0.7)
Eosinophils Relative: 3.4 % (ref 0.0–5.0)
HCT: 42.7 % (ref 36.0–46.0)
Hemoglobin: 14.6 g/dL (ref 12.0–15.0)
Lymphocytes Relative: 41.4 % (ref 12.0–46.0)
Lymphs Abs: 2 10*3/uL (ref 0.7–4.0)
MCHC: 34.2 g/dL (ref 30.0–36.0)
MCV: 95.5 fl (ref 78.0–100.0)
Monocytes Absolute: 0.6 10*3/uL (ref 0.1–1.0)
Monocytes Relative: 12 % (ref 3.0–12.0)
Neutro Abs: 2 10*3/uL (ref 1.4–7.7)
Neutrophils Relative %: 42.1 % — ABNORMAL LOW (ref 43.0–77.0)
Platelets: 191 10*3/uL (ref 150.0–400.0)
RBC: 4.48 Mil/uL (ref 3.87–5.11)
RDW: 12.1 % (ref 11.5–15.5)
WBC: 4.8 10*3/uL (ref 4.0–10.5)

## 2020-05-31 LAB — LDL CHOLESTEROL, DIRECT: Direct LDL: 116 mg/dL

## 2020-05-31 LAB — HEPATIC FUNCTION PANEL
ALT: 61 U/L — ABNORMAL HIGH (ref 0–35)
AST: 38 U/L — ABNORMAL HIGH (ref 0–37)
Albumin: 4.7 g/dL (ref 3.5–5.2)
Alkaline Phosphatase: 54 U/L (ref 39–117)
Bilirubin, Direct: 0.1 mg/dL (ref 0.0–0.3)
Total Bilirubin: 0.5 mg/dL (ref 0.2–1.2)
Total Protein: 7.2 g/dL (ref 6.0–8.3)

## 2020-05-31 LAB — LIPID PANEL
Cholesterol: 204 mg/dL — ABNORMAL HIGH (ref 0–200)
HDL: 39.5 mg/dL (ref >39.00)
NonHDL: 164.17
Total CHOL/HDL Ratio: 5
Triglycerides: 224 mg/dL — ABNORMAL HIGH (ref 0.0–149.0)
VLDL: 44.8 mg/dL — ABNORMAL HIGH (ref 0.0–40.0)

## 2020-05-31 LAB — URINALYSIS, ROUTINE W REFLEX MICROSCOPIC
Bilirubin Urine: NEGATIVE
Hgb urine dipstick: NEGATIVE
Ketones, ur: NEGATIVE
Nitrite: NEGATIVE
RBC / HPF: NONE SEEN (ref 0–?)
Specific Gravity, Urine: 1.025 (ref 1.000–1.030)
Total Protein, Urine: NEGATIVE
Urine Glucose: NEGATIVE
Urobilinogen, UA: 0.2 (ref 0.0–1.0)
pH: 5 (ref 5.0–8.0)

## 2020-05-31 LAB — TSH: TSH: 2.41 u[IU]/mL (ref 0.35–4.50)

## 2020-06-03 LAB — HEPATITIS C ANTIBODY
Hepatitis C Ab: NONREACTIVE
SIGNAL TO CUT-OFF: 0.01 (ref <1.00)

## 2020-06-03 LAB — HIV ANTIBODY (ROUTINE TESTING W REFLEX): HIV 1&2 Ab, 4th Generation: NONREACTIVE

## 2020-06-04 ENCOUNTER — Other Ambulatory Visit: Payer: Self-pay

## 2020-06-04 ENCOUNTER — Encounter: Payer: Self-pay | Admitting: Internal Medicine

## 2020-06-04 ENCOUNTER — Ambulatory Visit: Payer: 59 | Admitting: Internal Medicine

## 2020-06-04 VITALS — BP 124/74 | HR 60 | Temp 98.1°F | Resp 16 | Ht 63.5 in | Wt 151.0 lb

## 2020-06-04 DIAGNOSIS — Z0000 Encounter for general adult medical examination without abnormal findings: Secondary | ICD-10-CM | POA: Diagnosis not present

## 2020-06-04 DIAGNOSIS — G620 Drug-induced polyneuropathy: Secondary | ICD-10-CM

## 2020-06-04 DIAGNOSIS — F419 Anxiety disorder, unspecified: Secondary | ICD-10-CM

## 2020-06-04 DIAGNOSIS — K7581 Nonalcoholic steatohepatitis (NASH): Secondary | ICD-10-CM | POA: Diagnosis not present

## 2020-06-04 DIAGNOSIS — I10 Essential (primary) hypertension: Secondary | ICD-10-CM

## 2020-06-04 DIAGNOSIS — E781 Pure hyperglyceridemia: Secondary | ICD-10-CM | POA: Insufficient documentation

## 2020-06-04 DIAGNOSIS — Z23 Encounter for immunization: Secondary | ICD-10-CM | POA: Diagnosis not present

## 2020-06-04 MED ORDER — NEBIVOLOL HCL 10 MG PO TABS: 10.0000 mg | ORAL_TABLET | Freq: Every day | ORAL | 1 refills | Status: DC

## 2020-06-04 MED ORDER — PIOGLITAZONE HCL 15 MG PO TABS: 15.0000 mg | ORAL_TABLET | Freq: Every day | ORAL | 1 refills | Status: DC

## 2020-06-04 MED ORDER — OMEGA-3-ACID ETHYL ESTERS 1 G PO CAPS: ORAL_CAPSULE | ORAL | 1 refills | Status: DC

## 2020-06-04 MED ORDER — CLONAZEPAM 1 MG PO TABS: ORAL_TABLET | ORAL | 1 refills | Status: DC

## 2020-06-04 NOTE — Progress Notes (Signed)
Subjective:  Patient ID: Jessica Mack, female    DOB: 1956/07/05  Age: 64 y.o. MRN: [id number]  CC: Annual Exam, Hypertension, and Hyperlipidemia  This visit occurred during the SARS-CoV-2 public health emergency.  Safety protocols were in place, including screening questions prior to the visit, additional usage of staff PPE, and extensive cleaning of exam room while observing appropriate contact time as indicated for disinfecting solutions.    HPI WLADYSLAWA DISBRO presents for a CPX.  She walks about 3 miles a day and does not experience chest pain, shortness of breath, palpitations, edema, or fatigue.  She tells me her blood pressure has been well controlled.  She has trouble sleeping due to symptoms related to neuropathy and requests a refill of clonazepam.  She has a history of NASH.  She has made significant progress on her lifestyle modifications.  She denies any recent episodes of abdominal pain, nausea, vomiting, icterus, or loss of appetite.  Outpatient Medications Prior to Visit  Medication Sig Dispense Refill  . famciclovir (FAMVIR) 500 MG tablet 3 tablets (1532m) at onset of fever blister symptoms 15 tablet 1  . famotidine (PEPCID) 10 MG tablet Take 10 mg by mouth daily.    . Multiple Vitamins-Minerals (MULTIVITAMIN PO) Take 1 tablet by mouth daily. With calcium    . Probiotic Product (ALIGN PO) Take 1 tablet by mouth daily.    . Turmeric 500 MG CAPS daily.    . clonazePAM (KLONOPIN) 1 MG tablet TAKE 1 TABLET(1 MG) BY MOUTH AT BEDTIME AS NEEDED FOR ANXIETY 90 tablet 0  . nebivolol (BYSTOLIC) 10 MG tablet Take 1 tablet (10 mg total) by mouth daily. 90 tablet 1  . omega-3 acid ethyl esters (LOVAZA) 1 g capsule 2 grams (2 capsules) BID 360 capsule 1   No facility-administered medications prior to visit.    ROS Review of Systems  Constitutional: Negative for appetite change, diaphoresis, fatigue and unexpected weight change.  HENT: Negative.   Eyes: Negative.   Respiratory:  Negative for cough, chest tightness, shortness of breath and wheezing.   Cardiovascular: Negative for chest pain, palpitations and leg swelling.  Gastrointestinal: Negative for abdominal pain, constipation, diarrhea, nausea and vomiting.  Endocrine: Negative.   Genitourinary: Negative.  Negative for difficulty urinating.  Musculoskeletal: Negative for arthralgias and myalgias.  Skin: Negative.   Neurological: Positive for numbness. Negative for dizziness, weakness and headaches.       Burning in extremities  Hematological: Negative for adenopathy. Does not bruise/bleed easily.  Psychiatric/Behavioral: Negative.     Objective:  BP 124/74 (BP Location: Left Arm, Patient Position: Sitting, Cuff Size: Large)   Pulse 60   Temp 98.1 F (36.7 C) (Oral)   Resp 16   Ht 5' 3.5" (1.613 m)   Wt 151 lb (68.5 kg)   LMP 06/26/2004   SpO2 99%   BMI 26.33 kg/m   BP Readings from Last 3 Encounters:  06/04/20 124/74  05/30/20 131/80  01/11/20 (!) 164/90    Wt Readings from Last 3 Encounters:  06/04/20 151 lb (68.5 kg)  05/30/20 151 lb 8 oz (68.7 kg)  01/11/20 151 lb (68.5 kg)    Physical Exam Vitals reviewed.  Constitutional:      Appearance: Normal appearance.  HENT:     Nose: Nose normal.     Mouth/Throat:     Mouth: Mucous membranes are moist.  Eyes:     General: No scleral icterus.    Conjunctiva/sclera: Conjunctivae normal.  Cardiovascular:  Rate and Rhythm: Normal rate and regular rhythm.     Heart sounds: No murmur heard.   Pulmonary:     Breath sounds: No stridor. No wheezing, rhonchi or rales.  Abdominal:     General: Abdomen is flat.     Palpations: There is no hepatomegaly, splenomegaly or mass.     Tenderness: There is no abdominal tenderness. There is no guarding.  Musculoskeletal:        General: Normal range of motion.     Cervical back: Neck supple.     Right lower leg: No edema.     Left lower leg: No edema.  Lymphadenopathy:     Cervical: No cervical  adenopathy.  Skin:    General: Skin is warm and dry.  Neurological:     General: No focal deficit present.     Mental Status: She is alert.  Psychiatric:        Mood and Affect: Mood normal.        Behavior: Behavior normal.     Lab Results  Component Value Date   WBC 4.8 05/31/2020   HGB 14.6 05/31/2020   HCT 42.7 05/31/2020   PLT 191.0 05/31/2020   GLUCOSE 102 (H) 05/31/2020   CHOL 204 (H) 05/31/2020   TRIG 224.0 (H) 05/31/2020   HDL 39.50 05/31/2020   LDLDIRECT 116.0 05/31/2020   LDLCALC 61 05/17/2014   ALT 61 (H) 05/31/2020   AST 38 (H) 05/31/2020   NA 140 05/31/2020   K 4.1 05/31/2020   CL 107 05/31/2020   CREATININE 0.74 05/31/2020   BUN 16 05/31/2020   CO2 27 05/31/2020   TSH 2.41 05/31/2020   HGBA1C 5.1 07/17/2011    MM 3D SCREEN BREAST BILATERAL  Result Date: 09/27/2019 CLINICAL DATA:  Screening. History of LEFT breast cancer and lumpectomy in 2005. EXAM: DIGITAL SCREENING BILATERAL MAMMOGRAM WITH TOMO AND CAD COMPARISON:  Previous exam(s). ACR Breast Density Category b: There are scattered areas of fibroglandular density. FINDINGS: There are no findings suspicious for malignancy. LEFT lumpectomy changes again noted. Images were processed with CAD. IMPRESSION: No mammographic evidence of malignancy. A result letter of this screening mammogram will be mailed directly to the patient. RECOMMENDATION: Screening mammogram in one year. (Code:SM-B-01Y) BI-RADS CATEGORY  2: Benign. Electronically Signed   By: Margarette Canada M.D.   On: 09/27/2019 14:48    Assessment & Plan:   Mayuri was seen today for annual exam, hypertension and hyperlipidemia.  Diagnoses and all orders for this visit:  NASH (nonalcoholic steatohepatitis)- She has made significant improvement in her lifestyle modifications yet her LFTs are still mildly elevated.  I recommended that she take pioglitazone to reduce the risk of developing cirrhosis. -     pioglitazone (ACTOS) 15 MG tablet; Take 1 tablet (15  mg total) by mouth daily.  Routine general medical examination at a health care facility- Exam completed, labs reviewed-she has a low ASCVD risk score so I did not recommend a statin for CV risk reduction, vaccines reviewed and updated, cancer screenings are up-to-date, patient education material was given.  Drug-induced polyneuropathy (HCC) -     clonazePAM (KLONOPIN) 1 MG tablet; TAKE 1 TABLET(1 MG) BY MOUTH AT BEDTIME AS NEEDED FOR ANXIETY  Hypertriglyceridemia- She has not been taking the Lovaza twice a day and her triglycerides are not adequately well controlled.  She was encouraged to take the Lovaza as directed. -     omega-3 acid ethyl esters (LOVAZA) 1 g capsule; 2 grams (2  capsules) BID -     Discontinue: pioglitazone (ACTOS) 15 MG tablet; Take 1 tablet (15 mg total) by mouth daily.  Essential hypertension- Her blood pressure is adequately well controlled. -     nebivolol (BYSTOLIC) 10 MG tablet; Take 1 tablet (10 mg total) by mouth daily.  Anxiety  Need for pneumococcal vaccination -     Pneumococcal polysaccharide vaccine 23-valent greater than or equal to 2yo subcutaneous/IM   I have discontinued Jeani Hawking E. Bulow's pioglitazone. I am also having her start on pioglitazone. Additionally, I am having her maintain her Multiple Vitamins-Minerals (MULTIVITAMIN PO), Probiotic Product (ALIGN PO), famciclovir, famotidine, Turmeric, clonazePAM, omega-3 acid ethyl esters, and nebivolol.  Meds ordered this encounter  Medications  . clonazePAM (KLONOPIN) 1 MG tablet    Sig: TAKE 1 TABLET(1 MG) BY MOUTH AT BEDTIME AS NEEDED FOR ANXIETY    Dispense:  90 tablet    Refill:  1  . omega-3 acid ethyl esters (LOVAZA) 1 g capsule    Sig: 2 grams (2 capsules) BID    Dispense:  360 capsule    Refill:  1  . nebivolol (BYSTOLIC) 10 MG tablet    Sig: Take 1 tablet (10 mg total) by mouth daily.    Dispense:  90 tablet    Refill:  1  . DISCONTD: pioglitazone (ACTOS) 15 MG tablet    Sig: Take 1  tablet (15 mg total) by mouth daily.    Dispense:  90 tablet    Refill:  1  . pioglitazone (ACTOS) 15 MG tablet    Sig: Take 1 tablet (15 mg total) by mouth daily.    Dispense:  90 tablet    Refill:  1     Follow-up: Return in about 6 months (around 12/05/2020).  Scarlette Calico, MD

## 2020-06-04 NOTE — Patient Instructions (Signed)
Fatty Liver Disease  Fatty liver disease occurs when too much fat has built up in your liver cells. Fatty liver disease is also called hepatic steatosis or steatohepatitis. The liver removes harmful substances from your bloodstream and produces fluids that your body needs. It also helps your body use and store energy from the food you eat. In many cases, fatty liver disease does not cause symptoms or problems. It is often diagnosed when tests are being done for other reasons. However, over time, fatty liver can cause inflammation that may lead to more serious liver problems, such as scarring of the liver (cirrhosis) and liver failure. Fatty liver is associated with insulin resistance, increased body fat, high blood pressure (hypertension), and high cholesterol. These are features of metabolic syndrome and increase your risk for stroke, diabetes, and heart disease. What are the causes? This condition may be caused by:  Drinking too much alcohol.  Poor nutrition.  Obesity.  Cushing's syndrome.  Diabetes.  High cholesterol.  Certain drugs.  Poisons.  Some viral infections.  Pregnancy. What increases the risk? You are more likely to develop this condition if you:  Abuse alcohol.  Are overweight.  Have diabetes.  Have hepatitis.  Have a high triglyceride level.  Are pregnant. What are the signs or symptoms? Fatty liver disease often does not cause symptoms. If symptoms do develop, they can include:  Fatigue.  Weakness.  Weight loss.  Confusion.  Abdominal pain.  Nausea and vomiting.  Yellowing of your skin and the white parts of your eyes (jaundice).  Itchy skin. How is this diagnosed? This condition may be diagnosed by:  A physical exam and medical history.  Blood tests.  Imaging tests, such as an ultrasound, CT scan, or MRI.  A liver biopsy. A small sample of liver tissue is removed using a needle. The sample is then looked at under a microscope. How  is this treated? Fatty liver disease is often caused by other health conditions. Treatment for fatty liver may involve medicines and lifestyle changes to manage conditions such as:  Alcoholism.  High cholesterol.  Diabetes.  Being overweight or obese. Follow these instructions at home:   Do not drink alcohol. If you have trouble quitting, ask your health care provider how to safely quit with the help of medicine or a supervised program. This is important to keep your condition from getting worse.  Eat a healthy diet as told by your health care provider. Ask your health care provider about working with a diet and nutrition specialist (dietitian) to develop an eating plan.  Exercise regularly. This can help you lose weight and control your cholesterol and diabetes. Talk to your health care provider about an exercise plan and which activities are best for you.  Take over-the-counter and prescription medicines only as told by your health care provider.  Keep all follow-up visits as told by your health care provider. This is important. Contact a health care provider if: You have trouble controlling your:  Blood sugar. This is especially important if you have diabetes.  Cholesterol.  Drinking of alcohol. Get help right away if:  You have abdominal pain.  You have jaundice.  You have nausea and vomiting.  You vomit blood or material that looks like coffee grounds.  You have stools that are black, tar-like, or bloody. Summary  Fatty liver disease develops when too much fat builds up in the cells of your liver.  Fatty liver disease often causes no symptoms or problems. However, over   time, fatty liver can cause inflammation that may lead to more serious liver problems, such as scarring of the liver (cirrhosis).  You are more likely to develop this condition if you abuse alcohol, are pregnant, are overweight, have diabetes, have hepatitis, or have high triglyceride  levels.  Contact your health care provider if you have trouble controlling your weight, blood sugar, cholesterol, or drinking of alcohol. This information is not intended to replace advice given to you by your health care provider. Make sure you discuss any questions you have with your health care provider. Document Revised: 09/24/2017 Document Reviewed: 07/21/2017 Elsevier Patient Education  2020 Elsevier Inc.  

## 2020-06-05 MED ORDER — PIOGLITAZONE HCL 15 MG PO TABS: 15.0000 mg | ORAL_TABLET | Freq: Every day | ORAL | 1 refills | Status: DC

## 2020-07-23 ENCOUNTER — Ambulatory Visit: Payer: 59 | Attending: Internal Medicine

## 2020-07-23 DIAGNOSIS — Z23 Encounter for immunization: Secondary | ICD-10-CM

## 2020-07-23 NOTE — Progress Notes (Signed)
   Covid-19 Vaccination Clinic  Name:  Jessica Mack    MRN: [id number] DOB: 09/03/1956  07/23/2020  [name] was observed post Covid-19 immunization for 15 minutes without incident. She was provided with Vaccine Information Sheet and instruction to access the V-Safe system.   [name] was instructed to call 911 with any severe reactions post vaccine: Marland Kitchen Difficulty breathing  . Swelling of face and throat  . A fast heartbeat  . A bad rash all over body  . Dizziness and weakness

## 2020-08-19 DIAGNOSIS — Z0289 Encounter for other administrative examinations: Secondary | ICD-10-CM

## 2020-09-27 ENCOUNTER — Other Ambulatory Visit: Payer: Self-pay

## 2020-09-27 ENCOUNTER — Ambulatory Visit
Admission: RE | Admit: 2020-09-27 | Discharge: 2020-09-27 | Disposition: A | Discharge status: Home / Self Care | Payer: 59 | Source: Ambulatory Visit | Attending: Obstetrics & Gynecology | Admitting: Obstetrics & Gynecology

## 2020-09-27 DIAGNOSIS — M8580 Other specified disorders of bone density and structure, unspecified site: Secondary | ICD-10-CM

## 2020-09-27 DIAGNOSIS — Z1231 Encounter for screening mammogram for malignant neoplasm of breast: Secondary | ICD-10-CM

## 2020-09-27 LAB — HM MAMMOGRAPHY

## 2020-09-28 ENCOUNTER — Encounter: Payer: Self-pay | Admitting: Obstetrics and Gynecology

## 2020-09-30 ENCOUNTER — Other Ambulatory Visit: Payer: Self-pay

## 2020-09-30 NOTE — Telephone Encounter (Signed)
AEX 11/02/2019 with [facility] P[facility]  H/o breast cancer 06/2004, Vaginal atrophy  Routing to Dr Quincy Simmonds, please advise

## 2020-09-30 NOTE — Telephone Encounter (Signed)
Pt sent the following mychart message:  [place], [facility] Dr. Sabra Heck had previously prescribed Vagifem for me that I was not using but said I could get a new Rx any time if I changed my mind. I have restarted 10 mcg tabs intravag twice weekly from a supply that I already had but only have a week supply left. I have met my deductible for the year so would love to get a 90 day supply of this Rx ([facility]) before 10/25/20. I have an appointment with Dr. Quincy Simmonds on 01/23/21. Many thanks. Call for any questions. [phone]  Margaretha Glassing, MD

## 2020-10-01 MED ORDER — ESTRADIOL 10 MCG VA TABS: ORAL_TABLET | VAGINAL | 1 refills | Status: DC

## 2020-10-01 NOTE — Telephone Encounter (Signed)
Left detailed messaged per DPR with update on sent Rx.  Pt to return call with any questions or concerns. Encounter closed

## 2020-10-01 NOTE — Telephone Encounter (Signed)
Spoke with pt. Pt states has been using Vagifem 10 mcg tablets twice weekly since Oct. Pt states was last prescribed by Dr Sabra Heck. Pt states was cleared x 6 years ago from Dr Jana Hakim and had recent MMG on 12/3 Birads, 2 benign. Pt states has not seen Dr Jana Hakim in 6 years since clearance. Pt aware of breast cancer hx and risks of taking Vagifem Rx. Pt still requesting Rx refill. States has 1 tablet left.   Advised will review with Dr Quincy Simmonds and return call. Pt agreeable.   Routing to Dr Quincy Simmonds.  Rx pended if approved, #24, 1 RF to get to [facility] 12/2020 with Dr Quincy Simmonds.

## 2020-10-01 NOTE — Telephone Encounter (Signed)
Please let patient know that I will refill her Vagifem to get her through to her annual exam.

## 2020-10-01 NOTE — Telephone Encounter (Signed)
I have reviewed the request for the vaginal estrogen.  As she has a history of estrogen receptor positive breast cancer, I recommend we reach out to her oncologist for approval before prescribing.  Will you please see who treated her, so we can contact her oncologist?

## 2020-10-22 ENCOUNTER — Other Ambulatory Visit: Payer: Self-pay | Admitting: Internal Medicine

## 2020-10-22 DIAGNOSIS — I10 Essential (primary) hypertension: Secondary | ICD-10-CM

## 2020-10-22 MED ORDER — NEBIVOLOL HCL 10 MG PO TABS: 10.0000 mg | ORAL_TABLET | Freq: Every day | ORAL | 1 refills | Status: DC

## 2020-11-05 ENCOUNTER — Ambulatory Visit: Payer: 59 | Admitting: Obstetrics & Gynecology

## 2020-11-09 DIAGNOSIS — F4320 Adjustment disorder, unspecified: Secondary | ICD-10-CM | POA: Diagnosis not present

## 2020-11-19 ENCOUNTER — Telehealth: Payer: Self-pay | Admitting: Internal Medicine

## 2020-11-19 NOTE — Telephone Encounter (Signed)
Called pt, LVM informing pt to wait for OV for lab work to make sure no additional lab work would be neededthat could potentially have her getting stuck for labs twice.

## 2020-11-19 NOTE — Telephone Encounter (Signed)
Patient wondering if she can get her labs done before her appointment on 03.03.22

## 2020-11-20 DIAGNOSIS — F4320 Adjustment disorder, unspecified: Secondary | ICD-10-CM | POA: Diagnosis not present

## 2020-12-02 ENCOUNTER — Other Ambulatory Visit: Payer: Self-pay | Admitting: Internal Medicine

## 2020-12-02 DIAGNOSIS — K7581 Nonalcoholic steatohepatitis (NASH): Secondary | ICD-10-CM

## 2020-12-03 DIAGNOSIS — F4320 Adjustment disorder, unspecified: Secondary | ICD-10-CM | POA: Diagnosis not present

## 2020-12-05 ENCOUNTER — Ambulatory Visit: Payer: 59 | Admitting: Internal Medicine

## 2020-12-26 ENCOUNTER — Ambulatory Visit: Payer: 59 | Admitting: Internal Medicine

## 2020-12-31 ENCOUNTER — Other Ambulatory Visit: Payer: Self-pay

## 2020-12-31 ENCOUNTER — Encounter: Payer: Self-pay | Admitting: Internal Medicine

## 2020-12-31 ENCOUNTER — Ambulatory Visit (INDEPENDENT_AMBULATORY_CARE_PROVIDER_SITE_OTHER): Payer: BC Managed Care – PPO | Admitting: Internal Medicine

## 2020-12-31 VITALS — BP 138/82 | HR 52 | Temp 97.8°F | Resp 16 | Ht 63.5 in | Wt 162.0 lb

## 2020-12-31 DIAGNOSIS — I10 Essential (primary) hypertension: Secondary | ICD-10-CM | POA: Diagnosis not present

## 2020-12-31 DIAGNOSIS — R001 Bradycardia, unspecified: Secondary | ICD-10-CM | POA: Diagnosis not present

## 2020-12-31 DIAGNOSIS — E781 Pure hyperglyceridemia: Secondary | ICD-10-CM

## 2020-12-31 DIAGNOSIS — K7581 Nonalcoholic steatohepatitis (NASH): Secondary | ICD-10-CM | POA: Diagnosis not present

## 2020-12-31 DIAGNOSIS — T50905A Adverse effect of unspecified drugs, medicaments and biological substances, initial encounter: Secondary | ICD-10-CM

## 2020-12-31 DIAGNOSIS — R7989 Other specified abnormal findings of blood chemistry: Secondary | ICD-10-CM

## 2020-12-31 MED ORDER — NEBIVOLOL HCL 5 MG PO TABS: 5.0000 mg | ORAL_TABLET | Freq: Every day | ORAL | 1 refills | Status: DC

## 2020-12-31 MED ORDER — OLMESARTAN MEDOXOMIL 20 MG PO TABS: 20.0000 mg | ORAL_TABLET | Freq: Every day | ORAL | 1 refills | Status: DC

## 2020-12-31 NOTE — Patient Instructions (Signed)

## 2020-12-31 NOTE — Progress Notes (Signed)
Subjective:  Patient ID: Jessica Mack, female    DOB: 1956/10/06  Age: 65 y.o. MRN: [id number]  CC: Hypertension  This visit occurred during the SARS-CoV-2 public health emergency.  Safety protocols were in place, including screening questions prior to the visit, additional usage of staff PPE, and extensive cleaning of exam room while observing appropriate contact time as indicated for disinfecting solutions.    HPI Jessica Mack presents for f/up -   She tells me her blood pressure has been well controlled.  She is exercising and denies any recent episodes of dizziness, lightheadedness, palpitations, diaphoresis, chest pain, shortness of breath, or edema.  She complains of weight gain.  She says her husband went to rehab for alcoholism about 3 months ago and is now been clean and sober.  She has been stressed by this and is going to recovery groups and attending Al-Anon meetings.  Outpatient Medications Prior to Visit  Medication Sig Dispense Refill  . clonazePAM (KLONOPIN) 1 MG tablet TAKE 1 TABLET(1 MG) BY MOUTH AT BEDTIME AS NEEDED FOR ANXIETY 90 tablet 1  . Estradiol 10 MCG TABS vaginal tablet 1 tablet vaginally twice weekly 24 tablet 1  . famciclovir (FAMVIR) 500 MG tablet 3 tablets (1529m) at onset of fever blister symptoms 15 tablet 1  . famotidine (PEPCID) 10 MG tablet Take 10 mg by mouth daily.    .Marland Kitchenketoconazole (NIZORAL) 2 % shampoo SMARTSIG:5 Milliliter(s) Topical 3 Times a Week    . Multiple Vitamins-Minerals (MULTIVITAMIN PO) Take 1 tablet by mouth daily. With calcium    . omega-3 acid ethyl esters (LOVAZA) 1 g capsule 2 grams (2 capsules) BID 360 capsule 1  . pioglitazone (ACTOS) 15 MG tablet TAKE 1 TABLET(15 MG) BY MOUTH DAILY 90 tablet 0  . Probiotic Product (ALIGN PO) Take 1 tablet by mouth daily.    . nebivolol (BYSTOLIC) 10 MG tablet Take 1 tablet (10 mg total) by mouth daily. 90 tablet 1  . Turmeric 500 MG CAPS daily.     No facility-administered medications prior to  visit.    ROS Review of Systems  Constitutional: Positive for unexpected weight change (wt gain). Negative for chills, diaphoresis and fatigue.  HENT: Negative.   Eyes: Negative.   Respiratory: Negative for cough, chest tightness, shortness of breath and wheezing.   Cardiovascular: Negative for chest pain, palpitations and leg swelling.  Gastrointestinal: Negative for abdominal pain, diarrhea, nausea and vomiting.  Endocrine: Negative.   Genitourinary: Negative.  Negative for difficulty urinating.  Musculoskeletal: Negative for back pain and myalgias.  Skin: Negative.  Negative for color change and pallor.  Neurological: Negative.  Negative for dizziness, weakness, light-headedness, numbness and headaches.  Hematological: Negative for adenopathy. Does not bruise/bleed easily.  Psychiatric/Behavioral: Negative.     Objective:  BP 138/82   Pulse (!) 52   Temp 97.8 F (36.6 C) (Oral)   Resp 16   Ht 5' 3.5" (1.613 m)   Wt 162 lb (73.5 kg)   LMP 06/26/2004   SpO2 96%   BMI 28.25 kg/m   BP Readings from Last 3 Encounters:  12/31/20 138/82  06/04/20 124/74  05/30/20 131/80    Wt Readings from Last 3 Encounters:  12/31/20 162 lb (73.5 kg)  06/04/20 151 lb (68.5 kg)  05/30/20 151 lb 8 oz (68.7 kg)    Physical Exam Vitals reviewed.  HENT:     Nose: Nose normal.     Mouth/Throat:     Mouth: Mucous membranes are  moist.  Eyes:     General: No scleral icterus.    Conjunctiva/sclera: Conjunctivae normal.  Cardiovascular:     Rate and Rhythm: Regular rhythm. Bradycardia present.     Heart sounds: Normal heart sounds, S1 normal and S2 normal. No gallop.      Comments: EKG- Sinus bradycardia, 50 bpm Otherwise normal EKG Pulmonary:     Effort: Pulmonary effort is normal.     Breath sounds: No stridor. No wheezing, rhonchi or rales.  Abdominal:     General: Abdomen is flat.     Palpations: There is no mass.     Tenderness: There is no abdominal tenderness. There is no  guarding.  Musculoskeletal:        General: Normal range of motion.     Cervical back: Neck supple.     Right lower leg: No edema.     Left lower leg: No edema.  Skin:    General: Skin is warm and dry.  Neurological:     General: No focal deficit present.     Mental Status: She is alert and oriented to person, place, and time.  Psychiatric:        Mood and Affect: Mood normal.        Behavior: Behavior normal.     Lab Results  Component Value Date   WBC 4.8 05/31/2020   HGB 14.6 05/31/2020   HCT 42.7 05/31/2020   PLT 191.0 05/31/2020   GLUCOSE 102 (H) 05/31/2020   CHOL 204 (H) 05/31/2020   TRIG 224.0 (H) 05/31/2020   HDL 39.50 05/31/2020   LDLDIRECT 116.0 05/31/2020   LDLCALC 61 05/17/2014   ALT 61 (H) 05/31/2020   AST 38 (H) 05/31/2020   NA 140 05/31/2020   K 4.1 05/31/2020   CL 107 05/31/2020   CREATININE 0.74 05/31/2020   BUN 16 05/31/2020   CO2 27 05/31/2020   TSH 2.41 05/31/2020   HGBA1C 5.1 07/17/2011    DG Bone Density  Result Date: 09/27/2020 EXAM: DUAL X-RAY ABSORPTIOMETRY (DXA) FOR BONE MINERAL DENSITY IMPRESSION: Referring Physician:  Megan Salon Your patient completed a BMD test using Lunar IDXA DXA system ( analysis version: 16 ) manufactured by [employer]. Technologist: AW PATIENT: Name: Jessica Mack, Jessica Mack Patient ID: [id number] Birth Date: 06-30-1956 Height: 63.0 in. Sex: Female Measured: 09/27/2020 Weight: 157.4 lbs. Indications: Breast Cancer History, Caucasian, Estrogen Deficient, Height Loss (781.91), History of Osteopenia, Klonopin, Postmenopausal, Secondary Osteoporosis Fractures: Forearm Treatments: Calcium (E943.0), Vitamin D (E933.5) ASSESSMENT: The BMD measured at AP Spine L1-L2 is 0.878 g/cm2 with a T-score of -2.4. This patient is considered osteopenic according to [facility]) criteria. The scan quality is good. L-3 and L-4 were excluded due to degenerative changes. Site Region Measured Date Measured Age YA BMD Significant  CHANGE T-score AP Spine  L1-L2      09/27/2020    62.2         -2.4    0.878 g/cm2 DualFemur Neck Right 09/27/2020    62.2         -1.4    0.850 g/cm2 DualFemur Neck Right 08/26/2017 61.1 -1.4 0.845 g/cm2 * DualFemur Total Mean 09/27/2020    62.2         -0.7    0.921 g/cm2 DualFemur Total Mean 08/26/2017    59.1         -0.8    0.907 g/cm2 World Health Organization [facility]) criteria for post-menopausal, Caucasian Women: Normal  T-score at or above -1 SD Osteopenia   T-score between -1 and -2.5 SD Osteoporosis T-score at or below -2.5 SD RECOMMENDATION: 1. All patients should optimize calcium and vitamin D intake. 2. Consider FDA approved medical therapies in postmenopausal women and men aged 64 years and older, based on the following: a. A hip or vertebral (clinical or morphometric) fracture b. T- score < or = -2.5 at the femoral neck or spine after appropriate evaluation to exclude secondary causes c. Low bone mass (T-score between -1.0 and -2.5 at the femoral neck or spine) and a 10 year probability of a hip fracture > or = 3% or a 10 year probability of a major osteoporosis-related fracture > or = 20% based on the [place]-adapted WHO algorithm d. Clinician judgment and/or patient preferences may indicate treatment for people with 10-year fracture probabilities above or below these levels FOLLOW-UP: Patients with diagnosis of osteoporosis or at high risk for fracture should have regular bone mineral density tests. For patients eligible for Medicare, routine testing is allowed once every 2 years. The testing frequency can be increased to one year for patients who have rapidly progressing disease, those who are receiving or discontinuing medical therapy to restore bone mass, or have additional risk factors. FRAX* 10-year Probability of Fracture Based on femoral neck BMD: DualFemur (Right) Major Osteoporotic Fracture: 8.0% Hip Fracture:                0.7% Population:                  [place] (Caucasian) Risk Factors:                 Secondary Osteoporosis *FRAX is a [place] [employer] for Metabolic Bone Disease, a [place] (WHO) [facility]. ASSESSMENT: The probability of a major osteoporotic fracture is 8.0 % within the next ten years. The probability of a hip fracture is 0.7 % within the next ten years. Electronically Signed   By: Marlaine Hind M.D.   On: 09/27/2020 16:48   MM 3D SCREEN BREAST BILATERAL  Result Date: 09/27/2020 CLINICAL DATA:  Screening. History of LEFT breast cancer in 2005 status post lumpectomy and radiation therapy. EXAM: DIGITAL SCREENING BILATERAL MAMMOGRAM WITH TOMO AND CAD COMPARISON:  Previous exam(s). ACR Breast Density Category b: There are scattered areas of fibroglandular density. FINDINGS: There are stable postsurgical changes within the LEFT breast. There are no findings suspicious for malignancy within either breast. Images were processed with CAD. IMPRESSION: No mammographic evidence of malignancy. A result letter of this screening mammogram will be mailed directly to the patient. RECOMMENDATION: Screening mammogram in one year. (Code:SM-B-01Y) BI-RADS CATEGORY  2: Benign. Electronically Signed   By: Franki Cabot M.D.   On: 09/27/2020 12:44    Assessment & Plan:   Nazariah was seen today for hypertension.  Diagnoses and all orders for this visit:  NASH (nonalcoholic steatohepatitis) -     Protime-INR; Future -     Hepatic function panel; Future  Hypertriglyceridemia -     Triglycerides; Future  Bradycardia, drug induced- She has had no sx's from this.  Will decrease the beta-blocker dose by 50%. -     EKG 12-Lead -     TSH; Future -     Magnesium; Future  Essential hypertension- Her blood pressure is not quite adequately well controlled.  She has bradycardia so will decrease the dose of nebivolol.  I have asked her to  start taking an ARB. -     nebivolol (BYSTOLIC) 5 MG tablet; Take 1 tablet (5 mg total) by  mouth daily. -     olmesartan (BENICAR) 20 MG tablet; Take 1 tablet (20 mg total) by mouth daily. -     CBC with Differential/Platelet; Future -     Basic metabolic panel; Future -     TSH; Future -     Magnesium; Future  Elevated ferritin level -     Iron; Future -     Ferritin; Future   I have discontinued Rudi Rummage [place]'s Turmeric and nebivolol. I am also having her start on nebivolol and olmesartan. Additionally, I am having her maintain her Multiple Vitamins-Minerals (MULTIVITAMIN PO), Probiotic Product (ALIGN PO), famciclovir, famotidine, clonazePAM, omega-3 acid ethyl esters, ketoconazole, Estradiol, and pioglitazone.  Meds ordered this encounter  Medications  . nebivolol (BYSTOLIC) 5 MG tablet    Sig: Take 1 tablet (5 mg total) by mouth daily.    Dispense:  90 tablet    Refill:  1  . olmesartan (BENICAR) 20 MG tablet    Sig: Take 1 tablet (20 mg total) by mouth daily.    Dispense:  90 tablet    Refill:  1     Follow-up: Return in about 6 months (around 07/03/2021).  Scarlette Calico, MD

## 2021-01-05 ENCOUNTER — Encounter: Payer: Self-pay | Admitting: Internal Medicine

## 2021-01-17 ENCOUNTER — Other Ambulatory Visit (HOSPITAL_COMMUNITY): Payer: Self-pay | Admitting: Emergency Medicine

## 2021-01-18 ENCOUNTER — Other Ambulatory Visit: Payer: Self-pay | Admitting: Internal Medicine

## 2021-01-18 DIAGNOSIS — E781 Pure hyperglyceridemia: Secondary | ICD-10-CM

## 2021-01-23 ENCOUNTER — Other Ambulatory Visit: Payer: Self-pay

## 2021-01-23 ENCOUNTER — Ambulatory Visit: Payer: 59 | Admitting: Obstetrics and Gynecology

## 2021-01-23 ENCOUNTER — Ambulatory Visit (HOSPITAL_COMMUNITY)
Admission: RE | Admit: 2021-01-23 | Discharge: 2021-01-23 | Disposition: A | Discharge status: Home / Self Care | Payer: Self-pay | Source: Ambulatory Visit | Attending: Cardiology | Admitting: Cardiology

## 2021-02-11 ENCOUNTER — Encounter: Payer: Self-pay | Admitting: Internal Medicine

## 2021-02-11 DIAGNOSIS — H40013 Open angle with borderline findings, low risk, bilateral: Secondary | ICD-10-CM | POA: Diagnosis not present

## 2021-02-11 DIAGNOSIS — H01111 Allergic dermatitis of right upper eyelid: Secondary | ICD-10-CM | POA: Diagnosis not present

## 2021-02-11 DIAGNOSIS — H2513 Age-related nuclear cataract, bilateral: Secondary | ICD-10-CM | POA: Diagnosis not present

## 2021-02-11 DIAGNOSIS — H43813 Vitreous degeneration, bilateral: Secondary | ICD-10-CM | POA: Diagnosis not present

## 2021-02-11 NOTE — Telephone Encounter (Signed)
Key: [id number]

## 2021-02-18 NOTE — Telephone Encounter (Signed)
2nd attempt  Key: [id number]

## 2021-02-19 ENCOUNTER — Other Ambulatory Visit: Payer: Self-pay | Admitting: Internal Medicine

## 2021-02-19 DIAGNOSIS — G620 Drug-induced polyneuropathy: Secondary | ICD-10-CM

## 2021-02-20 ENCOUNTER — Other Ambulatory Visit: Payer: Self-pay | Admitting: Internal Medicine

## 2021-02-24 ENCOUNTER — Ambulatory Visit (HOSPITAL_BASED_OUTPATIENT_CLINIC_OR_DEPARTMENT_OTHER): Payer: 59 | Admitting: Obstetrics & Gynecology

## 2021-02-26 ENCOUNTER — Other Ambulatory Visit: Payer: Self-pay | Admitting: Internal Medicine

## 2021-02-26 DIAGNOSIS — K7581 Nonalcoholic steatohepatitis (NASH): Secondary | ICD-10-CM

## 2021-02-26 DIAGNOSIS — F4323 Adjustment disorder with mixed anxiety and depressed mood: Secondary | ICD-10-CM | POA: Diagnosis not present

## 2021-03-03 DIAGNOSIS — F4323 Adjustment disorder with mixed anxiety and depressed mood: Secondary | ICD-10-CM | POA: Diagnosis not present

## 2021-03-25 NOTE — Progress Notes (Signed)
65 y.o. G2P2 Married White or Caucasian female here for annual exam.  Doing well.  Working on planning a trip to [place].  Denies vaginal bleeding.  Has rare spotting with intercourse.  Uses rare vagifem.  Having some mild depression.  PHQ 7 today.  Seeing new therapist today.  Has noticed a rough place on right labia minora.  Patient's last menstrual period was 06/26/2004.          Sexually active: Yes.    The current method of family planning is post menopausal status.    Exercising: Yes.   Smoker:  no  Health Maintenance: Pap: 07/14/18 nml, 06/24/17 Neg HPV HR History of abnormal Pap:  Yes, [place] Neg HPV 2013 MMG:  09/27/20 Colonoscopy:  10/13/18 BMD:   09/27/20 TDaP:  11/2015 Pneumonia vaccine(s):  05/2020 Shingrix:   completed Hep C testing: completed Screening Labs: done with Dr. Ronnald Ramp   reports that she has never smoked. She has never used smokeless tobacco. She reports current alcohol use of about 7.0 standard drinks of alcohol per week. She reports that she does not use drugs.  Past Medical History:  Diagnosis Date  . Avascular necrosis of bone (HCC)    Left talus  . Blood transfusion   . Breast cancer ([place]) 2005   left  . Cancer ([place])   . Dysplastic nevus 1988   right hip  . GERD (gastroesophageal reflux disease)   . History of breast cancer 2005   invasive ductal cancer left breast  . Hypertension   . Hypertriglyceridemia   . Irritable bowel syndrome   . Metabolic syndrome    resolved with diet and exercise  . Nocturnal leg cramps 02/14/2015  . Osteopenia   . Palpitations 12/13   normal echo  . Peripheral neuropathy   . Personal history of chemotherapy   . Personal history of radiation therapy   . Plantar fasciitis 6/04  . Tongue dysplasia   . Ulcer     Past Surgical History:  Procedure Laterality Date  . ANKLE SURGERY  07/2007   left, revascularization  . BREAST LUMPECTOMY  06/2004   left, with port placement  . [place]   x2   . COLONOSCOPY  09/2013   Dr. Collene Mares  . DG GALL BLADDER    . LAPAROSCOPIC CHOLECYSTECTOMY  2003  . MOUTH SURGERY  11/15   implant/crown placed 4/16  . SHOULDER ARTHROSCOPY  10/2005   right  . SHOULDER SURGERY  12/15   left  . TONGUE SURGERY  2012, 2013  . WRIST SURGERY  1988   right, DeQuervains    Current Outpatient Medications  Medication Sig Dispense Refill  . clonazePAM (KLONOPIN) 1 MG tablet TAKE 1 TABLET(1 MG) BY MOUTH AT BEDTIME AS NEEDED FOR ANXIETY 90 tablet 1  . Estradiol 10 MCG TABS vaginal tablet 1 tablet vaginally twice weekly 24 tablet 1  . famciclovir (FAMVIR) 500 MG tablet 3 tablets (1519m) at onset of fever blister symptoms 15 tablet 1  . famotidine (PEPCID) 10 MG tablet Take 10 mg by mouth daily.    .Marland Kitchenketoconazole (NIZORAL) 2 % shampoo SMARTSIG:5 Milliliter(s) Topical 3 Times a Week    . Multiple Vitamins-Minerals (MULTIVITAMIN PO) Take 1 tablet by mouth daily. With calcium    . nebivolol (BYSTOLIC) 5 MG tablet Take 1 tablet (5 mg total) by mouth daily. 90 tablet 1  . olmesartan (BENICAR) 20 MG tablet Take 1 tablet (20 mg total) by mouth daily. 90 tablet  1  . omega-3 acid ethyl esters (LOVAZA) 1 g capsule TABLET 2 CAPSULES BY MOUTH TWICE DAILY 360 capsule 1  . pioglitazone (ACTOS) 15 MG tablet TAKE 1 TABLET(15 MG) BY MOUTH DAILY 90 tablet 1  . Probiotic Product (ALIGN PO) Take 1 tablet by mouth daily.     No current facility-administered medications for this visit.    Family History  Problem Relation Age of Onset  . Cancer Mother        melanoma  . Cancer Father        colon  . Prostate cancer Father   . Asthma Brother   . Asthma Sister   . Breast cancer Neg Hx     Review of Systems  All other systems reviewed and are negative.   Exam:   BP 118/67   Pulse 72   Ht 5' 3"  (1.6 m)   Wt 164 lb (74.4 kg)   LMP 06/26/2004   BMI 29.05 kg/m   Height: 5' 3"  (160 cm)  General appearance: alert, cooperative and appears stated age Head: Normocephalic,  without obvious abnormality, atraumatic Neck: no adenopathy, supple, symmetrical, trachea midline and thyroid normal to inspection and palpation Lungs: clear to auscultation bilaterally Breasts: normal appearance, no masses or tenderness Heart: regular rate and rhythm Abdomen: soft, non-tender; bowel sounds normal; no masses,  no organomegaly Extremities: extremities normal, atraumatic, no cyanosis or edema Skin: Skin color, texture, turgor normal. No rashes or lesions Lymph nodes: Cervical, supraclavicular, and axillary nodes normal. No abnormal inguinal nodes palpated Neurologic: Grossly normal   Pelvic: External genitalia:  Whitish lesion at introitus and towards rectum              Urethra:  normal appearing urethra with no masses, tenderness or lesions              Bartholins and Skenes: normal                 Vagina: normal appearing vagina with normal color and no discharge, no lesions              Cervix: no lesions              Pap taken: Yes.   Bimanual Exam:  Uterus:  normal size, contour, position, consistency, mobility, non-tender              Adnexa: normal adnexa and no mass, fullness, tenderness               Rectovaginal: Confirms               Anus:  normal sphincter tone, no lesions  Chaperone, Octaviano Batty, CMA, was present for exam.  Assessment/Plan: 1. Well woman exam with routine gynecological exam - Pap obtained today - MMG 2021 - Colonoscopy up to date - BMD 09/27/2020 - vaccines updated - blood work done with Dr. Ronnald Ramp  2. Osteopenia, unspecified location - consider repeating BMD next year  3. Malignant neoplasm of upper-outer quadrant of left female breast, unspecified estrogen receptor status ([place]) - release from oncology  4. Psychosocial stressors - mild depression on evaluation today.  Declines medication.  Seeing new therapist tomorrow.  5. Vulvar lesion - prior biopsy with hyperplasia and hyperkeratosis - will start clobetasol ointment  (TEMOVATE) 0.05 %; Apply 1 application topically at bedtime.  Dispense: 60 g; Refill: 0 - recheck 4-6 weeks

## 2021-03-26 ENCOUNTER — Ambulatory Visit (INDEPENDENT_AMBULATORY_CARE_PROVIDER_SITE_OTHER): Payer: BC Managed Care – PPO | Admitting: Obstetrics & Gynecology

## 2021-03-26 ENCOUNTER — Other Ambulatory Visit (HOSPITAL_COMMUNITY)
Admission: RE | Admit: 2021-03-26 | Discharge: 2021-03-26 | Disposition: A | Discharge status: Home / Self Care | Payer: BC Managed Care – PPO | Source: Ambulatory Visit | Attending: Obstetrics & Gynecology | Admitting: Obstetrics & Gynecology

## 2021-03-26 ENCOUNTER — Encounter (HOSPITAL_BASED_OUTPATIENT_CLINIC_OR_DEPARTMENT_OTHER): Payer: Self-pay | Admitting: Obstetrics & Gynecology

## 2021-03-26 ENCOUNTER — Other Ambulatory Visit: Payer: Self-pay

## 2021-03-26 VITALS — BP 118/67 | HR 72 | Ht 63.0 in | Wt 164.0 lb

## 2021-03-26 DIAGNOSIS — Z124 Encounter for screening for malignant neoplasm of cervix: Secondary | ICD-10-CM

## 2021-03-26 DIAGNOSIS — C50412 Malignant neoplasm of upper-outer quadrant of left female breast: Secondary | ICD-10-CM

## 2021-03-26 DIAGNOSIS — M8580 Other specified disorders of bone density and structure, unspecified site: Secondary | ICD-10-CM

## 2021-03-26 DIAGNOSIS — F4323 Adjustment disorder with mixed anxiety and depressed mood: Secondary | ICD-10-CM | POA: Diagnosis not present

## 2021-03-26 DIAGNOSIS — N9089 Other specified noninflammatory disorders of vulva and perineum: Secondary | ICD-10-CM

## 2021-03-26 DIAGNOSIS — Z01419 Encounter for gynecological examination (general) (routine) without abnormal findings: Secondary | ICD-10-CM

## 2021-03-26 DIAGNOSIS — Z658 Other specified problems related to psychosocial circumstances: Secondary | ICD-10-CM

## 2021-03-26 MED ORDER — CLOBETASOL PROPIONATE 0.05 % EX OINT: 1.0000 "application " | TOPICAL_OINTMENT | Freq: Every evening | CUTANEOUS | 0 refills | Status: DC

## 2021-03-26 NOTE — Patient Instructions (Addendum)
[facility] Dennard Nip, MD

## 2021-03-28 LAB — CYTOLOGY - PAP
Comment: NEGATIVE
Diagnosis: NEGATIVE
High risk HPV: NEGATIVE

## 2021-04-06 ENCOUNTER — Encounter: Payer: Self-pay | Admitting: Internal Medicine

## 2021-04-07 ENCOUNTER — Other Ambulatory Visit: Payer: Self-pay | Admitting: Internal Medicine

## 2021-04-07 DIAGNOSIS — J40 Bronchitis, not specified as acute or chronic: Secondary | ICD-10-CM | POA: Insufficient documentation

## 2021-04-07 DIAGNOSIS — U071 COVID-19: Secondary | ICD-10-CM

## 2021-04-07 MED ORDER — PAXLOVID 20 X 150 MG & 10 X 100MG PO TBPK: 3.0000 | ORAL_TABLET | Freq: Two times a day (BID) | ORAL | 0 refills | Status: AC

## 2021-04-09 DIAGNOSIS — F4323 Adjustment disorder with mixed anxiety and depressed mood: Secondary | ICD-10-CM | POA: Diagnosis not present

## 2021-04-10 ENCOUNTER — Encounter: Payer: Self-pay | Admitting: Internal Medicine

## 2021-04-11 ENCOUNTER — Encounter: Payer: Self-pay | Admitting: Internal Medicine

## 2021-04-18 ENCOUNTER — Other Ambulatory Visit: Payer: Self-pay | Admitting: Internal Medicine

## 2021-04-18 DIAGNOSIS — I10 Essential (primary) hypertension: Secondary | ICD-10-CM

## 2021-05-09 ENCOUNTER — Ambulatory Visit (HOSPITAL_BASED_OUTPATIENT_CLINIC_OR_DEPARTMENT_OTHER): Payer: BC Managed Care – PPO | Admitting: Obstetrics & Gynecology

## 2021-05-22 DIAGNOSIS — F4323 Adjustment disorder with mixed anxiety and depressed mood: Secondary | ICD-10-CM | POA: Diagnosis not present

## 2021-06-04 DIAGNOSIS — F4323 Adjustment disorder with mixed anxiety and depressed mood: Secondary | ICD-10-CM | POA: Diagnosis not present

## 2021-06-10 ENCOUNTER — Ambulatory Visit: Payer: 59 | Admitting: Neurology

## 2021-06-16 ENCOUNTER — Other Ambulatory Visit: Payer: Self-pay

## 2021-06-16 ENCOUNTER — Encounter (HOSPITAL_BASED_OUTPATIENT_CLINIC_OR_DEPARTMENT_OTHER): Payer: Self-pay | Admitting: Obstetrics & Gynecology

## 2021-06-16 ENCOUNTER — Ambulatory Visit (HOSPITAL_BASED_OUTPATIENT_CLINIC_OR_DEPARTMENT_OTHER): Payer: BC Managed Care – PPO | Admitting: Obstetrics & Gynecology

## 2021-06-16 VITALS — BP 116/75 | HR 71 | Ht 63.0 in

## 2021-06-16 DIAGNOSIS — N9089 Other specified noninflammatory disorders of vulva and perineum: Secondary | ICD-10-CM

## 2021-06-16 DIAGNOSIS — I10 Essential (primary) hypertension: Secondary | ICD-10-CM | POA: Diagnosis not present

## 2021-06-16 DIAGNOSIS — Z6829 Body mass index (BMI) 29.0-29.9, adult: Secondary | ICD-10-CM

## 2021-06-16 MED ORDER — MOMETASONE FUROATE 0.1 % EX OINT: TOPICAL_OINTMENT | CUTANEOUS | 1 refills | Status: DC

## 2021-06-16 NOTE — Progress Notes (Signed)
GYNECOLOGY  VISIT  CC:   Jessica Mack  HPI: 65 y.o. G2P2 Married White or Caucasian female here for Jessica Mack.  H/o whitish, thickened tissue with area of superficial ulceration noted at last exam.  Area has been biopsied twice in the past showing hyperplasia and hyperkeratosis.  Started treatment with clobetasol.  Pt here for follow up.  Denies pain or itching.  Denies bleeding.    We discussed possible referral to [facility].  BMI 29.  H/o elevated lipids and triglycerides.    GYNECOLOGIC HISTORY: Patient's last menstrual period was 06/26/2004. Contraception: PMP  Patient Active Problem List   Diagnosis Date Noted   Bronchitis due to COVID-19 virus 04/07/2021   NASH (nonalcoholic steatohepatitis) 06/04/2020   Hypertriglyceridemia    Routine general medical examination at a health care facility 02/20/2020   Essential hypertension 02/20/2020   Osteopenia 08/05/2014   Hyperlipidemia 07/25/2013   Breast cancer of upper-outer quadrant of left female breast ([place]) 07/20/2013   Breast cancer screening, high risk patient 05/30/2013   Leukoplakia of oral mucosa, including tongue 07/26/2012   Essential and other specified forms of tremor 07/26/2012   Drug-induced polyneuropathy ([place]) 07/26/2012   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[place]) 07/26/2012    Past Medical History:  Diagnosis Date   Avascular necrosis of bone ([place])    Left talus   Blood transfusion    Breast cancer ([place]) 2005   left   Cancer ([place])    Dysplastic nevus 1988   right hip   GERD (gastroesophageal reflux disease)    History of breast cancer 2005   invasive ductal cancer left breast   Hypertension    Hypertriglyceridemia    Irritable bowel syndrome    Metabolic syndrome    resolved with diet and exercise   Nocturnal leg cramps 02/14/2015   Osteopenia    Palpitations 12/13   normal echo   Peripheral neuropathy    Personal history of chemotherapy    Personal history of radiation  therapy    Plantar fasciitis 6/04   Tongue dysplasia    Ulcer     Past Surgical History:  Procedure Laterality Date   ANKLE SURGERY  07/2007   left, revascularization   BREAST LUMPECTOMY  06/2004   left, with port placement   [place]   x2   COLONOSCOPY  09/2013   Dr. Collene Mares   DG GALL BLADDER     LAPAROSCOPIC CHOLECYSTECTOMY  2003   [place]  11/15   implant/crown placed 4/16   SHOULDER ARTHROSCOPY  10/2005   right   SHOULDER SURGERY  12/15   left   TONGUE SURGERY  2012, 2013   [place]   right, DeQuervains    MEDS:   Current Outpatient Medications on File Prior to Visit  Medication Sig Dispense Refill   clobetasol ointment (TEMOVATE) 1.61 % Apply 1 application topically at bedtime. 60 g 0   clonazePAM (KLONOPIN) 1 MG tablet TAKE 1 TABLET(1 MG) BY MOUTH AT BEDTIME AS NEEDED FOR ANXIETY 90 tablet 1   famotidine (PEPCID) 10 MG tablet Take 10 mg by mouth daily.     ketoconazole (NIZORAL) 2 % shampoo SMARTSIG:5 Milliliter(s) Topical 3 Times a Week     Multiple Vitamins-Minerals (MULTIVITAMIN PO) Take 1 tablet by mouth daily. With calcium     nebivolol (BYSTOLIC) 5 MG tablet Take 1 tablet (5 mg total) by mouth daily. 90 tablet 1   olmesartan (BENICAR) 20  MG tablet TAKE 1 TABLET(20 MG) BY MOUTH DAILY 90 tablet 1   omega-3 acid ethyl esters (LOVAZA) 1 g capsule TABLET 2 CAPSULES BY MOUTH TWICE DAILY 360 capsule 1   pioglitazone (ACTOS) 15 MG tablet TAKE 1 TABLET(15 MG) BY MOUTH DAILY 90 tablet 1   Probiotic Product (ALIGN PO) Take 1 tablet by mouth daily.     Estradiol 10 MCG TABS vaginal tablet 1 tablet vaginally twice weekly (Patient not taking: Reported on 06/16/2021) 24 tablet 1   famciclovir (FAMVIR) 500 MG tablet 3 tablets (1551m) at onset of fever blister symptoms (Patient not taking: Reported on 06/16/2021) 15 tablet 1   No current facility-administered medications on file prior to visit.    ALLERGIES: Augmentin [amoxicillin-pot  clavulanate], Cephalexin, and Crestor [rosuvastatin]  Family History  Problem Relation Age of Onset   Cancer Mother        melanoma   Cancer Father        colon   Prostate cancer Father    Asthma Brother    Asthma Sister    Breast cancer Neg Hx     SH:  married, non smoker  Review of Systems  Constitutional: Negative.   Genitourinary: Negative.    PHYSICAL EXAMINATION:    BP 116/75   Pulse 71   Ht 5' 3"  (1.6 m)   LMP 06/26/2004   BMI 29.05 kg/m     General appearance: alert, cooperative and appears stated age Lymph:  no inguinal LAD noted  Pelvic: External genitalia:  area of prior ulceration is resolved, there is thickened whitened tissue on perineal body (this finding is stable from prior exams)               Assessment/Plan: 1. Vulvar Mack - will switch from clobetasol to mometasone 0.1% topically twice weekly.  2. BMI 29.0-29.9,adult - will communicate with Dr. WJuleen Chinaabout possible referral.

## 2021-06-17 ENCOUNTER — Ambulatory Visit: Payer: BC Managed Care – PPO | Admitting: Neurology

## 2021-06-17 ENCOUNTER — Encounter: Payer: Self-pay | Admitting: Neurology

## 2021-06-17 VITALS — BP 112/78 | HR 63 | Ht 63.5 in | Wt 163.0 lb

## 2021-06-17 DIAGNOSIS — G4762 Sleep related leg cramps: Secondary | ICD-10-CM

## 2021-06-17 DIAGNOSIS — G620 Drug-induced polyneuropathy: Secondary | ICD-10-CM

## 2021-06-17 MED ORDER — BACLOFEN 10 MG PO TABS: 5.0000 mg | ORAL_TABLET | Freq: Every evening | ORAL | 1 refills | Status: DC | PRN

## 2021-06-17 NOTE — Progress Notes (Signed)
Reason for visit: Taxol induced neuropathy, nocturnal leg cramps  Jessica Mack is an 65 y.o. 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  The patient does have neuropathic pain off and on which may be induced by stretching.  More recently, she has been active with biking and doing Pilates.  She has noted a significant increase in the frequency of her nocturnal leg cramps.  She may wake up several times during the evening because of the leg cramps, she is not sleeping and resting well because of this.  She is looking into a weight loss program.  She takes clonazepam at night for sleep but this does not prevent her from waking up with cramps.  She returns the office today for further evaluation.  Past Medical History:  Diagnosis Date   Avascular necrosis of bone ([place])    Left talus   Blood transfusion    Breast cancer ([place]) 2005   left   Cancer ([place])    Dysplastic nevus 1988   right hip   GERD (gastroesophageal reflux disease)    History of breast cancer 2005   invasive ductal cancer left breast   Hypertension    Hypertriglyceridemia    Irritable bowel syndrome    Metabolic syndrome    resolved with diet and exercise   Nocturnal leg cramps 02/14/2015   Osteopenia    Palpitations 12/13   normal echo   Peripheral neuropathy    Personal history of chemotherapy    Personal history of radiation therapy    Plantar fasciitis 6/04   Tongue dysplasia    Ulcer     Past Surgical History:  Procedure Laterality Date   ANKLE SURGERY  07/2007   left, revascularization   BREAST LUMPECTOMY  06/2004   left, with port placement   [place]   x2   COLONOSCOPY  09/2013   Dr. Collene Mares   DG GALL BLADDER     LAPAROSCOPIC CHOLECYSTECTOMY  2003   [facility]  11/15   implant/crown placed 4/16   SHOULDER ARTHROSCOPY  10/2005   right   SHOULDER SURGERY   12/15   left   TONGUE SURGERY  2012, 2013   WRIST SURGERY  1988   right, [place]    Family History  Problem Relation Age of Onset   Cancer Mother        melanoma   Cancer Father        colon   Prostate cancer Father    Asthma Sister    Asthma Brother    Breast cancer Neg Hx     Social history:  reports that she has never smoked. She has never used smokeless tobacco. She reports current alcohol use of about 7.0 standard drinks per week. She reports that she does not use drugs.    Allergies  Allergen Reactions   Augmentin [Amoxicillin-Pot Clavulanate]     abd cramping   Cephalexin     Profuse diarrhea   Crestor [Rosuvastatin]     Liver function elevated     Medications:  Prior to Admission medications   Medication Sig Start Date End Date Taking? Authorizing Provider  clonazePAM (KLONOPIN) 1 MG tablet TAKE 1 TABLET(1 MG) BY MOUTH AT BEDTIME AS NEEDED FOR ANXIETY 02/19/21  Yes Janith Lima, MD  Estradiol 10 MCG TABS vaginal tablet 1 tablet vaginally twice weekly  10/01/20  Yes Nunzio Cobbs, MD  famciclovir [facility]) 500 MG tablet 3 tablets (1565m) at onset of fever blister symptoms 10/28/18  Yes MMegan Salon MD  famotidine (PEPCID) 10 MG tablet Take 10 mg by mouth daily.   Yes [provider]  ketoconazole (NIZORAL) 2 % shampoo SMARTSIG:5 Milliliter(s) Topical 3 Times a Week 07/02/20  Yes [provider]  mometasone (ELOCON) 0.1 % ointment Apply topically twice weekly 06/16/21  Yes MMegan Salon MD  Multiple Vitamins-Minerals (MULTIVITAMIN PO) Take 1 tablet by mouth daily. With calcium   Yes [provider]  nebivolol (BYSTOLIC) 5 MG tablet Take 1 tablet (5 mg total) by mouth daily. 12/31/20  Yes JJanith Lima MD  olmesartan (BENICAR) 20 MG tablet TAKE 1 TABLET(20 MG) BY MOUTH DAILY 04/18/21  Yes JJanith Lima MD  omega-3 acid ethyl esters (LOVAZA) 1 g capsule TABLET 2 CAPSULES BY MOUTH TWICE DAILY 01/18/21  Yes JJanith Lima  MD  pioglitazone (ACTOS) 15 MG tablet TAKE 1 TABLET(15 MG) BY MOUTH DAILY 02/26/21  Yes JJanith Lima MD  Probiotic Product (ALIGN PO) Take 1 tablet by mouth daily.   Yes [provider]    ROS:  Out of a complete 14 system review of symptoms, the patient complains only of the following symptoms, and all other reviewed systems are negative.  Leg cramps Nerve pain  Blood pressure 112/78, pulse 63, height 5' 3.5" (1.613 m), weight 163 lb (73.9 kg), last menstrual period 06/26/2004.  Physical Exam  General: The patient is alert and cooperative at the time of the examination.  Skin: No significant peripheral edema is noted.   Neurologic Exam  Mental status: The patient is alert and oriented x 3 at the time of the examination. The patient has apparent normal recent and remote memory, with an apparently normal attention span and concentration ability.   Cranial nerves: Facial symmetry is present. Speech is normal, no aphasia or dysarthria is noted. Extraocular movements are full. Visual fields are full.  Motor: The patient has good strength in all 4 extremities.  Sensory examination: Soft touch sensation is symmetric on the face, arms, and legs.  Coordination: The patient has good finger-nose-finger and heel-to-shin bilaterally.  Gait and station: The patient has a normal gait. Tandem gait is normal. Romberg is negative. No drift is seen.  Reflexes: Deep tendon reflexes are symmetric.   Assessment/Plan:  1.  Nocturnal leg cramps  2.  Taxol induced neuropathy/neuronopathy  The patient is having a lot of issues with nocturnal leg cramps.  She has irritable bowel syndrome with associated diarrhea and probably could not tolerate magnesium supplementation.  We will try low-dose baclofen taking 5 mg at night, if this is not effective we will go up on the dose.  A prescription was sent in.  She will follow-up here in 1 year.  In the future, she can be followed through Dr.  DBrett Fairy  CJill AlexandersMD 06/17/2021 12:25 PM  [facility] [address] [place] [address] Phone [phone] [phone]

## 2021-06-19 DIAGNOSIS — F4323 Adjustment disorder with mixed anxiety and depressed mood: Secondary | ICD-10-CM | POA: Diagnosis not present

## 2021-07-02 DIAGNOSIS — F4323 Adjustment disorder with mixed anxiety and depressed mood: Secondary | ICD-10-CM | POA: Diagnosis not present

## 2021-07-03 ENCOUNTER — Ambulatory Visit: Payer: BC Managed Care – PPO | Admitting: Internal Medicine

## 2021-07-07 ENCOUNTER — Ambulatory Visit (INDEPENDENT_AMBULATORY_CARE_PROVIDER_SITE_OTHER): Payer: BC Managed Care – PPO | Admitting: Internal Medicine

## 2021-07-07 ENCOUNTER — Other Ambulatory Visit: Payer: Self-pay

## 2021-07-07 ENCOUNTER — Encounter: Payer: Self-pay | Admitting: Internal Medicine

## 2021-07-07 ENCOUNTER — Ambulatory Visit: Payer: BC Managed Care – PPO | Admitting: Internal Medicine

## 2021-07-07 VITALS — BP 124/78 | HR 62 | Temp 98.4°F | Resp 16 | Ht 63.5 in | Wt 159.0 lb

## 2021-07-07 DIAGNOSIS — Z23 Encounter for immunization: Secondary | ICD-10-CM

## 2021-07-07 DIAGNOSIS — I10 Essential (primary) hypertension: Secondary | ICD-10-CM | POA: Diagnosis not present

## 2021-07-07 DIAGNOSIS — Z0000 Encounter for general adult medical examination without abnormal findings: Secondary | ICD-10-CM

## 2021-07-07 DIAGNOSIS — Z0289 Encounter for other administrative examinations: Secondary | ICD-10-CM

## 2021-07-07 DIAGNOSIS — K7581 Nonalcoholic steatohepatitis (NASH): Secondary | ICD-10-CM

## 2021-07-07 NOTE — Progress Notes (Signed)
Subjective:  Patient ID: Jessica Mack, female    DOB: [birth year]  Age: 65 y.o. MRN: [id number]  CC: Annual Exam and Hypertension  This visit occurred during the SARS-CoV-2 public health emergency.  Safety protocols were in place, including screening questions prior to the visit, additional usage of staff PPE, and extensive cleaning of exam room while observing appropriate contact time as indicated for disinfecting solutions.    HPI Jessica Mack presents for a CPX and f/up -    She is active and denies chest pain, shortness of breath, dyspnea on exertion, diaphoresis, dizziness, or lightheadedness.  She has a history of nocturnal cramps and drug-induced neuropathy but has gotten some symptom relief with baclofen and clonazepam.  Outpatient Medications Prior to Visit  Medication Sig Dispense Refill   baclofen (LIORESAL) 10 MG tablet Take 0.5 tablets (5 mg total) by mouth at bedtime as needed for muscle spasms. 45 each 1   clonazePAM (KLONOPIN) 1 MG tablet TAKE 1 TABLET(1 MG) BY MOUTH AT BEDTIME AS NEEDED FOR ANXIETY 90 tablet 1   Estradiol 10 MCG TABS vaginal tablet 1 tablet vaginally twice weekly 24 tablet 1   famciclovir (FAMVIR) 500 MG tablet 3 tablets (1569m) at onset of fever blister symptoms 15 tablet 1   famotidine (PEPCID) 10 MG tablet Take 10 mg by mouth daily.     ketoconazole (NIZORAL) 2 % shampoo SMARTSIG:5 Milliliter(s) Topical 3 Times a Week     mometasone (ELOCON) 0.1 % ointment Apply topically twice weekly 45 g 1   Multiple Vitamins-Minerals (MULTIVITAMIN PO) Take 1 tablet by mouth daily. With calcium     nebivolol (BYSTOLIC) 5 MG tablet Take 1 tablet (5 mg total) by mouth daily. 90 tablet 1   olmesartan (BENICAR) 20 MG tablet TAKE 1 TABLET(20 MG) BY MOUTH DAILY 90 tablet 1   omega-3 acid ethyl esters (LOVAZA) 1 g capsule TABLET 2 CAPSULES BY MOUTH TWICE DAILY 360 capsule 1   pioglitazone (ACTOS) 15 MG tablet TAKE 1 TABLET(15 MG) BY MOUTH DAILY 90 tablet 1   Probiotic  Product (ALIGN PO) Take 1 tablet by mouth daily.     No facility-administered medications prior to visit.    ROS Review of Systems  Constitutional:  Negative for diaphoresis, fatigue and unexpected weight change.  HENT: Negative.    Eyes: Negative.   Respiratory:  Negative for cough, chest tightness, shortness of breath and wheezing.   Cardiovascular:  Negative for chest pain, palpitations and leg swelling.  Gastrointestinal:  Negative for abdominal pain, diarrhea and nausea.  Endocrine: Negative.   Genitourinary: Negative.  Negative for difficulty urinating.  Musculoskeletal: Negative.   Skin: Negative.   Neurological: Negative.  Negative for dizziness, weakness, light-headedness, numbness and headaches.  Hematological:  Negative for adenopathy. Does not bruise/bleed easily.  Psychiatric/Behavioral: Negative.     Objective:  BP 124/78 (BP Location: Right Arm, Patient Position: Sitting, Cuff Size: Large)   Pulse 62   Temp 98.4 F (36.9 C) (Oral)   Resp 16   Ht 5' 3.5" (1.613 m)   Wt 159 lb (72.1 kg)   LMP 06/26/2004   SpO2 98%   BMI 27.72 kg/m   BP Readings from Last 3 Encounters:  07/07/21 124/78  06/17/21 112/78  06/16/21 116/75    Wt Readings from Last 3 Encounters:  07/07/21 159 lb (72.1 kg)  06/17/21 163 lb (73.9 kg)  03/26/21 164 lb (74.4 kg)    Physical Exam Vitals reviewed.  Constitutional:  Appearance: Normal appearance.  HENT:     Nose: Nose normal.     Mouth/Throat:     Mouth: Mucous membranes are moist.  Eyes:     General: No scleral icterus.    Conjunctiva/sclera: Conjunctivae normal.  Cardiovascular:     Rate and Rhythm: Normal rate and regular rhythm.     Heart sounds: No murmur heard. Pulmonary:     Effort: Pulmonary effort is normal.     Breath sounds: No stridor. No wheezing, rhonchi or rales.  Abdominal:     General: Abdomen is flat.     Palpations: There is no mass.     Tenderness: There is no abdominal tenderness. There is no  guarding.  Musculoskeletal:        General: Normal range of motion.     Cervical back: Neck supple.     Right lower leg: No edema.     Left lower leg: No edema.  Lymphadenopathy:     Cervical: No cervical adenopathy.  Skin:    General: Skin is warm and dry.  Neurological:     General: No focal deficit present.     Mental Status: She is alert.  Psychiatric:        Mood and Affect: Mood normal.        Behavior: Behavior normal.    Lab Results  Component Value Date   WBC 4.8 05/31/2020   HGB 14.6 05/31/2020   HCT 42.7 05/31/2020   PLT 191.0 05/31/2020   GLUCOSE 102 (H) 05/31/2020   CHOL 204 (H) 05/31/2020   TRIG 224.0 (H) 05/31/2020   HDL 39.50 05/31/2020   LDLDIRECT 116.0 05/31/2020   LDLCALC 61 05/17/2014   ALT 61 (H) 05/31/2020   AST 38 (H) 05/31/2020   NA 140 05/31/2020   K 4.1 05/31/2020   CL 107 05/31/2020   CREATININE 0.74 05/31/2020   BUN 16 05/31/2020   CO2 27 05/31/2020   TSH 2.41 05/31/2020   HGBA1C 5.1 07/17/2011    No results found.  Assessment & Plan:   Jessica Mack was seen today for annual exam and hypertension.  Diagnoses and all orders for this visit:  Essential hypertension- Her blood pressure is adequately well controlled.  She will have electrolytes and renal function done [facility]  Routine general medical examination at a health care facility- Exam completed, no labs ordered today at her request-she has an appointment next week with [facility] and she believes that they will do all of her lab work, cancer screenings are up-to-date, vaccines addressed, patient education material was given.  NASH (nonalcoholic steatohepatitis)- For now she will continue taking pioglitazone.  Other orders -     Flu Vaccine QUAD 6+ mos PF IM (Fluarix Quad PF)  I am having Jessica Mack maintain her Multiple Vitamins-Minerals (MULTIVITAMIN PO), Probiotic Product (ALIGN PO), famciclovir, famotidine, ketoconazole, Estradiol,  nebivolol, omega-3 acid ethyl esters, clonazePAM, pioglitazone, olmesartan, mometasone, and baclofen.  No orders of the defined types were placed in this encounter.    Follow-up: Return in about 6 months (around 01/04/2022).  Jessica Calico, MD

## 2021-07-07 NOTE — Patient Instructions (Signed)

## 2021-07-15 ENCOUNTER — Encounter (INDEPENDENT_AMBULATORY_CARE_PROVIDER_SITE_OTHER): Payer: Self-pay | Admitting: Family Medicine

## 2021-07-15 ENCOUNTER — Other Ambulatory Visit: Payer: Self-pay

## 2021-07-15 ENCOUNTER — Ambulatory Visit (INDEPENDENT_AMBULATORY_CARE_PROVIDER_SITE_OTHER): Payer: BC Managed Care – PPO | Admitting: Family Medicine

## 2021-07-15 VITALS — BP 117/81 | HR 64 | Temp 98.0°F | Ht 63.0 in | Wt 162.0 lb

## 2021-07-15 DIAGNOSIS — Z9189 Other specified personal risk factors, not elsewhere classified: Secondary | ICD-10-CM | POA: Diagnosis not present

## 2021-07-15 DIAGNOSIS — K7581 Nonalcoholic steatohepatitis (NASH): Secondary | ICD-10-CM

## 2021-07-15 DIAGNOSIS — M8580 Other specified disorders of bone density and structure, unspecified site: Secondary | ICD-10-CM

## 2021-07-15 DIAGNOSIS — R0602 Shortness of breath: Secondary | ICD-10-CM | POA: Diagnosis not present

## 2021-07-15 DIAGNOSIS — R7301 Impaired fasting glucose: Secondary | ICD-10-CM

## 2021-07-15 DIAGNOSIS — R5383 Other fatigue: Secondary | ICD-10-CM | POA: Diagnosis not present

## 2021-07-15 DIAGNOSIS — E669 Obesity, unspecified: Secondary | ICD-10-CM

## 2021-07-15 DIAGNOSIS — Z853 Personal history of malignant neoplasm of breast: Secondary | ICD-10-CM

## 2021-07-15 DIAGNOSIS — Z6830 Body mass index (BMI) 30.0-30.9, adult: Secondary | ICD-10-CM

## 2021-07-15 DIAGNOSIS — G6289 Other specified polyneuropathies: Secondary | ICD-10-CM

## 2021-07-15 DIAGNOSIS — Z1331 Encounter for screening for depression: Secondary | ICD-10-CM

## 2021-07-15 DIAGNOSIS — E781 Pure hyperglyceridemia: Secondary | ICD-10-CM | POA: Diagnosis not present

## 2021-07-15 DIAGNOSIS — K589 Irritable bowel syndrome without diarrhea: Secondary | ICD-10-CM

## 2021-07-15 DIAGNOSIS — I10 Essential (primary) hypertension: Secondary | ICD-10-CM | POA: Diagnosis not present

## 2021-07-15 MED ORDER — TIRZEPATIDE 2.5 MG/0.5ML ~~LOC~~ SOAJ: 2.5000 mg | SUBCUTANEOUS | 0 refills | Status: DC

## 2021-07-15 NOTE — Progress Notes (Signed)
Dear Dr. Sabra Heck,   Thank you for referring Jessica Mack to our clinic. The following note includes my evaluation and treatment recommendations.  Chief Complaint:   OBESITY Jessica Mack (MR# [id number]) is a 65 y.o. female who presents for evaluation and treatment of obesity and related comorbidities. Current BMI is Body mass index is 28.7 kg/m. Jessica Mack has been struggling with her weight for many years and has been unsuccessful in either losing weight, maintaining weight loss, or reaching her healthy weight goal.  Jessica Mack is currently in the action stage of change and ready to dedicate time achieving and maintaining a healthier weight. Jessica Mack is interested in becoming our patient and working on intensive lifestyle modifications including (but not limited to) diet and exercise for weight loss.  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  She is married and lives with her husband.  She gets around 8,000 steps per day.  She drinks 8-12 ounces of wine per day.  She says she eats larger portion sizes and craves carbs.  Jessica Mack's habits were reviewed today and are as follows: Her family eats meals together, she thinks her family will eat healthier with her, her desired weight loss is 27 pounds, she has been heavy most of her life, she started gaining excessive weight in the past year, her heaviest weight ever was 172 pounds, she craves carbs, she snacks frequently in the evenings, she skips meals frequently, she is frequently drinking liquids with calories, and she frequently eats larger portions than normal.  Depression Screen Jessica Mack's Food and Mood (modified PHQ-9) score was 7.  Depression screen PHQ 2/9 07/15/2021  Decreased Interest 1  Down, Depressed, Hopeless 2  PHQ - 2 Score 3  Altered sleeping 2  Tired, decreased energy 1  Change in appetite 0  Feeling bad or failure about yourself  1  Trouble concentrating 0  Moving slowly or fidgety/restless 0  Suicidal thoughts 0   PHQ-9 Score 7  Difficult doing work/chores Not difficult at all   Assessment/Plan:   1. Other fatigue Jessica Mack denies daytime somnolence and  does not know if she wakes up tired. Patent has a history of symptoms of morning headache and snoring. Jessica Mack generally gets 7 hours of sleep per night, and states that she has poor quality sleep. Snoring is present. Apneic episodes are not present. Epworth Sleepiness Score is 6.  Jessica Mack does feel that her weight is causing her energy to be lower than it should be. Fatigue may be related to obesity, depression or many other causes. Labs will be ordered, and in the meanwhile, Jessica Mack will focus on self care including making healthy food choices, increasing physical activity and focusing on stress reduction.  - Anemia panel - CBC with Differential/Platelet - TSH - T4, free  2. SOB (shortness of breath) on exertion Jessica Mack notes increasing shortness of breath with exercising and seems to be worsening over time with weight gain. She notes getting out of breath sooner with activity than she used to. This has not gotten worse recently. Jessica Mack denies shortness of breath at rest or orthopnea.  Jessica Mack does not feel that she gets out of breath more easily that she used to when she exercises. Jessica Mack's shortness of breath appears to be obesity related and exercise induced. She has agreed to work on weight loss and gradually increase exercise to treat her exercise induced shortness of breath. Will continue to monitor closely.  3. Essential hypertension At goal. Medications: Bystolic  5 mg daily, Benicar 20 mg daiy.   Plan: Avoid buying foods that are: processed, frozen, or prepackaged to avoid excess salt. We will watch for signs of hypotension as she continues lifestyle modifications.  BP Readings from Last 3 Encounters:  07/15/21 117/81  07/07/21 124/78  06/17/21 112/78   Lab Results  Component Value Date   CREATININE 0.74 05/31/2020   4. NASH (nonalcoholic  steatohepatitis) NAFLD is an umbrella term that encompasses a disease spectrum that includes steatosis (fat) without inflammation, steatohepatitis (NASH; fat + inflammation in a characteristic pattern), and cirrhosis. Bland steatosis is felt to be a benign condition, with extremely low to no risk of progression to cirrhosis, whereas NASH can progress to cirrhosis. The mainstay of treatment of NAFLD includes lifestyle modification to achieve weight loss, at least 7% of current body weight. Low carbohydrate diets can be beneficial in improving NAFLD liver histology. Additionally, exercise, even the absence of weight loss can have beneficial effects on the patient's metabolic profile and liver health.   She is currently taking Actos for this, but has noted that her weight has increased due to the obesogenic nature of the medication. I have asked her to stop Actos and we will replace with a GLP1RA.  - Comprehensive metabolic panel  5. Osteopenia, unspecified location Will check vitamin D level today, as per below.  - VITAMIN D 25 Hydroxy (Vit-D Deficiency, Fractures)  6. Hypertriglyceridemia Course: Not at goal. Lipid-lowering medications: None.   Plan: Dietary changes: Increase soluble fiber, decrease simple carbohydrates, decrease saturated fat. Exercise changes: Moderate to vigorous-intensity aerobic activity 150 minutes per week or as tolerated. We will continue to monitor along with PCP/specialists as it pertains to her weight loss journey.  Will check lipid panel today.  Lab Results  Component Value Date   CHOL 204 (H) 05/31/2020   HDL 39.50 05/31/2020   LDLCALC 61 05/17/2014   LDLDIRECT 116.0 05/31/2020   TRIG 224.0 (H) 05/31/2020   CHOLHDL 5 05/31/2020   Lab Results  Component Value Date   ALT 61 (H) 05/31/2020   AST 38 (H) 05/31/2020   ALKPHOS 54 05/31/2020   BILITOT 0.5 05/31/2020   The 10-year ASCVD risk score (Arnett DK, et al., 2019) is: 6.1%   Values used to calculate the  score:     Age: 56 years     Sex: Female     Is Non-Hispanic African American: No     Diabetic: No     Tobacco smoker: No     Systolic Blood Pressure: 616 mmHg     Is BP treated: Yes     HDL Cholesterol: 39.5 mg/dL     Total Cholesterol: 204 mg/dL  - Lipid panel  7. Other polyneuropathy Medication-induced.  Followed by Dr. Jannifer Franklin in Neurology.  8. Impaired fasting glucose Jessica Mack will start Mounjaro 2.5 mg subcutaneously weekly, as per below.  - Start tirzepatide [facility]) 2.5 MG/0.5ML Pen; Inject 2.5 mg into the skin once a week.  Dispense: 2 mL; Refill: 0  9. Fasting hyperglycemia Will check A1c and Insulin level today.  - Hemoglobin A1c - Insulin, random  10. History of breast cancer, invasive ductal, left breast Currently monitored by her PCP.  Will follow-up with Oncology if needed.  11. Irritable bowel syndrome The cause is not completely understood, but is likely to be a combination of genetics, diet, stress, visceral hypersensitivity and the gut microbiome.  The available treatments aim to control symptoms even if unable to "cure" the condition.  While not physically harmful, IBS can have a significant impact on quality of life.  12. Depression screening Jessica Mack was screened for depression as part of her new patient workup today.  PHQ-9 is 7.  Jessica Mack had a positive depression screening. Depression is commonly associated with obesity and often results in emotional eating behaviors. We will monitor this closely and work on CBT to help improve the non-hunger eating patterns. Referral to Psychology may be required if no improvement is seen as she continues in our clinic.  32. At risk for heart disease Due to Kayce's current state of health and medical condition(s), she is at a higher risk for heart disease.  This puts the patient at much greater risk to subsequently develop cardiopulmonary conditions that can significantly affect patient's quality of life in a negative manner.     At least 9 minutes were spent on counseling Jessica Mack about these concerns today. Evidence-based interventions for health behavior change were utilized today including the discussion of self monitoring techniques, problem-solving barriers, and SMART goal setting techniques.  Specifically, regarding patient's less desirable eating habits and patterns, we employed the technique of small changes when Jessica Mack has not been able to fully commit to her prudent nutritional plan.  14. Class 1 obesity with serious comorbidity and body mass index (BMI) of 30.0 to 30.9 in adult, unspecified obesity type  Jessica Mack is currently in the action stage of change and her goal is to continue with weight loss efforts. I recommend Jessica Mack begin the structured treatment plan as follows:  She has agreed to practicing portion control and making smarter food choices, such as increasing vegetables and decreasing simple carbohydrates.  Exercise goals:  As is.    Behavioral modification strategies: increasing lean protein intake, decreasing simple carbohydrates, increasing vegetables, increasing water intake, decreasing liquid calories, decreasing alcohol intake, decreasing sodium intake, and increasing high fiber foods.  She was informed of the importance of frequent follow-up visits to maximize her success with intensive lifestyle modifications for her multiple health conditions. She was informed we would discuss her lab results at her next visit unless there is a critical issue that needs to be addressed sooner. Jessica Mack agreed to keep her next visit at the agreed upon time to discuss these results.  Objective:   Blood pressure 117/81, pulse 64, temperature 98 F (36.7 C), temperature source Oral, height 5' 3"  (1.6 m), weight 162 lb (73.5 kg), last menstrual period 06/26/2004, SpO2 98 %. Body mass index is 28.7 kg/m.  EKG: Not performed today.  Indirect Calorimeter completed today shows a VO2 of 191 and a REE of 1310.  Her calculated  basal metabolic rate is 4315 thus her basal metabolic rate is worse than expected.  General: Cooperative, alert, well developed, in no acute distress. HEENT: Conjunctivae and lids unremarkable. Cardiovascular: Regular rhythm.  Lungs: Normal work of breathing. Neurologic: No focal deficits.   Lab Results  Component Value Date   CREATININE 0.74 05/31/2020   BUN 16 05/31/2020   NA 140 05/31/2020   K 4.1 05/31/2020   CL 107 05/31/2020   CO2 27 05/31/2020   Lab Results  Component Value Date   ALT 61 (H) 05/31/2020   AST 38 (H) 05/31/2020   ALKPHOS 54 05/31/2020   BILITOT 0.5 05/31/2020   Lab Results  Component Value Date   HGBA1C 5.1 07/17/2011   Lab Results  Component Value Date   TSH 2.41 05/31/2020   Lab Results  Component Value Date   CHOL 204 (H)  05/31/2020   HDL 39.50 05/31/2020   LDLCALC 61 05/17/2014   LDLDIRECT 116.0 05/31/2020   TRIG 224.0 (H) 05/31/2020   CHOLHDL 5 05/31/2020   Lab Results  Component Value Date   VD25OH 77 07/17/2011   VD25OH 52 07/11/2010   VD25OH 44 12/27/2008   Lab Results  Component Value Date   WBC 4.8 05/31/2020   HGB 14.6 05/31/2020   HCT 42.7 05/31/2020   MCV 95.5 05/31/2020   PLT 191.0 05/31/2020   Attestation Statements:   This is the patient's first visit at [facility]. The patient's NEW PATIENT PACKET was reviewed at length. Included in the packet: current and past health history, medications, allergies, ROS, gynecologic history (women only), surgical history, family history, social history, weight history, weight loss surgery history (for those that have had weight loss surgery), nutritional evaluation, mood and food questionnaire, PHQ9, Epworth questionnaire, sleep habits questionnaire, patient life and health improvement goals questionnaire. These will all be scanned into the patient's chart under media.   During the visit, I independently reviewed the patient's EKG, bioimpedance scale results, and  indirect calorimeter results. I used this information to tailor a meal plan for the patient that will help her to lose weight and will improve her obesity-related conditions going forward. I performed a medically necessary appropriate examination and/or evaluation. I discussed the assessment and treatment plan with the patient. The patient was provided an opportunity to ask questions and all were answered. The patient agreed with the plan and demonstrated an understanding of the instructions. Labs were ordered at this visit and will be reviewed at the next visit unless more critical results need to be addressed immediately. Clinical information was updated and documented in the EMR.   I, Water quality scientist, CMA, am acting as transcriptionist for Briscoe Deutscher, DO  I have reviewed the above documentation for accuracy and completeness, and I agree with the above. Briscoe Deutscher, DO

## 2021-07-17 LAB — CBC WITH DIFFERENTIAL/PLATELET
Basophils Absolute: 0.1 10*3/uL (ref 0.0–0.2)
Basos: 1 %
EOS (ABSOLUTE): 0.1 10*3/uL (ref 0.0–0.4)
Eos: 3 %
Hemoglobin: 13.4 g/dL (ref 11.1–15.9)
Immature Grans (Abs): 0 10*3/uL (ref 0.0–0.1)
Immature Granulocytes: 0 %
Lymphocytes Absolute: 1.6 10*3/uL (ref 0.7–3.1)
Lymphs: 37 %
MCH: 31.8 pg (ref 26.6–33.0)
MCHC: 32.8 g/dL (ref 31.5–35.7)
MCV: 97 fL (ref 79–97)
Monocytes Absolute: 0.4 10*3/uL (ref 0.1–0.9)
Monocytes: 10 %
Neutrophils Absolute: 2.1 10*3/uL (ref 1.4–7.0)
Neutrophils: 49 %
Platelets: 215 10*3/uL (ref 150–450)
RBC: 4.21 x10E6/uL (ref 3.77–5.28)
RDW: 12.9 % (ref 11.7–15.4)
WBC: 4.3 10*3/uL (ref 3.4–10.8)

## 2021-07-17 LAB — LIPID PANEL
Chol/HDL Ratio: 5.2 ratio — ABNORMAL HIGH (ref 0.0–4.4)
Cholesterol, Total: 253 mg/dL — ABNORMAL HIGH (ref 100–199)
HDL: 49 mg/dL (ref >39)
LDL Chol Calc (NIH): 147 mg/dL — ABNORMAL HIGH (ref 0–99)
Triglycerides: 311 mg/dL — ABNORMAL HIGH (ref 0–149)
VLDL Cholesterol Cal: 57 mg/dL — ABNORMAL HIGH (ref 5–40)

## 2021-07-17 LAB — COMPREHENSIVE METABOLIC PANEL
ALT: 33 IU/L — ABNORMAL HIGH (ref 0–32)
AST: 31 IU/L (ref 0–40)
Albumin/Globulin Ratio: 2.6 — ABNORMAL HIGH (ref 1.2–2.2)
Albumin: 5 g/dL — ABNORMAL HIGH (ref 3.8–4.8)
Alkaline Phosphatase: 78 IU/L (ref 44–121)
BUN/Creatinine Ratio: 19 (ref 12–28)
BUN: 14 mg/dL (ref 8–27)
Bilirubin Total: 0.5 mg/dL (ref 0.0–1.2)
CO2: 21 mmol/L (ref 20–29)
Calcium: 9.6 mg/dL (ref 8.7–10.3)
Chloride: 100 mmol/L (ref 96–106)
Creatinine, Ser: 0.75 mg/dL (ref 0.57–1.00)
Globulin, Total: 1.9 g/dL (ref 1.5–4.5)
Glucose: 89 mg/dL (ref 65–99)
Potassium: 4.8 mmol/L (ref 3.5–5.2)
Sodium: 138 mmol/L (ref 134–144)
Total Protein: 6.9 g/dL (ref 6.0–8.5)
eGFR: 89 mL/min/{1.73_m2} (ref >59)

## 2021-07-17 LAB — ANEMIA PANEL
Ferritin: 587 ng/mL — ABNORMAL HIGH (ref 15–150)
Folate, Hemolysate: 532 ng/mL
Folate, RBC: 1304 ng/mL (ref >498)
Hematocrit: 40.8 % (ref 34.0–46.6)
Iron Saturation: 29 % (ref 15–55)
Iron: 102 ug/dL (ref 27–139)
Retic Ct Pct: 1.9 % (ref 0.6–2.6)
Total Iron Binding Capacity: 351 ug/dL (ref 250–450)
UIBC: 249 ug/dL (ref 118–369)
Vitamin B-12: 698 pg/mL (ref 232–1245)

## 2021-07-17 LAB — HEMOGLOBIN A1C
Est. average glucose Bld gHb Est-mCnc: 108 mg/dL
Hgb A1c MFr Bld: 5.4 % (ref 4.8–5.6)

## 2021-07-17 LAB — INSULIN, RANDOM: INSULIN: 10.2 u[IU]/mL (ref 2.6–24.9)

## 2021-07-17 LAB — VITAMIN D 25 HYDROXY (VIT D DEFICIENCY, FRACTURES): Vit D, 25-Hydroxy: 37.2 ng/mL (ref 30.0–100.0)

## 2021-07-17 LAB — T4, FREE: Free T4: 1.29 ng/dL (ref 0.82–1.77)

## 2021-07-17 LAB — TSH: TSH: 1.49 u[IU]/mL (ref 0.450–4.500)

## 2021-07-21 ENCOUNTER — Encounter (INDEPENDENT_AMBULATORY_CARE_PROVIDER_SITE_OTHER): Payer: Self-pay

## 2021-07-29 ENCOUNTER — Encounter (INDEPENDENT_AMBULATORY_CARE_PROVIDER_SITE_OTHER): Payer: Self-pay | Admitting: Family Medicine

## 2021-07-29 ENCOUNTER — Ambulatory Visit (INDEPENDENT_AMBULATORY_CARE_PROVIDER_SITE_OTHER): Payer: BC Managed Care – PPO | Admitting: Family Medicine

## 2021-07-29 ENCOUNTER — Other Ambulatory Visit: Payer: Self-pay

## 2021-07-29 VITALS — BP 107/64 | HR 73 | Temp 98.3°F | Ht 63.0 in | Wt 160.0 lb

## 2021-07-29 DIAGNOSIS — R7401 Elevation of levels of liver transaminase levels: Secondary | ICD-10-CM

## 2021-07-29 DIAGNOSIS — E782 Mixed hyperlipidemia: Secondary | ICD-10-CM | POA: Diagnosis not present

## 2021-07-29 DIAGNOSIS — Z6830 Body mass index (BMI) 30.0-30.9, adult: Secondary | ICD-10-CM

## 2021-07-29 DIAGNOSIS — E559 Vitamin D deficiency, unspecified: Secondary | ICD-10-CM

## 2021-07-29 DIAGNOSIS — E669 Obesity, unspecified: Secondary | ICD-10-CM

## 2021-07-29 DIAGNOSIS — E8881 Metabolic syndrome: Secondary | ICD-10-CM

## 2021-07-30 MED ORDER — TIRZEPATIDE 5 MG/0.5ML ~~LOC~~ SOAJ: 5.0000 mg | SUBCUTANEOUS | 0 refills | Status: DC

## 2021-07-30 MED ORDER — VITAMIN D (ERGOCALCIFEROL) 1.25 MG (50000 UNIT) PO CAPS: 50000.0000 [IU] | ORAL_CAPSULE | ORAL | 0 refills | Status: DC

## 2021-07-31 NOTE — Progress Notes (Signed)
Chief Complaint:   OBESITY Jessica Mack is here to discuss her progress with her obesity treatment plan along with follow-up of her obesity related diagnoses.   Today's visit was #: 2 Starting weight: 162 lbs Starting date: 07/15/2021 Today's weight: 160 lbs Today's date: 07/29/2021 Weight change since last visit: 2 lbs Total lbs lost to date: 2 lbs Body mass index is 28.34 kg/m.  Total weight loss percentage to date: -1.23%  Current Meal Plan: practicing portion control and making smarter food choices, such as increasing vegetables and decreasing simple carbohydrates for 0% of the time.  Current Exercise Plan: Cardio/pilates for 30-60 minutes 4-5 times per week. Current Anti-Obesity Medications: Mounjaro 2.5 mg subcutaneously weekly. Side effects: None.  Interim History:  Izabel has been taking Mounjaro for 1 week and is down a total of 2 pounds.  She is getting in 1300 calories per day.  She says she went to [place], and will be getting her meal-plan food tomorrow.  She is decreasing her wine intake.  Assessment/Plan:   1. Elevated ALT measurement, with fatty liver Improving.   Lab Results  Component Value Date   ALT 33 (H) 07/15/2021   ALT 46 07/09/2014   Treatment goal: 7-10% reduction of body weight.  Aerobic and resistance physical activity are important to help achieve a healthy body weight BUT also increases peripheral insulin sensitivity, reducing delivery of free fatty acids and glucose to the liver. Exercise also increases intrahepatic fatty acid oxidation, decreasing fatty acid synthesis, and helps prevent mitochondrial and hepatocellular damage. Medications that can help reduce hepatic fat include metformin and GLP1RAs.   09/02/18 Abdominal US:  Fatty change of the liver.  Previous cholecystectomy. Multilobular septated cyst at the dome of the liver measuring 4.1 x 2.8 x 3.1 cm. This was present in 2007 and measured maximally 3 cm. This is likely benign and  insignificant.  NAFLD Fibrosis score: -1.84  NAFLD FIBROSIS SCORE  Depending on score and local prevalence of advanced fibrosis, the score can be used to reliably predict (with high 80-low 90% accuracy) which patients are unlikely to have cellular evidence of fibrosis on biopsy.  NAFLD Score and Correlated Fibrosis Severity < -1.455 = F0-F2 -1.455 - 0.675  = Indeterminant score > 0.675 = F3-F4  Fibrosis Severity Scale  F0 = no fibrosis F1 = mild fibrosis F2 = moderate fibrosis F3 = severe fibrosis F4 = cirrhosis   Angulo P, Hui JM, Marchesini G, Bugianesi E, George J, Farrell GC, Enders F, Saksena S, [place] AD, Bida JP, Lindor K, [place] SO, Lenzi M, Adams LA, Kench J, Therneau TM, Day CP. The NAFLD fibrosis score: a noninvasive system that identifies liver fibrosis in patients with NAFLD. Hepatology. 2007 Apr;45(4):846-54. doi: 10.1002/hep.21496. PMID: 16109604.  [place] S, Bjrnsson E, Enders F, [place], Lindor KD. NAFLD fibrosis score: a prognostic predictor for mortality and liver complications among NAFLD patients. World J Gastroenterol. 2013 Feb 28;19(8):1219-29. doi: 10.3748/wjg.v19.i8.1219. PMID: 54098119; PMCID: [id number].  2. Mixed hyperlipidemia Course: Not at goal. Lipid-lowering medications: Lovaza 2 grams twice daily.   Plan: Dietary changes: Increase soluble fiber, decrease simple carbohydrates, decrease saturated fat. Exercise changes: Moderate to vigorous-intensity aerobic activity 150 minutes per week or as tolerated. We will continue to monitor along with PCP/specialists as it pertains to her weight loss journey.  Lab Results  Component Value Date   CHOL 253 (H) 07/15/2021   HDL 49 07/15/2021   LDLCALC 147 (H) 07/15/2021   LDLDIRECT 116.0 05/31/2020   TRIG  311 (H) 07/15/2021   CHOLHDL 5.2 (H) 07/15/2021   Lab Results  Component Value Date   ALT 33 (H) 07/15/2021   AST 31 07/15/2021   ALKPHOS 78 07/15/2021   BILITOT 0.5 07/15/2021   The  10-year ASCVD risk score (Arnett DK, et al., 2019) is: 5.2%   Values used to calculate the score:     Age: 65 years     Sex: Female     Is Non-Hispanic African American: No     Diabetic: No     Tobacco smoker: No     Systolic Blood Pressure: 160 mmHg     Is BP treated: Yes     HDL Cholesterol: 49 mg/dL     Total Cholesterol: 253 mg/dL  3. Insulin resistance, mild, with polyphagia Not at goal. Goal is HgbA1c < 5.7, fasting insulin closer to 5.  Medication: Mounjaro 2.5 mg .subuc weekly.    Plan:  She will continue to focus on protein-rich, low simple carbohydrate foods. We reviewed the importance of hydration, regular exercise for stress reduction, and restorative sleep.   Lab Results  Component Value Date   HGBA1C 5.4 07/15/2021   Lab Results  Component Value Date   INSULIN 10.2 07/15/2021   - Increase and refill tirzepatide [facility]) to 5 MG/0.5ML Pen; Inject 5 mg into the skin once a week.  Dispense: 6 mL; Refill: 0  4. Vitamin D deficiency Not at goal.  She is not currently taking a vitamin D supplement.  Plan: Start to take prescription Vitamin D @50 ,000 IU every two weeks.  Follow-up for routine testing of Vitamin D, at least 2-3 times per year to avoid over-replacement.  Lab Results  Component Value Date   VD25OH 37.2 07/15/2021   VD25OH 77 07/17/2011   VD25OH 52 07/11/2010   - Start Vitamin D, Ergocalciferol, (DRISDOL) 1.25 MG (50000 UNIT) CAPS capsule; Take 1 capsule (50,000 Units total) by mouth every 14 (fourteen) days.  Dispense: 4 capsule; Refill: 0  5. Obesity current BMI 28.4  Course: Marda is currently in the action stage of change. As such, her goal is to continue with weight loss efforts.   Nutrition goals: She has agreed to keeping a food journal and adhering to recommended goals of 1000 calories and 85 grams of protein.   Exercise goals: For substantial health benefits, adults should do at least 150 minutes (2 hours and 30 minutes) a week of  moderate-intensity, or 75 minutes (1 hour and 15 minutes) a week of vigorous-intensity aerobic physical activity, or an equivalent combination of moderate- and vigorous-intensity aerobic activity. Aerobic activity should be performed in episodes of at least 10 minutes, and preferably, it should be spread throughout the week.  Behavioral modification strategies: increasing lean protein intake, decreasing simple carbohydrates, increasing vegetables, increasing water intake, and decreasing liquid calories.  Addison has agreed to follow-up with our clinic in 2-3 weeks. She was informed of the importance of frequent follow-up visits to maximize her success with intensive lifestyle modifications for her multiple health conditions.   Objective:   Blood pressure 107/64, pulse 73, temperature 98.3 F (36.8 C), temperature source Oral, height 5' 3"  (1.6 m), weight 160 lb (72.6 kg), last menstrual period 06/26/2004, SpO2 98 %. Body mass index is 28.34 kg/m.  General: Cooperative, alert, well developed, in no acute distress. HEENT: Conjunctivae and lids unremarkable. Cardiovascular: Regular rhythm.  Lungs: Normal work of breathing. Neurologic: No focal deficits.   Lab Results  Component Value Date   CREATININE  0.75 07/15/2021   BUN 14 07/15/2021   NA 138 07/15/2021   K 4.8 07/15/2021   CL 100 07/15/2021   CO2 21 07/15/2021   Lab Results  Component Value Date   ALT 33 (H) 07/15/2021   AST 31 07/15/2021   ALKPHOS 78 07/15/2021   BILITOT 0.5 07/15/2021   Lab Results  Component Value Date   HGBA1C 5.4 07/15/2021   HGBA1C 5.1 07/17/2011   Lab Results  Component Value Date   INSULIN 10.2 07/15/2021   Lab Results  Component Value Date   TSH 1.490 07/15/2021   Lab Results  Component Value Date   CHOL 253 (H) 07/15/2021   HDL 49 07/15/2021   LDLCALC 147 (H) 07/15/2021   LDLDIRECT 116.0 05/31/2020   TRIG 311 (H) 07/15/2021   CHOLHDL 5.2 (H) 07/15/2021   Lab Results  Component Value  Date   VD25OH 37.2 07/15/2021   VD25OH 77 07/17/2011   VD25OH 52 07/11/2010   Lab Results  Component Value Date   WBC 4.3 07/15/2021   HGB 13.4 07/15/2021   HCT 40.8 07/15/2021   MCV 97 07/15/2021   PLT 215 07/15/2021   Lab Results  Component Value Date   IRON 102 07/15/2021   TIBC 351 07/15/2021   FERRITIN 587 (H) 07/15/2021   Attestation Statements:   Reviewed by clinician on day of visit: allergies, medications, problem list, medical history, surgical history, family history, social history, and previous encounter notes.  Time spent on visit including pre-visit chart review and post-visit care and charting was 45 minutes.  I, Water quality scientist, CMA, am acting as transcriptionist for Briscoe Deutscher, DO  I have reviewed the above documentation for accuracy and completeness, and I agree with the above. Briscoe Deutscher, DO

## 2021-08-05 ENCOUNTER — Encounter (INDEPENDENT_AMBULATORY_CARE_PROVIDER_SITE_OTHER): Payer: Self-pay | Admitting: Family Medicine

## 2021-08-05 DIAGNOSIS — F4323 Adjustment disorder with mixed anxiety and depressed mood: Secondary | ICD-10-CM | POA: Diagnosis not present

## 2021-08-05 NOTE — Telephone Encounter (Signed)
Dr.Wallace °

## 2021-08-12 ENCOUNTER — Other Ambulatory Visit: Payer: Self-pay | Admitting: Obstetrics & Gynecology

## 2021-08-12 DIAGNOSIS — Z1231 Encounter for screening mammogram for malignant neoplasm of breast: Secondary | ICD-10-CM

## 2021-08-21 ENCOUNTER — Other Ambulatory Visit (HOSPITAL_BASED_OUTPATIENT_CLINIC_OR_DEPARTMENT_OTHER): Payer: Self-pay | Admitting: Obstetrics & Gynecology

## 2021-08-21 ENCOUNTER — Other Ambulatory Visit: Payer: Self-pay | Admitting: Internal Medicine

## 2021-08-21 ENCOUNTER — Encounter (HOSPITAL_BASED_OUTPATIENT_CLINIC_OR_DEPARTMENT_OTHER): Payer: Self-pay

## 2021-08-21 DIAGNOSIS — K7581 Nonalcoholic steatohepatitis (NASH): Secondary | ICD-10-CM

## 2021-08-21 MED ORDER — ESTRADIOL 10 MCG VA TABS: ORAL_TABLET | VAGINAL | 1 refills | Status: DC

## 2021-08-26 ENCOUNTER — Ambulatory Visit (INDEPENDENT_AMBULATORY_CARE_PROVIDER_SITE_OTHER): Payer: BC Managed Care – PPO | Admitting: Family Medicine

## 2021-08-26 ENCOUNTER — Other Ambulatory Visit: Payer: Self-pay

## 2021-08-26 ENCOUNTER — Encounter (INDEPENDENT_AMBULATORY_CARE_PROVIDER_SITE_OTHER): Payer: Self-pay | Admitting: Family Medicine

## 2021-08-26 VITALS — BP 101/65 | HR 80 | Temp 98.1°F | Ht 63.0 in | Wt 159.0 lb

## 2021-08-26 DIAGNOSIS — K7581 Nonalcoholic steatohepatitis (NASH): Secondary | ICD-10-CM

## 2021-08-26 DIAGNOSIS — E669 Obesity, unspecified: Secondary | ICD-10-CM | POA: Diagnosis not present

## 2021-08-26 DIAGNOSIS — E8881 Metabolic syndrome: Secondary | ICD-10-CM

## 2021-08-26 DIAGNOSIS — E88819 Insulin resistance, unspecified: Secondary | ICD-10-CM

## 2021-08-26 DIAGNOSIS — Z6830 Body mass index (BMI) 30.0-30.9, adult: Secondary | ICD-10-CM

## 2021-08-26 DIAGNOSIS — E782 Mixed hyperlipidemia: Secondary | ICD-10-CM | POA: Diagnosis not present

## 2021-08-27 ENCOUNTER — Other Ambulatory Visit: Payer: Self-pay | Admitting: Internal Medicine

## 2021-08-27 DIAGNOSIS — E781 Pure hyperglyceridemia: Secondary | ICD-10-CM

## 2021-08-27 DIAGNOSIS — F4323 Adjustment disorder with mixed anxiety and depressed mood: Secondary | ICD-10-CM | POA: Diagnosis not present

## 2021-08-27 NOTE — Progress Notes (Signed)
Chief Complaint:   OBESITY Jessica Mack is here to discuss her progress with her obesity treatment plan along with follow-up of her obesity related diagnoses. See Medical Weight Management Flowsheet for complete bioelectrical impedance results.  Today's visit was #: 3 Starting weight: 162 lbs Starting date: 07/15/2021 Weight change since last visit: 1 lb Total lbs lost to date: 3 lbs Total weight loss percentage to date: -1.85%  Nutrition Plan: Keeping a food journal and adhering to recommended goals of 1000 calories and 85 grams of protein daily for 0% of the time. Activity: Walking/pilates for 50 minutes 4-5 times per week.  Anti-obesity medications: Mounjaro 5 mg subcutaneously weekly. Reported side effects: None.  Interim History: Jessica Mack says she enjoyed her son's wedding over the last week.  She is tolerating her medications with no side effects.  Assessment/Plan:   1. Mixed hyperlipidemia Course: Not at goal. Lipid-lowering medications: None.   Plan: Dietary changes: Increase soluble fiber, decrease simple carbohydrates, decrease saturated fat. Exercise changes: Moderate to vigorous-intensity aerobic activity 150 minutes per week or as tolerated. We will continue to monitor along with PCP/specialists as it pertains to her weight loss journey.  Lab Results  Component Value Date   CHOL 253 (H) 07/15/2021   HDL 49 07/15/2021   LDLCALC 147 (H) 07/15/2021   LDLDIRECT 116.0 05/31/2020   TRIG 311 (H) 07/15/2021   CHOLHDL 5.2 (H) 07/15/2021   Lab Results  Component Value Date   ALT 33 (H) 07/15/2021   AST 31 07/15/2021   ALKPHOS 78 07/15/2021   BILITOT 0.5 07/15/2021   The 10-year ASCVD risk score (Arnett DK, et al., 2019) is: 9%   Values used to calculate the score:     Age: 65 years     Sex: Female     Is Non-Hispanic African American: No     Diabetic: No     Tobacco smoker: Yes     Systolic Blood Pressure: 789 mmHg     Is BP treated: Yes     HDL Cholesterol: 49 mg/dL      Total Cholesterol: 253 mg/dL  2. Insulin resistance, mild, with polyphagia Not at goal. Goal is HgbA1c < 5.7, fasting insulin closer to 5.  Medication: Mounjaro 5 mg subcutaneously weekly.    Plan:  Continue Mounjaro 5 mg subcutaneously weekly.  She will continue to focus on protein-rich, low simple carbohydrate foods. We reviewed the importance of hydration, regular exercise for stress reduction, and restorative sleep.   Lab Results  Component Value Date   HGBA1C 5.4 07/15/2021   Lab Results  Component Value Date   INSULIN 10.2 07/15/2021   3. NASH (nonalcoholic steatohepatitis) The mainstay of treatment of NAFLD includes lifestyle modification to achieve weight loss, at least 7% of current body weight. Low carbohydrate diets can be beneficial in improving NAFLD liver histology. Additionally, exercise, even the absence of weight loss can have beneficial effects on the patient's metabolic profile and liver health.  4. Obesity, current BMI 28.3  Course: Jessica Mack is currently in the action stage of change. As such, her goal is to continue with weight loss efforts.   Nutrition goals: She has agreed to keeping a food journal and adhering to recommended goals of 1000 calories and 85 grams of protein.   Exercise goals:  As is.  Behavioral modification strategies: increasing lean protein intake, decreasing simple carbohydrates, increasing vegetables, increasing water intake, and emotional eating strategies.  Jessica Mack has agreed to follow-up with our clinic in 4  weeks. She was informed of the importance of frequent follow-up visits to maximize her success with intensive lifestyle modifications for her multiple health conditions.   Objective:   Blood pressure 101/65, pulse 80, temperature 98.1 F (36.7 C), temperature source Oral, height 5' 3"  (1.6 m), weight 159 lb (72.1 kg), last menstrual period 06/26/2004, SpO2 98 %. Body mass index is 28.17 kg/m.  General: Cooperative, alert, well  developed, in no acute distress. HEENT: Conjunctivae and lids unremarkable. Cardiovascular: Regular rhythm.  Lungs: Normal work of breathing. Neurologic: No focal deficits.   Lab Results  Component Value Date   CREATININE 0.75 07/15/2021   BUN 14 07/15/2021   NA 138 07/15/2021   K 4.8 07/15/2021   CL 100 07/15/2021   CO2 21 07/15/2021   Lab Results  Component Value Date   ALT 33 (H) 07/15/2021   AST 31 07/15/2021   ALKPHOS 78 07/15/2021   BILITOT 0.5 07/15/2021   Lab Results  Component Value Date   HGBA1C 5.4 07/15/2021   HGBA1C 5.1 07/17/2011   Lab Results  Component Value Date   INSULIN 10.2 07/15/2021   Lab Results  Component Value Date   TSH 1.490 07/15/2021   Lab Results  Component Value Date   CHOL 253 (H) 07/15/2021   HDL 49 07/15/2021   LDLCALC 147 (H) 07/15/2021   LDLDIRECT 116.0 05/31/2020   TRIG 311 (H) 07/15/2021   CHOLHDL 5.2 (H) 07/15/2021   Lab Results  Component Value Date   VD25OH 37.2 07/15/2021   VD25OH 77 07/17/2011   VD25OH 52 07/11/2010   Lab Results  Component Value Date   WBC 4.3 07/15/2021   HGB 13.4 07/15/2021   HCT 40.8 07/15/2021   MCV 97 07/15/2021   PLT 215 07/15/2021   Lab Results  Component Value Date   IRON 102 07/15/2021   TIBC 351 07/15/2021   FERRITIN 587 (H) 07/15/2021   Attestation Statements:   Reviewed by clinician on day of visit: allergies, medications, problem list, medical history, surgical history, family history, social history, and previous encounter notes.  I, Water quality scientist, CMA, am acting as transcriptionist for Briscoe Deutscher, DO  I have reviewed the above documentation for accuracy and completeness, and I agree with the above. -  Briscoe Deutscher, DO, MS, FAAFP, DABOM - Family and Bariatric Medicine.

## 2021-09-04 DIAGNOSIS — H01119 Allergic dermatitis of unspecified eye, unspecified eyelid: Secondary | ICD-10-CM | POA: Diagnosis not present

## 2021-09-04 DIAGNOSIS — L821 Other seborrheic keratosis: Secondary | ICD-10-CM | POA: Diagnosis not present

## 2021-09-04 DIAGNOSIS — L219 Seborrheic dermatitis, unspecified: Secondary | ICD-10-CM | POA: Diagnosis not present

## 2021-09-04 DIAGNOSIS — L578 Other skin changes due to chronic exposure to nonionizing radiation: Secondary | ICD-10-CM | POA: Diagnosis not present

## 2021-09-09 DIAGNOSIS — F4323 Adjustment disorder with mixed anxiety and depressed mood: Secondary | ICD-10-CM | POA: Diagnosis not present

## 2021-09-28 ENCOUNTER — Other Ambulatory Visit: Payer: Self-pay | Admitting: Internal Medicine

## 2021-09-28 DIAGNOSIS — I10 Essential (primary) hypertension: Secondary | ICD-10-CM

## 2021-09-30 ENCOUNTER — Ambulatory Visit
Admission: RE | Admit: 2021-09-30 | Discharge: 2021-09-30 | Disposition: A | Discharge status: Home / Self Care | Payer: BC Managed Care – PPO | Source: Ambulatory Visit | Attending: Obstetrics & Gynecology | Admitting: Obstetrics & Gynecology

## 2021-09-30 DIAGNOSIS — F4323 Adjustment disorder with mixed anxiety and depressed mood: Secondary | ICD-10-CM | POA: Diagnosis not present

## 2021-09-30 DIAGNOSIS — Z1231 Encounter for screening mammogram for malignant neoplasm of breast: Secondary | ICD-10-CM

## 2021-10-01 ENCOUNTER — Encounter (INDEPENDENT_AMBULATORY_CARE_PROVIDER_SITE_OTHER): Payer: Self-pay | Admitting: Family Medicine

## 2021-10-01 ENCOUNTER — Other Ambulatory Visit: Payer: Self-pay

## 2021-10-01 ENCOUNTER — Ambulatory Visit (INDEPENDENT_AMBULATORY_CARE_PROVIDER_SITE_OTHER): Payer: BC Managed Care – PPO | Admitting: Family Medicine

## 2021-10-01 VITALS — BP 83/58 | HR 63 | Temp 97.5°F | Ht 63.0 in | Wt 153.0 lb

## 2021-10-01 DIAGNOSIS — E669 Obesity, unspecified: Secondary | ICD-10-CM | POA: Diagnosis not present

## 2021-10-01 DIAGNOSIS — I10 Essential (primary) hypertension: Secondary | ICD-10-CM

## 2021-10-01 DIAGNOSIS — E8881 Metabolic syndrome: Secondary | ICD-10-CM | POA: Diagnosis not present

## 2021-10-01 DIAGNOSIS — F3289 Other specified depressive episodes: Secondary | ICD-10-CM

## 2021-10-01 DIAGNOSIS — Z6830 Body mass index (BMI) 30.0-30.9, adult: Secondary | ICD-10-CM

## 2021-10-01 MED ORDER — TIRZEPATIDE 5 MG/0.5ML ~~LOC~~ SOAJ: 5.0000 mg | SUBCUTANEOUS | 0 refills | Status: DC

## 2021-10-02 NOTE — Progress Notes (Signed)
Chief Complaint:   OBESITY Jessica Mack is here to discuss her progress with her obesity treatment plan along with follow-up of her obesity related diagnoses. See Medical Weight Management Flowsheet for complete bioelectrical impedance results.  Today's visit was #: 4 Starting weight: 162 lbs Starting date: 07/15/2021 Weight change since last visit: 6 lbs Total lbs lost to date: 9 lbs Total weight loss percentage to date: -5.56%  Nutrition Plan: Keeping a food journal and adhering to recommended goals of 1000 calories and 85 grams of protein daily for 50% of the time. Activity: Cardio/pilates for 50+ minutes 4-5 times per week Anti-obesity medications: Mounjaro 5 mg subcutaneously weekly. Reported side effects: None.  Interim History: Jessica Mack says she loves [employer] - cheaper at [employer].  She says she can do a 5 minute plank.  She will be traveling at the end of the year while her kitchen is being renovated.  Assessment/Plan:   1. Insulin resistance, mild, with polyphagia At goal. Goal is HgbA1c < 5.7, fasting insulin closer to 5.  Medication: Mounjaro 5 mg subcutaneously weekly.    Plan:  Continue Mounjaro 5 mg subcutaneously weekly.  Will refill today.  She will continue to focus on protein-rich, low simple carbohydrate foods. We reviewed the importance of hydration, regular exercise for stress reduction, and restorative sleep.   Lab Results  Component Value Date   HGBA1C 5.4 07/15/2021   Lab Results  Component Value Date   INSULIN 10.2 07/15/2021   - Refill tirzepatide (MOUNJARO) 5 MG/0.5ML Pen; Inject 5 mg into the skin once a week.  Dispense: 6 mL; Refill: 0  2. Essential hypertension Low today. Medications: Bystolic 5 mg dialy, Benicar 20 mg daily.   Plan: Decrease Benicar by half.  Avoid buying foods that are: processed, frozen, or prepackaged to avoid excess salt.   BP Readings from Last 3 Encounters:  10/01/21 (!) 83/58  08/26/21 101/65  07/29/21 107/64    Lab Results  Component Value Date   CREATININE 0.75 07/15/2021   3. Other depression, with emotional eating Controlled. Medication: None.   Plan: Discussed cues and consequences, how thoughts affect eating, model of thoughts, feelings, and behaviors, and strategies for change by focusing on the cue. Discussed cognitive distortions, coping thoughts, and how to change your thoughts.  4. Obesity, current BMI 27.1  Course: Jessica Mack is currently in the action stage of change. As such, her goal is to continue with weight loss efforts.   Nutrition goals: She has agreed to keeping a food journal and adhering to recommended goals of 1000 calories and 85 grams of protein.   Exercise goals:  As is.  Behavioral modification strategies: increasing lean protein intake, decreasing simple carbohydrates, increasing vegetables, increasing water intake, and decreasing liquid calories.  Jessica Mack has agreed to follow-up with our clinic in 6 weeks. She was informed of the importance of frequent follow-up visits to maximize her success with intensive lifestyle modifications for her multiple health conditions.   Objective:   Blood pressure (!) 83/58, pulse 63, temperature (!) 97.5 F (36.4 C), temperature source Oral, height 5' 3"  (1.6 m), weight 153 lb (69.4 kg), last menstrual period 06/26/2004, SpO2 97 %. Body mass index is 27.1 kg/m.  General: Cooperative, alert, well developed, in no acute distress. HEENT: Conjunctivae and lids unremarkable. Cardiovascular: Regular rhythm.  Lungs: Normal work of breathing. Neurologic: No focal deficits.   Lab Results  Component Value Date   CREATININE 0.75 07/15/2021   BUN 14 07/15/2021  NA 138 07/15/2021   K 4.8 07/15/2021   CL 100 07/15/2021   CO2 21 07/15/2021   Lab Results  Component Value Date   ALT 33 (H) 07/15/2021   AST 31 07/15/2021   ALKPHOS 78 07/15/2021   BILITOT 0.5 07/15/2021   Lab Results  Component Value Date   HGBA1C 5.4 07/15/2021    HGBA1C 5.1 07/17/2011   Lab Results  Component Value Date   INSULIN 10.2 07/15/2021   Lab Results  Component Value Date   TSH 1.490 07/15/2021   Lab Results  Component Value Date   CHOL 253 (H) 07/15/2021   HDL 49 07/15/2021   LDLCALC 147 (H) 07/15/2021   LDLDIRECT 116.0 05/31/2020   TRIG 311 (H) 07/15/2021   CHOLHDL 5.2 (H) 07/15/2021   Lab Results  Component Value Date   VD25OH 37.2 07/15/2021   VD25OH 77 07/17/2011   VD25OH 52 07/11/2010   Lab Results  Component Value Date   WBC 4.3 07/15/2021   HGB 13.4 07/15/2021   HCT 40.8 07/15/2021   MCV 97 07/15/2021   PLT 215 07/15/2021   Lab Results  Component Value Date   IRON 102 07/15/2021   TIBC 351 07/15/2021   FERRITIN 587 (H) 07/15/2021   Attestation Statements:   Reviewed by clinician on day of visit: allergies, medications, problem list, medical history, surgical history, family history, social history, and previous encounter notes.  I, Water quality scientist, CMA, am acting as transcriptionist for Briscoe Deutscher, DO  I have reviewed the above documentation for accuracy and completeness, and I agree with the above. -  Briscoe Deutscher, DO, MS, FAAFP, DABOM - Family and Bariatric Medicine.

## 2021-10-08 DIAGNOSIS — F4323 Adjustment disorder with mixed anxiety and depressed mood: Secondary | ICD-10-CM | POA: Diagnosis not present

## 2021-10-15 ENCOUNTER — Other Ambulatory Visit (INDEPENDENT_AMBULATORY_CARE_PROVIDER_SITE_OTHER): Payer: Self-pay

## 2021-10-15 DIAGNOSIS — I10 Essential (primary) hypertension: Secondary | ICD-10-CM | POA: Diagnosis not present

## 2021-10-15 DIAGNOSIS — E782 Mixed hyperlipidemia: Secondary | ICD-10-CM | POA: Diagnosis not present

## 2021-10-15 DIAGNOSIS — E8881 Metabolic syndrome: Secondary | ICD-10-CM

## 2021-10-15 DIAGNOSIS — E559 Vitamin D deficiency, unspecified: Secondary | ICD-10-CM

## 2021-10-15 DIAGNOSIS — R5383 Other fatigue: Secondary | ICD-10-CM

## 2021-10-15 DIAGNOSIS — R6889 Other general symptoms and signs: Secondary | ICD-10-CM | POA: Diagnosis not present

## 2021-10-16 LAB — CBC WITH DIFFERENTIAL/PLATELET
Basophils Absolute: 0.1 10*3/uL (ref 0.0–0.2)
Basos: 1 %
EOS (ABSOLUTE): 0.1 10*3/uL (ref 0.0–0.4)
Eos: 3 %
Hemoglobin: 13.9 g/dL (ref 11.1–15.9)
Immature Grans (Abs): 0 10*3/uL (ref 0.0–0.1)
Immature Granulocytes: 0 %
Lymphocytes Absolute: 1.7 10*3/uL (ref 0.7–3.1)
Lymphs: 43 %
MCH: 31 pg (ref 26.6–33.0)
MCHC: 32.9 g/dL (ref 31.5–35.7)
MCV: 94 fL (ref 79–97)
Monocytes Absolute: 0.5 10*3/uL (ref 0.1–0.9)
Monocytes: 13 %
Neutrophils Absolute: 1.6 10*3/uL (ref 1.4–7.0)
Neutrophils: 40 %
Platelets: 224 10*3/uL (ref 150–450)
RBC: 4.48 x10E6/uL (ref 3.77–5.28)
RDW: 12.1 % (ref 11.7–15.4)
WBC: 4 10*3/uL (ref 3.4–10.8)

## 2021-10-16 LAB — COMPREHENSIVE METABOLIC PANEL
ALT: 39 IU/L — ABNORMAL HIGH (ref 0–32)
AST: 33 IU/L (ref 0–40)
Albumin/Globulin Ratio: 2.6 — ABNORMAL HIGH (ref 1.2–2.2)
Albumin: 5.1 g/dL — ABNORMAL HIGH (ref 3.8–4.8)
Alkaline Phosphatase: 72 IU/L (ref 44–121)
BUN/Creatinine Ratio: 27 (ref 12–28)
BUN: 22 mg/dL (ref 8–27)
Bilirubin Total: 0.5 mg/dL (ref 0.0–1.2)
CO2: 21 mmol/L (ref 20–29)
Calcium: 10.1 mg/dL (ref 8.7–10.3)
Chloride: 97 mmol/L (ref 96–106)
Creatinine, Ser: 0.83 mg/dL (ref 0.57–1.00)
Globulin, Total: 2 g/dL (ref 1.5–4.5)
Glucose: 95 mg/dL (ref 70–99)
Potassium: 4.5 mmol/L (ref 3.5–5.2)
Sodium: 137 mmol/L (ref 134–144)
Total Protein: 7.1 g/dL (ref 6.0–8.5)
eGFR: 79 mL/min/{1.73_m2} (ref >59)

## 2021-10-16 LAB — INSULIN, RANDOM: INSULIN: 22.4 u[IU]/mL (ref 2.6–24.9)

## 2021-10-16 LAB — ANEMIA PANEL
Ferritin: 645 ng/mL — ABNORMAL HIGH (ref 15–150)
Folate, Hemolysate: >620 ng/mL
Folate, RBC: >1466 ng/mL (ref >498)
Hematocrit: 42.3 % (ref 34.0–46.6)
Iron Saturation: 30 % (ref 15–55)
Iron: 99 ug/dL (ref 27–139)
Retic Ct Pct: 1.7 % (ref 0.6–2.6)
Total Iron Binding Capacity: 325 ug/dL (ref 250–450)
UIBC: 226 ug/dL (ref 118–369)
Vitamin B-12: 773 pg/mL (ref 232–1245)

## 2021-10-16 LAB — LIPID PANEL WITH LDL/HDL RATIO
Cholesterol, Total: 226 mg/dL — ABNORMAL HIGH (ref 100–199)
HDL: 53 mg/dL (ref >39)
LDL Chol Calc (NIH): 138 mg/dL — ABNORMAL HIGH (ref 0–99)
LDL/HDL Ratio: 2.6 ratio (ref 0.0–3.2)
Triglycerides: 194 mg/dL — ABNORMAL HIGH (ref 0–149)
VLDL Cholesterol Cal: 35 mg/dL (ref 5–40)

## 2021-10-16 LAB — T4, FREE: Free T4: 1.33 ng/dL (ref 0.82–1.77)

## 2021-10-16 LAB — HEMOGLOBIN A1C
Est. average glucose Bld gHb Est-mCnc: 105 mg/dL
Hgb A1c MFr Bld: 5.3 % (ref 4.8–5.6)

## 2021-10-16 LAB — TSH: TSH: 1.68 u[IU]/mL (ref 0.450–4.500)

## 2021-10-16 LAB — VITAMIN D 25 HYDROXY (VIT D DEFICIENCY, FRACTURES): Vit D, 25-Hydroxy: 47.1 ng/mL (ref 30.0–100.0)

## 2021-10-31 ENCOUNTER — Encounter (INDEPENDENT_AMBULATORY_CARE_PROVIDER_SITE_OTHER): Payer: Self-pay | Admitting: Family Medicine

## 2021-10-31 DIAGNOSIS — E161 Other hypoglycemia: Secondary | ICD-10-CM

## 2021-11-03 NOTE — Telephone Encounter (Signed)
Pt last seen by Dr. Wallace.  

## 2021-11-06 MED ORDER — DEXCOM G6 TRANSMITTER MISC: 3 refills | Status: DC

## 2021-11-06 MED ORDER — DEXCOM G6 SENSOR MISC: 3 refills | Status: DC

## 2021-11-06 MED ORDER — DEXCOM G6 RECEIVER DEVI: 0 refills | Status: DC

## 2021-11-18 DIAGNOSIS — F4323 Adjustment disorder with mixed anxiety and depressed mood: Secondary | ICD-10-CM | POA: Diagnosis not present

## 2021-11-19 ENCOUNTER — Other Ambulatory Visit: Payer: Self-pay | Admitting: *Deleted

## 2021-11-19 MED ORDER — BACLOFEN 10 MG PO TABS: 5.0000 mg | ORAL_TABLET | Freq: Every evening | ORAL | 1 refills | Status: DC | PRN

## 2021-11-23 ENCOUNTER — Other Ambulatory Visit: Payer: Self-pay | Admitting: Internal Medicine

## 2021-11-23 DIAGNOSIS — G620 Drug-induced polyneuropathy: Secondary | ICD-10-CM

## 2021-11-24 ENCOUNTER — Encounter (INDEPENDENT_AMBULATORY_CARE_PROVIDER_SITE_OTHER): Payer: Self-pay

## 2021-11-25 ENCOUNTER — Encounter (INDEPENDENT_AMBULATORY_CARE_PROVIDER_SITE_OTHER): Payer: Self-pay

## 2021-11-25 ENCOUNTER — Ambulatory Visit (INDEPENDENT_AMBULATORY_CARE_PROVIDER_SITE_OTHER): Payer: BC Managed Care – PPO | Admitting: Family Medicine

## 2021-12-01 ENCOUNTER — Ambulatory Visit (INDEPENDENT_AMBULATORY_CARE_PROVIDER_SITE_OTHER): Payer: BC Managed Care – PPO | Admitting: Family Medicine

## 2021-12-03 ENCOUNTER — Encounter (INDEPENDENT_AMBULATORY_CARE_PROVIDER_SITE_OTHER): Payer: Self-pay | Admitting: Family Medicine

## 2021-12-03 ENCOUNTER — Other Ambulatory Visit: Payer: Self-pay

## 2021-12-03 ENCOUNTER — Ambulatory Visit (INDEPENDENT_AMBULATORY_CARE_PROVIDER_SITE_OTHER): Payer: BC Managed Care – PPO | Admitting: Family Medicine

## 2021-12-03 VITALS — BP 95/62 | HR 73 | Temp 97.9°F | Ht 63.0 in | Wt 148.8 lb

## 2021-12-03 DIAGNOSIS — E782 Mixed hyperlipidemia: Secondary | ICD-10-CM | POA: Diagnosis not present

## 2021-12-03 DIAGNOSIS — E669 Obesity, unspecified: Secondary | ICD-10-CM

## 2021-12-03 DIAGNOSIS — E8881 Metabolic syndrome: Secondary | ICD-10-CM | POA: Diagnosis not present

## 2021-12-03 DIAGNOSIS — F4323 Adjustment disorder with mixed anxiety and depressed mood: Secondary | ICD-10-CM | POA: Diagnosis not present

## 2021-12-03 DIAGNOSIS — Z6826 Body mass index (BMI) 26.0-26.9, adult: Secondary | ICD-10-CM

## 2021-12-03 DIAGNOSIS — E161 Other hypoglycemia: Secondary | ICD-10-CM

## 2021-12-03 DIAGNOSIS — E559 Vitamin D deficiency, unspecified: Secondary | ICD-10-CM

## 2021-12-03 DIAGNOSIS — Z6830 Body mass index (BMI) 30.0-30.9, adult: Secondary | ICD-10-CM

## 2021-12-03 MED ORDER — TIRZEPATIDE 7.5 MG/0.5ML ~~LOC~~ SOAJ: 7.5000 mg | SUBCUTANEOUS | 1 refills | Status: DC

## 2021-12-03 MED ORDER — TIRZEPATIDE 5 MG/0.5ML ~~LOC~~ SOAJ: 5.0000 mg | SUBCUTANEOUS | 0 refills | Status: DC

## 2021-12-03 NOTE — Progress Notes (Signed)
Chief Complaint:   OBESITY Consepcion is here to discuss her progress with her obesity treatment plan along with follow-up of her obesity related diagnoses. See Medical Weight Management Flowsheet for complete bioelectrical impedance results.  Today's visit was #: 5 Starting weight: 162 lbs Starting date: 07/15/2021 Weight change since last visit: 5 lbs Total lbs lost to date: 14 lbs Total weight loss percentage to date: -8.64%  Nutrition Plan: Keeping a food journal and adhering to recommended goals of 1000 calories and 85 grams of protein daily for 60% of the time. Activity: Walking/pilates/swimming for 45 minutes 5 times per week. Anti-obesity medications: Mounjaro 5 mg subcutaneously weekly. Reported side effects: None.  Interim History: Jessica Mack says she is not snacking as much.  She has been focused on protein.  She has started swimming.  Her kitchen renovation continues.    Assessment/Plan:   1. Insulin resistance, mild, with polyphagia Improving, but not optimized. Goal is HgbA1c < 5.7, fasting insulin closer to 5.  Medication: Mounjaro 5 mg subcutaneously weekly.    Plan:  She will continue to focus on protein-rich, low simple carbohydrate foods. We reviewed the importance of hydration, regular exercise for stress reduction, and restorative sleep.   Lab Results  Component Value Date   HGBA1C 5.3 10/15/2021   Lab Results  Component Value Date   INSULIN 22.4 10/15/2021   INSULIN 10.2 07/15/2021   - Refill tirzepatide (MOUNJARO) 5 MG/0.5ML Pen; Inject 5 mg into the skin once a week.  Dispense: 6 mL; Refill: 0 - Increase tirzepatide (MOUNJARO) 7.5 MG/0.5ML Pen; Inject 7.5 mg into the skin once a week.  Dispense: 6 mL; Refill: 1  2. Hypoglycemic reaction, unaware Recommend she eat smaller meals throughout the day and increase water intake to prevent hypoglycemia. Will obtain DEXCOM for trial.   3. Vitamin D deficiency Not optimized. She is taking a daily  multivitamin.  Plan: Continue current OTC vitamin D supplementation.  Follow-up for routine testing of Vitamin D, at least 2-3 times per year to avoid over-replacement.  Lab Results  Component Value Date   VD25OH 47.1 10/15/2021   VD25OH 37.2 07/15/2021   VD25OH 77 07/17/2011   4. Mixed hyperlipidemia Course: Not at goal. Lipid-lowering medications: None.   Plan: Dietary changes: Increase soluble fiber, decrease simple carbohydrates, decrease saturated fat. Exercise changes: Moderate to vigorous-intensity aerobic activity 150 minutes per week or as tolerated.   Lab Results  Component Value Date   CHOL 226 (H) 10/15/2021   HDL 53 10/15/2021   LDLCALC 138 (H) 10/15/2021   LDLDIRECT 116.0 05/31/2020   TRIG 194 (H) 10/15/2021   CHOLHDL 5.2 (H) 07/15/2021   Lab Results  Component Value Date   ALT 39 (H) 10/15/2021   AST 33 10/15/2021   ALKPHOS 72 10/15/2021   BILITOT 0.5 10/15/2021   The 10-year ASCVD risk score (Arnett DK, et al., 2019) is: 7.1%   Values used to calculate the score:     Age: 66 years     Sex: Female     Is Non-Hispanic African American: No     Diabetic: No     Tobacco smoker: Yes     Systolic Blood Pressure: 95 mmHg     Is BP treated: Yes     HDL Cholesterol: 53 mg/dL     Total Cholesterol: 226 mg/dL  5. Obesity, current BMI 26.3  Course: Jessica Mack is currently in the action stage of change. As such, her goal is to continue with weight  loss efforts.   Nutrition goals: She has agreed to keeping a food journal and adhering to recommended goals of 1000 calories and 85 grams of protein.   Exercise goals:  As is.  Behavioral modification strategies: increasing lean protein intake, decreasing simple carbohydrates, increasing vegetables, increasing water intake, and decreasing alcohol intake.  Jessica Mack has agreed to follow-up with our clinic in 4 weeks. She was informed of the importance of frequent follow-up visits to maximize her success with intensive lifestyle  modifications for her multiple health conditions.   Objective:   Blood pressure 95/62, pulse 73, temperature 97.9 F (36.6 C), temperature source Oral, height 5' 3"  (1.6 m), weight 148 lb 12.8 oz (67.5 kg), last menstrual period 06/26/2004, SpO2 99 %. Body mass index is 26.36 kg/m.  General: Cooperative, alert, well developed, in no acute distress. HEENT: Conjunctivae and lids unremarkable. Cardiovascular: Regular rhythm.  Lungs: Normal work of breathing. Neurologic: No focal deficits.   Lab Results  Component Value Date   CREATININE 0.83 10/15/2021   BUN 22 10/15/2021   NA 137 10/15/2021   K 4.5 10/15/2021   CL 97 10/15/2021   CO2 21 10/15/2021   Lab Results  Component Value Date   ALT 39 (H) 10/15/2021   AST 33 10/15/2021   ALKPHOS 72 10/15/2021   BILITOT 0.5 10/15/2021   Lab Results  Component Value Date   HGBA1C 5.3 10/15/2021   HGBA1C 5.4 07/15/2021   HGBA1C 5.1 07/17/2011   Lab Results  Component Value Date   INSULIN 22.4 10/15/2021   INSULIN 10.2 07/15/2021   Lab Results  Component Value Date   TSH 1.680 10/15/2021   Lab Results  Component Value Date   CHOL 226 (H) 10/15/2021   HDL 53 10/15/2021   LDLCALC 138 (H) 10/15/2021   LDLDIRECT 116.0 05/31/2020   TRIG 194 (H) 10/15/2021   CHOLHDL 5.2 (H) 07/15/2021   Lab Results  Component Value Date   VD25OH 47.1 10/15/2021   VD25OH 37.2 07/15/2021   VD25OH 77 07/17/2011   Lab Results  Component Value Date   WBC 4.0 10/15/2021   HGB 13.9 10/15/2021   HCT 42.3 10/15/2021   MCV 94 10/15/2021   PLT 224 10/15/2021   Lab Results  Component Value Date   IRON 99 10/15/2021   TIBC 325 10/15/2021   FERRITIN 645 (H) 10/15/2021   Attestation Statements:   Reviewed by clinician on day of visit: allergies, medications, problem list, medical history, surgical history, family history, social history, and previous encounter notes.  I, Water quality scientist, CMA, am acting as transcriptionist for Briscoe Deutscher,  DO  I have reviewed the above documentation for accuracy and completeness, and I agree with the above. -  Briscoe Deutscher, DO, MS, FAAFP, DABOM - Family and Bariatric Medicine.

## 2021-12-09 ENCOUNTER — Encounter (INDEPENDENT_AMBULATORY_CARE_PROVIDER_SITE_OTHER): Payer: Self-pay

## 2021-12-09 ENCOUNTER — Encounter (INDEPENDENT_AMBULATORY_CARE_PROVIDER_SITE_OTHER): Payer: Self-pay | Admitting: Family Medicine

## 2021-12-09 NOTE — Telephone Encounter (Signed)
Dr.Wallace °

## 2021-12-11 ENCOUNTER — Other Ambulatory Visit: Payer: Self-pay | Admitting: Internal Medicine

## 2021-12-11 DIAGNOSIS — I10 Essential (primary) hypertension: Secondary | ICD-10-CM

## 2021-12-16 DIAGNOSIS — F4323 Adjustment disorder with mixed anxiety and depressed mood: Secondary | ICD-10-CM | POA: Diagnosis not present

## 2021-12-22 ENCOUNTER — Encounter (INDEPENDENT_AMBULATORY_CARE_PROVIDER_SITE_OTHER): Payer: Self-pay | Admitting: Family Medicine

## 2021-12-22 NOTE — Telephone Encounter (Signed)
Dr.Wallace °

## 2022-01-06 ENCOUNTER — Encounter: Payer: Self-pay | Admitting: Internal Medicine

## 2022-01-06 ENCOUNTER — Other Ambulatory Visit: Payer: Self-pay

## 2022-01-06 ENCOUNTER — Ambulatory Visit (INDEPENDENT_AMBULATORY_CARE_PROVIDER_SITE_OTHER): Payer: BC Managed Care – PPO | Admitting: Internal Medicine

## 2022-01-06 VITALS — BP 130/78 | HR 55 | Temp 97.9°F | Resp 16 | Ht 63.0 in | Wt 148.0 lb

## 2022-01-06 DIAGNOSIS — K7581 Nonalcoholic steatohepatitis (NASH): Secondary | ICD-10-CM | POA: Diagnosis not present

## 2022-01-06 DIAGNOSIS — I10 Essential (primary) hypertension: Secondary | ICD-10-CM

## 2022-01-06 MED ORDER — RYBELSUS 3 MG PO TABS: 3.0000 mg | ORAL_TABLET | Freq: Every day | ORAL | 0 refills | Status: DC

## 2022-01-06 MED ORDER — OLMESARTAN MEDOXOMIL 5 MG PO TABS: 10.0000 mg | ORAL_TABLET | Freq: Every day | ORAL | 1 refills | Status: DC

## 2022-01-06 NOTE — Patient Instructions (Signed)

## 2022-01-06 NOTE — Progress Notes (Signed)
? ?Subjective:  ?Patient ID: Jessica Mack, female    DOB: [birth year]  Age: 66 y.o. MRN: [id number] ? ?CC: Hypertension ? ?This visit occurred during the SARS-CoV-2 public health emergency.  Safety protocols were in place, including screening questions prior to the visit, additional usage of staff PPE, and extensive cleaning of exam room while observing appropriate contact time as indicated for disinfecting solutions.   ? ?HPI ?[name] presents for f/up -  ? ?About 5 weeks ago she was seen elsewhere and complained of dizziness and lightheadedness.  It was recommended she decrease her dose of olmesartan.  She walks about 3 miles a day and does not experience chest pain, shortness of breath, diaphoresis, edema, or fatigue. ? ?Outpatient Medications Prior to Visit  ?Medication Sig Dispense Refill  ? baclofen (LIORESAL) 10 MG tablet Take 0.5 tablets (5 mg total) by mouth at bedtime as needed for muscle spasms. 45 each 1  ? clonazePAM (KLONOPIN) 1 MG tablet TAKE 1 TABLET(1 MG) BY MOUTH AT BEDTIME AS NEEDED FOR ANXIETY 90 tablet 1  ? Estradiol 10 MCG TABS vaginal tablet 1 tablet vaginally twice weekly 24 tablet 1  ? famciclovir (FAMVIR) 500 MG tablet 3 tablets (1553m) at onset of fever blister symptoms 15 tablet 1  ? famotidine (PEPCID) 10 MG tablet Take 10 mg by mouth daily.    ? ketoconazole (NIZORAL) 2 % shampoo SMARTSIG:5 Milliliter(s) Topical 3 Times a Week    ? mometasone (ELOCON) 0.1 % ointment Apply topically twice weekly 45 g 1  ? Multiple Vitamins-Minerals (MULTIVITAMIN PO) Take 1 tablet by mouth daily. With calcium    ? nebivolol (BYSTOLIC) 5 MG tablet TAKE 1 TABLET(5 MG) BY MOUTH DAILY 90 tablet 1  ? omega-3 acid ethyl esters (LOVAZA) 1 g capsule TAKE TWO CAPSULES BY MOUTH DAILY (Patient taking differently: Take 1 g by mouth 2 (two) times daily.) 360 capsule 1  ? Probiotic Product (ALIGN PO) Take 1 tablet by mouth daily.    ? olmesartan (BENICAR) 20 MG tablet TAKE 1 TABLET(20 MG) BY MOUTH DAILY 90 tablet 0   ? Continuous Blood Gluc Receiver (DEXCOM G6 RECEIVER) DEVI Use to monitor blood sugar 1 each 0  ? Continuous Blood Gluc Sensor (DEXCOM G6 SENSOR) MISC Use to monitor blood sugar-replace every 10 days 9 each 3  ? Continuous Blood Gluc Transmit (DEXCOM G6 TRANSMITTER) MISC Use to monitor blood sugar-replace every 90 days 1 each 3  ? tirzepatide (MOUNJARO) 5 MG/0.5ML Pen Inject 5 mg into the skin once a week. 6 mL 0  ? tirzepatide (MOUNJARO) 7.5 MG/0.5ML Pen Inject 7.5 mg into the skin once a week. 6 mL 1  ? ?No facility-administered medications prior to visit.  ? ? ?ROS ?Review of Systems  ?Constitutional: Negative.  Negative for diaphoresis and fatigue.  ?HENT: Negative.    ?Eyes:  Negative for visual disturbance.  ?Respiratory:  Negative for cough, chest tightness and wheezing.   ?Cardiovascular:  Negative for chest pain, palpitations and leg swelling.  ?Gastrointestinal:  Negative for abdominal pain, constipation, diarrhea, nausea and vomiting.  ?Endocrine: Negative.   ?Genitourinary: Negative.  Negative for difficulty urinating.  ?Musculoskeletal: Negative.  Negative for arthralgias and myalgias.  ?Skin: Negative.   ?Neurological:  Negative for dizziness, weakness and light-headedness.  ?Hematological:  Negative for adenopathy. Does not bruise/bleed easily.  ?Psychiatric/Behavioral: Negative.    ? ?Objective:  ?BP 130/78 (BP Location: Left Arm, Patient Position: Sitting, Cuff Size: Large)   Pulse (!) 55   Temp  97.9 ?F (36.6 ?C) (Oral)   Resp 16   Ht 5' 3"  (1.6 m)   Wt 148 lb (67.1 kg)   LMP 06/26/2004   SpO2 97%   BMI 26.22 kg/m?  ? ?BP Readings from Last 3 Encounters:  ?01/06/22 130/78  ?12/03/21 95/62  ?10/01/21 (!) 83/58  ? ? ?Wt Readings from Last 3 Encounters:  ?01/06/22 148 lb (67.1 kg)  ?12/03/21 148 lb 12.8 oz (67.5 kg)  ?10/01/21 153 lb (69.4 kg)  ? ? ?Physical Exam ?Vitals reviewed.  ?HENT:  ?   Nose: Nose normal.  ?   Mouth/Throat:  ?   Mouth: Mucous membranes are moist.  ?Eyes:  ?   General:  No scleral icterus. ?Cardiovascular:  ?   Rate and Rhythm: Normal rate and regular rhythm.  ?   Heart sounds: No murmur heard. ?Pulmonary:  ?   Effort: Pulmonary effort is normal.  ?   Breath sounds: No stridor. No wheezing, rhonchi or rales.  ?Abdominal:  ?   General: Abdomen is flat.  ?   Palpations: There is no mass.  ?   Tenderness: There is no abdominal tenderness. There is no guarding.  ?   Hernia: No hernia is present.  ?Musculoskeletal:     ?   General: Normal range of motion.  ?   Cervical back: Neck supple.  ?   Right lower leg: No edema.  ?   Left lower leg: No edema.  ?Lymphadenopathy:  ?   Cervical: No cervical adenopathy.  ?Skin: ?   General: Skin is warm and dry.  ?Neurological:  ?   General: No focal deficit present.  ?   Mental Status: She is alert.  ?Psychiatric:     ?   Mood and Affect: Mood normal.     ?   Behavior: Behavior normal.  ? ? ?Lab Results  ?Component Value Date  ? WBC 4.0 10/15/2021  ? HGB 13.9 10/15/2021  ? HCT 42.3 10/15/2021  ? PLT 224 10/15/2021  ? GLUCOSE 95 10/15/2021  ? CHOL 226 (H) 10/15/2021  ? TRIG 194 (H) 10/15/2021  ? HDL 53 10/15/2021  ? LDLDIRECT 116.0 05/31/2020  ? LDLCALC 138 (H) 10/15/2021  ? ALT 39 (H) 10/15/2021  ? AST 33 10/15/2021  ? NA 137 10/15/2021  ? K 4.5 10/15/2021  ? CL 97 10/15/2021  ? CREATININE 0.83 10/15/2021  ? BUN 22 10/15/2021  ? CO2 21 10/15/2021  ? TSH 1.680 10/15/2021  ? HGBA1C 5.3 10/15/2021  ? ? ?MM 3D SCREEN BREAST BILATERAL ? ?Result Date: 09/30/2021 ?CLINICAL DATA:  Screening. EXAM: DIGITAL SCREENING BILATERAL MAMMOGRAM WITH TOMOSYNTHESIS AND CAD TECHNIQUE: Bilateral screening digital craniocaudal and mediolateral oblique mammograms were obtained. Bilateral screening digital breast tomosynthesis was performed. The images were evaluated with computer-aided detection. COMPARISON:  Previous exam(s). ACR Breast Density Category b: There are scattered areas of fibroglandular density. FINDINGS: There are no findings suspicious for malignancy.  IMPRESSION: No mammographic evidence of malignancy. A result letter of this screening mammogram will be mailed directly to the patient. RECOMMENDATION: Screening mammogram in one year. (Code:SM-B-01Y) BI-RADS CATEGORY  1: Negative. Electronically Signed   By: Abelardo Diesel M.D.   On: 09/30/2021 11:20  ? ? ?Assessment & Plan:  ? ?Jessieca was seen today for hypertension. ? ?Diagnoses and all orders for this visit: ? ?Essential hypertension- Her blood pressure is adequately well controlled.  Will decrease the olmesartan dose to 10 mg a day. ?-     olmesartan (BENICAR)  5 MG tablet; Take 2 tablets (10 mg total) by mouth daily. ? ?NASH (nonalcoholic steatohepatitis)- I recommended that she treat this with a GLP-1 agonist. ?-     Semaglutide (RYBELSUS) 3 MG TABS; Take 3 mg by mouth daily. ? ? ?I have discontinued Rudi Rummage [place]'s Dexcom G6 Transmitter, Dexcom G6 Sensor, Dexcom G6 Receiver, tirzepatide, tirzepatide, and olmesartan. I am also having her start on olmesartan and Rybelsus. Additionally, I am having her maintain her Multiple Vitamins-Minerals (MULTIVITAMIN PO), Probiotic Product (ALIGN PO), famciclovir, famotidine, ketoconazole, mometasone, Estradiol, omega-3 acid ethyl esters, nebivolol, baclofen, and clonazePAM. ? ?Meds ordered this encounter  ?Medications  ? olmesartan (BENICAR) 5 MG tablet  ?  Sig: Take 2 tablets (10 mg total) by mouth daily.  ?  Dispense:  180 tablet  ?  Refill:  1  ? Semaglutide (RYBELSUS) 3 MG TABS  ?  Sig: Take 3 mg by mouth daily.  ?  Dispense:  30 tablet  ?  Refill:  0  ? ? ? ?Follow-up: Return in about 6 months (around 07/09/2022). ? ?Scarlette Calico, MD ?

## 2022-01-12 ENCOUNTER — Telehealth: Payer: Self-pay

## 2022-01-12 NOTE — Telephone Encounter (Signed)
Key: [id number] ?

## 2022-01-13 ENCOUNTER — Encounter (INDEPENDENT_AMBULATORY_CARE_PROVIDER_SITE_OTHER): Payer: Self-pay | Admitting: Family Medicine

## 2022-01-13 ENCOUNTER — Ambulatory Visit (INDEPENDENT_AMBULATORY_CARE_PROVIDER_SITE_OTHER): Payer: BC Managed Care – PPO | Admitting: Family Medicine

## 2022-01-13 ENCOUNTER — Other Ambulatory Visit: Payer: Self-pay

## 2022-01-13 VITALS — BP 130/78 | HR 57 | Temp 97.9°F | Ht 63.0 in | Wt 147.0 lb

## 2022-01-13 DIAGNOSIS — K7581 Nonalcoholic steatohepatitis (NASH): Secondary | ICD-10-CM

## 2022-01-13 DIAGNOSIS — F4323 Adjustment disorder with mixed anxiety and depressed mood: Secondary | ICD-10-CM | POA: Diagnosis not present

## 2022-01-13 DIAGNOSIS — E88819 Insulin resistance, unspecified: Secondary | ICD-10-CM

## 2022-01-13 DIAGNOSIS — E782 Mixed hyperlipidemia: Secondary | ICD-10-CM

## 2022-01-13 DIAGNOSIS — E669 Obesity, unspecified: Secondary | ICD-10-CM

## 2022-01-13 DIAGNOSIS — Z6826 Body mass index (BMI) 26.0-26.9, adult: Secondary | ICD-10-CM

## 2022-01-13 DIAGNOSIS — E8881 Metabolic syndrome: Secondary | ICD-10-CM | POA: Diagnosis not present

## 2022-01-13 MED ORDER — WEGOVY 0.25 MG/0.5ML ~~LOC~~ SOAJ: 0.2500 mg | SUBCUTANEOUS | 0 refills | Status: DC

## 2022-01-14 NOTE — Telephone Encounter (Signed)
Per [employer] PA was denied ?

## 2022-01-21 ENCOUNTER — Encounter (INDEPENDENT_AMBULATORY_CARE_PROVIDER_SITE_OTHER): Payer: Self-pay

## 2022-01-21 ENCOUNTER — Telehealth (INDEPENDENT_AMBULATORY_CARE_PROVIDER_SITE_OTHER): Payer: Self-pay | Admitting: Family Medicine

## 2022-01-21 NOTE — Telephone Encounter (Signed)
Prior authorization denied for [facility]. Per insurance not a covered benefit/plan exclusion. Patient notified of denial message via mychart.  ?

## 2022-01-22 NOTE — Progress Notes (Signed)
Chief Complaint:   OBESITY Jessica Mack is here to discuss her progress with her obesity treatment plan along with follow-up of her obesity related diagnoses. See Medical Weight Management Flowsheet for complete bioelectrical impedance results.  Today's visit was #: 6 Starting weight: 162 lbs Starting date: 07/15/2021 Weight change since last visit: +14 lbs Total lbs lost to date: 0  Nutrition Plan: Keeping a food journal and adhering to recommended goals of 1000 calories and 85 grams of protein daily for 50% of the time. Activity: Walking/pilates/swimming for 60 minuets 5 days a week. Anti-obesity medications: Rybelsus 5 mg daily. Reported side effects: None.  Assessment/Plan:   1. Insulin resistance, mild, with polyphagia Not at goal. Goal is HgbA1c < 5.7, fasting insulin closer to 5.  Medication: None.    Plan:  She will continue to focus on protein-rich, low simple carbohydrate foods. We reviewed the importance of hydration, regular exercise for stress reduction, and restorative sleep.   Lab Results  Component Value Date   HGBA1C 5.3 10/15/2021   Lab Results  Component Value Date   INSULIN 22.4 10/15/2021   INSULIN 10.2 07/15/2021   2. NASH (nonalcoholic steatohepatitis) Intensive lifestyle modifications are the first line treatment for this issue. We discussed several lifestyle modifications today and she will continue to work on diet, exercise and weight loss efforts.   Counseling: NAFLD is an umbrella term that encompasses a disease spectrum that includes steatosis (fat) without inflammation, steatohepatitis (NASH; fat + inflammation in a characteristic pattern), and cirrhosis. Bland steatosis is felt to be a benign condition, with extremely low to no risk of progression to cirrhosis, whereas NASH can progress to cirrhosis. The mainstay of treatment of NAFLD includes lifestyle modification to achieve weight loss, at least 7% of current body weight. Low carbohydrate diets  can be beneficial in improving NAFLD liver histology. Additionally, exercise, even the absence of weight loss can have beneficial effects on the patient's metabolic profile and liver health. We recommend that their metabolic comorbidities be aggressively managed, as patients with NAFLD are at increased risk of coronary artery disease.  3. Mixed hyperlipidemia Course: Not at goal. Lipid-lowering medications: None.   Plan: Dietary changes: Increase soluble fiber, decrease simple carbohydrates, decrease saturated fat. Exercise changes: Moderate to vigorous-intensity aerobic activity 150 minutes per week or as tolerated. We will continue to monitor along with PCP/specialists as it pertains to her weight loss journey.  Lab Results  Component Value Date   CHOL 226 (H) 10/15/2021   HDL 53 10/15/2021   LDLCALC 138 (H) 10/15/2021   LDLDIRECT 116.0 05/31/2020   TRIG 194 (H) 10/15/2021   CHOLHDL 5.2 (H) 07/15/2021   Lab Results  Component Value Date   ALT 39 (H) 10/15/2021   AST 33 10/15/2021   ALKPHOS 72 10/15/2021   BILITOT 0.5 10/15/2021   The 10-year ASCVD risk score (Arnett DK, et al., 2019) is: 13%   Values used to calculate the score:     Age: 66 years     Sex: Female     Is Non-Hispanic African American: No     Diabetic: No     Tobacco smoker: Yes     Systolic Blood Pressure: 825 mmHg     Is BP treated: Yes     HDL Cholesterol: 53 mg/dL     Total Cholesterol: 226 mg/dL  4. Obesity, current BMI 26.1  - Start Semaglutide-Weight Management (WEGOVY) 0.25 MG/0.5ML SOAJ; Inject 0.25 mg into the skin once a week.  Dispense: 2 mL; Refill: 0  Course: Jessica Mack is currently in the action stage of change. As such, her goal is to continue with weight loss efforts.   Nutrition goals: She has agreed to keeping a food journal and adhering to recommended goals of 1000 calories and 85 grams of protein.   Exercise goals:  As is.  Behavioral modification strategies: increasing lean protein intake,  decreasing simple carbohydrates, increasing vegetables, and increasing water intake.  Jessica Mack has agreed to follow-up with our clinic in 5 weeks. She was informed of the importance of frequent follow-up visits to maximize her success with intensive lifestyle modifications for her multiple health conditions.   delete paragraph if no labs orderedLynn was informed we would discuss her lab results at her next visit unless there is a critical issue that needs to be addressed sooner. Jessica Mack agreed to keep her next visit at the agreed upon time to discuss these results.  Objective:   Blood pressure 130/78, pulse (!) 57, temperature 97.9 F (36.6 C), temperature source Oral, height 5' 3"  (1.6 m), weight 147 lb (66.7 kg), last menstrual period 06/26/2004, SpO2 98 %. Body mass index is 26.04 kg/m.  General: Cooperative, alert, well developed, in no acute distress. HEENT: Conjunctivae and lids unremarkable. Cardiovascular: Regular rhythm.  Lungs: Normal work of breathing. Neurologic: No focal deficits.   Lab Results  Component Value Date   CREATININE 0.83 10/15/2021   BUN 22 10/15/2021   NA 137 10/15/2021   K 4.5 10/15/2021   CL 97 10/15/2021   CO2 21 10/15/2021   Lab Results  Component Value Date   ALT 39 (H) 10/15/2021   AST 33 10/15/2021   ALKPHOS 72 10/15/2021   BILITOT 0.5 10/15/2021   Lab Results  Component Value Date   HGBA1C 5.3 10/15/2021   HGBA1C 5.4 07/15/2021   HGBA1C 5.1 07/17/2011   Lab Results  Component Value Date   INSULIN 22.4 10/15/2021   INSULIN 10.2 07/15/2021   Lab Results  Component Value Date   TSH 1.680 10/15/2021   Lab Results  Component Value Date   CHOL 226 (H) 10/15/2021   HDL 53 10/15/2021   LDLCALC 138 (H) 10/15/2021   LDLDIRECT 116.0 05/31/2020   TRIG 194 (H) 10/15/2021   CHOLHDL 5.2 (H) 07/15/2021   Lab Results  Component Value Date   VD25OH 47.1 10/15/2021   VD25OH 37.2 07/15/2021   VD25OH 77 07/17/2011   Lab Results  Component Value  Date   WBC 4.0 10/15/2021   HGB 13.9 10/15/2021   HCT 42.3 10/15/2021   MCV 94 10/15/2021   PLT 224 10/15/2021   Lab Results  Component Value Date   IRON 99 10/15/2021   TIBC 325 10/15/2021   FERRITIN 645 (H) 10/15/2021   Attestation Statements:   Reviewed by clinician on day of visit: allergies, medications, problem list, medical history, surgical history, family history, social history, and previous encounter notes.  I, Water quality scientist, CMA, am acting as transcriptionist for Briscoe Deutscher, DO  I have reviewed the above documentation for accuracy and completeness, and I agree with the above. -  Briscoe Deutscher, DO, MS, FAAFP, DABOM - Family and Bariatric Medicine.

## 2022-02-08 ENCOUNTER — Other Ambulatory Visit: Payer: Self-pay | Admitting: Internal Medicine

## 2022-02-08 DIAGNOSIS — E781 Pure hyperglyceridemia: Secondary | ICD-10-CM

## 2022-02-10 ENCOUNTER — Encounter: Payer: Self-pay | Admitting: Internal Medicine

## 2022-02-10 DIAGNOSIS — F4323 Adjustment disorder with mixed anxiety and depressed mood: Secondary | ICD-10-CM | POA: Diagnosis not present

## 2022-02-10 LAB — BASIC METABOLIC PANEL
BUN: 19 (ref 4–21)
CO2: 22 (ref 13–22)
Chloride: 103 (ref 99–108)
Creatinine: 0.7 (ref 0.5–1.1)
Glucose: 90
Potassium: 4 mEq/L (ref 3.5–5.1)
Sodium: 140 (ref 137–147)

## 2022-02-10 LAB — CBC AND DIFFERENTIAL
HCT: 41 (ref 36–46)
Hemoglobin: 13.9 (ref 12.0–16.0)
Platelets: 231 10*3/uL (ref 150–400)
WBC: 4.3

## 2022-02-10 LAB — HEPATIC FUNCTION PANEL
ALT: 28 U/L (ref 7–35)
AST: 23 (ref 13–35)
Alkaline Phosphatase: 75 (ref 25–125)
Bilirubin, Total: 0.3

## 2022-02-10 LAB — LIPID PANEL
Cholesterol: 252 — AB (ref 0–200)
HDL: 48 (ref 35–70)
LDL Cholesterol: 133
Triglycerides: 365 — AB (ref 40–160)

## 2022-02-10 LAB — COMPREHENSIVE METABOLIC PANEL
Albumin: 4.8 (ref 3.5–5.0)
Calcium: 9.7 (ref 8.7–10.7)

## 2022-02-10 LAB — HEMOGLOBIN A1C: Hemoglobin A1C: 5.3

## 2022-02-10 LAB — TSH: TSH: 2.14 (ref 0.41–5.90)

## 2022-02-16 DIAGNOSIS — H01111 Allergic dermatitis of right upper eyelid: Secondary | ICD-10-CM | POA: Diagnosis not present

## 2022-02-16 DIAGNOSIS — H43813 Vitreous degeneration, bilateral: Secondary | ICD-10-CM | POA: Diagnosis not present

## 2022-02-16 DIAGNOSIS — H40013 Open angle with borderline findings, low risk, bilateral: Secondary | ICD-10-CM | POA: Diagnosis not present

## 2022-02-16 DIAGNOSIS — H01114 Allergic dermatitis of left upper eyelid: Secondary | ICD-10-CM | POA: Diagnosis not present

## 2022-02-24 DIAGNOSIS — F4323 Adjustment disorder with mixed anxiety and depressed mood: Secondary | ICD-10-CM | POA: Diagnosis not present

## 2022-03-11 ENCOUNTER — Encounter: Payer: Self-pay | Admitting: Internal Medicine

## 2022-03-13 ENCOUNTER — Encounter (HOSPITAL_COMMUNITY): Payer: Self-pay

## 2022-03-13 ENCOUNTER — Ambulatory Visit (INDEPENDENT_AMBULATORY_CARE_PROVIDER_SITE_OTHER): Payer: BC Managed Care – PPO

## 2022-03-13 ENCOUNTER — Ambulatory Visit (HOSPITAL_COMMUNITY)
Admission: RE | Admit: 2022-03-13 | Discharge: 2022-03-13 | Disposition: A | Discharge status: Home / Self Care | Payer: BC Managed Care – PPO | Source: Ambulatory Visit | Attending: Family Medicine | Admitting: Family Medicine

## 2022-03-13 VITALS — BP 125/76 | HR 64 | Temp 98.6°F | Resp 18

## 2022-03-13 DIAGNOSIS — Z20822 Contact with and (suspected) exposure to covid-19: Secondary | ICD-10-CM | POA: Diagnosis not present

## 2022-03-13 DIAGNOSIS — R0789 Other chest pain: Secondary | ICD-10-CM | POA: Insufficient documentation

## 2022-03-13 DIAGNOSIS — R051 Acute cough: Secondary | ICD-10-CM

## 2022-03-13 DIAGNOSIS — R059 Cough, unspecified: Secondary | ICD-10-CM

## 2022-03-13 DIAGNOSIS — J029 Acute pharyngitis, unspecified: Secondary | ICD-10-CM

## 2022-03-13 LAB — POCT RAPID STREP A, ED / UC: Streptococcus, Group A Screen (Direct): NEGATIVE

## 2022-03-13 MED ORDER — ALBUTEROL SULFATE HFA 108 (90 BASE) MCG/ACT IN AERS: 2.0000 | INHALATION_SPRAY | RESPIRATORY_TRACT | 0 refills | Status: DC | PRN

## 2022-03-13 MED ORDER — BENZONATATE 200 MG PO CAPS: 200.0000 mg | ORAL_CAPSULE | Freq: Three times a day (TID) | ORAL | 0 refills | Status: DC

## 2022-03-13 NOTE — ED Triage Notes (Signed)
5-6 day h/o sore throat and 2 days of cough. Afebrile . Negative at home covid test. Has been taking aleve, cough drops and claritin.

## 2022-03-13 NOTE — ED Provider Notes (Signed)
[place]    CSN: [id number] Arrival date & time: 03/13/22  1349      History   Chief Complaint Chief Complaint  Patient presents with   2p appt    HPI Jessica Mack is a 66 y.o. female.   Patient is here for uri symptoms x 6 days. She started with sore throat, but it has been very very sore.  No fever.  This morning she woke up and could not talk.  Started with cough last night, woke with coughing fits last night, + sputum this morning.  Feeling tightness in her chest with breathing.  Ears hurting.   Covid negative at home.   Past Medical History:  Diagnosis Date   Avascular necrosis of bone ([place])    Left talus   Blood transfusion    Breast cancer ([place]) 2005   left   Cancer ([place])    Dysplastic nevus 1988   right hip   Fatty liver    GERD (gastroesophageal reflux disease)    History of breast cancer 2005   invasive ductal cancer left breast   Hypertension    Hypertriglyceridemia    Irritable bowel syndrome    Metabolic syndrome    resolved with diet and exercise   NASH (nonalcoholic steatohepatitis)    Nocturnal leg cramps 02/14/2015   Osteopenia    Palpitations 09/2012   normal echo   Peripheral neuropathy    Personal history of chemotherapy    Personal history of radiation therapy    Plantar fasciitis 03/2003   Tongue dysplasia    Ulcer     Patient Active Problem List   Diagnosis Date Noted   NASH (nonalcoholic steatohepatitis) 06/04/2020   Hypertriglyceridemia    Routine general medical examination at a health care facility 02/20/2020   Essential hypertension 02/20/2020   Nocturnal leg cramps 02/14/2015   Osteopenia 08/05/2014   Breast cancer of upper-outer quadrant of left female breast ([place]) 07/20/2013   Breast cancer screening, high risk patient 05/30/2013   Leukoplakia of oral mucosa, including tongue 07/26/2012   Essential and other specified forms of tremor 07/26/2012   Drug-induced polyneuropathy ([place]) 07/26/2012    Past  Surgical History:  Procedure Laterality Date   ANKLE SURGERY  07/2007   left, revascularization   BREAST LUMPECTOMY  06/2004   left, with port placement   [place]   x2   COLONOSCOPY  09/2013   Dr. Collene Mares   DG GALL BLADDER     LAPAROSCOPIC CHOLECYSTECTOMY  2003   [place]  11/15   implant/crown placed 4/16   SHOULDER ARTHROSCOPY  10/2005   right   SHOULDER SURGERY  12/15   left   TONGUE SURGERY  2012, 2013   WRIST SURGERY  1988   right, [place]ervains    OB History     Gravida  2   Para  2   Term      Preterm      AB      Living  2      SAB      IAB      Ectopic      Multiple      Live Births               Home Medications    Prior to Admission medications   Medication Sig Start Date End Date Taking? Authorizing Provider  baclofen (LIORESAL) 10 MG tablet Take 0.5 tablets (5 mg total) by mouth  at bedtime as needed for muscle spasms. 11/19/21   Melvenia Beam, MD  clonazePAM (KLONOPIN) 1 MG tablet TAKE 1 TABLET(1 MG) BY MOUTH AT BEDTIME AS NEEDED FOR ANXIETY 11/23/21   Janith Lima, MD  Estradiol 10 MCG TABS vaginal tablet 1 tablet vaginally twice weekly 08/21/21   Megan Salon, MD  famciclovir [facility]) 500 MG tablet 3 tablets (1573m) at onset of fever blister symptoms 10/28/18   MMegan Salon MD  famotidine (PEPCID) 10 MG tablet Take 10 mg by mouth daily.    [provider]  ketoconazole (NIZORAL) 2 % shampoo SMARTSIG:5 Milliliter(s) Topical 3 Times a Week 07/02/20   [provider]  mometasone (ELOCON) 0.1 % ointment Apply topically twice weekly 06/16/21   MMegan Salon MD  Multiple Vitamins-Minerals (MULTIVITAMIN PO) Take 1 tablet by mouth daily. With calcium    [provider]  nebivolol (BYSTOLIC) 5 MG tablet TAKE 1 TABLET(5 MG) BY MOUTH DAILY 09/28/21   JJanith Lima MD  olmesartan (BENICAR) 5 MG tablet Take 2 tablets (10 mg total) by mouth daily. 01/06/22   JJanith Lima MD  omega-3 acid  ethyl esters (LOVAZA) 1 g capsule TAKE TWO CAPSULES BY MOUTH DAILY 02/08/22   JJanith Lima MD  Probiotic Product (ALIGN PO) Take 1 tablet by mouth daily.    [provider]  Semaglutide-Weight Management (WEGOVY) 0.25 MG/0.5ML SOAJ Inject 0.25 mg into the skin once a week. 01/13/22   WBriscoe Deutscher DO    Family History Family History  Problem Relation Age of Onset   Thyroid disease Mother    Hyperlipidemia Mother    Hypertension Mother    Diabetes Mother    Cancer Mother        melanoma   Heart disease Mother    Depression Mother    Sleep apnea Mother    Obesity Mother    Heart disease Father    Hyperlipidemia Father    Hypertension Father    Cancer Father        colon   Prostate cancer Father    Kidney disease Father    Asthma Sister    Asthma Brother    Breast cancer Neg Hx     Social History Social History   Tobacco Use   Smoking status: Never   Smokeless tobacco: Never  Vaping Use   Vaping Use: Never used  Substance Use Topics   Alcohol use: Yes    Alcohol/week: 7.0 standard drinks    Types: 7 Standard drinks or equivalent per week   Drug use: No     Allergies   Augmentin [amoxicillin-pot clavulanate], Cephalexin, and Crestor [rosuvastatin]   Review of Systems Review of Systems  Constitutional:  Negative for chills and fever.  HENT:  Positive for congestion and sore throat.   Respiratory:  Positive for cough and choking.   Cardiovascular: Negative.   Gastrointestinal: Negative.   Genitourinary: Negative.   Musculoskeletal: Negative.     Physical Exam Triage Vital Signs ED Triage Vitals [03/13/22 1403]  Enc Vitals Group     BP 125/76     Pulse Rate 64     Resp 18     Temp 98.6 F (37 C)     Temp Source Oral     SpO2 98 %     Weight      Height      Head Circumference      Peak Flow      Pain Score  5     Pain Loc      Pain Edu?      Excl. in [place]?    No data found.  Updated Vital Signs BP 125/76 (BP Location: Left Arm)    Pulse 64   Temp 98.6 F (37 C) (Oral)   Resp 18   LMP 06/26/2004   SpO2 98%   Visual Acuity Right Eye Distance:   Left Eye Distance:   Bilateral Distance:    Right Eye Near:   Left Eye Near:    Bilateral Near:     Physical Exam Constitutional:      Appearance: Normal appearance.  HENT:     Head: Normocephalic.     Right Ear: Tympanic membrane normal.     Left Ear: Tympanic membrane normal.     Nose: Congestion and rhinorrhea present.     Mouth/Throat:     Mouth: Mucous membranes are moist.     Pharynx: Posterior oropharyngeal erythema present. No oropharyngeal exudate.  Cardiovascular:     Rate and Rhythm: Normal rate and regular rhythm.  Pulmonary:     Effort: Pulmonary effort is normal.     Breath sounds: Rhonchi present.  Musculoskeletal:     Cervical back: Normal range of motion and neck supple. Tenderness present.  Skin:    General: Skin is warm.  Neurological:     General: No focal deficit present.     Mental Status: She is alert.  Psychiatric:        Mood and Affect: Mood normal.     UC Treatments / Results  Labs (all labs ordered are listed, but only abnormal results are displayed) Labs Reviewed  SARS CORONAVIRUS 2 (TAT 6-24 HRS)  CULTURE, GROUP A STREP [facility])  POCT RAPID STREP A, ED / UC    EKG   Radiology DG Chest 2 View  Result Date: 03/13/2022 CLINICAL DATA:  Cough. EXAM: CHEST - 2 VIEW COMPARISON:  July 01, 2004. FINDINGS: The heart size and mediastinal contours are within normal limits. Both lungs are clear. The visualized skeletal structures are unremarkable. IMPRESSION: No active cardiopulmonary disease. Electronically Signed   By: Marijo Conception M.D.   On: 03/13/2022 14:33    Procedures Procedures (including critical care time)  Medications Ordered in UC Medications - No data to display  Initial Impression / Assessment and Plan / UC Course  I have reviewed the triage vital signs and the nursing notes.  Pertinent labs &  imaging results that were available during my care of the patient were reviewed by me and considered in my medical decision making (see chart for details).    Final Clinical Impressions(s) / UC Diagnoses   Final diagnoses:  Sore throat  Acute cough     Discharge Instructions      You were seen today for upper respiratory symptoms.  Strep test was negative, this  will be sent for culture.  The xray was normal.  This is likely viral.  We have done a covid swab which will be resulted tomorrow.  I have sent ou an inhaler and tessalon perles to help.  Please get plenty of rest and fluids.  Please return if not improving as expected.       ED Prescriptions     Medication Sig Dispense Auth. Provider   benzonatate (TESSALON) 200 MG capsule Take 1 capsule (200 mg total) by mouth every 8 (eight) hours. 21 capsule Samyria Rudie, MD   albuterol (VENTOLIN HFA) 108 (90 Base)  MCG/ACT inhaler Inhale 2 puffs into the lungs every 4 (four) hours as needed for wheezing or shortness of breath. 1 each Rondel Oh, MD      PDMP not reviewed this encounter.   Rondel Oh, MD 03/13/22 1447

## 2022-03-13 NOTE — Discharge Instructions (Addendum)
You were seen today for upper respiratory symptoms.  Strep test was negative, this  will be sent for culture.  The xray was normal.  This is likely viral.  We have done a covid swab which will be resulted tomorrow.  I have sent ou an inhaler and tessalon perles to help.  Please get plenty of rest and fluids.  Please return if not improving as expected.

## 2022-03-14 LAB — SARS CORONAVIRUS 2 (TAT 6-24 HRS): SARS Coronavirus 2: NEGATIVE

## 2022-03-16 LAB — CULTURE, GROUP A STREP (THRC)

## 2022-03-17 ENCOUNTER — Ambulatory Visit (INDEPENDENT_AMBULATORY_CARE_PROVIDER_SITE_OTHER): Payer: BC Managed Care – PPO | Admitting: Internal Medicine

## 2022-03-17 ENCOUNTER — Encounter: Payer: Self-pay | Admitting: Internal Medicine

## 2022-03-17 VITALS — BP 126/76 | HR 70 | Temp 98.3°F | Resp 16 | Ht 63.0 in | Wt 151.0 lb

## 2022-03-17 DIAGNOSIS — E781 Pure hyperglyceridemia: Secondary | ICD-10-CM | POA: Diagnosis not present

## 2022-03-17 DIAGNOSIS — Z1239 Encounter for other screening for malignant neoplasm of breast: Secondary | ICD-10-CM

## 2022-03-17 DIAGNOSIS — I10 Essential (primary) hypertension: Secondary | ICD-10-CM | POA: Diagnosis not present

## 2022-03-17 MED ORDER — FENOFIBRATE 67 MG PO CAPS: 67.0000 mg | ORAL_CAPSULE | Freq: Every day | ORAL | 1 refills | Status: DC

## 2022-03-17 MED ORDER — OMEGA-3-ACID ETHYL ESTERS 1 G PO CAPS: 2.0000 g | ORAL_CAPSULE | Freq: Two times a day (BID) | ORAL | 1 refills | Status: DC

## 2022-03-17 NOTE — Progress Notes (Unsigned)
Subjective:  Patient ID: Jessica Mack, female    DOB: April 17, 1956  Age: 66 y.o. MRN: [id number]  CC: Hyperlipidemia and Hypertension   HPI Jessica Mack presents for f/up -  She is recovering from a recent URI. She had labs done elsewhere and is concerned that her triglycerides were high.  Outpatient Medications Prior to Visit  Medication Sig Dispense Refill   albuterol (VENTOLIN HFA) 108 (90 Base) MCG/ACT inhaler Inhale 2 puffs into the lungs every 4 (four) hours as needed for wheezing or shortness of breath. 1 each 0   baclofen (LIORESAL) 10 MG tablet Take 0.5 tablets (5 mg total) by mouth at bedtime as needed for muscle spasms. 45 each 1   benzonatate (TESSALON) 200 MG capsule Take 1 capsule (200 mg total) by mouth every 8 (eight) hours. 21 capsule 0   clonazePAM (KLONOPIN) 1 MG tablet TAKE 1 TABLET(1 MG) BY MOUTH AT BEDTIME AS NEEDED FOR ANXIETY 90 tablet 1   Estradiol 10 MCG TABS vaginal tablet 1 tablet vaginally twice weekly 24 tablet 1   famciclovir (FAMVIR) 500 MG tablet 3 tablets (1519m) at onset of fever blister symptoms 15 tablet 1   famotidine (PEPCID) 10 MG tablet Take 10 mg by mouth daily.     ketoconazole (NIZORAL) 2 % shampoo SMARTSIG:5 Milliliter(s) Topical 3 Times a Week     mometasone (ELOCON) 0.1 % ointment Apply topically twice weekly 45 g 1   Multiple Vitamins-Minerals (MULTIVITAMIN PO) Take 1 tablet by mouth daily. With calcium     nebivolol (BYSTOLIC) 5 MG tablet TAKE 1 TABLET(5 MG) BY MOUTH DAILY 90 tablet 1   olmesartan (BENICAR) 5 MG tablet Take 2 tablets (10 mg total) by mouth daily. 180 tablet 1   Probiotic Product (ALIGN PO) Take 1 tablet by mouth daily.     omega-3 acid ethyl esters (LOVAZA) 1 g capsule TAKE TWO CAPSULES BY MOUTH DAILY 360 capsule 1   Semaglutide-Weight Management (WEGOVY) 0.25 MG/0.5ML SOAJ Inject 0.25 mg into the skin once a week. 2 mL 0   No facility-administered medications prior to visit.    ROS Review of Systems  Constitutional:   Negative for diaphoresis and fatigue.  HENT: Negative.  Negative for sore throat.   Eyes: Negative.   Respiratory:  Negative for cough, chest tightness, shortness of breath and wheezing.   Cardiovascular:  Negative for chest pain, palpitations and leg swelling.  Gastrointestinal:  Negative for abdominal pain, constipation, diarrhea, nausea and vomiting.  Endocrine: Negative.   Genitourinary: Negative.  Negative for difficulty urinating.  Musculoskeletal:  Negative for arthralgias and myalgias.  Skin: Negative.   Neurological: Negative.  Negative for dizziness, weakness, light-headedness and headaches.  Hematological:  Negative for adenopathy. Does not bruise/bleed easily.  Psychiatric/Behavioral: Negative.     Objective:  BP 126/76 (BP Location: Right Arm, Patient Position: Sitting, Cuff Size: Large)   Pulse 70   Temp 98.3 F (36.8 C) (Oral)   Resp 16   Ht 5' 3"  (1.6 m)   Wt 151 lb (68.5 kg)   LMP 06/26/2004   SpO2 99%   BMI 26.75 kg/m   BP Readings from Last 3 Encounters:  03/17/22 126/76  03/13/22 125/76  01/13/22 130/78    Wt Readings from Last 3 Encounters:  03/17/22 151 lb (68.5 kg)  01/13/22 147 lb (66.7 kg)  01/06/22 148 lb (67.1 kg)    Physical Exam Vitals reviewed.  Constitutional:      Appearance: Normal appearance.  HENT:  Mouth/Throat:     Mouth: Mucous membranes are moist.  Eyes:     General: No scleral icterus.    Conjunctiva/sclera: Conjunctivae normal.  Cardiovascular:     Rate and Rhythm: Normal rate and regular rhythm.     Heart sounds: No murmur heard. Pulmonary:     Effort: Pulmonary effort is normal.     Breath sounds: No stridor. No wheezing, rhonchi or rales.  Abdominal:     General: Abdomen is flat.     Palpations: There is no mass.     Tenderness: There is no abdominal tenderness. There is no guarding.     Hernia: No hernia is present.  Musculoskeletal:        General: Normal range of motion.     Cervical back: Neck supple.      Right lower leg: No edema.     Left lower leg: No edema.  Lymphadenopathy:     Cervical: No cervical adenopathy.  Skin:    General: Skin is warm and dry.     Findings: No rash.  Neurological:     General: No focal deficit present.     Mental Status: She is alert.  Psychiatric:        Mood and Affect: Mood normal.        Behavior: Behavior normal.    Lab Results  Component Value Date   WBC 4.3 02/10/2022   HGB 13.9 02/10/2022   HCT 41 02/10/2022   PLT 231 02/10/2022   GLUCOSE 95 10/15/2021   CHOL 252 (A) 02/10/2022   TRIG 365 (A) 02/10/2022   HDL 48 02/10/2022   LDLDIRECT 116.0 05/31/2020   LDLCALC 133 02/10/2022   ALT 28 02/10/2022   AST 23 02/10/2022   NA 140 02/10/2022   K 4.0 02/10/2022   CL 103 02/10/2022   CREATININE 0.7 02/10/2022   BUN 19 02/10/2022   CO2 22 02/10/2022   TSH 2.14 02/10/2022   HGBA1C 5.3 02/10/2022    DG Chest 2 View  Result Date: 03/13/2022 CLINICAL DATA:  Cough. EXAM: CHEST - 2 VIEW COMPARISON:  July 01, 2004. FINDINGS: The heart size and mediastinal contours are within normal limits. Both lungs are clear. The visualized skeletal structures are unremarkable. IMPRESSION: No active cardiopulmonary disease. Electronically Signed   By: Marijo Conception M.D.   On: 03/13/2022 14:33    Assessment & Plan:   Jessica Mack was seen today for hyperlipidemia and hypertension.  Diagnoses and all orders for this visit:  Hypertriglyceridemia - Will add fenofibrate. -     fenofibrate micronized (LOFIBRA) 67 MG capsule; Take 1 capsule (67 mg total) by mouth daily before breakfast. -     omega-3 acid ethyl esters (LOVAZA) 1 g capsule; Take 2 capsules (2 g total) by mouth 2 (two) times daily.  Essential hypertension- Her BP is well controlled.   I have discontinued Jessica Mack's HQPRFF. I have also changed her omega-3 acid ethyl esters. Additionally, I am having her start on fenofibrate micronized. Lastly, I am having her maintain her Multiple  Vitamins-Minerals (MULTIVITAMIN PO), Probiotic Product (ALIGN PO), famciclovir, famotidine, ketoconazole, mometasone, Estradiol, nebivolol, baclofen, clonazePAM, olmesartan, benzonatate, and albuterol.  Meds ordered this encounter  Medications   fenofibrate micronized (LOFIBRA) 67 MG capsule    Sig: Take 1 capsule (67 mg total) by mouth daily before breakfast.    Dispense:  90 capsule    Refill:  1   omega-3 acid ethyl esters (LOVAZA) 1 g capsule  Sig: Take 2 capsules (2 g total) by mouth 2 (two) times daily.    Dispense:  360 capsule    Refill:  1     Follow-up: Return in about 6 months (around 09/17/2022).  Scarlette Calico, MD

## 2022-03-17 NOTE — Patient Instructions (Signed)
High Triglycerides Eating Plan ?Triglycerides are a type of fat in the blood. High levels of triglycerides can increase your risk of heart disease and stroke. If your triglyceride levels are high, choosing the right foods can help lower your triglycerides and keep your heart healthy. Work with your health care provider or a dietitian to develop an eating plan that is right for you. ?What are tips for following this plan? ?General guidelines ? ?Lose weight, if you are overweight. For most people, losing 5-10 lb (2-5 kg) helps lower triglyceride levels. A weight-loss plan may include: ?30 minutes of exercise at least 5 days a week. ?Reducing the amount of calories, sugar, and fat you eat. ?Eat a wide variety of fresh fruits, vegetables, and whole grains. These foods are high in fiber. ?Eat foods that contain healthy fats, such as fatty fish, nuts, seeds, and olive oil. ?Avoid foods that are high in added sugar, added salt (sodium), and saturated fat. ?Avoid low-fiber, refined carbohydrates such as white bread, crackers, noodles, and white rice. ?Avoid foods with trans fats or partially hydrogenated oils, such as fried foods or stick margarine. ?If you drink alcohol: ?Limit how much you have to: ?0-1 drink a day for women who are not pregnant. ?0-2 drinks a day for men. ?Your health care provider may recommend that you drink less than these amounts depending on your overall health. ?Know how much alcohol is in a drink. In the [place] one drink equals one 12 oz bottle of beer (355 mL), one 5 oz glass of wine (148 mL), or one 1? oz glass of hard liquor (44 mL). ?Reading food labels ?Check food labels for: ?The amount of saturated fat. Choose foods with no or very little saturated fat (less than 2 g). ?The amount of trans fat. Choose foods with no transfat. ?The amount of cholesterol. Choose foods that are low in cholesterol. ?The amount of sodium. Choose foods with less than 140 milligrams (mg) per serving. ?Shopping ?Buy  dairy products labeled as nonfat (skim) or low-fat (1%). ?Avoid buying processed or prepackaged foods. These are often high in added sugar, sodium, and fat. ?Cooking ?Choose healthy fats when cooking, such as olive oil, avocado oil, or canola oil. ?Cook foods using lower fat methods, such as baking, broiling, boiling, or grilling. ?Make your own sauces, dressings, and marinades when possible, instead of buying them. Store-bought sauces, dressings, and marinades are often high in sodium and sugar. ?Meal planning ?Eat more home-cooked food and less restaurant, buffet, and fast food. ?Eat fatty fish at least 2 times each week. Examples of fatty fish include salmon, trout, sardines, mackerel, tuna, and herring. ?If you eat whole eggs, do not eat more than 4 egg yolks per week. ?What foods should I eat? ?Fruits ?All fresh, canned (in natural juice), or frozen fruits. ?Vegetables ?Fresh or frozen vegetables. Low-sodium canned vegetables. ?Grains ?Whole wheat or whole grain breads, crackers, cereals, and pasta. Unsweetened oatmeal. Bulgur. Barley. Quinoa. Brown rice. Whole wheat flour tortillas. ?Meats and other proteins ?Skinless chicken or turkey. Ground chicken or turkey. Lean cuts of pork, trimmed of fat. Fish and seafood, especially salmon, trout, and herring. Egg whites. Dried beans, peas, or lentils. Unsalted nuts or seeds. Unsalted canned beans. Natural peanut or almond butter or other nut butters. ?Dairy ?Low-fat dairy products. Skim or low-fat (1%) milk. Reduced fat (2%) and low-sodium cheese. Low-fat ricotta cheese. Low-fat cottage cheese. Plain, low-fat yogurt. ?Fats and oils ?Tub margarine without trans fats. Light or reduced-fat mayonnaise. Light or   reduced-fat salad dressings. Avocado. Safflower, olive, sunflower, soybean, and canola oils. ?The items listed above may not be a complete list of recommended foods and beverages. Talk with your dietitian about what dietary choices are best for you. ?What foods  should I avoid? ?Fruits ?Sweetened dried fruit. Canned fruit in syrup. Fruit juice. ?Vegetables ?Creamed or fried vegetables. Vegetables in a cheese sauce. ?Grains ?White bread. White (regular) pasta. White rice. Cornbread. Bagels. Pastries. Crackers that contain trans fat. ?Meats and other proteins ?Fatty cuts of meat. Ribs. Chicken wings. Bacon. Sausage. Bologna. Salami. Chitterlings. Fatback. Hot dogs. Bratwurst. Packaged lunch meats. ?Dairy ?Whole or reduced-fat (2%) milk. Half-and-half. Cream cheese. Full-fat or sweetened yogurt. Full-fat cheese. Nondairy creamers. Whipped toppings. Processed cheese or cheese spreads. Cheese curds. ?Fats and oils ?Butter. Stick margarine. Lard. Shortening. Ghee. Bacon fat. Tropical oils, such as coconut, palm kernel, or palm oils. ?Beverages ?Alcohol. Sweetened drinks, such as soda, lemonade, fruit drinks, or punches. ?Sweets and desserts ?Corn syrup. Sugars. Honey. Molasses. Candy. Jam and jelly. Syrup. Sweetened cereals. Cookies. Pies. Cakes. Donuts. Muffins. Ice cream. ?Condiments ?Store-bought sauces, dressings, and marinades that are high in sugar, such as ketchup and barbecue sauce. ?The items listed above may not be a complete list of foods and beverages you should avoid. Talk with your dietitian about what dietary choices are best for you. ?Summary ?High levels of triglycerides can increase the risk of heart disease and stroke. Choosing the right foods can help lower your triglycerides. ?Eat plenty of fresh fruits, vegetables, and whole grains. Choose low-fat dairy and lean meats. Eat fatty fish at least twice a week. ?Avoid processed and prepackaged foods with added sugar, sodium, saturated fat, and trans fat. ?If you need suggestions or have questions about what types of food are good for you, talk with your health care provider or a dietitian. ?This information is not intended to replace advice given to you by your health care provider. Make sure you discuss any  questions you have with your health care provider. ?Document Revised: 02/21/2021 Document Reviewed: 02/21/2021 ?Elsevier Patient Education ? 2023 Elsevier Inc. ? ?

## 2022-03-18 ENCOUNTER — Telehealth: Payer: Self-pay

## 2022-03-18 NOTE — Telephone Encounter (Signed)
Key: [id number]

## 2022-03-20 ENCOUNTER — Other Ambulatory Visit: Payer: Self-pay | Admitting: Internal Medicine

## 2022-03-20 DIAGNOSIS — E781 Pure hyperglyceridemia: Secondary | ICD-10-CM

## 2022-03-20 MED ORDER — ICOSAPENT ETHYL 1 G PO CAPS: 2.0000 g | ORAL_CAPSULE | Freq: Two times a day (BID) | ORAL | 1 refills | Status: DC

## 2022-03-20 NOTE — Telephone Encounter (Signed)
Per CoverMyMeds: PA was denied.  Reason given:  Pt has not tried ALL formulary alternatives.  Alternatives listed are: vascepa, fenofibrate, and gemfibrozil.

## 2022-03-24 ENCOUNTER — Telehealth: Payer: Self-pay

## 2022-03-24 DIAGNOSIS — F4323 Adjustment disorder with mixed anxiety and depressed mood: Secondary | ICD-10-CM | POA: Diagnosis not present

## 2022-03-24 NOTE — Telephone Encounter (Signed)
Approved Effective from 03/24/2022 through 03/23/2023.

## 2022-03-24 NOTE — Telephone Encounter (Signed)
Key: [id number]

## 2022-04-10 ENCOUNTER — Encounter: Payer: Self-pay | Admitting: Internal Medicine

## 2022-04-10 DIAGNOSIS — I10 Essential (primary) hypertension: Secondary | ICD-10-CM

## 2022-04-10 MED ORDER — NEBIVOLOL HCL 5 MG PO TABS: ORAL_TABLET | ORAL | 1 refills | Status: DC

## 2022-04-12 ENCOUNTER — Encounter (HOSPITAL_BASED_OUTPATIENT_CLINIC_OR_DEPARTMENT_OTHER): Payer: Self-pay | Admitting: Obstetrics & Gynecology

## 2022-04-13 ENCOUNTER — Other Ambulatory Visit (HOSPITAL_BASED_OUTPATIENT_CLINIC_OR_DEPARTMENT_OTHER): Payer: Self-pay | Admitting: Obstetrics & Gynecology

## 2022-04-13 MED ORDER — FAMCICLOVIR 500 MG PO TABS: ORAL_TABLET | ORAL | 4 refills | Status: DC

## 2022-04-15 DIAGNOSIS — F4323 Adjustment disorder with mixed anxiety and depressed mood: Secondary | ICD-10-CM | POA: Diagnosis not present

## 2022-04-21 DIAGNOSIS — F4323 Adjustment disorder with mixed anxiety and depressed mood: Secondary | ICD-10-CM | POA: Diagnosis not present

## 2022-04-22 ENCOUNTER — Other Ambulatory Visit (HOSPITAL_BASED_OUTPATIENT_CLINIC_OR_DEPARTMENT_OTHER): Payer: Self-pay | Admitting: *Deleted

## 2022-04-22 ENCOUNTER — Encounter (HOSPITAL_BASED_OUTPATIENT_CLINIC_OR_DEPARTMENT_OTHER): Payer: Self-pay | Admitting: Obstetrics & Gynecology

## 2022-04-22 MED ORDER — ESTRADIOL 10 MCG VA TABS: ORAL_TABLET | VAGINAL | 0 refills | Status: DC

## 2022-04-22 NOTE — Progress Notes (Signed)
Pt requests that rx for vagifem 10 mcg tablets be transferred to [facility] due to cost. Rx sent

## 2022-05-12 DIAGNOSIS — K7581 Nonalcoholic steatohepatitis (NASH): Secondary | ICD-10-CM | POA: Diagnosis not present

## 2022-05-12 DIAGNOSIS — E782 Mixed hyperlipidemia: Secondary | ICD-10-CM | POA: Diagnosis not present

## 2022-05-12 DIAGNOSIS — I2510 Atherosclerotic heart disease of native coronary artery without angina pectoris: Secondary | ICD-10-CM | POA: Diagnosis not present

## 2022-05-12 DIAGNOSIS — E8881 Metabolic syndrome: Secondary | ICD-10-CM | POA: Diagnosis not present

## 2022-05-25 ENCOUNTER — Other Ambulatory Visit: Payer: Self-pay | Admitting: Internal Medicine

## 2022-05-25 DIAGNOSIS — I10 Essential (primary) hypertension: Secondary | ICD-10-CM

## 2022-06-03 ENCOUNTER — Encounter (INDEPENDENT_AMBULATORY_CARE_PROVIDER_SITE_OTHER): Payer: Self-pay

## 2022-06-03 ENCOUNTER — Other Ambulatory Visit: Payer: Self-pay | Admitting: Obstetrics & Gynecology

## 2022-06-03 DIAGNOSIS — Z1231 Encounter for screening mammogram for malignant neoplasm of breast: Secondary | ICD-10-CM

## 2022-06-08 ENCOUNTER — Encounter: Payer: Self-pay | Admitting: Neurology

## 2022-06-08 MED ORDER — BACLOFEN 10 MG PO TABS
5.0000 mg | ORAL_TABLET | Freq: Every evening | ORAL | 0 refills | Status: DC | PRN
Start: 2022-06-08 — End: 2022-07-13

## 2022-06-16 DIAGNOSIS — F4323 Adjustment disorder with mixed anxiety and depressed mood: Secondary | ICD-10-CM | POA: Diagnosis not present

## 2022-06-23 ENCOUNTER — Ambulatory Visit: Payer: BC Managed Care – PPO | Admitting: Neurology

## 2022-06-24 ENCOUNTER — Ambulatory Visit (HOSPITAL_BASED_OUTPATIENT_CLINIC_OR_DEPARTMENT_OTHER): Payer: BC Managed Care – PPO | Admitting: Obstetrics & Gynecology

## 2022-07-02 ENCOUNTER — Encounter: Payer: Self-pay | Admitting: Internal Medicine

## 2022-07-04 ENCOUNTER — Other Ambulatory Visit: Payer: Self-pay | Admitting: Internal Medicine

## 2022-07-04 DIAGNOSIS — I10 Essential (primary) hypertension: Secondary | ICD-10-CM

## 2022-07-05 ENCOUNTER — Encounter: Payer: Self-pay | Admitting: Internal Medicine

## 2022-07-06 ENCOUNTER — Encounter (HOSPITAL_BASED_OUTPATIENT_CLINIC_OR_DEPARTMENT_OTHER): Payer: Self-pay | Admitting: Obstetrics & Gynecology

## 2022-07-06 ENCOUNTER — Other Ambulatory Visit: Payer: Self-pay | Admitting: Internal Medicine

## 2022-07-06 ENCOUNTER — Ambulatory Visit (INDEPENDENT_AMBULATORY_CARE_PROVIDER_SITE_OTHER): Payer: BC Managed Care – PPO | Admitting: Obstetrics & Gynecology

## 2022-07-06 VITALS — BP 124/74 | HR 57 | Ht 63.0 in | Wt 155.4 lb

## 2022-07-06 DIAGNOSIS — G620 Drug-induced polyneuropathy: Secondary | ICD-10-CM

## 2022-07-06 DIAGNOSIS — M8580 Other specified disorders of bone density and structure, unspecified site: Secondary | ICD-10-CM

## 2022-07-06 DIAGNOSIS — N952 Postmenopausal atrophic vaginitis: Secondary | ICD-10-CM

## 2022-07-06 DIAGNOSIS — E781 Pure hyperglyceridemia: Secondary | ICD-10-CM | POA: Diagnosis not present

## 2022-07-06 DIAGNOSIS — G4762 Sleep related leg cramps: Secondary | ICD-10-CM

## 2022-07-06 DIAGNOSIS — Z01419 Encounter for gynecological examination (general) (routine) without abnormal findings: Secondary | ICD-10-CM

## 2022-07-06 DIAGNOSIS — C50412 Malignant neoplasm of upper-outer quadrant of left female breast: Secondary | ICD-10-CM | POA: Diagnosis not present

## 2022-07-06 DIAGNOSIS — N9089 Other specified noninflammatory disorders of vulva and perineum: Secondary | ICD-10-CM

## 2022-07-06 MED ORDER — CLONAZEPAM 1 MG PO TABS: 1.0000 mg | ORAL_TABLET | Freq: Every day | ORAL | 1 refills | Status: DC

## 2022-07-06 MED ORDER — ESTRADIOL 10 MCG VA TABS: ORAL_TABLET | VAGINAL | 3 refills | Status: DC

## 2022-07-06 NOTE — Progress Notes (Signed)
66 y.o. G2P2 Married White or Caucasian female here for annual exam.  Denies vaginal bleeding.  Uses estradiol tablet.  Needs RF.  Had coronary CT scan.  Score was low.  Does have elevated triglycerides.  Interested in additional input about options.  Lipid specialist with [facility] discussed.  Not sure about referral at this time.  I will get more information for her about provider(s).  Did take [place].    Patient's last menstrual period was 06/26/2004.          Sexually active: Yes.    The current method of family planning is post menopausal status.    Exercising: Yes.    Smoker:  no  Health Maintenance: Pap:  03/26/2021 Normal History of abnormal Pap:  no MMG:  09/30/2021 Negative.  Has appt scheduled. Colonoscopy:  10/13/2018, hyperplastic polyp, follow up 5 years BMD:   09/27/2020 Osteopenia Screening Labs: does with Dr Ronnald Ramp   reports that she has never smoked. She has never used smokeless tobacco. She reports current alcohol use of about 7.0 standard drinks of alcohol per week. She reports that she does not use drugs.  Past Medical History:  Diagnosis Date   Avascular necrosis of bone ([place])    Left talus   Blood transfusion    Breast cancer ([place]) 2005   left   Cancer ([place])    Dysplastic nevus 1988   right hip   Fatty liver    GERD (gastroesophageal reflux disease)    History of breast cancer 2005   invasive ductal cancer left breast   Hypertension    Hypertriglyceridemia    Irritable bowel syndrome    Metabolic syndrome    resolved with diet and exercise   NASH (nonalcoholic steatohepatitis)    Nocturnal leg cramps 02/14/2015   Osteopenia    Palpitations 09/2012   normal echo   Peripheral neuropathy    Personal history of chemotherapy    Personal history of radiation therapy    Plantar fasciitis 03/2003   Tongue dysplasia    Ulcer     Past Surgical History:  Procedure Laterality Date   ANKLE SURGERY  07/2007   left, revascularization   BREAST LUMPECTOMY   06/2004   left, with port placement   [place]   x2   COLONOSCOPY  09/2013   Dr. Collene Mares   DG GALL BLADDER     LAPAROSCOPIC CHOLECYSTECTOMY  2003   [place]  11/15   implant/crown placed 4/16   SHOULDER ARTHROSCOPY  10/2005   right   SHOULDER SURGERY  12/15   left   TONGUE SURGERY  2012, 2013   WRIST SURGERY  1988   right, [place]    Current Outpatient Medications  Medication Sig Dispense Refill   baclofen (LIORESAL) 10 MG tablet Take 0.5 tablets (5 mg total) by mouth at bedtime as needed for muscle spasms. 18 each 0   clonazePAM (KLONOPIN) 1 MG tablet Take 1 tablet (1 mg total) by mouth at bedtime. 90 tablet 1   Estradiol 10 MCG TABS vaginal tablet 1 tablet vaginally twice weekly 24 tablet 0   famciclovir (FAMVIR) 500 MG tablet 3 tablets (1560m) at onset of fever blister symptoms 9 tablet 4   famotidine (PEPCID) 10 MG tablet Take 10 mg by mouth daily.     icosapent Ethyl (VASCEPA) 1 g capsule Take 2 capsules (2 g total) by mouth 2 (two) times daily. 360 capsule 1   ketoconazole (NIZORAL) 2 % shampoo SMARTSIG:5 Milliliter(s)  Topical 3 Times a Week     mometasone (ELOCON) 0.1 % ointment Apply topically twice weekly 45 g 1   Multiple Vitamins-Minerals (MULTIVITAMIN PO) Take 1 tablet by mouth daily. With calcium     nebivolol (BYSTOLIC) 5 MG tablet TAKE 1 TABLET(5 MG) BY MOUTH DAILY 90 tablet 1   olmesartan (BENICAR) 5 MG tablet TAKE TWO TABLETS BY MOUTH DAILY 180 tablet 1   Probiotic Product (ALIGN PO) Take 1 tablet by mouth daily.     No current facility-administered medications for this visit.    Family History  Problem Relation Age of Onset   Thyroid disease Mother    Hyperlipidemia Mother    Hypertension Mother    Diabetes Mother    Cancer Mother        melanoma   Heart disease Mother    Depression Mother    Sleep apnea Mother    Obesity Mother    Heart disease Father    Hyperlipidemia Father    Hypertension Father    Cancer Father         colon   Prostate cancer Father    Kidney disease Father    Asthma Sister    Asthma Brother    Breast cancer Neg Hx     ROS: Constitutional: negative Genitourinary:negative  Exam:   BP 124/74 (BP Location: Left Arm, Patient Position: Sitting, Cuff Size: Normal)   Pulse (!) 57   Ht 5' 3"  (1.6 m)   Wt 155 lb 6.4 oz (70.5 kg)   LMP 06/26/2004   BMI 27.53 kg/m   Height: 5' 3"  (160 cm)  General appearance: alert, cooperative and appears stated age Head: Normocephalic, without obvious abnormality, atraumatic Neck: no adenopathy, supple, symmetrical, trachea midline and thyroid normal to inspection and palpation Lungs: clear to auscultation bilaterally Breasts: right breast without masses, skin changes, LAD; left breast with well healed scar and area of stable thickening along scar with radiation changes, no nipple discharge, no LAD Heart: regular rate and rhythm Abdomen: soft, non-tender; bowel sounds normal; no masses,  no organomegaly Extremities: extremities normal, atraumatic, no cyanosis or edema Skin: Skin color, texture, turgor normal. No rashes or lesions Lymph nodes: Cervical, supraclavicular, and axillary nodes normal. No abnormal inguinal nodes palpated Neurologic: Grossly normal   Pelvic: External genitalia:  minimal whitish tissue on perineal body              Urethra:  normal appearing urethra with no masses, tenderness or lesions              Bartholins and Skenes: normal                 Vagina: normal appearing vagina with normal color and no discharge, no lesions              Cervix: no lesions              Pap taken: No. Bimanual Exam:  Uterus:  normal size, contour, position, consistency, mobility, non-tender              Adnexa: normal adnexa and no mass, fullness, tenderness               Rectovaginal: Confirms               Anus:  normal sphincter tone, no lesions  Chaperone, Octaviano Batty, CMA, was present for exam.  Assessment/Plan: 1. Well woman  exam with routine gynecological exam - Pap smear 03/26/2021 with neg HR  HPV.  Not indicated today. - Mammogram 09/30/2021.  Has appt scheduled. - Colonoscopy 10/13/18 with hyperplastic polyp, follow up 5 years - Bone mineral density 09/27/2020 - lab work done done with Dr. Ronnald Ramp - vaccines reviewed/updated  Pap:  03/26/2021 Normal History of abnormal Pap:  no MMG:  09/30/2021 Negative.  Has appt scheduled. Colonoscopy:  10/13/2018, hyperplastic polyp, follow up 5 years BMD:   09/27/2020 Osteopenia Screening Labs: does with Dr Ronnald Ramp  2. Vaginal atrophy - Estradiol 10 MCG TABS vaginal tablet; 1 tablet vaginally twice weekly  Dispense: 24 tablet; Refill: 3  3. Hypertriglyceridemia - suggested cardiology referral to lipid specialist.  Will get some additional information and proceed with referral if she desires.  4. Malignant neoplasm of upper-outer quadrant of left female breast, unspecified estrogen receptor status ([place]) - has been released by oncology  5. Vulvar lesion - biopsied twice in the past showing hyperplasia and hyperkeratosis.  Much improved today compared to AEX visit last year.  6. Osteopenia, unspecified location

## 2022-07-08 ENCOUNTER — Telehealth: Payer: Self-pay | Admitting: Neurology

## 2022-07-08 ENCOUNTER — Telehealth: Payer: Self-pay | Admitting: *Deleted

## 2022-07-08 NOTE — Telephone Encounter (Signed)
Has seen Dr. Jannifer Franklin inn the past.  Has appt with you 07-13-2022 asking if can be video visit. For drug induced neuropathy.

## 2022-07-08 NOTE — Telephone Encounter (Signed)
..   Pt understands that although there may be some limitations with this type of visit, we will take all precautions to reduce any security or privacy concerns.  Pt understands that this will be treated like an in office visit and we will file with pt's insurance, and there may be a patient responsible charge related to this service. ? ?

## 2022-07-09 ENCOUNTER — Ambulatory Visit: Payer: BC Managed Care – PPO | Admitting: Internal Medicine

## 2022-07-13 ENCOUNTER — Encounter: Payer: Self-pay | Admitting: Neurology

## 2022-07-13 ENCOUNTER — Ambulatory Visit (HOSPITAL_BASED_OUTPATIENT_CLINIC_OR_DEPARTMENT_OTHER): Payer: BC Managed Care – PPO | Admitting: Obstetrics & Gynecology

## 2022-07-13 ENCOUNTER — Telehealth (INDEPENDENT_AMBULATORY_CARE_PROVIDER_SITE_OTHER): Payer: BC Managed Care – PPO | Admitting: Neurology

## 2022-07-13 DIAGNOSIS — G620 Drug-induced polyneuropathy: Secondary | ICD-10-CM | POA: Diagnosis not present

## 2022-07-13 DIAGNOSIS — G4762 Sleep related leg cramps: Secondary | ICD-10-CM | POA: Diagnosis not present

## 2022-07-13 MED ORDER — BACLOFEN 10 MG PO TABS: 5.0000 mg | ORAL_TABLET | Freq: Every evening | ORAL | 4 refills | Status: DC | PRN

## 2022-07-13 NOTE — Progress Notes (Signed)
Virtual Visit via Video Note  I connected with Jessica Mack on 07/13/22 at  1:00 PM EDT by a video enabled telemedicine application and verified that I am speaking with the correct person using two identifiers.  Location: Patient: Home  Provider: office   I discussed the limitations of evaluation and management by telemedicine and the availability of in person appointments. The patient expressed understanding and agreed to proceed.   Follow Up Instructions:    I discussed the assessment and treatment plan with the patient. The patient was provided an opportunity to ask questions and all were answered. The patient agreed with the plan and demonstrated an understanding of the instructions.   The patient was advised to call back or seek an in-person evaluation if the symptoms worsen or if the condition fails to improve as anticipated.  I provided 0ver 20 minutes of non-face-to-face time during this encounter.   Melvenia Beam, MD   Reason for visit: Taxol induced neuropathy, nocturnal leg cramps  Jessica Mack is an 66 y.o. female  History of present illness:  07/13/2022: Patient started with Jessica Mack decades ago here at [facility]. She has had neuropathy since chemotherapy she has been without cancer for 18 years and she is thrilled. She had great disability insurance and next year she will 70 and the disability completes. We have been filling out the paperwork for years. She will need the paperwork, she takes ome clonazepam at night for sleep, she was started on baclofen for cramps and changed her life, no changes, stable other than the cramping that increased but the baclofen has been well, she tried many modalities, acupuncture, topicals, medications, she has come to the conclusion she just deals with it. Not worse, intermittently it bothers her. She declines repeating testing like blood work and emg/ncs. Baclofen helps her. She has been stable. She walks her dog.   Patient complains  of symptoms per HPI as well as the following symptoms: medication-induced chemotherapy . Pertinent negatives and positives per HPI. All others negative   06/17/2021: Dr Jannifer Franklin Dr. Narine is a 66 year old Mack-handed white female with a history of breast cancer with a Taxol induced neuropathy issue.  The patient does have neuropathic pain off and on which may be induced by stretching.  More recently, she has been active with biking and doing Pilates.  She has noted a significant increase in the frequency of her nocturnal leg cramps.  She may wake up several times during the evening because of the leg cramps, she is not sleeping and resting well because of this.  She is looking into a weight loss program.  She takes clonazepam at night for sleep but this does not prevent her from waking up with cramps.  She returns the office today for further evaluation.  Past Medical History:  Diagnosis Date   Avascular necrosis of bone ([place])    Left talus   Blood transfusion    Breast cancer ([place]) 2005   left   Cancer ([place])    Dysplastic nevus 1988   Mack hip   Fatty liver    GERD (gastroesophageal reflux disease)    History of breast cancer 2005   invasive ductal cancer left breast   Hypertension    Hypertriglyceridemia    Irritable bowel syndrome    Metabolic syndrome    resolved with diet and exercise   NASH (nonalcoholic steatohepatitis)    Nocturnal leg cramps 02/14/2015   Osteopenia    Palpitations  09/2012   normal echo   Peripheral neuropathy    Personal history of chemotherapy    Personal history of radiation therapy    Plantar fasciitis 03/2003   Tongue dysplasia    Ulcer     Past Surgical History:  Procedure Laterality Date   ANKLE SURGERY  07/2007   left, revascularization   BREAST LUMPECTOMY  06/2004   left, with port placement   [place]   x2   COLONOSCOPY  09/2013   Dr. Collene Mares   DG GALL BLADDER     LAPAROSCOPIC CHOLECYSTECTOMY  2003   [place]   11/15   implant/crown placed 4/16   SHOULDER ARTHROSCOPY  10/2005   Mack   SHOULDER SURGERY  12/15   left   TONGUE SURGERY  2012, 2013   [place]   Mack, [place]    Family History  Problem Relation Age of Onset   Thyroid disease Mother    Hyperlipidemia Mother    Hypertension Mother    Diabetes Mother    Cancer Mother        melanoma   Heart disease Mother    Depression Mother    Sleep apnea Mother    Obesity Mother    Heart disease Father    Hyperlipidemia Father    Hypertension Father    Cancer Father        colon   Prostate cancer Father    Kidney disease Father    Asthma Sister    Asthma Brother    Breast cancer Neg Hx     Social history:  reports that she has never smoked. She has never used smokeless tobacco. She reports current alcohol use of about 7.0 standard drinks of alcohol per week. She reports that she does not use drugs.    Allergies  Allergen Reactions   Augmentin [Amoxicillin-Pot Clavulanate]     abd cramping   Cephalexin     Profuse diarrhea   Crestor [Rosuvastatin]     Liver function elevated     Medications:  Prior to Admission medications   Medication Sig Start Date End Date Taking? Authorizing Provider  clonazePAM (KLONOPIN) 1 MG tablet TAKE 1 TABLET(1 MG) BY MOUTH AT BEDTIME AS NEEDED FOR ANXIETY 02/19/21  Yes Janith Lima, MD  Estradiol 10 MCG TABS vaginal tablet 1 tablet vaginally twice weekly 10/01/20  Yes Nunzio Cobbs, MD  famciclovir [facility]) 500 MG tablet 3 tablets (1528m) at onset of fever blister symptoms 10/28/18  Yes MMegan Salon MD  famotidine (PEPCID) 10 MG tablet Take 10 mg by mouth daily.   Yes [provider]  ketoconazole (NIZORAL) 2 % shampoo SMARTSIG:5 Milliliter(s) Topical 3 Times a Week 07/02/20  Yes [provider]  mometasone (ELOCON) 0.1 % ointment Apply topically twice weekly 06/16/21  Yes MMegan Salon MD  Multiple Vitamins-Minerals (MULTIVITAMIN PO) Take 1 tablet  by mouth daily. With calcium   Yes [provider]  nebivolol (BYSTOLIC) 5 MG tablet Take 1 tablet (5 mg total) by mouth daily. 12/31/20  Yes JJanith Lima MD  olmesartan (BENICAR) 20 MG tablet TAKE 1 TABLET(20 MG) BY MOUTH DAILY 04/18/21  Yes JJanith Lima MD  omega-3 acid ethyl esters (LOVAZA) 1 g capsule TABLET 2 CAPSULES BY MOUTH TWICE DAILY 01/18/21  Yes JJanith Lima MD  pioglitazone (ACTOS) 15 MG tablet TAKE 1 TABLET(15 MG) BY MOUTH DAILY 02/26/21  Yes JJanith Lima MD  Probiotic Product (ALIGN PO) Take 1 tablet by mouth daily.   Yes [provider]     Physical exam: Exam: Gen: NAD, conversant      CV: attempted, Could not perform over Web Video. Denies palpitations or chest pain or SOB. VS: Breathing at a normal rate. Not febrile. Eyes: Conjunctivae clear without exudates or hemorrhage  Neuro: Detailed Neurologic Exam  Speech:    Speech is normal; fluent and spontaneous with normal comprehension.  Cognition:    The patient is oriented to person, place, and time;     recent and remote memory intact;     language fluent;     normal attention, concentration,     fund of knowledge Cranial Nerves:    The pupils are equal, round, and reactive to light. Cannot perform fundoscopic exam. Visual fields are full to finger confrontation. Extraocular movements are intact.  The face is symmetric with normal sensation. The palate elevates in the midline. Hearing intact. Voice is normal. Shoulder shrug is normal. The tongue has normal motion without fasciculations.   Coordination:    Normal finger to nose  Gait:    Normal native gait  Motor Observation:   no involuntary movements noted. Tone:    Appears normal  Posture:    Posture is normal. normal erect    Strength:    Strength is anti-gravity and symmetric in the upper and lower limbs.     Assessment/Plan: Chemotherapy-incudes neuropathy and leg cramps  1.  Nocturnal leg cramps: The patient is  having a lot of issues with nocturnal leg cramps.  She has irritable bowel syndrome with associated diarrhea and probably can not tolerate magnesium supplementation.  We will continue low-dose baclofen taking 5-10 mg at night. Take the lowest possible.   2.  Taxol induced neuropathy/neuronopathy  3. We will continue to fill out disability as we have for many years  Patient understands both clonazepam and baclofen can cause sedation, she has been on this regimen for years, she is a physician, she understands and minimizes sedation, takes them at different times and is very reliable and knowledgeable and understands perfectly the side effects and increased sedation of taking multiple sedating drugs and not taking other medications that could cause side effects with these. Watch for falls and sedation. Discussed in detail.   Follow up prn can be video with Dr. Jaynee Eagles or Judson Roch prn  Cc: Dr. Doylene Bode, MD  [facility] [address] [place], [place] [address]  Phone [phone] Fax [phone]

## 2022-07-23 DIAGNOSIS — F4323 Adjustment disorder with mixed anxiety and depressed mood: Secondary | ICD-10-CM | POA: Diagnosis not present

## 2022-08-04 DIAGNOSIS — Z0289 Encounter for other administrative examinations: Secondary | ICD-10-CM

## 2022-08-05 IMAGING — MG MM DIGITAL SCREENING BILAT W/ TOMO AND CAD
8 series · 8 of 24 positions shown · non-contrast
Comparison: Previous exam(s).

CLINICAL DATA: Screening.

EXAM:
DIGITAL SCREENING BILATERAL MAMMOGRAM WITH TOMOSYNTHESIS AND CAD
TECHNIQUE: Bilateral screening digital craniocaudal and mediolateral oblique
mammograms were obtained. Bilateral screening digital breast
tomosynthesis was performed. The images were evaluated with
computer-aided detection.

[L MLO synth-2D]
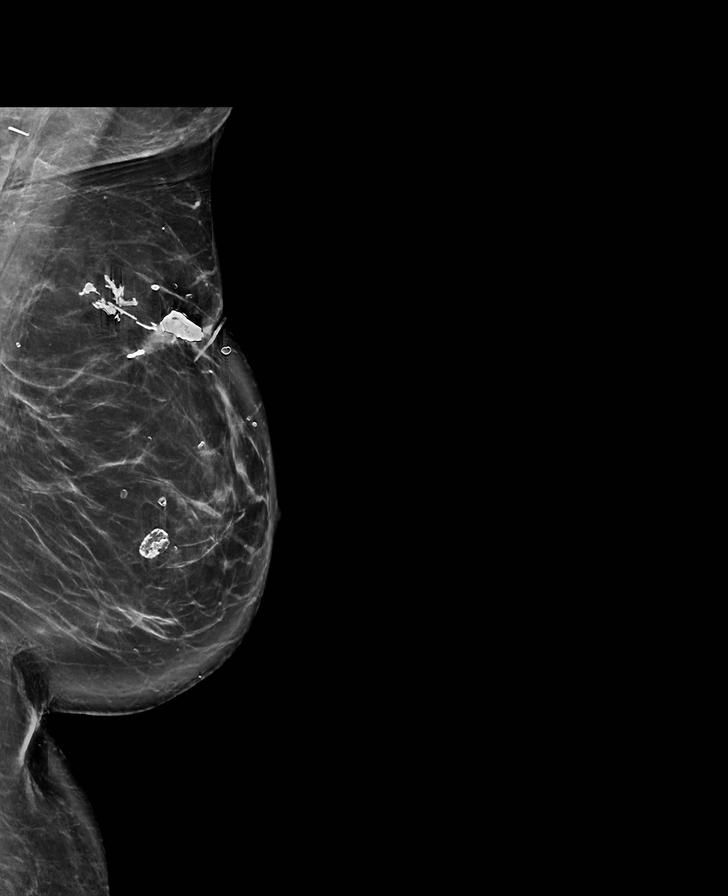

[L CC synth-2D]
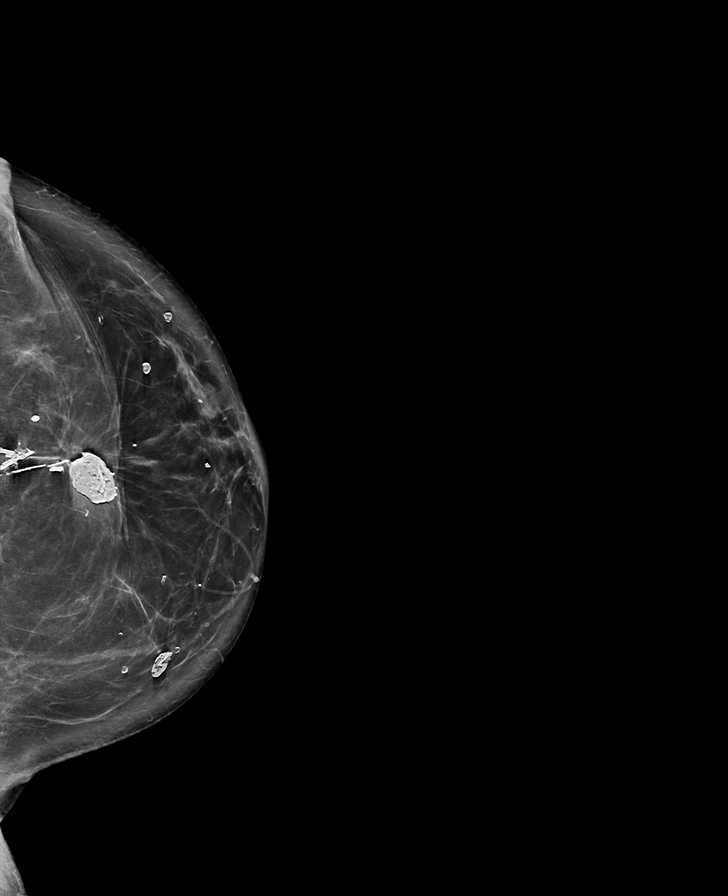

[R CC synth-2D]
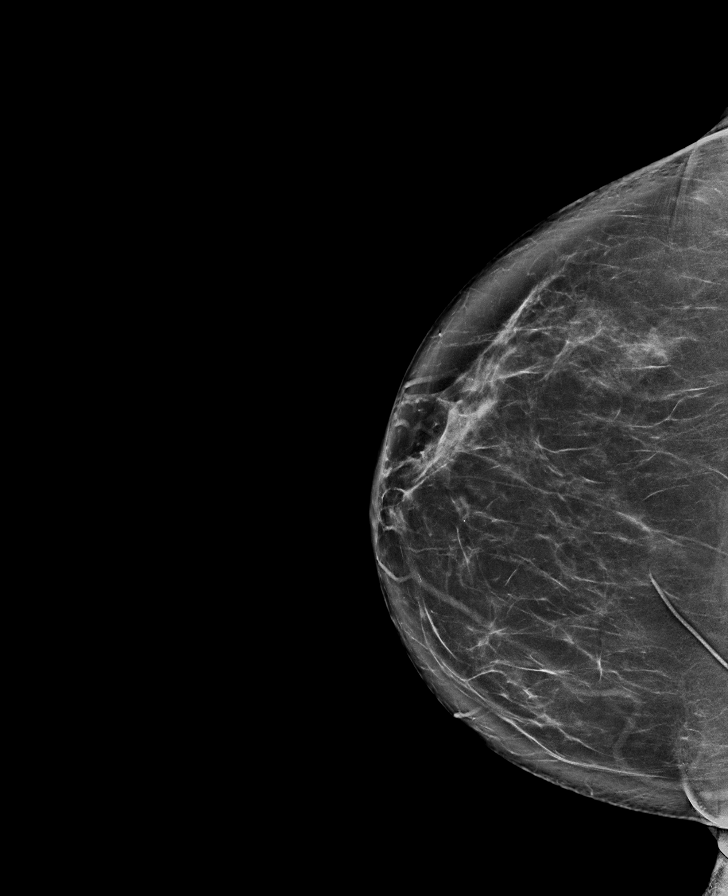

[R MLO synth-2D]
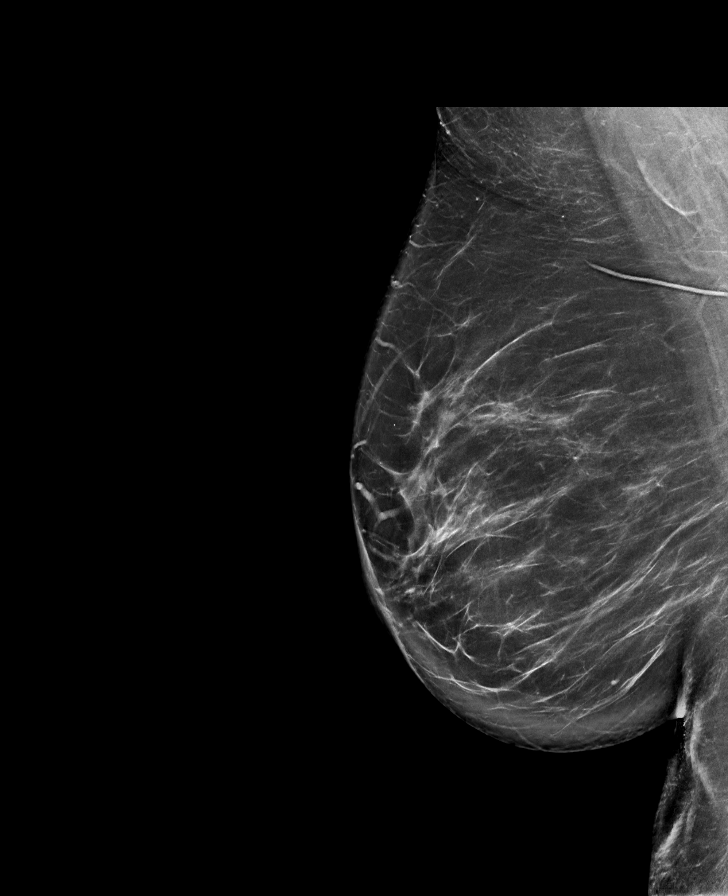

[R CC tomo · tomo slice 44/87.0]
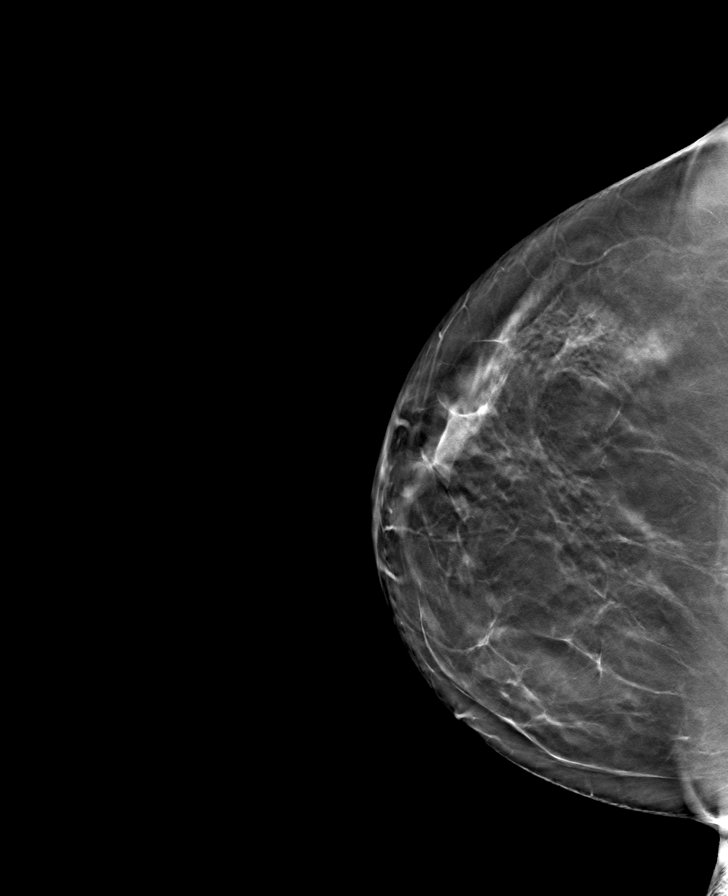

[R MLO tomo · tomo slice 49/98.0]
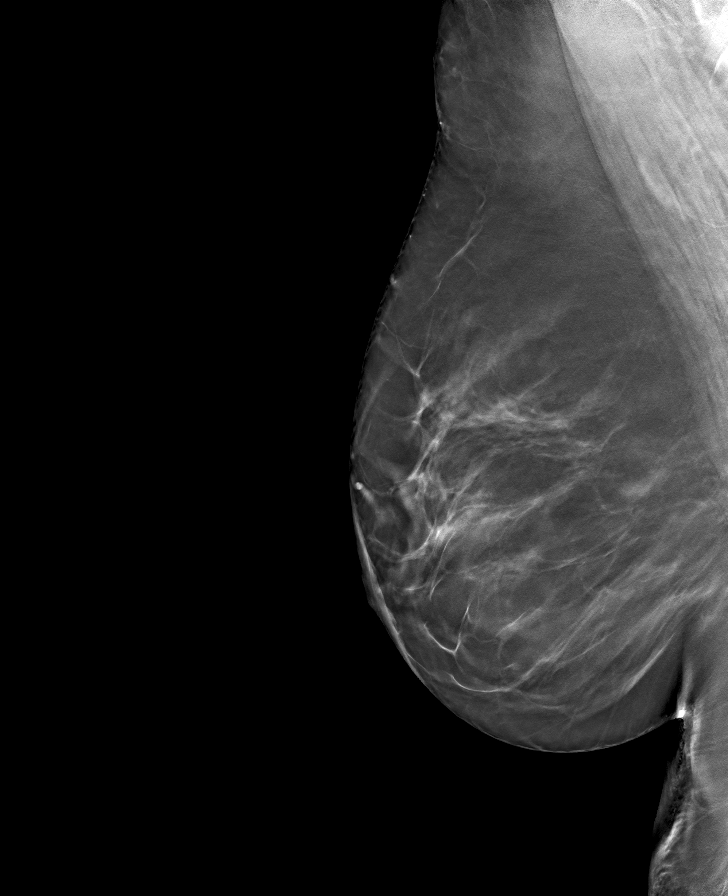

[L CC tomo · tomo slice 41/81.0]
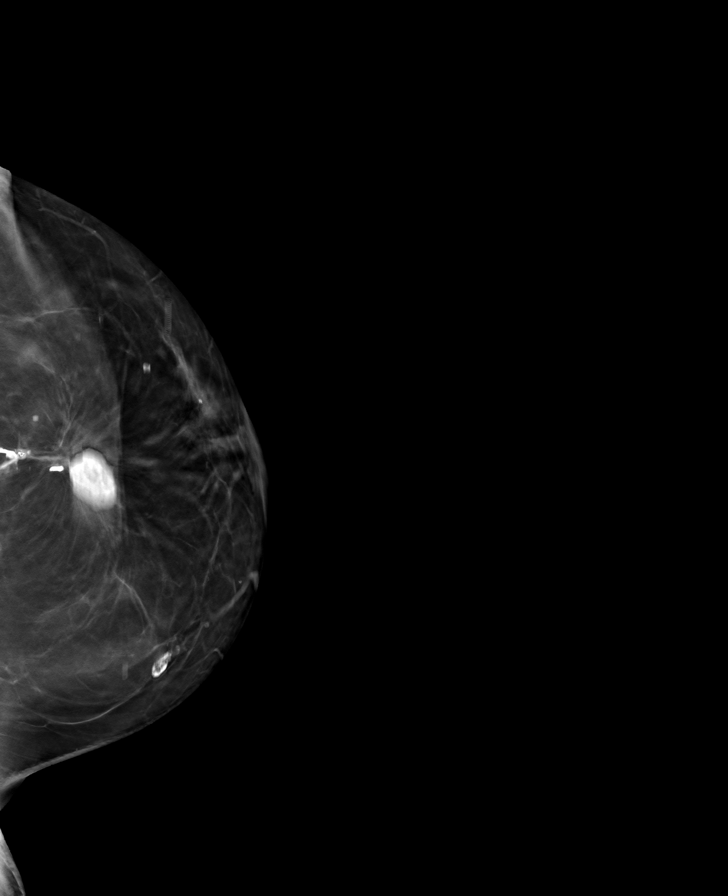

[L MLO tomo · tomo slice 45/88.0]
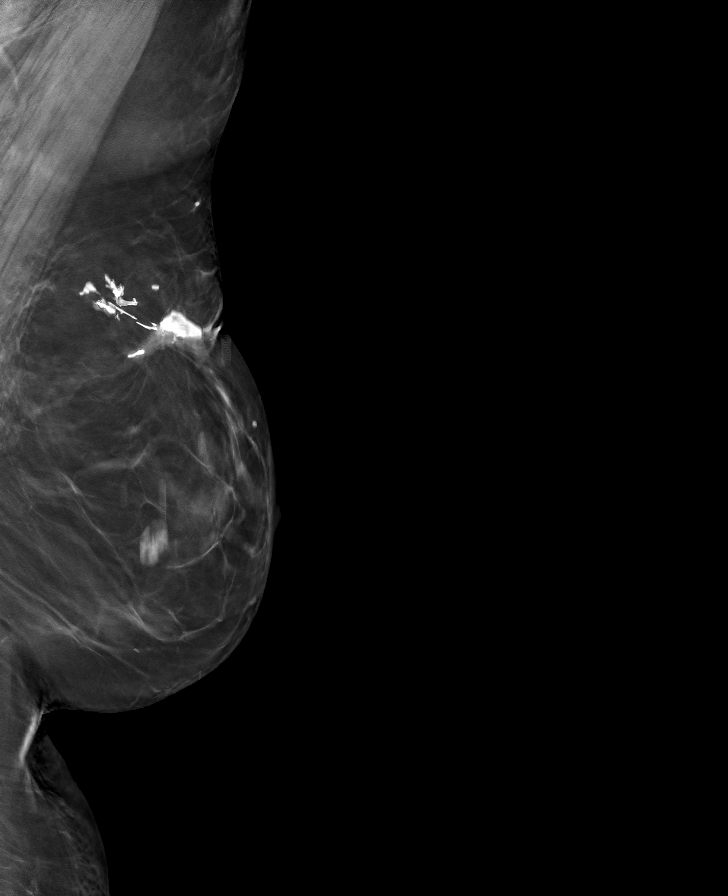

[8 of 24 positions shown; findings below may reference images not displayed]

ACR Breast Density Category b: There are scattered areas of
fibroglandular density.
FINDINGS: There are no findings suspicious for malignancy.
IMPRESSION: No mammographic evidence of malignancy. A result letter of this
screening mammogram will be mailed directly to the patient.

RECOMMENDATION:
Screening mammogram in one year. (Code:51-O-LD2)

BI-RADS CATEGORY  1: Negative.

## 2022-08-06 ENCOUNTER — Telehealth: Payer: Self-pay | Admitting: *Deleted

## 2022-08-06 ENCOUNTER — Telehealth: Payer: Self-pay | Admitting: Neurology

## 2022-08-06 NOTE — Telephone Encounter (Signed)
[employer] disability form completed, signed, and sent to medical records for processing.

## 2022-08-06 NOTE — Telephone Encounter (Signed)
Disability paperwork mailed to pt on 08/06/22

## 2022-08-20 ENCOUNTER — Other Ambulatory Visit: Payer: Self-pay | Admitting: Internal Medicine

## 2022-08-20 DIAGNOSIS — E781 Pure hyperglyceridemia: Secondary | ICD-10-CM

## 2022-08-20 DIAGNOSIS — F4323 Adjustment disorder with mixed anxiety and depressed mood: Secondary | ICD-10-CM | POA: Diagnosis not present

## 2022-08-27 ENCOUNTER — Other Ambulatory Visit: Payer: Self-pay | Admitting: Internal Medicine

## 2022-08-27 DIAGNOSIS — E781 Pure hyperglyceridemia: Secondary | ICD-10-CM

## 2022-09-09 DIAGNOSIS — L570 Actinic keratosis: Secondary | ICD-10-CM | POA: Diagnosis not present

## 2022-09-09 DIAGNOSIS — D2262 Melanocytic nevi of left upper limb, including shoulder: Secondary | ICD-10-CM | POA: Diagnosis not present

## 2022-09-09 DIAGNOSIS — D2371 Other benign neoplasm of skin of right lower limb, including hip: Secondary | ICD-10-CM | POA: Diagnosis not present

## 2022-09-09 DIAGNOSIS — B353 Tinea pedis: Secondary | ICD-10-CM | POA: Diagnosis not present

## 2022-09-09 DIAGNOSIS — L814 Other melanin hyperpigmentation: Secondary | ICD-10-CM | POA: Diagnosis not present

## 2022-09-28 ENCOUNTER — Ambulatory Visit (INDEPENDENT_AMBULATORY_CARE_PROVIDER_SITE_OTHER): Payer: BC Managed Care – PPO | Admitting: Internal Medicine

## 2022-09-28 ENCOUNTER — Encounter: Payer: Self-pay | Admitting: Internal Medicine

## 2022-09-28 ENCOUNTER — Ambulatory Visit (INDEPENDENT_AMBULATORY_CARE_PROVIDER_SITE_OTHER): Payer: BC Managed Care – PPO

## 2022-09-28 VITALS — BP 134/82 | HR 66 | Temp 98.3°F | Resp 16 | Ht 63.0 in | Wt 159.0 lb

## 2022-09-28 DIAGNOSIS — J4530 Mild persistent asthma, uncomplicated: Secondary | ICD-10-CM | POA: Insufficient documentation

## 2022-09-28 DIAGNOSIS — I10 Essential (primary) hypertension: Secondary | ICD-10-CM

## 2022-09-28 DIAGNOSIS — R7989 Other specified abnormal findings of blood chemistry: Secondary | ICD-10-CM

## 2022-09-28 DIAGNOSIS — E781 Pure hyperglyceridemia: Secondary | ICD-10-CM | POA: Diagnosis not present

## 2022-09-28 DIAGNOSIS — R059 Cough, unspecified: Secondary | ICD-10-CM | POA: Diagnosis not present

## 2022-09-28 DIAGNOSIS — K7581 Nonalcoholic steatohepatitis (NASH): Secondary | ICD-10-CM

## 2022-09-28 DIAGNOSIS — R052 Subacute cough: Secondary | ICD-10-CM

## 2022-09-28 MED ORDER — MOMETASONE FURO-FORMOTEROL FUM 100-5 MCG/ACT IN AERO: 2.0000 | INHALATION_SPRAY | Freq: Two times a day (BID) | RESPIRATORY_TRACT | 1 refills | Status: DC

## 2022-09-28 MED ORDER — QVAR REDIHALER 40 MCG/ACT IN AERB: 1.0000 | INHALATION_SPRAY | Freq: Two times a day (BID) | RESPIRATORY_TRACT | 1 refills | Status: DC

## 2022-09-28 MED ORDER — BENZONATATE 100 MG PO CAPS: 100.0000 mg | ORAL_CAPSULE | Freq: Three times a day (TID) | ORAL | 1 refills | Status: DC | PRN

## 2022-09-28 NOTE — Patient Instructions (Signed)

## 2022-09-28 NOTE — Progress Notes (Signed)
Subjective:  Patient ID: Jessica Mack, female    DOB: 01-Jul-1956  Age: 66 y.o. MRN: [id number]  CC: Hypertension, Hyperlipidemia, Cough, and Asthma   HPI Jessica Mack presents for f/up -  She complains of a 6-week history of cough productive of clear phlegm with wheezing.  She has gotten some symptom relief with albuterol and Tessalon.  She denies chest pain, night sweats, fever, chills, or diaphoresis.  Outpatient Medications Prior to Visit  Medication Sig Dispense Refill   baclofen (LIORESAL) 10 MG tablet Take 0.5-1 tablets (5-10 mg total) by mouth at bedtime as needed for muscle spasms. 90 each 4   clonazePAM (KLONOPIN) 1 MG tablet Take 1 tablet (1 mg total) by mouth at bedtime. 90 tablet 1   Estradiol 10 MCG TABS vaginal tablet 1 tablet vaginally twice weekly 24 tablet 3   famciclovir (FAMVIR) 500 MG tablet 3 tablets (1550m) at onset of fever blister symptoms 9 tablet 4   famotidine (PEPCID) 10 MG tablet Take 10 mg by mouth daily.     icosapent Ethyl (VASCEPA) 1 g capsule Take 2 capsules (2 g total) by mouth 2 (two) times daily. 360 capsule 1   ketoconazole (NIZORAL) 2 % shampoo SMARTSIG:5 Milliliter(s) Topical 3 Times a Week     mometasone (ELOCON) 0.1 % ointment Apply topically twice weekly 45 g 1   Multiple Vitamins-Minerals (MULTIVITAMIN PO) Take 1 tablet by mouth daily. With calcium     nebivolol (BYSTOLIC) 5 MG tablet TAKE 1 TABLET(5 MG) BY MOUTH DAILY 90 tablet 1   olmesartan (BENICAR) 5 MG tablet TAKE TWO TABLETS BY MOUTH DAILY 180 tablet 1   Probiotic Product (ALIGN PO) Take 1 tablet by mouth daily.     No facility-administered medications prior to visit.    ROS Review of Systems  Constitutional: Negative.  Negative for chills, diaphoresis, fatigue and fever.  HENT: Negative.    Eyes: Negative.   Respiratory:  Positive for cough and wheezing. Negative for chest tightness and shortness of breath.   Cardiovascular:  Negative for chest pain, palpitations and leg  swelling.  Gastrointestinal:  Negative for abdominal pain, constipation, diarrhea, nausea and vomiting.  Genitourinary: Negative.  Negative for difficulty urinating.  Musculoskeletal: Negative.  Negative for arthralgias, back pain, myalgias and neck pain.  Skin: Negative.  Negative for color change and pallor.  Neurological: Negative.  Negative for dizziness, weakness and light-headedness.  Hematological:  Negative for adenopathy. Does not bruise/bleed easily.  Psychiatric/Behavioral: Negative.      Objective:  BP 134/82 (BP Location: Left Arm, Patient Position: Sitting, Cuff Size: Large)   Pulse 66   Temp 98.3 F (36.8 C) (Oral)   Resp 16   Ht 5' 3"  (1.6 m)   Wt 159 lb (72.1 kg)   LMP 06/26/2004   SpO2 98%   BMI 28.17 kg/m   BP Readings from Last 3 Encounters:  09/28/22 134/82  07/06/22 124/74  03/17/22 126/76    Wt Readings from Last 3 Encounters:  09/28/22 159 lb (72.1 kg)  07/06/22 155 lb 6.4 oz (70.5 kg)  03/17/22 151 lb (68.5 kg)    Physical Exam Vitals reviewed.  HENT:     Mouth/Throat:     Mouth: Mucous membranes are moist.  Eyes:     General: No scleral icterus.    Conjunctiva/sclera: Conjunctivae normal.  Cardiovascular:     Rate and Rhythm: Normal rate and regular rhythm.     Heart sounds: No murmur heard. Pulmonary:  Effort: Pulmonary effort is normal.     Breath sounds: No stridor. No wheezing, rhonchi or rales.  Abdominal:     General: Abdomen is flat.     Palpations: There is no mass.     Tenderness: There is no abdominal tenderness. There is no guarding.     Hernia: No hernia is present.  Musculoskeletal:        General: Normal range of motion.     Cervical back: Neck supple.     Right lower leg: No edema.     Left lower leg: No edema.  Lymphadenopathy:     Cervical: No cervical adenopathy.  Skin:    General: Skin is warm and dry.  Neurological:     General: No focal deficit present.     Mental Status: She is alert.  Psychiatric:         Mood and Affect: Mood normal.        Behavior: Behavior normal.     Lab Results  Component Value Date   WBC 4.3 02/10/2022   HGB 13.9 02/10/2022   HCT 41 02/10/2022   PLT 231 02/10/2022   GLUCOSE 110 (H) 10/02/2022   CHOL 238 (H) 10/02/2022   TRIG 187.0 (H) 10/02/2022   TRIG 187.0 (H) 10/02/2022   HDL 52.40 10/02/2022   LDLDIRECT 116.0 05/31/2020   LDLCALC 148 (H) 10/02/2022   ALT 48 (H) 10/02/2022   AST 37 10/02/2022   NA 139 10/02/2022   K 4.1 10/02/2022   CL 105 10/02/2022   CREATININE 0.77 10/02/2022   BUN 18 10/02/2022   CO2 26 10/02/2022   TSH 2.14 02/10/2022   HGBA1C 5.3 02/10/2022    DG Chest 2 View  Result Date: 03/13/2022 CLINICAL DATA:  Cough. EXAM: CHEST - 2 VIEW COMPARISON:  July 01, 2004. FINDINGS: The heart size and mediastinal contours are within normal limits. Both lungs are clear. The visualized skeletal structures are unremarkable. IMPRESSION: No active cardiopulmonary disease. Electronically Signed   By: Marijo Conception M.D.   On: 03/13/2022 14:33   MM 3D SCREEN BREAST BILATERAL  Result Date: 10/01/2022 CLINICAL DATA:  Screening. LEFT lumpectomy with chemotherapy and radiation 2005. EXAM: DIGITAL SCREENING BILATERAL MAMMOGRAM WITH TOMOSYNTHESIS AND CAD TECHNIQUE: Bilateral screening digital craniocaudal and mediolateral oblique mammograms were obtained. Bilateral screening digital breast tomosynthesis was performed. The images were evaluated with computer-aided detection. COMPARISON:  Previous exam(s). ACR Breast Density Category b: There are scattered areas of fibroglandular density. FINDINGS: There are no findings suspicious for malignancy. IMPRESSION: No mammographic evidence of malignancy. A result letter of this screening mammogram will be mailed directly to the patient. RECOMMENDATION: Screening mammogram in one year. (Code:SM-B-01Y) BI-RADS CATEGORY  1: Negative. Electronically Signed   By: Nolon Nations M.D.   On: 10/01/2022 09:17   DG  Chest 2 View  Result Date: 09/28/2022 CLINICAL DATA:  Provided history: Subacute cough. Cough for 6 weeks. Additional history provided by technologist: Patient reports cough/congestion for 7 weeks. History of hypertension, breast cancer, asthma. EXAM: CHEST - 2 VIEW COMPARISON:  Prior chest radiographs 03/13/2022 and earlier. Chest CT 07/19/2006 FINDINGS: Heart size within normal limits. No appreciable airspace consolidation. No evidence of pleural effusion or pneumothorax. No acute bony abnormality identified. 1.6 cm ovoid calcified focus within the left chest soft tissues, not appreciably changed from the prior chest radiographs of 03/13/2022. IMPRESSION: No evidence of active cardiopulmonary disease. 1.6 cm ovoid calcified focus within the left chest soft tissues, nonspecific and not appreciably changed  from the prior chest radiographs of 03/13/2022. Electronically Signed   By: Kellie Simmering D.O.   On: 09/28/2022 13:58     Assessment & Plan:   Jessica Mack was seen today for hypertension, hyperlipidemia, cough and asthma.  Diagnoses and all orders for this visit:  Hypertriglyceridemia- Improvement noted. -     Triglycerides; Future -     Lipid panel; Future  Subacute cough- Chest x-ray is negative for mass or infiltrate. -     DG Chest 2 View; Future -     benzonatate (TESSALON) 100 MG capsule; Take 1 capsule (100 mg total) by mouth 3 (three) times daily as needed for cough.  NASH (nonalcoholic steatohepatitis)- She is working on her lifestyle modifications. -     Hepatic function panel; Future  Essential hypertension- Her blood pressure is adequately well-controlled. -     Basic metabolic panel; Future  Mild persistent asthma without complication- Will treat with an ICS. -     Discontinue: beclomethasone (QVAR REDIHALER) 40 MCG/ACT inhaler; Inhale 1 puff into the lungs 2 (two) times daily. -     Discontinue: mometasone-formoterol (DULERA) 100-5 MCG/ACT AERO; Inhale 2 puffs into the lungs 2 (two)  times daily. -     fluticasone (FLOVENT HFA) 110 MCG/ACT inhaler; Inhale 1 puff into the lungs 2 (two) times daily.  Elevated ferritin level- Ferritin level is down to 385. -     IBC + Ferritin; Future   I have discontinued Rudi Rummage Jager's Qvar RediHaler and mometasone-formoterol. I am also having her start on benzonatate and fluticasone. Additionally, I am having her maintain her Multiple Vitamins-Minerals (MULTIVITAMIN PO), Probiotic Product (ALIGN PO), famotidine, ketoconazole, mometasone, icosapent Ethyl, famciclovir, nebivolol, olmesartan, clonazePAM, Estradiol, and baclofen.  Meds ordered this encounter  Medications   DISCONTD: beclomethasone (QVAR REDIHALER) 40 MCG/ACT inhaler    Sig: Inhale 1 puff into the lungs 2 (two) times daily.    Dispense:  3 each    Refill:  1   benzonatate (TESSALON) 100 MG capsule    Sig: Take 1 capsule (100 mg total) by mouth 3 (three) times daily as needed for cough.    Dispense:  90 capsule    Refill:  1   DISCONTD: mometasone-formoterol (DULERA) 100-5 MCG/ACT AERO    Sig: Inhale 2 puffs into the lungs 2 (two) times daily.    Dispense:  39 g    Refill:  1   fluticasone (FLOVENT HFA) 110 MCG/ACT inhaler    Sig: Inhale 1 puff into the lungs 2 (two) times daily.    Dispense:  3 each    Refill:  1     Follow-up: Return in about 3 months (around 12/28/2022).  Scarlette Calico, MD

## 2022-09-29 ENCOUNTER — Encounter: Payer: Self-pay | Admitting: Internal Medicine

## 2022-09-29 MED ORDER — FLUTICASONE PROPIONATE HFA 110 MCG/ACT IN AERO: 1.0000 | INHALATION_SPRAY | Freq: Two times a day (BID) | RESPIRATORY_TRACT | 1 refills | Status: DC

## 2022-10-01 ENCOUNTER — Ambulatory Visit
Admission: RE | Admit: 2022-10-01 | Discharge: 2022-10-01 | Disposition: A | Discharge status: Home / Self Care | Payer: BC Managed Care – PPO | Source: Ambulatory Visit | Attending: Obstetrics & Gynecology | Admitting: Obstetrics & Gynecology

## 2022-10-01 DIAGNOSIS — Z1231 Encounter for screening mammogram for malignant neoplasm of breast: Secondary | ICD-10-CM

## 2022-10-02 ENCOUNTER — Other Ambulatory Visit (INDEPENDENT_AMBULATORY_CARE_PROVIDER_SITE_OTHER): Payer: BC Managed Care – PPO

## 2022-10-02 DIAGNOSIS — K7581 Nonalcoholic steatohepatitis (NASH): Secondary | ICD-10-CM

## 2022-10-02 DIAGNOSIS — E781 Pure hyperglyceridemia: Secondary | ICD-10-CM

## 2022-10-02 DIAGNOSIS — I10 Essential (primary) hypertension: Secondary | ICD-10-CM

## 2022-10-02 DIAGNOSIS — R7989 Other specified abnormal findings of blood chemistry: Secondary | ICD-10-CM

## 2022-10-02 LAB — LIPID PANEL
Cholesterol: 238 mg/dL — ABNORMAL HIGH (ref 0–200)
HDL: 52.4 mg/dL (ref >39.00)
LDL Cholesterol: 148 mg/dL — ABNORMAL HIGH (ref 0–99)
NonHDL: 185.6
Total CHOL/HDL Ratio: 5
Triglycerides: 187 mg/dL — ABNORMAL HIGH (ref 0.0–149.0)
VLDL: 37.4 mg/dL (ref 0.0–40.0)

## 2022-10-02 LAB — BASIC METABOLIC PANEL
BUN: 18 mg/dL (ref 6–23)
CO2: 26 mEq/L (ref 19–32)
Calcium: 9.4 mg/dL (ref 8.4–10.5)
Chloride: 105 mEq/L (ref 96–112)
Creatinine, Ser: 0.77 mg/dL (ref 0.40–1.20)
GFR: 81.63 mL/min (ref >60.00)
Glucose, Bld: 110 mg/dL — ABNORMAL HIGH (ref 70–99)
Potassium: 4.1 mEq/L (ref 3.5–5.1)
Sodium: 139 mEq/L (ref 135–145)

## 2022-10-02 LAB — HEPATIC FUNCTION PANEL
ALT: 48 U/L — ABNORMAL HIGH (ref 0–35)
AST: 37 U/L (ref 0–37)
Albumin: 4.7 g/dL (ref 3.5–5.2)
Alkaline Phosphatase: 52 U/L (ref 39–117)
Bilirubin, Direct: 0.1 mg/dL (ref 0.0–0.3)
Total Bilirubin: 0.6 mg/dL (ref 0.2–1.2)
Total Protein: 7.3 g/dL (ref 6.0–8.3)

## 2022-10-02 LAB — IBC + FERRITIN
Ferritin: 384.9 ng/mL — ABNORMAL HIGH (ref 10.0–291.0)
Iron: 115 ug/dL (ref 42–145)
Saturation Ratios: 28.2 % (ref 20.0–50.0)
TIBC: 407.4 ug/dL (ref 250.0–450.0)
Transferrin: 291 mg/dL (ref 212.0–360.0)

## 2022-10-02 LAB — TRIGLYCERIDES: Triglycerides: 187 mg/dL — ABNORMAL HIGH (ref 0.0–149.0)

## 2022-10-06 DIAGNOSIS — F4323 Adjustment disorder with mixed anxiety and depressed mood: Secondary | ICD-10-CM | POA: Diagnosis not present

## 2022-10-25 ENCOUNTER — Other Ambulatory Visit: Payer: Self-pay | Admitting: Internal Medicine

## 2022-10-25 DIAGNOSIS — I10 Essential (primary) hypertension: Secondary | ICD-10-CM

## 2022-12-01 DIAGNOSIS — F4323 Adjustment disorder with mixed anxiety and depressed mood: Secondary | ICD-10-CM | POA: Diagnosis not present

## 2022-12-17 DIAGNOSIS — F4323 Adjustment disorder with mixed anxiety and depressed mood: Secondary | ICD-10-CM | POA: Diagnosis not present

## 2022-12-21 DIAGNOSIS — K7581 Nonalcoholic steatohepatitis (NASH): Secondary | ICD-10-CM | POA: Diagnosis not present

## 2022-12-21 DIAGNOSIS — I2510 Atherosclerotic heart disease of native coronary artery without angina pectoris: Secondary | ICD-10-CM | POA: Diagnosis not present

## 2022-12-21 DIAGNOSIS — E663 Overweight: Secondary | ICD-10-CM | POA: Diagnosis not present

## 2022-12-21 DIAGNOSIS — E782 Mixed hyperlipidemia: Secondary | ICD-10-CM | POA: Diagnosis not present

## 2022-12-27 ENCOUNTER — Encounter: Payer: Self-pay | Admitting: Internal Medicine

## 2022-12-28 ENCOUNTER — Ambulatory Visit: Payer: BC Managed Care – PPO | Admitting: Internal Medicine

## 2022-12-31 DIAGNOSIS — F4323 Adjustment disorder with mixed anxiety and depressed mood: Secondary | ICD-10-CM | POA: Diagnosis not present

## 2023-01-01 ENCOUNTER — Other Ambulatory Visit: Payer: Self-pay | Admitting: Internal Medicine

## 2023-01-01 DIAGNOSIS — I10 Essential (primary) hypertension: Secondary | ICD-10-CM

## 2023-01-14 DIAGNOSIS — F4323 Adjustment disorder with mixed anxiety and depressed mood: Secondary | ICD-10-CM | POA: Diagnosis not present

## 2023-01-27 DIAGNOSIS — K7581 Nonalcoholic steatohepatitis (NASH): Secondary | ICD-10-CM | POA: Diagnosis not present

## 2023-01-27 DIAGNOSIS — I2510 Atherosclerotic heart disease of native coronary artery without angina pectoris: Secondary | ICD-10-CM | POA: Diagnosis not present

## 2023-01-27 DIAGNOSIS — E782 Mixed hyperlipidemia: Secondary | ICD-10-CM | POA: Diagnosis not present

## 2023-01-27 DIAGNOSIS — R7301 Impaired fasting glucose: Secondary | ICD-10-CM | POA: Diagnosis not present

## 2023-01-28 DIAGNOSIS — F4323 Adjustment disorder with mixed anxiety and depressed mood: Secondary | ICD-10-CM | POA: Diagnosis not present

## 2023-02-11 DIAGNOSIS — F4323 Adjustment disorder with mixed anxiety and depressed mood: Secondary | ICD-10-CM | POA: Diagnosis not present

## 2023-02-12 ENCOUNTER — Other Ambulatory Visit: Payer: Self-pay | Admitting: Internal Medicine

## 2023-02-12 ENCOUNTER — Encounter: Payer: Self-pay | Admitting: Internal Medicine

## 2023-02-12 DIAGNOSIS — G4762 Sleep related leg cramps: Secondary | ICD-10-CM

## 2023-02-12 DIAGNOSIS — E781 Pure hyperglyceridemia: Secondary | ICD-10-CM

## 2023-02-12 DIAGNOSIS — G620 Drug-induced polyneuropathy: Secondary | ICD-10-CM

## 2023-02-12 MED ORDER — ICOSAPENT ETHYL 1 G PO CAPS: 2.0000 g | ORAL_CAPSULE | Freq: Two times a day (BID) | ORAL | 1 refills | Status: DC

## 2023-02-12 MED ORDER — CLONAZEPAM 1 MG PO TABS: 1.0000 mg | ORAL_TABLET | Freq: Every day | ORAL | 1 refills | Status: DC

## 2023-02-23 DIAGNOSIS — F4323 Adjustment disorder with mixed anxiety and depressed mood: Secondary | ICD-10-CM | POA: Diagnosis not present

## 2023-03-02 DIAGNOSIS — H43813 Vitreous degeneration, bilateral: Secondary | ICD-10-CM | POA: Diagnosis not present

## 2023-03-02 DIAGNOSIS — H2513 Age-related nuclear cataract, bilateral: Secondary | ICD-10-CM | POA: Diagnosis not present

## 2023-03-02 DIAGNOSIS — H40013 Open angle with borderline findings, low risk, bilateral: Secondary | ICD-10-CM | POA: Diagnosis not present

## 2023-03-22 ENCOUNTER — Other Ambulatory Visit (HOSPITAL_COMMUNITY): Payer: Self-pay

## 2023-03-29 ENCOUNTER — Encounter: Payer: BC Managed Care – PPO | Admitting: Internal Medicine

## 2023-03-29 ENCOUNTER — Ambulatory Visit (INDEPENDENT_AMBULATORY_CARE_PROVIDER_SITE_OTHER): Payer: BC Managed Care – PPO | Admitting: Internal Medicine

## 2023-03-29 ENCOUNTER — Encounter: Payer: Self-pay | Admitting: Internal Medicine

## 2023-03-29 VITALS — BP 126/78 | HR 65 | Temp 98.4°F | Resp 16 | Ht 63.0 in | Wt 153.0 lb

## 2023-03-29 DIAGNOSIS — E781 Pure hyperglyceridemia: Secondary | ICD-10-CM

## 2023-03-29 DIAGNOSIS — I10 Essential (primary) hypertension: Secondary | ICD-10-CM | POA: Diagnosis not present

## 2023-03-29 DIAGNOSIS — R739 Hyperglycemia, unspecified: Secondary | ICD-10-CM

## 2023-03-29 DIAGNOSIS — K7581 Nonalcoholic steatohepatitis (NASH): Secondary | ICD-10-CM

## 2023-03-29 DIAGNOSIS — R7989 Other specified abnormal findings of blood chemistry: Secondary | ICD-10-CM

## 2023-03-29 NOTE — Progress Notes (Unsigned)
Subjective:  Patient ID: Jessica Mack, female    DOB: 10-Sep-1956  Age: 67 y.o. MRN: [id number]  CC: Hyperlipidemia   HPI ELLA WARGEL presents for f/up ----  She is active and denies DOE, CP, SOB, edema.  Outpatient Medications Prior to Visit  Medication Sig Dispense Refill   baclofen (LIORESAL) 10 MG tablet Take 0.5-1 tablets (5-10 mg total) by mouth at bedtime as needed for muscle spasms. 90 each 4   clonazePAM (KLONOPIN) 1 MG tablet Take 1 tablet (1 mg total) by mouth at bedtime. 90 tablet 1   Estradiol 10 MCG TABS vaginal tablet 1 tablet vaginally twice weekly 24 tablet 3   famciclovir (FAMVIR) 500 MG tablet 3 tablets (1500mg ) at onset of fever blister symptoms 9 tablet 4   famotidine (PEPCID) 10 MG tablet Take 10 mg by mouth daily.     ketoconazole (NIZORAL) 2 % shampoo SMARTSIG:5 Milliliter(s) Topical 3 Times a Week     mometasone (ELOCON) 0.1 % ointment Apply topically twice weekly 45 g 1   Multiple Vitamins-Minerals (MULTIVITAMIN PO) Take 1 tablet by mouth daily. With calcium     omega-3 acid ethyl esters (LOVAZA) 1 g capsule TAKE TWO CAPSULES BY MOUTH TWICE DAILY 360 capsule 1   Probiotic Product (ALIGN PO) Take 1 tablet by mouth daily.     nebivolol (BYSTOLIC) 5 MG tablet TAKE 1 TABLET BY MOUTH DAILY 90 tablet 1   olmesartan (BENICAR) 5 MG tablet TAKE 2 TABLETS BY MOUTH DAILY 180 tablet 1   fluticasone (FLOVENT HFA) 110 MCG/ACT inhaler Inhale 1 puff into the lungs 2 (two) times daily. 3 each 1   No facility-administered medications prior to visit.    ROS Review of Systems  Constitutional: Negative.  Negative for diaphoresis and fatigue.  HENT: Negative.    Eyes: Negative.   Respiratory: Negative.  Negative for cough, chest tightness, shortness of breath and wheezing.   Cardiovascular:  Negative for chest pain, palpitations and leg swelling.  Gastrointestinal:  Negative for abdominal pain, constipation, diarrhea, nausea and vomiting.  Endocrine: Negative.    Genitourinary: Negative.   Musculoskeletal: Negative.  Negative for arthralgias and myalgias.  Skin: Negative.   Neurological:  Negative for dizziness, weakness and light-headedness.  Psychiatric/Behavioral:  Negative for confusion, decreased concentration and sleep disturbance. The patient is nervous/anxious.     Objective:  BP 126/78 (BP Location: Left Arm, Patient Position: Sitting, Cuff Size: Large)   Pulse 65   Temp 98.4 F (36.9 C) (Oral)   Resp 16   Ht 5\' 3"  (1.6 m)   Wt 153 lb (69.4 kg)   LMP 06/26/2004   SpO2 98%   BMI 27.10 kg/m   BP Readings from Last 3 Encounters:  03/29/23 126/78  09/28/22 134/82  07/06/22 124/74    Wt Readings from Last 3 Encounters:  03/29/23 153 lb (69.4 kg)  09/28/22 159 lb (72.1 kg)  07/06/22 155 lb 6.4 oz (70.5 kg)    Physical Exam Vitals reviewed.  Constitutional:      Appearance: She is not ill-appearing.  HENT:     Nose: Nose normal.     Mouth/Throat:     Mouth: Mucous membranes are moist.  Eyes:     General: No scleral icterus.    Conjunctiva/sclera: Conjunctivae normal.  Cardiovascular:     Rate and Rhythm: Normal rate and regular rhythm.     Heart sounds: No murmur heard. Pulmonary:     Effort: Pulmonary effort is normal.  Breath sounds: No stridor. No wheezing, rhonchi or rales.  Abdominal:     General: Abdomen is flat.     Palpations: There is no mass.     Tenderness: There is no abdominal tenderness. There is no guarding.     Hernia: No hernia is present.  Musculoskeletal:        General: Normal range of motion.     Cervical back: Neck supple.     Right lower leg: No edema.     Left lower leg: No edema.  Lymphadenopathy:     Cervical: No cervical adenopathy.  Skin:    General: Skin is warm and dry.  Neurological:     General: No focal deficit present.     Mental Status: She is alert.  Psychiatric:        Mood and Affect: Mood normal.        Behavior: Behavior normal.     Lab Results  Component  Value Date   WBC 4.3 04/02/2023   HGB 13.1 04/02/2023   HCT 39.1 04/02/2023   PLT 217.0 04/02/2023   GLUCOSE 86 04/02/2023   CHOL 224 (H) 04/02/2023   TRIG 150.0 (H) 04/02/2023   HDL 45.50 04/02/2023   LDLDIRECT 116.0 05/31/2020   LDLCALC 149 (H) 04/02/2023   ALT 45 (H) 04/02/2023   AST 36 04/02/2023   NA 140 04/02/2023   K 3.9 04/02/2023   CL 105 04/02/2023   CREATININE 0.76 04/02/2023   BUN 28 (H) 04/02/2023   CO2 25 04/02/2023   TSH 2.14 02/10/2022   INR 1.1 (H) 04/02/2023   HGBA1C 5.5 04/02/2023    MM 3D SCREEN BREAST BILATERAL  Result Date: 10/01/2022 CLINICAL DATA:  Screening. LEFT lumpectomy with chemotherapy and radiation 2005. EXAM: DIGITAL SCREENING BILATERAL MAMMOGRAM WITH TOMOSYNTHESIS AND CAD TECHNIQUE: Bilateral screening digital craniocaudal and mediolateral oblique mammograms were obtained. Bilateral screening digital breast tomosynthesis was performed. The images were evaluated with computer-aided detection. COMPARISON:  Previous exam(s). ACR Breast Density Category b: There are scattered areas of fibroglandular density. FINDINGS: There are no findings suspicious for malignancy. IMPRESSION: No mammographic evidence of malignancy. A result letter of this screening mammogram will be mailed directly to the patient. RECOMMENDATION: Screening mammogram in one year. (Code:SM-B-01Y) BI-RADS CATEGORY  1: Negative. Electronically Signed   By: Norva Pavlov M.D.   On: 10/01/2022 09:17    Assessment & Plan:  NASH (nonalcoholic steatohepatitis)- Her LFT's have improved. MELD score is 8. -     Insulin, random; Future -     Hepatic function panel; Future -     Protime-INR; Future  Chronic hyperglycemia -     Insulin, random; Future -     Basic metabolic panel; Future -     Hemoglobin A1c; Future  Essential hypertension- Her BP is well controlled. -     Basic metabolic panel; Future -     CBC with Differential/Platelet; Future -     Hepatic function panel; Future -      Nebivolol HCl; TAKE 1 TABLET BY MOUTH DAILY  Dispense: 90 tablet; Refill: 1 -     Olmesartan Medoxomil; Take 2 tablets (10 mg total) by mouth daily.  Dispense: 180 tablet; Refill: 1  Hypertriglyceridemia - Improvement noted -     Lipid panel; Future -     Hepatic function panel; Future  Elevated ferritin level     Follow-up: No follow-ups on file.  Sanda Linger, MD

## 2023-03-30 DIAGNOSIS — Z8639 Personal history of other endocrine, nutritional and metabolic disease: Secondary | ICD-10-CM | POA: Diagnosis not present

## 2023-03-30 DIAGNOSIS — K7581 Nonalcoholic steatohepatitis (NASH): Secondary | ICD-10-CM | POA: Diagnosis not present

## 2023-03-30 DIAGNOSIS — E782 Mixed hyperlipidemia: Secondary | ICD-10-CM | POA: Diagnosis not present

## 2023-04-02 ENCOUNTER — Encounter: Payer: Self-pay | Admitting: Internal Medicine

## 2023-04-02 ENCOUNTER — Other Ambulatory Visit (INDEPENDENT_AMBULATORY_CARE_PROVIDER_SITE_OTHER): Payer: BC Managed Care – PPO

## 2023-04-02 DIAGNOSIS — K7581 Nonalcoholic steatohepatitis (NASH): Secondary | ICD-10-CM

## 2023-04-02 DIAGNOSIS — R739 Hyperglycemia, unspecified: Secondary | ICD-10-CM

## 2023-04-02 DIAGNOSIS — E781 Pure hyperglyceridemia: Secondary | ICD-10-CM | POA: Diagnosis not present

## 2023-04-02 DIAGNOSIS — I10 Essential (primary) hypertension: Secondary | ICD-10-CM | POA: Diagnosis not present

## 2023-04-02 LAB — CBC WITH DIFFERENTIAL/PLATELET
Basophils Absolute: 0.1 10*3/uL (ref 0.0–0.1)
Basophils Relative: 1.3 % (ref 0.0–3.0)
Eosinophils Absolute: 0.2 10*3/uL (ref 0.0–0.7)
Eosinophils Relative: 4.9 % (ref 0.0–5.0)
HCT: 39.1 % (ref 36.0–46.0)
Hemoglobin: 13.1 g/dL (ref 12.0–15.0)
Lymphocytes Relative: 35.5 % (ref 12.0–46.0)
Lymphs Abs: 1.5 10*3/uL (ref 0.7–4.0)
MCHC: 33.5 g/dL (ref 30.0–36.0)
MCV: 93.7 fl (ref 78.0–100.0)
Monocytes Absolute: 0.5 10*3/uL (ref 0.1–1.0)
Monocytes Relative: 12.3 % — ABNORMAL HIGH (ref 3.0–12.0)
Neutro Abs: 2 10*3/uL (ref 1.4–7.7)
Neutrophils Relative %: 46 % (ref 43.0–77.0)
Platelets: 217 10*3/uL (ref 150.0–400.0)
RBC: 4.17 Mil/uL (ref 3.87–5.11)
RDW: 13 % (ref 11.5–15.5)
WBC: 4.3 10*3/uL (ref 4.0–10.5)

## 2023-04-02 LAB — LIPID PANEL
Cholesterol: 224 mg/dL — ABNORMAL HIGH (ref 0–200)
HDL: 45.5 mg/dL (ref >39.00)
LDL Cholesterol: 149 mg/dL — ABNORMAL HIGH (ref 0–99)
NonHDL: 178.93
Total CHOL/HDL Ratio: 5
Triglycerides: 150 mg/dL — ABNORMAL HIGH (ref 0.0–149.0)
VLDL: 30 mg/dL (ref 0.0–40.0)

## 2023-04-02 LAB — HEPATIC FUNCTION PANEL
ALT: 45 U/L — ABNORMAL HIGH (ref 0–35)
AST: 36 U/L (ref 0–37)
Albumin: 4.7 g/dL (ref 3.5–5.2)
Alkaline Phosphatase: 60 U/L (ref 39–117)
Bilirubin, Direct: 0.1 mg/dL (ref 0.0–0.3)
Total Bilirubin: 0.5 mg/dL (ref 0.2–1.2)
Total Protein: 7.2 g/dL (ref 6.0–8.3)

## 2023-04-02 LAB — BASIC METABOLIC PANEL
BUN: 28 mg/dL — ABNORMAL HIGH (ref 6–23)
CO2: 25 mEq/L (ref 19–32)
Calcium: 9.2 mg/dL (ref 8.4–10.5)
Chloride: 105 mEq/L (ref 96–112)
Creatinine, Ser: 0.76 mg/dL (ref 0.40–1.20)
GFR: 82.63 mL/min (ref >60.00)
Glucose, Bld: 86 mg/dL (ref 70–99)
Potassium: 3.9 mEq/L (ref 3.5–5.1)
Sodium: 140 mEq/L (ref 135–145)

## 2023-04-02 LAB — PROTIME-INR
INR: 1.1 ratio — ABNORMAL HIGH (ref 0.8–1.0)
Prothrombin Time: 11.3 s (ref 9.6–13.1)

## 2023-04-02 LAB — HEMOGLOBIN A1C: Hgb A1c MFr Bld: 5.5 % (ref 4.6–6.5)

## 2023-04-05 ENCOUNTER — Encounter (HOSPITAL_BASED_OUTPATIENT_CLINIC_OR_DEPARTMENT_OTHER): Payer: Self-pay | Admitting: Student

## 2023-04-05 ENCOUNTER — Ambulatory Visit (INDEPENDENT_AMBULATORY_CARE_PROVIDER_SITE_OTHER): Payer: BC Managed Care – PPO | Admitting: Student

## 2023-04-05 ENCOUNTER — Ambulatory Visit (HOSPITAL_BASED_OUTPATIENT_CLINIC_OR_DEPARTMENT_OTHER): Payer: BLUE CROSS/BLUE SHIELD

## 2023-04-05 ENCOUNTER — Other Ambulatory Visit (HOSPITAL_BASED_OUTPATIENT_CLINIC_OR_DEPARTMENT_OTHER): Payer: Self-pay | Admitting: Student

## 2023-04-05 ENCOUNTER — Other Ambulatory Visit: Payer: Self-pay | Admitting: Internal Medicine

## 2023-04-05 DIAGNOSIS — M7022 Olecranon bursitis, left elbow: Secondary | ICD-10-CM

## 2023-04-05 DIAGNOSIS — M25522 Pain in left elbow: Secondary | ICD-10-CM

## 2023-04-05 DIAGNOSIS — M898X1 Other specified disorders of bone, shoulder: Secondary | ICD-10-CM | POA: Diagnosis not present

## 2023-04-05 DIAGNOSIS — R7989 Other specified abnormal findings of blood chemistry: Secondary | ICD-10-CM

## 2023-04-05 LAB — INSULIN, RANDOM: Insulin: 21.2 u[IU]/mL — ABNORMAL HIGH

## 2023-04-05 MED ORDER — NEBIVOLOL HCL 5 MG PO TABS
ORAL_TABLET | ORAL | 1 refills | Status: DC
Start: 2023-04-05 — End: 2023-11-25

## 2023-04-05 MED ORDER — OLMESARTAN MEDOXOMIL 5 MG PO TABS
10.0000 mg | ORAL_TABLET | Freq: Every day | ORAL | 1 refills | Status: DC
Start: 2023-04-05 — End: 2024-03-07

## 2023-04-05 NOTE — Progress Notes (Signed)
Chief Complaint: Left elbow pain and swelling     History of Present Illness:    Jessica Mack is a 67 y.o. female presenting today for evaluation of pain and swelling in the left elbow.  She does report that she sustained a fall 2 weeks ago during which she rolled on the ground and scraped her elbow, however she denies any direct trauma or immediate pain and swelling as a result.  Patient states that she woke up yesterday after no new injury with pain, swelling, and redness of the elbow.  Denies any fever or chills.  She has tried an Emergency planning/management officer as well as Aleve for pain and inflammation control.  She does also note some pain in her left scapula which she believes is likely also a result of the fall.  No significant deficits in range of motion but it does still continue to cause discomfort.  No numbness or tingling in the arm or hand.   Surgical History:   None  PMH/PSH/Family History/Social History/Meds/Allergies:    Past Medical History:  Diagnosis Date   Avascular necrosis of bone (HCC)    Left talus   Blood transfusion    Breast cancer (HCC) 2005   left   Cancer (HCC)    Dysplastic nevus 1988   right hip   Fatty liver    GERD (gastroesophageal reflux disease)    History of breast cancer 2005   invasive ductal cancer left breast   Hypertension    Hypertriglyceridemia    Irritable bowel syndrome    Metabolic syndrome    resolved with diet and exercise   NASH (nonalcoholic steatohepatitis)    Nocturnal leg cramps 02/14/2015   Osteopenia    Palpitations 09/2012   normal echo   Peripheral neuropathy    Personal history of chemotherapy    Personal history of radiation therapy    Plantar fasciitis 03/2003   Tongue dysplasia    Ulcer    Past Surgical History:  Procedure Laterality Date   ANKLE SURGERY  07/2007   left, revascularization   BREAST LUMPECTOMY  06/2004   left, with port placement   CESAREAN SECTION  1991, 1993   x2    COLONOSCOPY  09/2013   Dr. Loreta Ave   DG GALL BLADDER     LAPAROSCOPIC CHOLECYSTECTOMY  2003   MOUTH SURGERY  11/15   implant/crown placed 4/16   SHOULDER ARTHROSCOPY  10/2005   right   SHOULDER SURGERY  12/15   left   TONGUE SURGERY  2012, 2013   WRIST SURGERY  1988   right, DeQuervains   Social History   Socioeconomic History   Marital status: Married    Spouse name: Not on file   Number of children: 2   Years of education: MD   Highest education level: Not on file  Occupational History    Employer: [employer]   Occupation: MD/Adjunct Professor  Tobacco Use   Smoking status: Never   Smokeless tobacco: Never  Vaping Use   Vaping Use: Never used  Substance and Sexual Activity   Alcohol use: Yes    Alcohol/week: 7.0 standard drinks of alcohol    Types: 7 Standard drinks or equivalent per week   Drug use: No   Sexual activity: Yes    Partners: Male  Birth control/protection: Post-menopausal    Comment: husband vasectomy  Other Topics Concern   Not on file  Social History Narrative   Lives at home w/ husband and daughter   Retired OB/GYN physician   Patient is right handed.   Patient drinks 2 cups of caffeine daily.   Social Determinants of Corporate investment banker Strain: Not on file  Food Insecurity: Not on file  Transportation Needs: Not on file  Physical Activity: Not on file  Stress: Not on file  Social Connections: Not on file   Family History  Problem Relation Age of Onset   Thyroid disease Mother    Hyperlipidemia Mother    Hypertension Mother    Diabetes Mother    Cancer Mother        melanoma   Heart disease Mother    Depression Mother    Sleep apnea Mother    Obesity Mother    Heart disease Father    Hyperlipidemia Father    Hypertension Father    Cancer Father        colon   Prostate cancer Father    Kidney disease Father    Asthma Sister    Asthma Brother    Breast cancer Neg Hx    Allergies  Allergen Reactions    Augmentin [Amoxicillin-Pot Clavulanate]     abd cramping   Cephalexin     Profuse diarrhea   Crestor [Rosuvastatin]     Liver function elevated    Current Outpatient Medications  Medication Sig Dispense Refill   baclofen (LIORESAL) 10 MG tablet Take 0.5-1 tablets (5-10 mg total) by mouth at bedtime as needed for muscle spasms. 90 each 4   clonazePAM (KLONOPIN) 1 MG tablet Take 1 tablet (1 mg total) by mouth at bedtime. 90 tablet 1   Estradiol 10 MCG TABS vaginal tablet 1 tablet vaginally twice weekly 24 tablet 3   famciclovir (FAMVIR) 500 MG tablet 3 tablets (1500mg ) at onset of fever blister symptoms 9 tablet 4   famotidine (PEPCID) 10 MG tablet Take 10 mg by mouth daily.     ketoconazole (NIZORAL) 2 % shampoo SMARTSIG:5 Milliliter(s) Topical 3 Times a Week     mometasone (ELOCON) 0.1 % ointment Apply topically twice weekly 45 g 1   Multiple Vitamins-Minerals (MULTIVITAMIN PO) Take 1 tablet by mouth daily. With calcium     nebivolol (BYSTOLIC) 5 MG tablet TAKE 1 TABLET BY MOUTH DAILY 90 tablet 1   olmesartan (BENICAR) 5 MG tablet TAKE 2 TABLETS BY MOUTH DAILY 180 tablet 1   omega-3 acid ethyl esters (LOVAZA) 1 g capsule TAKE TWO CAPSULES BY MOUTH TWICE DAILY 360 capsule 1   Probiotic Product (ALIGN PO) Take 1 tablet by mouth daily.     No current facility-administered medications for this visit.   No results found.  Review of Systems:   A ROS was performed including pertinent positives and negatives as documented in the HPI.  Physical Exam :   Constitutional: NAD and appears stated age Neurological: Alert and oriented Psych: Appropriate affect and cooperative Last menstrual period 06/26/2004.   Comprehensive Musculoskeletal Exam:    Left elbow active range of motion 0 to 120 degrees.  There is some mild swelling and erythema noted over the olecranon with two small abrasions that are crusted over without drainage.  Slight warmth and tenderness to palpation directly over  olecranon.  Some tenderness noted over left scapular spine.  Normal active range of motion of bilateral shoulders to  160 degrees forward flexion, 50 degrees external rotation, and internal rotation to L3.  Negative Hawkins, empty can, belly press, and O'Brien's testing.  Imaging:   Xray (Left elbow 2 views): Negative for fracture or dislocation. Mild soft tissue edema.   I personally reviewed and interpreted the radiographs.   Assessment:   67 y.o. female with left elbow pain and swelling consistent with olecranon bursitis.  At this point it is difficult to distinguish between a noninfectious versus developing septic bursitis.  There is not a significant fluid collection in the elbow today, however if this develops then will consider aspiration with fluid analysis.  At this point I recommend proceeding with conservative therapy including rest, ice, and anti-inflammatories.  Discussed with patient that should redness or swelling worsen then I would likely start her on a course of antibiotics.  She reports that she does not tolerate cephalosporins and Augmentin due to GI upset.  I will plan on seeing her back on an as-needed basis, particularly should significant swelling or symptoms remain persistent.  In regards to her scapular pain, I do believe that this is likely muscular in nature and should this continue or worsen would consider further evaluation with x-ray and/or referral to physical therapy.  Plan :    -Return to clinic as needed     I personally saw and evaluated the patient, and participated in the management and treatment plan.  Hazle Nordmann, PA-C Orthopedics  This document was dictated using Conservation officer, historic buildings. A reasonable attempt at proof reading has been made to minimize errors.

## 2023-04-06 DIAGNOSIS — F4323 Adjustment disorder with mixed anxiety and depressed mood: Secondary | ICD-10-CM | POA: Diagnosis not present

## 2023-04-08 ENCOUNTER — Telehealth: Payer: Self-pay | Admitting: Pharmacy Technician

## 2023-04-08 NOTE — Telephone Encounter (Signed)
Pharmacy Patient Advocate Encounter   Received notification from CoverMyMeds that prior authorization for Nebivolol HCl 5MG  tablets is required/requested.     PA submitted to [facility] via PA options: CoverMyMeds Key/confirmation #/EOC [id number] Status is pending

## 2023-04-11 NOTE — Telephone Encounter (Signed)
Patient Advocate Encounter  Received a fax from [employer] [place] regarding Prior Authorization for Nebivolol HCl 5MG  tablets.   Key: [id number]  Authorization has been DENIED due to    Determination letter attached to patient chart

## 2023-04-12 ENCOUNTER — Encounter (HOSPITAL_BASED_OUTPATIENT_CLINIC_OR_DEPARTMENT_OTHER): Payer: Self-pay

## 2023-04-12 ENCOUNTER — Other Ambulatory Visit (HOSPITAL_BASED_OUTPATIENT_CLINIC_OR_DEPARTMENT_OTHER): Payer: Self-pay | Admitting: Student

## 2023-04-12 MED ORDER — SULFAMETHOXAZOLE-TRIMETHOPRIM 800-160 MG PO TABS: 1.0000 | ORAL_TABLET | Freq: Two times a day (BID) | ORAL | 0 refills | Status: AC

## 2023-04-12 NOTE — Telephone Encounter (Signed)
Spoke with pt over the phone concerning symptoms. I will send her in Bactrim PO and plan to see her in clinic later this week.

## 2023-04-15 ENCOUNTER — Telehealth: Payer: Self-pay

## 2023-04-15 ENCOUNTER — Ambulatory Visit (INDEPENDENT_AMBULATORY_CARE_PROVIDER_SITE_OTHER): Payer: BC Managed Care – PPO | Admitting: Student

## 2023-04-15 ENCOUNTER — Encounter (HOSPITAL_BASED_OUTPATIENT_CLINIC_OR_DEPARTMENT_OTHER): Payer: Self-pay | Admitting: Student

## 2023-04-15 DIAGNOSIS — M25522 Pain in left elbow: Secondary | ICD-10-CM

## 2023-04-15 DIAGNOSIS — M7022 Olecranon bursitis, left elbow: Secondary | ICD-10-CM

## 2023-04-15 MED ORDER — LIDOCAINE HCL 1 % IJ SOLN
1.0000 mL | INTRAMUSCULAR | Status: AC | PRN
Start: 2023-04-15 — End: 2023-04-15
  Administered 2023-04-15: 1 mL

## 2023-04-15 NOTE — Telephone Encounter (Signed)
Please advise 

## 2023-04-15 NOTE — Telephone Encounter (Signed)
Patient LMVM on the triage line stating she was supposed to make an appointment with Jean Rosenthal on Monday but she is unable to make this work for her schedule. She is asking for a call back [phone] to see when is another time she could come? She stated you guys had mentioned maybe a time when Dr Steward Drone was in the office. I would have called her to schedule but was not sure when you wanted her to come back. Please call to advise. [phone]

## 2023-04-15 NOTE — Progress Notes (Signed)
Chief Complaint: Left elbow pain and swelling     History of Present Illness:   04/15/23: Patient is returning to clinic today with continued pain and redness over the left elbow.  She has been on Bactrim DS for 3 and half days with no improvement noted since beginning.  She continues to note tightness and pain over the elbow particularly with range of motion such as being able to close the car door.  Has continued taking Aleve every 8 hours in addition to the antibiotic.  Denies any fever, chills, or other systemic symptoms.    Jessica Mack is a 67 y.o. female presenting today for evaluation of pain and swelling in the left elbow.  She does report that she sustained a fall 2 weeks ago during which she rolled on the ground and scraped her elbow, however she denies any direct trauma or immediate pain and swelling as a result.  Patient states that she woke up yesterday after no new injury with pain, swelling, and redness of the elbow.  Denies any fever or chills.  She has tried an Emergency planning/management officer as well as Aleve for pain and inflammation control.  She does also note some pain in her left scapula which she believes is likely also a result of the fall.  No significant deficits in range of motion but it does still continue to cause discomfort.  No numbness or tingling in the arm or hand.   Surgical History:   None  PMH/PSH/Family History/Social History/Meds/Allergies:    Past Medical History:  Diagnosis Date   Avascular necrosis of bone (HCC)    Left talus   Blood transfusion    Breast cancer (HCC) 2005   left   Cancer (HCC)    Dysplastic nevus 1988   right hip   Fatty liver    GERD (gastroesophageal reflux disease)    History of breast cancer 2005   invasive ductal cancer left breast   Hypertension    Hypertriglyceridemia    Irritable bowel syndrome    Metabolic syndrome    resolved with diet and exercise   NASH (nonalcoholic steatohepatitis)     Nocturnal leg cramps 02/14/2015   Osteopenia    Palpitations 09/2012   normal echo   Peripheral neuropathy    Personal history of chemotherapy    Personal history of radiation therapy    Plantar fasciitis 03/2003   Tongue dysplasia    Ulcer    Past Surgical History:  Procedure Laterality Date   ANKLE SURGERY  07/2007   left, revascularization   BREAST LUMPECTOMY  06/2004   left, with port placement   CESAREAN SECTION  1991, 1993   x2   COLONOSCOPY  09/2013   Dr. Loreta Ave   DG GALL BLADDER     LAPAROSCOPIC CHOLECYSTECTOMY  2003   MOUTH SURGERY  11/15   implant/crown placed 4/16   SHOULDER ARTHROSCOPY  10/2005   right   SHOULDER SURGERY  12/15   left   TONGUE SURGERY  2012, 2013   WRIST SURGERY  1988   right, DeQuervains   Social History   Socioeconomic History   Marital status: Married    Spouse name: Not on file   Number of children: 2   Years of education: MD   Highest education level: Not on file  Occupational History    Employer: [employer]   Occupation: MD/Adjunct Professor  Tobacco Use   Smoking status: Never   Smokeless tobacco: Never  Vaping Use   Vaping Use: Never used  Substance and Sexual Activity   Alcohol use: Yes    Alcohol/week: 7.0 standard drinks of alcohol    Types: 7 Standard drinks or equivalent per week   Drug use: No   Sexual activity: Yes    Partners: Male    Birth control/protection: Post-menopausal    Comment: husband vasectomy  Other Topics Concern   Not on file  Social History Narrative   Lives at home w/ husband and daughter   Retired OB/GYN physician   Patient is right handed.   Patient drinks 2 cups of caffeine daily.   Social Determinants of Corporate investment banker Strain: Not on file  Food Insecurity: Not on file  Transportation Needs: Not on file  Physical Activity: Not on file  Stress: Not on file  Social Connections: Not on file   Family History  Problem Relation Age of Onset   Thyroid disease Mother     Hyperlipidemia Mother    Hypertension Mother    Diabetes Mother    Cancer Mother        melanoma   Heart disease Mother    Depression Mother    Sleep apnea Mother    Obesity Mother    Heart disease Father    Hyperlipidemia Father    Hypertension Father    Cancer Father        colon   Prostate cancer Father    Kidney disease Father    Asthma Sister    Asthma Brother    Breast cancer Neg Hx    Allergies  Allergen Reactions   Augmentin [Amoxicillin-Pot Clavulanate]     abd cramping   Cephalexin     Profuse diarrhea   Crestor [Rosuvastatin]     Liver function elevated    Current Outpatient Medications  Medication Sig Dispense Refill   sulfamethoxazole-trimethoprim (BACTRIM DS) 800-160 MG tablet Take 1 tablet by mouth 2 (two) times daily for 7 days. 14 tablet 0   baclofen (LIORESAL) 10 MG tablet Take 0.5-1 tablets (5-10 mg total) by mouth at bedtime as needed for muscle spasms. 90 each 4   clonazePAM (KLONOPIN) 1 MG tablet Take 1 tablet (1 mg total) by mouth at bedtime. 90 tablet 1   Estradiol 10 MCG TABS vaginal tablet 1 tablet vaginally twice weekly 24 tablet 3   famciclovir (FAMVIR) 500 MG tablet 3 tablets (1500mg ) at onset of fever blister symptoms 9 tablet 4   famotidine (PEPCID) 10 MG tablet Take 10 mg by mouth daily.     ketoconazole (NIZORAL) 2 % shampoo SMARTSIG:5 Milliliter(s) Topical 3 Times a Week     mometasone (ELOCON) 0.1 % ointment Apply topically twice weekly 45 g 1   Multiple Vitamins-Minerals (MULTIVITAMIN PO) Take 1 tablet by mouth daily. With calcium     nebivolol (BYSTOLIC) 5 MG tablet TAKE 1 TABLET BY MOUTH DAILY 90 tablet 1   olmesartan (BENICAR) 5 MG tablet Take 2 tablets (10 mg total) by mouth daily. 180 tablet 1   omega-3 acid ethyl esters (LOVAZA) 1 g capsule TAKE TWO CAPSULES BY MOUTH TWICE DAILY 360 capsule 1   Probiotic Product (ALIGN PO) Take 1 tablet by mouth daily.     No current facility-administered medications for this visit.   No  results found.  Review of Systems:  A ROS was performed including pertinent positives and negatives as documented in the HPI.  Physical Exam :   Constitutional: NAD and appears stated age Neurological: Alert and oriented Psych: Appropriate affect and cooperative Last menstrual period 06/26/2004.   Comprehensive Musculoskeletal Exam:    Full range of motion of the elbow with increased pain noted at terminal range of motion.  Well-circumscribed swelling over the olecranon that is erythematous and firm.  Palpation over this area is very tender and warm to the touch.  No significant fluctuance or fluid collection.  No skin compromise.  Imaging:     Assessment:   67 y.o. female with continued left elbow redness, pain, and swelling.  She is halfway through her course of Bactrim and has not seen any improvement.  Elbow does appear slightly more erythematous and swollen since last visit.  I believe that this is likely a result of septic bursitis although does have the appearance of an abscess.  At this point I would recommend aspiration for symptom relief and evaluation of fluid.  Approximately 4 cc of purulent bloody fluid was aspirated and sent to lab for culture.  At this point I would like for her to finish current course of antibiotics and continue monitoring symptoms.  We will plan to have her return to clinic next week for reevaluation and can assess for any needed medication changes pending culture results.   Plan :    -Send aspirate for culture -Return to clinic in 1 week for reassessment, sooner if needed     Procedure Note  Patient: Jessica Mack             Date of Birth: 1956-07-20           MRN: [id number]             Visit Date: 04/15/2023  Procedures: Visit Diagnoses:  1. Olecranon bursitis of left elbow     Medium Joint Inj: L olecranon bursa on 04/15/2023 4:17 PM Indications: pain and joint swelling Details: 18 G 1.5 in needle, posterior approach Medications: 1 mL  lidocaine 1 % Aspirate: 4 mL bloody and purulent; sent for lab analysis Outcome: tolerated well, no immediate complications Procedure, treatment alternatives, risks and benefits explained, specific risks discussed. Consent was given by the patient. Patient was prepped and draped in the usual sterile fashion.      I personally saw and evaluated the patient, and participated in the management and treatment plan.  Hazle Nordmann, PA-C Orthopedics  This document was dictated using Conservation officer, historic buildings. A reasonable attempt at proof reading has been made to minimize errors.

## 2023-04-15 NOTE — Telephone Encounter (Signed)
Per Jean Rosenthal. Ok for wed. Called and made appt.

## 2023-04-20 DIAGNOSIS — F4323 Adjustment disorder with mixed anxiety and depressed mood: Secondary | ICD-10-CM | POA: Diagnosis not present

## 2023-04-21 ENCOUNTER — Ambulatory Visit (HOSPITAL_BASED_OUTPATIENT_CLINIC_OR_DEPARTMENT_OTHER): Payer: BLUE CROSS/BLUE SHIELD | Admitting: Orthopaedic Surgery

## 2023-04-21 ENCOUNTER — Ambulatory Visit (HOSPITAL_BASED_OUTPATIENT_CLINIC_OR_DEPARTMENT_OTHER): Payer: BC Managed Care – PPO | Admitting: Orthopaedic Surgery

## 2023-04-21 DIAGNOSIS — M7022 Olecranon bursitis, left elbow: Secondary | ICD-10-CM | POA: Diagnosis not present

## 2023-04-21 MED ORDER — SULFAMETHOXAZOLE-TRIMETHOPRIM 800-160 MG PO TABS: 1.0000 | ORAL_TABLET | Freq: Two times a day (BID) | ORAL | 0 refills | Status: AC

## 2023-04-21 NOTE — Progress Notes (Signed)
Chief Complaint: Left elbow bursitis     History of Present Illness:    Jessica Mack is a 67 y.o. female presents with ongoing left infectious elbow bursitis for follow-up.  She has been seeing Hazle Nordmann, PA who placed her on Bactrim antibiotics.  She is still having pain and tenderness about the tip of the olecranon with some redness.  Denies any drainage from this.  Denies any fevers or chills.    Surgical History:   None  PMH/PSH/Family History/Social History/Meds/Allergies:    Past Medical History:  Diagnosis Date   Avascular necrosis of bone (HCC)    Left talus   Blood transfusion    Breast cancer (HCC) 2005   left   Cancer (HCC)    Dysplastic nevus 1988   right hip   Fatty liver    GERD (gastroesophageal reflux disease)    History of breast cancer 2005   invasive ductal cancer left breast   Hypertension    Hypertriglyceridemia    Irritable bowel syndrome    Metabolic syndrome    resolved with diet and exercise   NASH (nonalcoholic steatohepatitis)    Nocturnal leg cramps 02/14/2015   Osteopenia    Palpitations 09/2012   normal echo   Peripheral neuropathy    Personal history of chemotherapy    Personal history of radiation therapy    Plantar fasciitis 03/2003   Tongue dysplasia    Ulcer    Past Surgical History:  Procedure Laterality Date   ANKLE SURGERY  07/2007   left, revascularization   BREAST LUMPECTOMY  06/2004   left, with port placement   CESAREAN SECTION  1991, 1993   x2   COLONOSCOPY  09/2013   Dr. Loreta Ave   DG GALL BLADDER     LAPAROSCOPIC CHOLECYSTECTOMY  2003   MOUTH SURGERY  11/15   implant/crown placed 4/16   SHOULDER ARTHROSCOPY  10/2005   right   SHOULDER SURGERY  12/15   left   TONGUE SURGERY  2012, 2013   WRIST SURGERY  1988   right, DeQuervains   Social History   Socioeconomic History   Marital status: Married    Spouse name: Not on file   Number of children: 2   Years of  education: MD   Highest education level: Not on file  Occupational History    Employer: [employer]   Occupation: MD/Adjunct Professor  Tobacco Use   Smoking status: Never   Smokeless tobacco: Never  Vaping Use   Vaping Use: Never used  Substance and Sexual Activity   Alcohol use: Yes    Alcohol/week: 7.0 standard drinks of alcohol    Types: 7 Standard drinks or equivalent per week   Drug use: No   Sexual activity: Yes    Partners: Male    Birth control/protection: Post-menopausal    Comment: husband vasectomy  Other Topics Concern   Not on file  Social History Narrative   Lives at home w/ husband and daughter   Retired OB/GYN physician   Patient is right handed.   Patient drinks 2 cups of caffeine daily.   Social Determinants of Health   Financial Resource Strain: Not on file  Food Insecurity: Not on file  Transportation Needs: Not on file  Physical Activity: Not on file  Stress: Not on  file  Social Connections: Not on file   Family History  Problem Relation Age of Onset   Thyroid disease Mother    Hyperlipidemia Mother    Hypertension Mother    Diabetes Mother    Cancer Mother        melanoma   Heart disease Mother    Depression Mother    Sleep apnea Mother    Obesity Mother    Heart disease Father    Hyperlipidemia Father    Hypertension Father    Cancer Father        colon   Prostate cancer Father    Kidney disease Father    Asthma Sister    Asthma Brother    Breast cancer Neg Hx    Allergies  Allergen Reactions   Augmentin [Amoxicillin-Pot Clavulanate]     abd cramping   Cephalexin     Profuse diarrhea   Crestor [Rosuvastatin]     Liver function elevated    Current Outpatient Medications  Medication Sig Dispense Refill   sulfamethoxazole-trimethoprim (BACTRIM DS) 800-160 MG tablet Take 1 tablet by mouth 2 (two) times daily for 7 days. 14 tablet 0   baclofen (LIORESAL) 10 MG tablet Take 0.5-1 tablets (5-10 mg total) by mouth at bedtime  as needed for muscle spasms. 90 each 4   clonazePAM (KLONOPIN) 1 MG tablet Take 1 tablet (1 mg total) by mouth at bedtime. 90 tablet 1   Estradiol 10 MCG TABS vaginal tablet 1 tablet vaginally twice weekly 24 tablet 3   famciclovir (FAMVIR) 500 MG tablet 3 tablets (1500mg ) at onset of fever blister symptoms 9 tablet 4   famotidine (PEPCID) 10 MG tablet Take 10 mg by mouth daily.     ketoconazole (NIZORAL) 2 % shampoo SMARTSIG:5 Milliliter(s) Topical 3 Times a Week     mometasone (ELOCON) 0.1 % ointment Apply topically twice weekly 45 g 1   Multiple Vitamins-Minerals (MULTIVITAMIN PO) Take 1 tablet by mouth daily. With calcium     nebivolol (BYSTOLIC) 5 MG tablet TAKE 1 TABLET BY MOUTH DAILY 90 tablet 1   olmesartan (BENICAR) 5 MG tablet Take 2 tablets (10 mg total) by mouth daily. 180 tablet 1   omega-3 acid ethyl esters (LOVAZA) 1 g capsule TAKE TWO CAPSULES BY MOUTH TWICE DAILY 360 capsule 1   Probiotic Product (ALIGN PO) Take 1 tablet by mouth daily.     No current facility-administered medications for this visit.   No results found.  Review of Systems:   A ROS was performed including pertinent positives and negatives as documented in the HPI.  Physical Exam :   Constitutional: NAD and appears stated age Neurological: Alert and oriented Psych: Appropriate affect and cooperative Last menstrual period 06/26/2004.   Comprehensive Musculoskeletal Exam:    There is redness and erythema about the tip of the olecranon with evidence of infectious bursitis.  There is a mild amount of fluctuance.  This measures approximately 2 cm.  There is no surrounding induration or erythema.  Imaging:    I personally reviewed and interpreted the radiographs.   Assessment:   67 y.o. female with left elbow infectious bursitis.  At today's visit this does appear to be very superficial and as result we have provided her with topical antibiotic gel which she can apply to the area.  I would like to keep  her on 7 days additionally of Bactrim and I will plan to see her back for reassessment in 1 week.  Attempted aspiration  was made without any significant return.  Plan :    -Return to clinic in 1 week     I personally saw and evaluated the patient, and participated in the management and treatment plan.  [name], MD Attending Physician, Orthopedic Surgery  This document was dictated using Dragon voice recognition software. A reasonable attempt at proof reading has been made to minimize errors.

## 2023-04-28 ENCOUNTER — Ambulatory Visit (HOSPITAL_BASED_OUTPATIENT_CLINIC_OR_DEPARTMENT_OTHER): Payer: BC Managed Care – PPO | Admitting: Orthopaedic Surgery

## 2023-04-28 DIAGNOSIS — M7022 Olecranon bursitis, left elbow: Secondary | ICD-10-CM | POA: Diagnosis not present

## 2023-04-28 NOTE — Progress Notes (Signed)
Chief Complaint: Left elbow bursitis     History of Present Illness:   04/28/2023: Presents today for follow-up of her left elbow.  She has been applying the bacitracin ointment with limited improvement.  She is now experiencing tenderness and soreness at insertion of the triceps.   Jessica Mack is a 67 y.o. female presents with ongoing left infectious elbow bursitis for follow-up.  She has been seeing Hazle Nordmann, PA who placed her on Bactrim antibiotics.  She is still having pain and tenderness about the tip of the olecranon with some redness.  Denies any drainage from this.  Denies any fevers or chills.    Surgical History:   None  PMH/PSH/Family History/Social History/Meds/Allergies:    Past Medical History:  Diagnosis Date   Avascular necrosis of bone (HCC)    Left talus   Blood transfusion    Breast cancer (HCC) 2005   left   Cancer (HCC)    Dysplastic nevus 1988   right hip   Fatty liver    GERD (gastroesophageal reflux disease)    History of breast cancer 2005   invasive ductal cancer left breast   Hypertension    Hypertriglyceridemia    Irritable bowel syndrome    Metabolic syndrome    resolved with diet and exercise   NASH (nonalcoholic steatohepatitis)    Nocturnal leg cramps 02/14/2015   Osteopenia    Palpitations 09/2012   normal echo   Peripheral neuropathy    Personal history of chemotherapy    Personal history of radiation therapy    Plantar fasciitis 03/2003   Tongue dysplasia    Ulcer    Past Surgical History:  Procedure Laterality Date   ANKLE SURGERY  07/2007   left, revascularization   BREAST LUMPECTOMY  06/2004   left, with port placement   CESAREAN SECTION  1991, 1993   x2   COLONOSCOPY  09/2013   Dr. Loreta Ave   DG GALL BLADDER     LAPAROSCOPIC CHOLECYSTECTOMY  2003   MOUTH SURGERY  11/15   implant/crown placed 4/16   SHOULDER ARTHROSCOPY  10/2005   right   SHOULDER SURGERY  12/15   left    TONGUE SURGERY  2012, 2013   WRIST SURGERY  1988   right, DeQuervains   Social History   Socioeconomic History   Marital status: Married    Spouse name: Not on file   Number of children: 2   Years of education: MD   Highest education level: Not on file  Occupational History    Employer: [employer]   Occupation: MD/Adjunct Professor  Tobacco Use   Smoking status: Never   Smokeless tobacco: Never  Vaping Use   Vaping Use: Never used  Substance and Sexual Activity   Alcohol use: Yes    Alcohol/week: 7.0 standard drinks of alcohol    Types: 7 Standard drinks or equivalent per week   Drug use: No   Sexual activity: Yes    Partners: Male    Birth control/protection: Post-menopausal    Comment: husband vasectomy  Other Topics Concern   Not on file  Social History Narrative   Lives at home w/ husband and daughter   Retired OB/GYN physician   Patient is right handed.   Patient drinks 2 cups of caffeine daily.  Social Determinants of Corporate investment banker Strain: Not on file  Food Insecurity: Not on file  Transportation Needs: Not on file  Physical Activity: Not on file  Stress: Not on file  Social Connections: Not on file   Family History  Problem Relation Age of Onset   Thyroid disease Mother    Hyperlipidemia Mother    Hypertension Mother    Diabetes Mother    Cancer Mother        melanoma   Heart disease Mother    Depression Mother    Sleep apnea Mother    Obesity Mother    Heart disease Father    Hyperlipidemia Father    Hypertension Father    Cancer Father        colon   Prostate cancer Father    Kidney disease Father    Asthma Sister    Asthma Brother    Breast cancer Neg Hx    Allergies  Allergen Reactions   Augmentin [Amoxicillin-Pot Clavulanate]     abd cramping   Cephalexin     Profuse diarrhea   Crestor [Rosuvastatin]     Liver function elevated    Current Outpatient Medications  Medication Sig Dispense Refill   baclofen  (LIORESAL) 10 MG tablet Take 0.5-1 tablets (5-10 mg total) by mouth at bedtime as needed for muscle spasms. 90 each 4   clonazePAM (KLONOPIN) 1 MG tablet Take 1 tablet (1 mg total) by mouth at bedtime. 90 tablet 1   Estradiol 10 MCG TABS vaginal tablet 1 tablet vaginally twice weekly 24 tablet 3   famciclovir (FAMVIR) 500 MG tablet 3 tablets (1500mg ) at onset of fever blister symptoms 9 tablet 4   famotidine (PEPCID) 10 MG tablet Take 10 mg by mouth daily.     ketoconazole (NIZORAL) 2 % shampoo SMARTSIG:5 Milliliter(s) Topical 3 Times a Week     mometasone (ELOCON) 0.1 % ointment Apply topically twice weekly 45 g 1   Multiple Vitamins-Minerals (MULTIVITAMIN PO) Take 1 tablet by mouth daily. With calcium     nebivolol (BYSTOLIC) 5 MG tablet TAKE 1 TABLET BY MOUTH DAILY 90 tablet 1   olmesartan (BENICAR) 5 MG tablet Take 2 tablets (10 mg total) by mouth daily. 180 tablet 1   omega-3 acid ethyl esters (LOVAZA) 1 g capsule TAKE TWO CAPSULES BY MOUTH TWICE DAILY 360 capsule 1   Probiotic Product (ALIGN PO) Take 1 tablet by mouth daily.     sulfamethoxazole-trimethoprim (BACTRIM DS) 800-160 MG tablet Take 1 tablet by mouth 2 (two) times daily for 7 days. 14 tablet 0   No current facility-administered medications for this visit.   No results found.  Review of Systems:   A ROS was performed including pertinent positives and negatives as documented in the HPI.  Physical Exam :   Constitutional: NAD and appears stated age Neurological: Alert and oriented Psych: Appropriate affect and cooperative Last menstrual period 06/26/2004.   Comprehensive Musculoskeletal Exam:    There is redness and erythema about the tip of the olecranon with evidence of infectious bursitis.  There is a mild amount of fluctuance.  This measures approximately 2 cm.  There is no surrounding induration or erythema.  Imaging:    I personally reviewed and interpreted the radiographs.   Assessment:   67 y.o. female with  left elbow infectious bursitis.  Culture was positive for Staph aureus.  Given the fact that she is not improving on the Bactrim at this time I believe  an MRI is needed in order to rule out any type of underlying osteomyelitis.  Will plan to proceed with this and I will see her back following this we can discuss results  Plan :    -Plan for left elbow follow-up discuss results     I personally saw and evaluated the patient, and participated in the management and treatment plan.  [name], MD Attending Physician, Orthopedic Surgery  This document was dictated using Dragon voice recognition software. A reasonable attempt at proof reading has been made to minimize errors.

## 2023-04-28 NOTE — H&P (View-Only) (Signed)
                               Chief Complaint: Left elbow bursitis     History of Present Illness:   04/28/2023: Presents today for follow-up of her left elbow.  She has been applying the bacitracin ointment with limited improvement.  She is now experiencing tenderness and soreness at insertion of the triceps.   Jessica Mack is a 66 y.o. female presents with ongoing left infectious elbow bursitis for follow-up.  She has been seeing Jackson Overturf, PA who placed her on Bactrim antibiotics.  She is still having pain and tenderness about the tip of the olecranon with some redness.  Denies any drainage from this.  Denies any fevers or chills.    Surgical History:   None  PMH/PSH/Family History/Social History/Meds/Allergies:    Past Medical History:  Diagnosis Date   Avascular necrosis of bone (HCC)    Left talus   Blood transfusion    Breast cancer (HCC) 2005   left   Cancer (HCC)    Dysplastic nevus 1988   right hip   Fatty liver    GERD (gastroesophageal reflux disease)    History of breast cancer 2005   invasive ductal cancer left breast   Hypertension    Hypertriglyceridemia    Irritable bowel syndrome    Metabolic syndrome    resolved with diet and exercise   NASH (nonalcoholic steatohepatitis)    Nocturnal leg cramps 02/14/2015   Osteopenia    Palpitations 09/2012   normal echo   Peripheral neuropathy    Personal history of chemotherapy    Personal history of radiation therapy    Plantar fasciitis 03/2003   Tongue dysplasia    Ulcer    Past Surgical History:  Procedure Laterality Date   ANKLE SURGERY  07/2007   left, revascularization   BREAST LUMPECTOMY  06/2004   left, with port placement   CESAREAN SECTION  1991, 1993   x2   COLONOSCOPY  09/2013   Dr. Mann   DG GALL BLADDER     LAPAROSCOPIC CHOLECYSTECTOMY  2003   MOUTH SURGERY  11/15   implant/crown placed 4/16   SHOULDER ARTHROSCOPY  10/2005   right   SHOULDER SURGERY  12/15   left    TONGUE SURGERY  2012, 2013   WRIST SURGERY  1988   right, DeQuervains   Social History   Socioeconomic History   Marital status: Married    Spouse name: Not on file   Number of children: 2   Years of education: MD   Highest education level: Not on file  Occupational History    Employer: [employer]   Occupation: MD/Adjunct Professor  Tobacco Use   Smoking status: Never   Smokeless tobacco: Never  Vaping Use   Vaping Use: Never used  Substance and Sexual Activity   Alcohol use: Yes    Alcohol/week: 7.0 standard drinks of alcohol    Types: 7 Standard drinks or equivalent per week   Drug use: No   Sexual activity: Yes    Partners: Male    Birth control/protection: Post-menopausal    Comment: husband vasectomy  Other Topics Concern   Not on file  Social History Narrative   Lives at home w/ husband and daughter   Retired OB/GYN physician   Patient is right handed.   Patient drinks 2 cups of caffeine daily.     Social Determinants of Health   Financial Resource Strain: Not on file  Food Insecurity: Not on file  Transportation Needs: Not on file  Physical Activity: Not on file  Stress: Not on file  Social Connections: Not on file   Family History  Problem Relation Age of Onset   Thyroid disease Mother    Hyperlipidemia Mother    Hypertension Mother    Diabetes Mother    Cancer Mother        melanoma   Heart disease Mother    Depression Mother    Sleep apnea Mother    Obesity Mother    Heart disease Father    Hyperlipidemia Father    Hypertension Father    Cancer Father        colon   Prostate cancer Father    Kidney disease Father    Asthma Sister    Asthma Brother    Breast cancer Neg Hx    Allergies  Allergen Reactions   Augmentin [Amoxicillin-Pot Clavulanate]     abd cramping   Cephalexin     Profuse diarrhea   Crestor [Rosuvastatin]     Liver function elevated    Current Outpatient Medications  Medication Sig Dispense Refill   baclofen  (LIORESAL) 10 MG tablet Take 0.5-1 tablets (5-10 mg total) by mouth at bedtime as needed for muscle spasms. 90 each 4   clonazePAM (KLONOPIN) 1 MG tablet Take 1 tablet (1 mg total) by mouth at bedtime. 90 tablet 1   Estradiol 10 MCG TABS vaginal tablet 1 tablet vaginally twice weekly 24 tablet 3   famciclovir (FAMVIR) 500 MG tablet 3 tablets (1500mg) at onset of fever blister symptoms 9 tablet 4   famotidine (PEPCID) 10 MG tablet Take 10 mg by mouth daily.     ketoconazole (NIZORAL) 2 % shampoo SMARTSIG:5 Milliliter(s) Topical 3 Times a Week     mometasone (ELOCON) 0.1 % ointment Apply topically twice weekly 45 g 1   Multiple Vitamins-Minerals (MULTIVITAMIN PO) Take 1 tablet by mouth daily. With calcium     nebivolol (BYSTOLIC) 5 MG tablet TAKE 1 TABLET BY MOUTH DAILY 90 tablet 1   olmesartan (BENICAR) 5 MG tablet Take 2 tablets (10 mg total) by mouth daily. 180 tablet 1   omega-3 acid ethyl esters (LOVAZA) 1 g capsule TAKE TWO CAPSULES BY MOUTH TWICE DAILY 360 capsule 1   Probiotic Product (ALIGN PO) Take 1 tablet by mouth daily.     sulfamethoxazole-trimethoprim (BACTRIM DS) 800-160 MG tablet Take 1 tablet by mouth 2 (two) times daily for 7 days. 14 tablet 0   No current facility-administered medications for this visit.   No results found.  Review of Systems:   A ROS was performed including pertinent positives and negatives as documented in the HPI.  Physical Exam :   Constitutional: NAD and appears stated age Neurological: Alert and oriented Psych: Appropriate affect and cooperative Last menstrual period 06/26/2004.   Comprehensive Musculoskeletal Exam:    There is redness and erythema about the tip of the olecranon with evidence of infectious bursitis.  There is a mild amount of fluctuance.  This measures approximately 2 cm.  There is no surrounding induration or erythema.  Imaging:    I personally reviewed and interpreted the radiographs.   Assessment:   66 y.o. female with  left elbow infectious bursitis.  Culture was positive for Staph aureus.  Given the fact that she is not improving on the Bactrim at this time I believe   an MRI is needed in order to rule out any type of underlying osteomyelitis.  Will plan to proceed with this and I will see her back following this we can discuss results  Plan :    -Plan for left elbow follow-up discuss results     I personally saw and evaluated the patient, and participated in the management and treatment plan.  Cayson Kalb, MD Attending Physician, Orthopedic Surgery  This document was dictated using Dragon voice recognition software. A reasonable attempt at proof reading has been made to minimize errors. 

## 2023-04-30 ENCOUNTER — Telehealth (HOSPITAL_BASED_OUTPATIENT_CLINIC_OR_DEPARTMENT_OTHER): Payer: Self-pay | Admitting: Orthopaedic Surgery

## 2023-04-30 ENCOUNTER — Telehealth: Payer: Self-pay

## 2023-04-30 ENCOUNTER — Ambulatory Visit
Admission: RE | Admit: 2023-04-30 | Discharge: 2023-04-30 | Disposition: A | Discharge status: Home / Self Care | Payer: BC Managed Care – PPO | Source: Ambulatory Visit | Attending: Orthopaedic Surgery | Admitting: Orthopaedic Surgery

## 2023-04-30 DIAGNOSIS — M7022 Olecranon bursitis, left elbow: Secondary | ICD-10-CM

## 2023-04-30 DIAGNOSIS — S46312A Strain of muscle, fascia and tendon of triceps, left arm, initial encounter: Secondary | ICD-10-CM | POA: Diagnosis not present

## 2023-04-30 NOTE — Telephone Encounter (Signed)
[facility] called with a call report for a STAT MRI elbow ordered for this pt. Report is in chart.

## 2023-04-30 NOTE — Telephone Encounter (Signed)
Patient states that she had a stat MRI and Dr B told her to call when she gets it. She said she just left the MRI place and feels she should not wait unitl Monday  [phone]

## 2023-04-30 NOTE — Telephone Encounter (Signed)
Dr. Steward Drone aware and called and discussed results with patient

## 2023-05-01 ENCOUNTER — Encounter (HOSPITAL_BASED_OUTPATIENT_CLINIC_OR_DEPARTMENT_OTHER): Payer: Self-pay | Admitting: Orthopaedic Surgery

## 2023-05-03 ENCOUNTER — Encounter (HOSPITAL_BASED_OUTPATIENT_CLINIC_OR_DEPARTMENT_OTHER)
Admission: RE | Admit: 2023-05-03 | Discharge: 2023-05-03 | Disposition: A | Discharge status: Home / Self Care | Payer: BC Managed Care – PPO | Source: Ambulatory Visit | Attending: Orthopaedic Surgery | Admitting: Orthopaedic Surgery

## 2023-05-03 ENCOUNTER — Other Ambulatory Visit: Payer: Self-pay

## 2023-05-03 ENCOUNTER — Ambulatory Visit (HOSPITAL_BASED_OUTPATIENT_CLINIC_OR_DEPARTMENT_OTHER): Payer: Self-pay | Admitting: Orthopaedic Surgery

## 2023-05-03 ENCOUNTER — Encounter (HOSPITAL_BASED_OUTPATIENT_CLINIC_OR_DEPARTMENT_OTHER): Payer: Self-pay | Admitting: Orthopaedic Surgery

## 2023-05-03 DIAGNOSIS — X58XXXA Exposure to other specified factors, initial encounter: Secondary | ICD-10-CM | POA: Diagnosis not present

## 2023-05-03 DIAGNOSIS — K219 Gastro-esophageal reflux disease without esophagitis: Secondary | ICD-10-CM | POA: Diagnosis not present

## 2023-05-03 DIAGNOSIS — M7022 Olecranon bursitis, left elbow: Secondary | ICD-10-CM

## 2023-05-03 DIAGNOSIS — I10 Essential (primary) hypertension: Secondary | ICD-10-CM | POA: Diagnosis not present

## 2023-05-03 DIAGNOSIS — Z01810 Encounter for preprocedural cardiovascular examination: Secondary | ICD-10-CM | POA: Insufficient documentation

## 2023-05-03 DIAGNOSIS — S46312A Strain of muscle, fascia and tendon of triceps, left arm, initial encounter: Secondary | ICD-10-CM | POA: Diagnosis not present

## 2023-05-03 NOTE — Anesthesia Preprocedure Evaluation (Signed)
Anesthesia Evaluation  Patient identified by MRN, date of birth, ID band Patient awake    Reviewed: Allergy & Precautions, H&P , NPO status , Patient's Chart, lab work & pertinent test results  History of Anesthesia Complications (+) PONV and history of anesthetic complications  Airway Mallampati: II  TM Distance: >3 FB Neck ROM: Full    Dental no notable dental hx. (+) Teeth Intact, Dental Advisory Given   Pulmonary neg pulmonary ROS   Pulmonary exam normal breath sounds clear to auscultation       Cardiovascular hypertension, Pt. on home beta blockers and Pt. on medications Normal cardiovascular exam Rhythm:Regular Rate:Normal     Neuro/Psych negative neurological ROS  negative psych ROS   GI/Hepatic ,GERD  Medicated and Controlled,,(+) Hepatitis -, Unspecified  Endo/Other  negative endocrine ROS    Renal/GU negative Renal ROS  negative genitourinary   Musculoskeletal negative musculoskeletal ROS (+)    Abdominal   Peds negative pediatric ROS (+)  Hematology negative hematology ROS (+)   Anesthesia Other Findings   Reproductive/Obstetrics negative OB ROS                              Anesthesia Physical Anesthesia Plan  ASA: 3  Anesthesia Plan: General and Regional   Post-op Pain Management: Minimal or no pain anticipated, Tylenol PO (pre-op)* and Celebrex PO (pre-op)*   Induction: Intravenous  PONV Risk Score and Plan: 3 and Ondansetron, TIVA and Scopolamine patch - Pre-op  Airway Management Planned: Oral ETT and LMA  Additional Equipment: None  Intra-op Plan:   Post-operative Plan: Extubation in OR  Informed Consent: I have reviewed the patients History and Physical, chart, labs and discussed the procedure including the risks, benefits and alternatives for the proposed anesthesia with the patient or authorized representative who has indicated his/her understanding and  acceptance.     Dental advisory given  Plan Discussed with: Anesthesiologist and CRNA  Anesthesia Plan Comments: (Discussed both nerve block for pain relief post-op and GA; including NV, sore throat, dental injury, and pulmonary complications)         Anesthesia Quick Evaluation

## 2023-05-04 ENCOUNTER — Ambulatory Visit (HOSPITAL_BASED_OUTPATIENT_CLINIC_OR_DEPARTMENT_OTHER)
Admission: RE | Admit: 2023-05-04 | Discharge: 2023-05-04 | Disposition: A | Discharge status: Home / Self Care | Payer: BC Managed Care – PPO | Source: Ambulatory Visit | Attending: Orthopaedic Surgery | Admitting: Orthopaedic Surgery

## 2023-05-04 ENCOUNTER — Ambulatory Visit (HOSPITAL_BASED_OUTPATIENT_CLINIC_OR_DEPARTMENT_OTHER): Payer: BC Managed Care – PPO | Admitting: Anesthesiology

## 2023-05-04 ENCOUNTER — Encounter (HOSPITAL_BASED_OUTPATIENT_CLINIC_OR_DEPARTMENT_OTHER): Payer: Self-pay | Admitting: Orthopaedic Surgery

## 2023-05-04 ENCOUNTER — Encounter (HOSPITAL_BASED_OUTPATIENT_CLINIC_OR_DEPARTMENT_OTHER)
Admission: RE | Disposition: A | Discharge status: Home / Self Care | Payer: Self-pay | Source: Ambulatory Visit | Attending: Orthopaedic Surgery

## 2023-05-04 DIAGNOSIS — M7022 Olecranon bursitis, left elbow: Secondary | ICD-10-CM | POA: Diagnosis not present

## 2023-05-04 DIAGNOSIS — G8918 Other acute postprocedural pain: Secondary | ICD-10-CM | POA: Diagnosis not present

## 2023-05-04 DIAGNOSIS — I10 Essential (primary) hypertension: Secondary | ICD-10-CM | POA: Diagnosis not present

## 2023-05-04 DIAGNOSIS — S46312A Strain of muscle, fascia and tendon of triceps, left arm, initial encounter: Secondary | ICD-10-CM | POA: Diagnosis not present

## 2023-05-04 DIAGNOSIS — X58XXXA Exposure to other specified factors, initial encounter: Secondary | ICD-10-CM | POA: Diagnosis not present

## 2023-05-04 DIAGNOSIS — K219 Gastro-esophageal reflux disease without esophagitis: Secondary | ICD-10-CM | POA: Diagnosis not present

## 2023-05-04 HISTORY — DX: Nausea with vomiting, unspecified: R11.2

## 2023-05-04 HISTORY — DX: Other specified postprocedural states: Z98.890

## 2023-05-04 HISTORY — PX: IRRIGATION AND DEBRIDEMENT ELBOW: SHX6886

## 2023-05-04 SURGERY — IRRIGATION AND DEBRIDEMENT ELBOW
Anesthesia: Regional | Site: Elbow | Laterality: Left

## 2023-05-04 MED ORDER — ACETAMINOPHEN 160 MG/5ML PO SOLN: 325.0000 mg | ORAL | Status: DC | PRN

## 2023-05-04 MED ORDER — GABAPENTIN 300 MG PO CAPS
300.0000 mg | ORAL_CAPSULE | Freq: Once | ORAL | Status: AC
  Administered 2023-05-04: 300 mg via ORAL

## 2023-05-04 MED ORDER — IBUPROFEN 800 MG PO TABS: 800.0000 mg | ORAL_TABLET | Freq: Three times a day (TID) | ORAL | 0 refills | Status: AC

## 2023-05-04 MED ORDER — BUPIVACAINE LIPOSOME 1.3 % IJ SUSP
INTRAMUSCULAR | Status: DC | PRN
  Administered 2023-05-04: 10 mL via PERINEURAL

## 2023-05-04 MED ORDER — FENTANYL CITRATE (PF) 100 MCG/2ML IJ SOLN
100.0000 ug | Freq: Once | INTRAMUSCULAR | Status: AC
  Administered 2023-05-04: 100 ug via INTRAVENOUS

## 2023-05-04 MED ORDER — BUPIVACAINE HCL (PF) 0.5 % IJ SOLN
INTRAMUSCULAR | Status: DC | PRN
  Administered 2023-05-04: 15 mL via PERINEURAL

## 2023-05-04 MED ORDER — FENTANYL CITRATE (PF) 100 MCG/2ML IJ SOLN
INTRAMUSCULAR | Status: DC | PRN
  Administered 2023-05-04 (×2): 50 ug via INTRAVENOUS

## 2023-05-04 MED ORDER — OXYCODONE HCL 5 MG PO TABS: 5.0000 mg | ORAL_TABLET | ORAL | 0 refills | Status: DC | PRN

## 2023-05-04 MED ORDER — HYDROMORPHONE HCL 1 MG/ML IJ SOLN
INTRAMUSCULAR | Status: AC
  Filled 2023-05-04: qty 0.5

## 2023-05-04 MED ORDER — GABAPENTIN 300 MG PO CAPS
ORAL_CAPSULE | ORAL | Status: AC
  Filled 2023-05-04: qty 1

## 2023-05-04 MED ORDER — MIDAZOLAM HCL 2 MG/2ML IJ SOLN
INTRAMUSCULAR | Status: AC
  Filled 2023-05-04: qty 2

## 2023-05-04 MED ORDER — MEPERIDINE HCL 25 MG/ML IJ SOLN: 6.2500 mg | INTRAMUSCULAR | Status: DC | PRN

## 2023-05-04 MED ORDER — LIDOCAINE 2% (20 MG/ML) 5 ML SYRINGE
INTRAMUSCULAR | Status: DC | PRN
  Administered 2023-05-04: 100 mg via INTRAVENOUS

## 2023-05-04 MED ORDER — LEVOFLOXACIN IN D5W 500 MG/100ML IV SOLN
INTRAVENOUS | Status: AC
  Filled 2023-05-04: qty 100

## 2023-05-04 MED ORDER — VANCOMYCIN HCL 500 MG IV SOLR
INTRAVENOUS | Status: AC
  Filled 2023-05-04: qty 10

## 2023-05-04 MED ORDER — OXYCODONE HCL 5 MG/5ML PO SOLN: 5.0000 mg | Freq: Once | ORAL | Status: AC | PRN

## 2023-05-04 MED ORDER — LACTATED RINGERS IV SOLN: INTRAVENOUS | Status: DC

## 2023-05-04 MED ORDER — ONDANSETRON HCL 4 MG/2ML IJ SOLN: 4.0000 mg | Freq: Once | INTRAMUSCULAR | Status: DC | PRN

## 2023-05-04 MED ORDER — ACETAMINOPHEN 500 MG PO TABS
ORAL_TABLET | ORAL | Status: AC
  Filled 2023-05-04: qty 2

## 2023-05-04 MED ORDER — LIDOCAINE 2% (20 MG/ML) 5 ML SYRINGE
INTRAMUSCULAR | Status: AC
  Filled 2023-05-04: qty 5

## 2023-05-04 MED ORDER — CELECOXIB 200 MG PO CAPS
ORAL_CAPSULE | ORAL | Status: AC
  Filled 2023-05-04: qty 1

## 2023-05-04 MED ORDER — MIDAZOLAM HCL 2 MG/2ML IJ SOLN
2.0000 mg | Freq: Once | INTRAMUSCULAR | Status: AC
  Administered 2023-05-04: 2 mg via INTRAVENOUS

## 2023-05-04 MED ORDER — FENTANYL CITRATE (PF) 100 MCG/2ML IJ SOLN
INTRAMUSCULAR | Status: AC
  Filled 2023-05-04: qty 2

## 2023-05-04 MED ORDER — SCOPOLAMINE 1 MG/3DAYS TD PT72
MEDICATED_PATCH | TRANSDERMAL | Status: AC
  Filled 2023-05-04: qty 1

## 2023-05-04 MED ORDER — TRANEXAMIC ACID-NACL 1000-0.7 MG/100ML-% IV SOLN
1000.0000 mg | INTRAVENOUS | Status: AC
  Administered 2023-05-04: 1000 mg via INTRAVENOUS

## 2023-05-04 MED ORDER — CELECOXIB 200 MG PO CAPS
200.0000 mg | ORAL_CAPSULE | Freq: Once | ORAL | Status: AC
  Administered 2023-05-04: 200 mg via ORAL

## 2023-05-04 MED ORDER — ONDANSETRON HCL 4 MG/2ML IJ SOLN
INTRAMUSCULAR | Status: AC
  Filled 2023-05-04: qty 2

## 2023-05-04 MED ORDER — ROPIVACAINE HCL 5 MG/ML IJ SOLN
INTRAMUSCULAR | Status: DC | PRN
  Administered 2023-05-04: 20 mL via PERINEURAL

## 2023-05-04 MED ORDER — DEXAMETHASONE SODIUM PHOSPHATE 10 MG/ML IJ SOLN
INTRAMUSCULAR | Status: AC
  Filled 2023-05-04: qty 1

## 2023-05-04 MED ORDER — ONDANSETRON HCL 4 MG/2ML IJ SOLN
INTRAMUSCULAR | Status: DC | PRN
  Administered 2023-05-04: 4 mg via INTRAVENOUS

## 2023-05-04 MED ORDER — HYDROMORPHONE HCL 1 MG/ML IJ SOLN
0.5000 mg | Freq: Once | INTRAMUSCULAR | Status: AC
  Administered 2023-05-04: 0.25 mg via INTRAVENOUS

## 2023-05-04 MED ORDER — OXYCODONE HCL 5 MG PO TABS
ORAL_TABLET | ORAL | Status: AC
  Filled 2023-05-04: qty 1

## 2023-05-04 MED ORDER — LEVOFLOXACIN IN D5W 500 MG/100ML IV SOLN
500.0000 mg | INTRAVENOUS | Status: AC
  Administered 2023-05-04: 500 mg via INTRAVENOUS

## 2023-05-04 MED ORDER — FENTANYL CITRATE (PF) 100 MCG/2ML IJ SOLN
50.0000 ug | Freq: Once | INTRAMUSCULAR | Status: AC
  Administered 2023-05-04: 50 ug via INTRAVENOUS

## 2023-05-04 MED ORDER — SCOPOLAMINE 1 MG/3DAYS TD PT72
1.0000 | MEDICATED_PATCH | TRANSDERMAL | Status: DC
  Administered 2023-05-04: 1.5 mg via TRANSDERMAL

## 2023-05-04 MED ORDER — ACETAMINOPHEN 325 MG PO TABS: 325.0000 mg | ORAL_TABLET | ORAL | Status: DC | PRN

## 2023-05-04 MED ORDER — VANCOMYCIN HCL IN DEXTROSE 1-5 GM/200ML-% IV SOLN
INTRAVENOUS | Status: AC
  Filled 2023-05-04: qty 200

## 2023-05-04 MED ORDER — 0.9 % SODIUM CHLORIDE (POUR BTL) OPTIME
TOPICAL | Status: DC | PRN
  Administered 2023-05-04: 250 mL

## 2023-05-04 MED ORDER — PROPOFOL 10 MG/ML IV BOLUS
INTRAVENOUS | Status: DC | PRN
  Administered 2023-05-04: 130 mg via INTRAVENOUS

## 2023-05-04 MED ORDER — ACETAMINOPHEN 500 MG PO TABS: 1000.0000 mg | ORAL_TABLET | Freq: Once | ORAL | Status: AC

## 2023-05-04 MED ORDER — ACETAMINOPHEN 500 MG PO TABS
1000.0000 mg | ORAL_TABLET | Freq: Once | ORAL | Status: AC
  Administered 2023-05-04: 1000 mg via ORAL

## 2023-05-04 MED ORDER — VANCOMYCIN HCL 500 MG IV SOLR
INTRAVENOUS | Status: DC | PRN
  Administered 2023-05-04: 500 mg via TOPICAL

## 2023-05-04 MED ORDER — PROPOFOL 10 MG/ML IV BOLUS
INTRAVENOUS | Status: AC
  Filled 2023-05-04: qty 20

## 2023-05-04 MED ORDER — OXYCODONE HCL 5 MG PO TABS
5.0000 mg | ORAL_TABLET | Freq: Once | ORAL | Status: AC | PRN
  Administered 2023-05-04: 5 mg via ORAL

## 2023-05-04 MED ORDER — ACETAMINOPHEN 500 MG PO TABS: 500.0000 mg | ORAL_TABLET | Freq: Three times a day (TID) | ORAL | 0 refills | Status: AC

## 2023-05-04 MED ORDER — MIDAZOLAM HCL 2 MG/2ML IJ SOLN
INTRAMUSCULAR | Status: DC | PRN
  Administered 2023-05-04: 2 mg via INTRAVENOUS

## 2023-05-04 MED ORDER — TRANEXAMIC ACID-NACL 1000-0.7 MG/100ML-% IV SOLN
INTRAVENOUS | Status: AC
  Filled 2023-05-04: qty 100

## 2023-05-04 MED ORDER — FENTANYL CITRATE (PF) 100 MCG/2ML IJ SOLN
25.0000 ug | INTRAMUSCULAR | Status: DC | PRN
  Administered 2023-05-04: 50 ug via INTRAVENOUS
  Administered 2023-05-04: 25 ug via INTRAVENOUS
  Administered 2023-05-04: 50 ug via INTRAVENOUS
  Administered 2023-05-04: 25 ug via INTRAVENOUS

## 2023-05-04 MED ORDER — VANCOMYCIN HCL IN DEXTROSE 1-5 GM/200ML-% IV SOLN
1000.0000 mg | INTRAVENOUS | Status: AC
  Administered 2023-05-04: 1000 mg via INTRAVENOUS

## 2023-05-04 MED ORDER — CEPHALEXIN 500 MG PO CAPS: 500.0000 mg | ORAL_CAPSULE | Freq: Four times a day (QID) | ORAL | 0 refills | Status: AC

## 2023-05-04 MED ORDER — DEXAMETHASONE SODIUM PHOSPHATE 10 MG/ML IJ SOLN
INTRAMUSCULAR | Status: DC | PRN
  Administered 2023-05-04: 4 mg via INTRAVENOUS

## 2023-05-04 SURGICAL SUPPLY — 61 items
APL PRP STRL LF DISP 70% ISPRP (MISCELLANEOUS) ×2
BLADE SURG 15 STRL LF DISP TIS (BLADE) ×4 IMPLANT
BLADE SURG 15 STRL SS (BLADE) ×4
BNDG CMPR 5X3 KNIT ELC UNQ LF (GAUZE/BANDAGES/DRESSINGS) ×2
BNDG CMPR 5X4 CHSV STRCH STRL (GAUZE/BANDAGES/DRESSINGS) ×2
BNDG CMPR 5X4 KNIT ELC UNQ LF (GAUZE/BANDAGES/DRESSINGS) ×2
BNDG CMPR 9X4 STRL LF SNTH (GAUZE/BANDAGES/DRESSINGS)
BNDG COHESIVE 4X5 TAN STRL LF (GAUZE/BANDAGES/DRESSINGS) ×2 IMPLANT
BNDG ELASTIC 3INX 5YD STR LF (GAUZE/BANDAGES/DRESSINGS) ×2 IMPLANT
BNDG ELASTIC 4INX 5YD STR LF (GAUZE/BANDAGES/DRESSINGS) ×2 IMPLANT
BNDG ESMARK 4X9 LF (GAUZE/BANDAGES/DRESSINGS) IMPLANT
BNDG GAUZE DERMACEA FLUFF 4 (GAUZE/BANDAGES/DRESSINGS) ×2 IMPLANT
BNDG GZE DERMACEA 4 6PLY (GAUZE/BANDAGES/DRESSINGS) ×2
CHLORAPREP W/TINT 26 (MISCELLANEOUS) ×2 IMPLANT
COVER BACK TABLE 60X90IN (DRAPES) ×2 IMPLANT
COVER MAYO STAND STRL (DRAPES) ×2 IMPLANT
DRAPE EXTREMITY T 121X128X90 (DISPOSABLE) ×2 IMPLANT
DRAPE IMP U-DRAPE 54X76 (DRAPES) ×2 IMPLANT
DRAPE OEC MINIVIEW 54X84 (DRAPES) IMPLANT
DRAPE U-SHAPE 47X51 STRL (DRAPES) ×2 IMPLANT
ELECT REM PT RETURN 9FT ADLT (ELECTROSURGICAL) ×2
ELECTRODE REM PT RTRN 9FT ADLT (ELECTROSURGICAL) ×2 IMPLANT
GAUZE SPONGE 4X4 12PLY STRL (GAUZE/BANDAGES/DRESSINGS) ×2 IMPLANT
GAUZE XEROFORM 1X8 LF (GAUZE/BANDAGES/DRESSINGS) ×2 IMPLANT
GLOVE BIO SURGEON STRL SZ 6 (GLOVE) ×2 IMPLANT
GLOVE BIO SURGEON STRL SZ7 (GLOVE) ×2 IMPLANT
GLOVE BIO SURGEON STRL SZ7.5 (GLOVE) ×2 IMPLANT
GLOVE BIOGEL PI IND STRL 6.5 (GLOVE) ×2 IMPLANT
GLOVE BIOGEL PI IND STRL 8 (GLOVE) ×2 IMPLANT
GOWN STRL REUS W/ TWL LRG LVL3 (GOWN DISPOSABLE) ×4 IMPLANT
GOWN STRL REUS W/TWL LRG LVL3 (GOWN DISPOSABLE) ×4
GOWN STRL REUS W/TWL XL LVL3 (GOWN DISPOSABLE) ×2 IMPLANT
NDL HYPO 25X1 1.5 SAFETY (NEEDLE) IMPLANT
NEEDLE HYPO 25X1 1.5 SAFETY (NEEDLE) IMPLANT
NS IRRIG 1000ML POUR BTL (IV SOLUTION) ×2 IMPLANT
PACK BASIN DAY SURGERY FS (CUSTOM PROCEDURE TRAY) ×2 IMPLANT
PAD CAST 3X4 CTTN HI CHSV (CAST SUPPLIES) ×2 IMPLANT
PAD CAST 4YDX4 CTTN HI CHSV (CAST SUPPLIES) ×2 IMPLANT
PADDING CAST COTTON 3X4 STRL (CAST SUPPLIES) ×2
PADDING CAST COTTON 4X4 STRL (CAST SUPPLIES) ×2
PENCIL SMOKE EVACUATOR (MISCELLANEOUS) ×2 IMPLANT
SLEEVE SCD COMPRESS KNEE MED (STOCKING) ×2 IMPLANT
SLING ARM FOAM STRAP LRG (SOFTGOODS) IMPLANT
STRIP SUTURE WOUND CLOSURE 1/2 (MISCELLANEOUS) IMPLANT
SUCTION TUBE FRAZIER 10FR DISP (SUCTIONS) ×2 IMPLANT
SUT ETHILON 3 0 PS 1 (SUTURE) ×2 IMPLANT
SUT FIBERWIRE #2 38 REV NDL BL (SUTURE)
SUT MNCRL AB 3-0 PS2 18 (SUTURE) IMPLANT
SUT MNCRL AB 4-0 PS2 18 (SUTURE) IMPLANT
SUT VIC AB 0 CT1 27 (SUTURE) ×2
SUT VIC AB 0 CT1 27XBRD ANBCTR (SUTURE) ×2 IMPLANT
SUT VIC AB 2-0 CT1 27 (SUTURE) ×2
SUT VIC AB 2-0 CT1 TAPERPNT 27 (SUTURE) ×2 IMPLANT
SUT VIC AB 2-0 SH 27 (SUTURE)
SUT VIC AB 2-0 SH 27XBRD (SUTURE) IMPLANT
SUTURE FIBERWR#2 38 REV NDL BL (SUTURE) IMPLANT
SYR BULB EAR ULCER 3OZ GRN STR (SYRINGE) ×2 IMPLANT
SYR CONTROL 10ML LL (SYRINGE) IMPLANT
TOWEL GREEN STERILE FF (TOWEL DISPOSABLE) ×4 IMPLANT
TUBE CONNECTING 20X1/4 (TUBING) ×2 IMPLANT
UNDERPAD 30X36 HEAVY ABSORB (UNDERPADS AND DIAPERS) ×2 IMPLANT

## 2023-05-04 NOTE — Anesthesia Procedure Notes (Signed)
Procedure Name: LMA Insertion Date/Time: 05/04/2023 9:53 AM  Performed by: Francie Massing, CRNAPre-anesthesia Checklist: Patient identified, Emergency Drugs available, Suction available and Patient being monitored Patient Re-evaluated:Patient Re-evaluated prior to induction Oxygen Delivery Method: Circle system utilized Preoxygenation: Pre-oxygenation with 100% oxygen Induction Type: IV induction Ventilation: Mask ventilation without difficulty LMA: LMA inserted LMA Size: 4.0 Number of attempts: 1 Airway Equipment and Method: Bite block Placement Confirmation: positive ETCO2 Tube secured with: Tape Dental Injury: Teeth and Oropharynx as per pre-operative assessment

## 2023-05-04 NOTE — Interval H&P Note (Signed)
History and Physical Interval Note:  05/04/2023 8:18 AM  Jessica Mack  has presented today for surgery, with the diagnosis of LEFT OLECRANON BURSITIS.  The various methods of treatment have been discussed with the patient and family. After consideration of risks, benefits and other options for treatment, the patient has consented to  Procedure(s): LEFT ELBOW IRRIGATION AND DEBRIDEMENT (Left) APPLICATION OF WOUND VAC (Left) as a surgical intervention.  The patient's history has been reviewed, patient examined, no change in status, stable for surgery.  I have reviewed the patient's chart and labs.  Questions were answered to the patient's satisfaction.     [name]

## 2023-05-04 NOTE — Transfer of Care (Signed)
Immediate Anesthesia Transfer of Care Note  Patient: Jessica Mack  Procedure(s) Performed: Procedure(s) (LRB): LEFT ELBOW IRRIGATION AND DEBRIDEMENT (Left)  Patient Location: PACU  Anesthesia Type: General  Level of Consciousness: awake, oriented, sedated and patient cooperative  Airway & Oxygen Therapy: Patient Spontanous Breathing and Patient connected to face mask oxygen  Post-op Assessment: Report given to PACU RN and Post -op Vital signs reviewed and stable  Post vital signs: Reviewed and stable  Complications: No apparent anesthesia complications Last Vitals:  Vitals Value Taken Time  BP    Temp    Pulse 69 05/04/23 1043  Resp 11 05/04/23 1043  SpO2 99 % 05/04/23 1043  Vitals shown include unvalidated device data.  Last Pain:  Vitals:   05/04/23 0809  TempSrc: Oral  PainSc: 3       Patients Stated Pain Goal: 4 (05/04/23 0809)  Complications: No notable events documented.

## 2023-05-04 NOTE — Discharge Instructions (Addendum)
Discharge Instructions    Attending Surgeon: [name], MD Office Phone Number: [phone]   Diagnosis and Procedures:    Surgeries Performed: Left elbow olecranon bursectomy, triceps repair  Discharge Plan:    Diet: Resume usual diet. Begin with light or bland foods.  Drink plenty of fluids.  Activity:  Keep sling in place until your nerve block has worn off. No lifting more than 5 pounds You are advised to go home directly from the hospital or surgical center. Restrict your activities.  GENERAL INSTRUCTIONS: 1.  Keep your surgical site elevated above your heart for at least 5-7 days or longer to prevent swelling. This will improve your comfort and your overall recovery following surgery.     2. Please call Dr. Serena Croissant office at [phone] with questions Monday-Friday during business hours. If no one answers, please leave a message and someone should get back to the patient within 24 hours. For emergencies please call 911 or proceed to the emergency room.   3. Patient to notify surgical team if experiences any of the following: Bowel/Bladder dysfunction, uncontrolled pain, nerve/muscle weakness, incision with increased drainage or redness, nausea/vomiting and Fever greater than 101.0 F.  Be alert for signs of infection including redness, streaking, odor, fever or chills. Be alert for excessive pain or bleeding and notify your surgeon immediately.  WOUND INSTRUCTIONS:   Leave your dressing/cast/splint in place until your post operative visit.  Keep it clean and dry.  Always keep the incision clean and dry until the staples/sutures are removed. If there is no drainage from the incision you should keep it open to air. If there is drainage from the incision you must keep it covered at all times until the drainage stops  Do not soak in a bath tub, hot tub, pool, lake or other body of water until 21 days after your surgery and your incision is completely dry and  healed.  If you have removable sutures (or staples) they must be removed 10-14 days (unless otherwise instructed) from the day of your surgery.     1)  Elevate the extremity as much as possible.  2)  Keep the dressing clean and dry.  3)  Please call us if the dressing becomes wet or dirty.  4)  If you are experiencing worsening pain or worsening swelling, please call.     MEDICATIONS: Resume all previous home medications at the previous prescribed dose and frequency unless otherwise noted Start taking the  pain medications on an as-needed basis as prescribed  Please taper down pain medication over the next week following surgery.  Ideally you should not require a refill of any narcotic pain medication.  Take pain medication with food to minimize nausea. In addition to the prescribed pain medication, you may take over-the-counter pain relievers such as Tylenol.  Do NOT take additional tylenol if your pain medication already has tylenol in it.  Aspirin 325mg  daily for four weeks.      FOLLOWUP INSTRUCTIONS: 1. Follow up at the [facility] Therapy [facility] 3-4 days following surgery. This appointment should be scheduled unless other arrangements have been made.The Physical Therapy scheduling number is [phone] if an appointment has not already been arranged.  2. Contact Dr. Serena Croissant office during office hours at [phone] or the practice after hours line at [phone] for non-emergencies. For medical emergencies call 911.   Discharge Location: Home   May have Tylenol at 2:12 if needed.   Post Anesthesia Home Care Instructions  Activity: Get plenty of rest for the remainder of the day. A responsible individual must stay with you for 24 hours following the procedure.  For the next 24 hours, DO NOT: -Drive a car -Advertising copywriter -Drink alcoholic beverages -Take any medication unless instructed by your physician -Make any legal decisions or sign important  papers.  Meals: Start with liquid foods such as gelatin or soup. Progress to regular foods as tolerated. Avoid greasy, spicy, heavy foods. If nausea and/or vomiting occur, drink only clear liquids until the nausea and/or vomiting subsides. Call your physician if vomiting continues.  Special Instructions/Symptoms: Your throat may feel dry or sore from the anesthesia or the breathing tube placed in your throat during surgery. If this causes discomfort, gargle with warm salt water. The discomfort should disappear within 24 hours.  If you had a scopolamine patch placed behind your ear for the management of post- operative nausea and/or vomiting:  1. The medication in the patch is effective for 72 hours, after which it should be removed.  Wrap patch in a tissue and discard in the trash. Wash hands thoroughly with soap and water. 2. You may remove the patch earlier than 72 hours if you experience unpleasant side effects which may include dry mouth, dizziness or visual disturbances. 3. Avoid touching the patch. Wash your hands with soap and water after contact with the patch.

## 2023-05-04 NOTE — Anesthesia Procedure Notes (Addendum)
Anesthesia Regional Block: Supraclavicular block   Pre-Anesthetic Checklist: , timeout performed,  Correct Patient, Correct Site, Correct Laterality,  Correct Procedure, Correct Position, site marked,  Risks and benefits discussed,  Surgical consent,  Pre-op evaluation,  At surgeon's request and post-op pain management  Laterality: Left  Prep: chloraprep       Needles:  Injection technique: Single-shot  Needle Type: Echogenic Stimulator Needle     Needle Length: 5cm  Needle Gauge: 22     Additional Needles:   Procedures:,,,, ultrasound used (permanent image in chart),,    Narrative:  Start time: 05/04/2023 1:30 PM End time: 05/04/2023 1:35 PM Injection made incrementally with aspirations every 5 mL.  Performed by: Personally  Anesthesiologist: Bethena Midget, MD  Additional Notes: Functioning IV was confirmed and monitors were applied.  A 50mm 22ga Arrow echogenic stimulator needle was used. Sterile prep and drape,hand hygiene and sterile gloves were used. Ultrasound guidance: relevant anatomy identified, needle position confirmed, local anesthetic spread visualized around nerve(s)., vascular puncture avoided.  Image printed for medical record. Negative aspiration and negative test dose prior to incremental administration of local anesthetic. The patient tolerated the procedure well.   Rescue Block for post op pain

## 2023-05-04 NOTE — Brief Op Note (Signed)
   Brief Op Note  Date of Surgery: 05/04/2023  Preoperative Diagnosis: LEFT OLECRANON BURSITIS  Postoperative Diagnosis: same  Procedure: Procedure(s): LEFT ELBOW IRRIGATION AND DEBRIDEMENT APPLICATION OF WOUND VAC  Implants: * No implants in log *  Surgeons: Surgeon(s): [name], MD  Anesthesia: General    Estimated Blood Loss: See anesthesia record  Complications: None  Condition to PACU: Stable  Benancio Deeds, MD 05/04/2023 10:30 AM

## 2023-05-04 NOTE — Anesthesia Postprocedure Evaluation (Signed)
Anesthesia Post Note  Patient: Jessica Mack  Procedure(s) Performed: LEFT ELBOW IRRIGATION AND DEBRIDEMENT (Left: Elbow)     Patient location during evaluation: PACU Anesthesia Type: Regional and General Level of consciousness: awake and alert Pain management: pain level controlled Vital Signs Assessment: post-procedure vital signs reviewed and stable Respiratory status: spontaneous breathing, nonlabored ventilation, respiratory function stable and patient connected to nasal cannula oxygen Cardiovascular status: blood pressure returned to baseline and stable Postop Assessment: no apparent nausea or vomiting Anesthetic complications: no   No notable events documented.  Last Vitals:  Vitals:   05/04/23 1346 05/04/23 1429  BP: 103/73 115/79  Pulse: (!) 59 74  Resp:  20  Temp:  (!) 36.1 C  SpO2: 99% 96%    Last Pain:  Vitals:   05/04/23 1429  TempSrc: Temporal  PainSc: 0-No pain                 Dvonte Gatliff

## 2023-05-04 NOTE — Op Note (Signed)
   Date of Surgery: 05/04/2023  INDICATIONS: Jessica Mack is a 67 y.o.-year-old female with left olecranon bursitis which is failed treatment with antibiotic therapy.  The risk and benefits of the procedure were discussed in detail and documented in the pre-operative evaluation.   PREOPERATIVE DIAGNOSIS: 1.  Left elbow olecranon bursitis 2.  Left elbow longitudinal triceps split/tear  POSTOPERATIVE DIAGNOSIS: Same.  PROCEDURE: 1.  Left elbow olecranon bursectomy 2.  Left elbow triceps repair  SURGEON: Benancio Deeds MD  ASSISTANT: Kerby Less, ATC  ANESTHESIA:  general  IV FLUIDS AND URINE: See anesthesia record.  ANTIBIOTICS: Per anesthesia record  ESTIMATED BLOOD LOSS: 10 mL.  IMPLANTS:  * No implants in log *  DRAINS: None  CULTURES: None  COMPLICATIONS: none  DESCRIPTION OF PROCEDURE:   Patient was identified in the preoperative holding area.  Correct site was marked for universal protocol nursing.  Antibiotics were given 1 hour prior to skin incision.  She is subsequently taken back to the operating room.  She was positioned in the lateral decubitus position with all bony prominences including the peroneal nerve well-padded.  The left arm was prepped and draped in usual sterile fashion.  Final timeout was again performed.  15 blade was used to incise through midline incision directly over the olecranon.  The bursa was encountered and this was removed with sharp dissection and excised with 15 blade and ronguer.  At this time there was a small longitudinal split identified in the triceps tendon.  A side-to-side repair was performed with 0 Vicryl in a figure-of-eight fashion a total of 2 stitches.  This approximated nicely.  The remaining bursa was excised with 15 blade proximally and distally.  Wound was thoroughly irrigated.  500 cc of vancomycin powder was placed.  The wound was closed in layers of 2-0 Vicryl and 3-0 nylon in mattress fashion.  Soft dressing with gauze Webril  and Ace was applied.  All counts were correct at the end of the case.  There were no complications   POSTOPERATIVE PLAN: She will be activity as tolerated on the left arm once her nerve block wears off and she may range of motion as tolerated.  She will have a 5 pound lifting restriction.  She will be placed on a 3-year course of Keflex.  I will see her back in 2 weeks for suture removal  Benancio Deeds, MD 10:35 AM

## 2023-05-04 NOTE — Progress Notes (Signed)
Assisted Dr. Oddono with left, supraclavicular, ultrasound guided block. Side rails up, monitors on throughout procedure. See vital signs in flow sheet. Tolerated Procedure well. 

## 2023-05-04 NOTE — Anesthesia Procedure Notes (Addendum)
Anesthesia Regional Block: Supraclavicular block   Pre-Anesthetic Checklist: , timeout performed,  Correct Patient, Correct Site, Correct Laterality,  Correct Procedure, Correct Position, site marked,  Risks and benefits discussed,  Surgical consent,  Pre-op evaluation,  At surgeon's request and post-op pain management  Laterality: Left  Prep: chloraprep       Needles:  Injection technique: Single-shot  Needle Type: Echogenic Stimulator Needle     Needle Length: 5cm  Needle Gauge: 22     Additional Needles:   Procedures:, nerve stimulator,,, ultrasound used (permanent image in chart),,     Nerve Stimulator or Paresthesia:  Response: hand, 0.45 mA  Additional Responses:   Narrative:  Start time: 05/04/2023 8:30 AM End time: 05/04/2023 8:35 AM Injection made incrementally with aspirations every 5 mL.  Performed by: Personally  Anesthesiologist: Bethena Midget, MD  Additional Notes: Functioning IV was confirmed and monitors were applied.  A 50mm 22ga Arrow echogenic stimulator needle was used. Sterile prep and drape,hand hygiene and sterile gloves were used. Ultrasound guidance: relevant anatomy identified, needle position confirmed, local anesthetic spread visualized around nerve(s)., vascular puncture avoided.  Image printed for medical record. Negative aspiration and negative test dose prior to incremental administration of local anesthetic. The patient tolerated the procedure well.

## 2023-05-05 ENCOUNTER — Encounter (HOSPITAL_BASED_OUTPATIENT_CLINIC_OR_DEPARTMENT_OTHER): Payer: Self-pay | Admitting: Orthopaedic Surgery

## 2023-05-05 NOTE — Telephone Encounter (Signed)
Returned call to patient who had few post op questions Confirmed aspirin should be taken at least 2 weeks post op per Dr. Serena Croissant protocol. Discharge paperwork stated 4 weeks. She voiced understanding and stated she will take aspirin at least until post op fu appt Sling usage-- I told her sling usage is for comfort until the block wears off, after that her motion can be ROM as tolerated but with a 5 lb restriction  Dressing-- Informed her she may remove the ace wrap/soft dressing after a couple days and place normal bandaids over the incision. Reiterated the importance of keeping the incision dry during showering. She purchased seran wrap to cover the wound when showering  Driving-- Patient asked when it would be okay for her to drive. I informed her there is no clearance to drive and as long as she is not on any narcotics and can safely get to the side of the road and keep two hands on the wheel  Pt voiced understanding with all questions.

## 2023-05-06 ENCOUNTER — Other Ambulatory Visit: Payer: BC Managed Care – PPO

## 2023-05-14 ENCOUNTER — Ambulatory Visit (INDEPENDENT_AMBULATORY_CARE_PROVIDER_SITE_OTHER): Payer: BC Managed Care – PPO | Admitting: Orthopaedic Surgery

## 2023-05-14 ENCOUNTER — Other Ambulatory Visit (HOSPITAL_BASED_OUTPATIENT_CLINIC_OR_DEPARTMENT_OTHER): Payer: Self-pay | Admitting: Orthopaedic Surgery

## 2023-05-14 DIAGNOSIS — M7022 Olecranon bursitis, left elbow: Secondary | ICD-10-CM

## 2023-05-14 NOTE — Progress Notes (Signed)
Post Operative Evaluation    Procedure/Date of Surgery: left elbow I and D with bursa excision, triceps repair 7/9  Interval History:    Presents 2 weeks status post above procedure. She states that there is some pain stiffness about the elbow. Denies any wound drainage. There is improved skin sensitivity.   PMH/PSH/Family History/Social History/Meds/Allergies:    Past Medical History:  Diagnosis Date   Avascular necrosis of bone (HCC)    Left talus   Blood transfusion    Breast cancer (HCC) 2005   left   Cancer (HCC)    Dysplastic nevus 1988   right hip   Fatty liver    GERD (gastroesophageal reflux disease)    History of breast cancer 2005   invasive ductal cancer left breast   Hypertension    Hypertriglyceridemia    Irritable bowel syndrome    Metabolic syndrome    resolved with diet and exercise   NASH (nonalcoholic steatohepatitis)    Nocturnal leg cramps 02/14/2015   Osteopenia    Palpitations 09/2012   normal echo   Peripheral neuropathy    Personal history of chemotherapy    Personal history of radiation therapy    Plantar fasciitis 03/2003   PONV (postoperative nausea and vomiting)    Tongue dysplasia    Ulcer    Past Surgical History:  Procedure Laterality Date   ANKLE SURGERY  07/2007   left, revascularization   BREAST LUMPECTOMY  06/2004   left, with port placement   CESAREAN SECTION  1991, 1993   x2   COLONOSCOPY  09/2013   Dr. Loreta Ave   DG GALL BLADDER     IRRIGATION AND DEBRIDEMENT ELBOW Left 05/04/2023   Procedure: LEFT ELBOW IRRIGATION AND DEBRIDEMENT;  Surgeon: [name], MD;  Location: [facility];  Service: Orthopedics;  Laterality: Left;   LAPAROSCOPIC CHOLECYSTECTOMY  2003   MOUTH SURGERY  11/15   implant/crown placed 4/16   SHOULDER ARTHROSCOPY  10/2005   right   SHOULDER SURGERY  12/15   left   TONGUE SURGERY  2012, 2013   WRIST SURGERY  1988   right, [place]   Social  History   Socioeconomic History   Marital status: Married    Spouse name: Not on file   Number of children: 2   Years of education: MD   Highest education level: Not on file  Occupational History    Employer: [employer]   Occupation: MD/Adjunct Professor  Tobacco Use   Smoking status: Never   Smokeless tobacco: Never  Vaping Use   Vaping status: Never Used  Substance and Sexual Activity   Alcohol use: Yes    Alcohol/week: 7.0 standard drinks of alcohol    Types: 7 Standard drinks or equivalent per week   Drug use: No   Sexual activity: Yes    Partners: Male    Birth control/protection: Post-menopausal    Comment: husband vasectomy  Other Topics Concern   Not on file  Social History Narrative   Lives at home w/ husband and daughter   Retired OB/GYN physician   Patient is right handed.   Patient drinks 2 cups of caffeine daily.   Social Determinants of Health   Financial Resource Strain: Not on file  Food Insecurity: Not on file  Transportation Needs: Not on  file  Physical Activity: Not on file  Stress: Not on file  Social Connections: Not on file   Family History  Problem Relation Age of Onset   Thyroid disease Mother    Hyperlipidemia Mother    Hypertension Mother    Diabetes Mother    Cancer Mother        melanoma   Heart disease Mother    Depression Mother    Sleep apnea Mother    Obesity Mother    Heart disease Father    Hyperlipidemia Father    Hypertension Father    Cancer Father        colon   Prostate cancer Father    Kidney disease Father    Asthma Sister    Asthma Brother    Breast cancer Neg Hx    Allergies  Allergen Reactions   Augmentin [Amoxicillin-Pot Clavulanate]     abd cramping   Cephalexin     Profuse diarrhea   Crestor [Rosuvastatin]     Liver function elevated    Current Outpatient Medications  Medication Sig Dispense Refill   acetaminophen (TYLENOL) 500 MG tablet Take 1 tablet (500 mg total) by mouth every 8  (eight) hours for 10 days. 30 tablet 0   baclofen (LIORESAL) 10 MG tablet Take 0.5-1 tablets (5-10 mg total) by mouth at bedtime as needed for muscle spasms. 90 each 4   clonazePAM (KLONOPIN) 1 MG tablet Take 1 tablet (1 mg total) by mouth at bedtime. 90 tablet 1   Estradiol 10 MCG TABS vaginal tablet 1 tablet vaginally twice weekly 24 tablet 3   famciclovir (FAMVIR) 500 MG tablet 3 tablets (1500mg ) at onset of fever blister symptoms 9 tablet 4   famotidine (PEPCID) 10 MG tablet Take 10 mg by mouth daily.     ibuprofen (ADVIL) 800 MG tablet Take 1 tablet (800 mg total) by mouth every 8 (eight) hours for 10 days. Please take with food, please alternate with acetaminophen 30 tablet 0   ketoconazole (NIZORAL) 2 % shampoo SMARTSIG:5 Milliliter(s) Topical 3 Times a Week     mometasone (ELOCON) 0.1 % ointment Apply topically twice weekly 45 g 1   Multiple Vitamins-Minerals (MULTIVITAMIN PO) Take 1 tablet by mouth daily. With calcium     nebivolol (BYSTOLIC) 5 MG tablet TAKE 1 TABLET BY MOUTH DAILY 90 tablet 1   olmesartan (BENICAR) 5 MG tablet Take 2 tablets (10 mg total) by mouth daily. 180 tablet 1   omega-3 acid ethyl esters (LOVAZA) 1 g capsule TAKE TWO CAPSULES BY MOUTH TWICE DAILY 360 capsule 1   oxyCODONE (ROXICODONE) 5 MG immediate release tablet Take 1 tablet (5 mg total) by mouth every 4 (four) hours as needed for severe pain or breakthrough pain. 5 tablet 0   Probiotic Product (ALIGN PO) Take 1 tablet by mouth daily.     tirzepatide (ZEPBOUND) 5 MG/0.5ML Pen Inject 5 mg into the skin once a week.     No current facility-administered medications for this visit.   No results found.  Review of Systems:   A ROS was performed including pertinent positives and negatives as documented in the HPI.   Musculoskeletal Exam:    Left elbow with well healing wound. Range of motion is 15-130 degrees. 10 loss of supination. 2+ RP.  Imaging:      I personally reviewed and interpreted the  radiographs.   Assessment:   2 weeks status post left bursal I and D overall doing well. I have  advised on a compression sleeve at this time. I will plan to see her back in 4 weeks for reassessment. Will we begin OT for range of motion of the elbow  Plan :    - return to clinic 4 weeks for reassessment      I personally saw and evaluated the patient, and participated in the management and treatment plan.  [name], MD Attending Physician, Orthopedic Surgery  This document was dictated using Dragon voice recognition software. A reasonable attempt at proof reading has been made to minimize errors.

## 2023-05-17 ENCOUNTER — Encounter (HOSPITAL_BASED_OUTPATIENT_CLINIC_OR_DEPARTMENT_OTHER): Payer: Self-pay | Admitting: Orthopaedic Surgery

## 2023-05-18 DIAGNOSIS — F4323 Adjustment disorder with mixed anxiety and depressed mood: Secondary | ICD-10-CM | POA: Diagnosis not present

## 2023-05-19 NOTE — Telephone Encounter (Signed)
Looks like pt has appt on 8/1

## 2023-05-24 ENCOUNTER — Encounter (HOSPITAL_BASED_OUTPATIENT_CLINIC_OR_DEPARTMENT_OTHER): Payer: Self-pay | Admitting: Orthopaedic Surgery

## 2023-05-25 NOTE — Therapy (Signed)
OUTPATIENT OCCUPATIONAL THERAPY ORTHO EVALUATION  Patient Name: Jessica Mack MRN: [id number] DOB:1956-08-07, 67 y.o., female Today's Date: 05/27/2023  PCP: Sanda Linger, MD REFERRING PROVIDER: [name], MD   END OF SESSION:  OT End of Session - 05/27/23 1514     Visit Number 1    Number of Visits 12    Date for OT Re-Evaluation 07/09/23    Authorization Type BCBS    OT Start Time 1515    OT Stop Time 1613    OT Time Calculation (min) 58 min    Activity Tolerance Patient tolerated treatment well;No increased pain;Patient limited by pain    Behavior During Therapy [facility] for tasks assessed/performed             Past Medical History:  Diagnosis Date   Avascular necrosis of bone (HCC)    Left talus   Blood transfusion    Breast cancer (HCC) 2005   left   Cancer (HCC)    Dysplastic nevus 1988   right hip   Fatty liver    GERD (gastroesophageal reflux disease)    History of breast cancer 2005   invasive ductal cancer left breast   Hypertension    Hypertriglyceridemia    Irritable bowel syndrome    Metabolic syndrome    resolved with diet and exercise   NASH (nonalcoholic steatohepatitis)    Nocturnal leg cramps 02/14/2015   Osteopenia    Palpitations 09/2012   normal echo   Peripheral neuropathy    Personal history of chemotherapy    Personal history of radiation therapy    Plantar fasciitis 03/2003   PONV (postoperative nausea and vomiting)    Tongue dysplasia    Ulcer    Past Surgical History:  Procedure Laterality Date   ANKLE SURGERY  07/2007   left, revascularization   BREAST LUMPECTOMY  06/2004   left, with port placement   CESAREAN SECTION  1991, 1993   x2   COLONOSCOPY  09/2013   Dr. Loreta Ave   DG GALL BLADDER     IRRIGATION AND DEBRIDEMENT ELBOW Left 05/04/2023   Procedure: LEFT ELBOW IRRIGATION AND DEBRIDEMENT;  Surgeon: [name], MD;  Location: [facility];  Service: Orthopedics;  Laterality: Left;   LAPAROSCOPIC  CHOLECYSTECTOMY  2003   MOUTH SURGERY  11/15   implant/crown placed 4/16   SHOULDER ARTHROSCOPY  10/2005   right   SHOULDER SURGERY  12/15   left   TONGUE SURGERY  2012, 2013   WRIST SURGERY  1988   right, DeQuervains   Patient Active Problem List   Diagnosis Date Noted   Triceps tendon rupture, left, initial encounter 05/04/2023   Olecranon bursitis of left elbow 05/04/2023   Elevated ferritin level 04/05/2023   Mild persistent asthma without complication 09/28/2022   NASH (nonalcoholic steatohepatitis) 06/04/2020   Hypertriglyceridemia    Routine general medical examination at a health care facility 02/20/2020   Essential hypertension 02/20/2020   Osteopenia 08/05/2014   Breast cancer of upper-outer quadrant of left female breast (HCC) 07/20/2013   Leukoplakia of oral mucosa, including tongue 07/26/2012   Essential and other specified forms of tremor 07/26/2012   Drug-induced polyneuropathy (HCC) 07/26/2012    ONSET DATE: 05/04/23 DOS elbow bursectomy and triceps repair   REFERRING DIAG: M70.22 (ICD-10-CM) - Olecranon bursitis of left elbow   THERAPY DIAG:  Pain in left elbow  Stiffness of left elbow, not elsewhere classified  Stiffness of left shoulder, not elsewhere classified  Muscle weakness (  generalized)  Paresthesia of skin  Other lack of coordination  Localized edema  Rationale for Evaluation and Treatment: Rehabilitation  SUBJECTIVE:   SUBJECTIVE STATEMENT: Now 3 weeks post-op.  She is a retired Web designer MD/surgeon who states falling onto her left elbow while playing pickle ball, receiving a scrape, and subsequently having an infection resolved and her joint space/bursa/synovium of the left elbow.  After different treatments were attempted, she eventually had a bursectomy surgery about 3 weeks ago.  She has been having some hypersensitivity since that time increased pain and decreased ability to do her daily activities.  She did receive an injection yesterday  to help with pain, and the doctor recommends scar mobilizations aggressively.  She states that she has been using her arm as fully as possible and states not being aware of a 5 pound weight restriction that the doctor has listed in her orders.   PERTINENT HISTORY: Per referral: "ROM of L elbow and desensitivity therapy "  Per MD orders, she needs "aggressive" scar mobilizations.  She has a history of an old wrist surgery, breast cancer treatments (radiation and chemo) that have left her with some peripheral neuropathy in feet and fingers- treatments were about 13 years ago.  She states retiring due to this paresthesia and pain in her hands and decreased fine motor skills  PRECAUTIONS: None  RED FLAGS: None   WEIGHT BEARING RESTRICTIONS: Yes ROM as tolerated with 5# restriction per MD notes   PAIN:  Are you having pain? Yes: NPRS scale: 4/10 at rest, at worst in past week, up to 8/10 Pain location: Left olecranon/surgical area Pain description: Burning, stabbing, tight Aggravating factors: Pushing more aggressively moving quickly Relieving factors: Rest  FALLS: Has patient fallen in last 6 months? Yes. Number of falls 1 -this accident.  Not considered a fall risk.  LIVING ENVIRONMENT: Lives with: lives with their family Has following equipment at home: None  PLOF: Independent  PATIENT GOALS: To decrease pain and increase ability in motion and strength in the left elbow for daily activities  NEXT MD VISIT: 06/10/23   OBJECTIVE: (All objective assessments below are from initial evaluation on: 05/27/23 unless otherwise specified.)   HAND DOMINANCE: Right   ADLs: Overall ADLs: States decreased ability to grab, hold household objects, pain and inability to open containers, use knife to cut, mild to moderate bathing problems as well.    FUNCTIONAL OUTCOME MEASURES: Eval: Quck DASH 47% impairment today  (Higher % Score  =  More Impairment)     UPPER EXTREMITY ROM     Shoulder to  Wrist AROM Left eval  Shoulder flexion 140 (160 Rt)   Shoulder abduction   Shoulder extension   Shoulder internal rotation   Shoulder external rotation   Elbow flexion 117  Elbow extension (-7)  Forearm supination 79  Forearm pronation  80  Wrist flexion WNL  Wrist extension WNL  (Blank rows = not tested)   Hand AROM Left eval  Full Fist Ability (or Gap to Distal Palmar Crease) full  Thumb Opposition  (Kapandji Scale)  WNL  (Blank rows = not tested)   UPPER EXTREMITY MMT:    Eval: Based on observations and limited range of motion, her strength would be rated 3-/5 MMT for elbow flexion and extension today, no resistance was applied due to 5 pound weight restriction.  Will be tested in detail once she is cleared by the doctor in about 2 weeks.  MMT Left TBD  Shoulder flexion  Shoulder abduction   Shoulder adduction   Shoulder extension   Shoulder internal rotation   Shoulder external rotation   Middle trapezius   Lower trapezius   Elbow flexion   Elbow extension   Forearm supination   Forearm pronation   Wrist flexion   Wrist extension   Wrist ulnar deviation   Wrist radial deviation   (Blank rows = not tested)  HAND FUNCTION: Eval: Observed weakness in affected hand.  Grip strength Right: 66 lbs, Left: 37 lbs   COORDINATION: Eval: Observed coordination impairments with affected Lt arm, seen by inability to fully move the elbow and shoulder.  Details will be tested TBD. Box and Blocks Test: TBD Blocks today   SENSATION: Eval: Around the left elbow surgical area, she has hypersensitivity of her scar/nerves, does not tolerate light touch well, signs of allodynia.   She has a history of paresthesia in the hands and the feet that she states can feel very painful to her (chronic from 13 years ago breast cancer treatments).  EDEMA:   Eval:  Mildly swollen in Lt elbow today  COGNITION: Eval: Overall cognitive status: WFL for evaluation today   OBSERVATIONS:    Eval: Her surgical area is pink and closed, looks mildly irritated and has hypersensitivity to touch but no other signs of infection.  Diffuse swelling but no significant focal swelling.  She has limited strength and range of motion that is largely due to inhibiting pains and likely some mild scar adhesions.  Overall she presents as allodynia and hypersensitivity to her formerly infected left elbow and surgical area, mild to moderate scar adhesions around the elbow, stiffness pain and weakness to the left elbow that does travel up to the triceps and she does have some stiffness through the left shoulder as well.   TODAY'S TREATMENT:  Post-evaluation treatment:  OT educates on the importance of touching around her sensitive area for desensitization that should be done progressively and systematically.  She is given handouts about this and it is explained in detail-including the pain gate theory, the use of vibration, the use of light touch that is progressive in nature, to be done 4-6 times a day as tolerated for 2 minutes at a time.  She was given a slightly compressive Tubigrip sleeve which she states feels better, and also will help her sleep at night.  It is not as compressive or aggressive as a neoprene sleeve.  Next she was also educated on the importance of scar mobilization to be done to shift the top layers of her skin to allow freedom of motion.  She is given nonslip blue Dycem to help grab her skin to push and pull in 3 different positions-horizontal, diagonal, circular.  She should do these things at least 4 times a day for 2 minutes at a time.  Neck she was also given active range of motion with slight overpressure at the shoulder, elbow and forearm.  She states feeling relatively nonpainful pressures while performing this today for review of understanding.  She was asked to do these things frequently but not very painfully at this time.  She states understanding and feels better by the end  of the session.  Exercises - "Raise the Roof"   - 4-6 x daily - 1-2 sets - 5-10 reps - Bend and Straighten Elbow  - 3-4 x daily - 1-2 sets - 10-15 reps - Palm Up / Palm Down  - 3-4 x daily - 1-2 sets - 10-15 reps -  Wrist ext stretches with supination and elbow extension, 5x 15sec, 4-6 x day Patient Education - Scar Massage/mobilizations & systematic desensitization for neuromuscular rehabilitation.     PATIENT EDUCATION: Education details: See tx section above for details  Person educated: Patient Education method: Verbal Instruction, Teach back, Handouts  Education comprehension: States and demonstrates understanding, Additional Education required    HOME EXERCISE PROGRAM: Access Code: [email or web address] URL: [email or web address] Date: 05/27/2023 Prepared by: [name]   GOALS: Goals reviewed with patient? Yes   SHORT TERM GOALS: (STG required if POC>30 days) Target Date: 06/11/23  Pt will obtain protective, custom orthotic. Goal status: TBD/PRN  2.  Pt will demo/state understanding of initial HEP to improve pain levels and prerequisite motion. Goal status: INITIAL   LONG TERM GOALS: Target Date: 07/09/23  Pt will improve functional ability by decreased impairment per Quick DASH assessment from 47% to 15% or better, for better quality of life. Goal status: INITIAL  2.  Pt will improve grip strength in Lt hand from 37lbs to at least 55lbs for functional use at home and in IADLs. Goal status: INITIAL  3.  Pt will improve A/ROM in Lt elbow flex/ext from 117 / (-7)  to at least 140 / (-5), to have functional motion for tasks like reach and grasp.  Goal status: INITIAL  4.  Pt will improve strength in Lt elbow flex and ext from painful 3-/5 MMT to at least 4+/5 MMT to have increased functional ability to carry out selfcare and higher-level homecare tasks with no difficulty. Goal status: INITIAL  5.  Pt will improve coordination skills in Lt arm, as seen by  [facility] score on Box and Blocks testing to have increased functional ability to carry out fine motor tasks (fasteners, etc.) and more complex, coordinated IADLs (meal prep, sports, etc.).  Goal status: INITIAL  6.  Pt will decrease pain at worst from 8/10 to 3/10 or better to have better sleep and occupational participation in daily roles. Goal status: INITIAL  ASSESSMENT:  CLINICAL IMPRESSION: Patient is a 67 y.o. female who was seen today for occupational therapy evaluation for painful, stiff, sore, hypersensitive left elbow and decreased ability to perform daily activities and routines.  She will benefit from outpatient occupational therapy to decrease negative symptoms and increase quality of life.   PERFORMANCE DEFICITS: in functional skills including ADLs, IADLs, coordination, proprioception, sensation, edema, ROM, strength, pain, fascial restrictions, muscle spasms, flexibility, Gross motor control, body mechanics, endurance, decreased knowledge of precautions, skin integrity, and UE functional use, cognitive skills including  none , and psychosocial skills including coping strategies and habits.   IMPAIRMENTS: are limiting patient from ADLs, IADLs, rest and sleep, leisure, and social participation.   COMORBIDITIES: may have co-morbidities  that affects occupational performance. Patient will benefit from skilled OT to address above impairments and improve overall function.  MODIFICATION OR ASSISTANCE TO COMPLETE EVALUATION: No modification of tasks or assist necessary to complete an evaluation.  OT OCCUPATIONAL PROFILE AND HISTORY: Problem focused assessment: Including review of records relating to presenting problem.  CLINICAL DECISION MAKING: Moderate - several treatment options, min-mod task modification necessary  REHAB POTENTIAL: Excellent  EVALUATION COMPLEXITY: Low      PLAN:  OT FREQUENCY: 2x/week as able   OT DURATION: 6 weeks (through 07/09/23 as needed)   PLANNED  INTERVENTIONS: self care/ADL training, therapeutic exercise, therapeutic activity, neuromuscular re-education, manual therapy, scar mobilization, passive range of motion, splinting, compression bandaging, moist heat, cryotherapy, contrast bath, patient/family education, energy conservation,  coping strategies training, Re-evaluation, and Dry needling  RECOMMENDED OTHER SERVICES: none now   CONSULTED AND AGREED WITH PLAN OF CARE: Patient  PLAN FOR NEXT SESSION:   Review initial HEP and most important ideas of managing hypersensitivity with tolerable progressive desensitization, review scar mobilizations and try cupping, IASTM or other means.  Continue add stretches to the elbow as better tolerated. Add nerve gliding as tolerated for radial nerve    [name], OTR/L  05/27/2023, 6:09 PM

## 2023-05-26 ENCOUNTER — Ambulatory Visit (INDEPENDENT_AMBULATORY_CARE_PROVIDER_SITE_OTHER): Payer: BC Managed Care – PPO | Admitting: Orthopaedic Surgery

## 2023-05-26 DIAGNOSIS — M7022 Olecranon bursitis, left elbow: Secondary | ICD-10-CM

## 2023-05-26 DIAGNOSIS — M79622 Pain in left upper arm: Secondary | ICD-10-CM

## 2023-05-26 MED ORDER — TRIAMCINOLONE ACETONIDE 40 MG/ML IJ SUSP
80.0000 mg | INTRAMUSCULAR | Status: AC | PRN
Start: 2023-05-26 — End: 2023-05-26
  Administered 2023-05-26: 80 mg via INTRA_ARTICULAR

## 2023-05-26 MED ORDER — LIDOCAINE HCL 1 % IJ SOLN
4.0000 mL | INTRAMUSCULAR | Status: AC | PRN
Start: 2023-05-26 — End: 2023-05-26
  Administered 2023-05-26: 4 mL

## 2023-05-26 NOTE — Progress Notes (Signed)
Post Operative Evaluation    Procedure/Date of Surgery: left elbow I and D with bursa excision, triceps repair 7/9  Interval History:    Presents today status post left elbow bursectomy and triceps repair.  She is having persistent swelling and elbow pain dominantly about the incision.  PMH/PSH/Family History/Social History/Meds/Allergies:    Past Medical History:  Diagnosis Date  . Avascular necrosis of bone (HCC)    Left talus  . Blood transfusion   . Breast cancer (HCC) 2005   left  . Cancer (HCC)   . Dysplastic nevus 1988   right hip  . Fatty liver   . GERD (gastroesophageal reflux disease)   . History of breast cancer 2005   invasive ductal cancer left breast  . Hypertension   . Hypertriglyceridemia   . Irritable bowel syndrome   . Metabolic syndrome    resolved with diet and exercise  . NASH (nonalcoholic steatohepatitis)   . Nocturnal leg cramps 02/14/2015  . Osteopenia   . Palpitations 09/2012   normal echo  . Peripheral neuropathy   . Personal history of chemotherapy   . Personal history of radiation therapy   . Plantar fasciitis 03/2003  . PONV (postoperative nausea and vomiting)   . Tongue dysplasia   . Ulcer    Past Surgical History:  Procedure Laterality Date  . ANKLE SURGERY  07/2007   left, revascularization  . BREAST LUMPECTOMY  06/2004   left, with port placement  . CESAREAN SECTION  1991, 1993   x2  . COLONOSCOPY  09/2013   Dr. Loreta Ave  . DG GALL BLADDER    . IRRIGATION AND DEBRIDEMENT ELBOW Left 05/04/2023   Procedure: LEFT ELBOW IRRIGATION AND DEBRIDEMENT;  Surgeon: [name], MD;  Location: [facility];  Service: Orthopedics;  Laterality: Left;  . LAPAROSCOPIC CHOLECYSTECTOMY  2003  . MOUTH SURGERY  11/15   implant/crown placed 4/16  . SHOULDER ARTHROSCOPY  10/2005   right  . SHOULDER SURGERY  12/15   left  . TONGUE SURGERY  2012, 2013  . WRIST SURGERY  1988   right, DeQuervains    Social History   Socioeconomic History  . Marital status: Married    Spouse name: Not on file  . Number of children: 2  . Years of education: MD  . Highest education level: Not on file  Occupational History    Employer: [employer]  . Occupation: MD/Adjunct Professor  Tobacco Use  . Smoking status: Never  . Smokeless tobacco: Never  Vaping Use  . Vaping status: Never Used  Substance and Sexual Activity  . Alcohol use: Yes    Alcohol/week: 7.0 standard drinks of alcohol    Types: 7 Standard drinks or equivalent per week  . Drug use: No  . Sexual activity: Yes    Partners: Male    Birth control/protection: Post-menopausal    Comment: husband vasectomy  Other Topics Concern  . Not on file  Social History Narrative   Lives at home w/ husband and daughter   Retired OB/GYN physician   Patient is right handed.   Patient drinks 2 cups of caffeine daily.   Social Determinants of Health   Financial Resource Strain: Not on file  Food Insecurity: Not on file  Transportation Needs: Not on file  Physical Activity: Not  on file  Stress: Not on file  Social Connections: Not on file   Family History  Problem Relation Age of Onset  . Thyroid disease Mother   . Hyperlipidemia Mother   . Hypertension Mother   . Diabetes Mother   . Cancer Mother        melanoma  . Heart disease Mother   . Depression Mother   . Sleep apnea Mother   . Obesity Mother   . Heart disease Father   . Hyperlipidemia Father   . Hypertension Father   . Cancer Father        colon  . Prostate cancer Father   . Kidney disease Father   . Asthma Sister   . Asthma Brother   . Breast cancer Neg Hx    Allergies  Allergen Reactions  . Augmentin [Amoxicillin-Pot Clavulanate]     abd cramping  . Cephalexin     Profuse diarrhea  . Crestor [Rosuvastatin]     Liver function elevated    Current Outpatient Medications  Medication Sig Dispense Refill  . baclofen (LIORESAL) 10 MG tablet Take 0.5-1  tablets (5-10 mg total) by mouth at bedtime as needed for muscle spasms. 90 each 4  . clonazePAM (KLONOPIN) 1 MG tablet Take 1 tablet (1 mg total) by mouth at bedtime. 90 tablet 1  . Estradiol 10 MCG TABS vaginal tablet 1 tablet vaginally twice weekly 24 tablet 3  . famciclovir (FAMVIR) 500 MG tablet 3 tablets (1500mg ) at onset of fever blister symptoms 9 tablet 4  . famotidine (PEPCID) 10 MG tablet Take 10 mg by mouth daily.    Marland Kitchen ketoconazole (NIZORAL) 2 % shampoo SMARTSIG:5 Milliliter(s) Topical 3 Times a Week    . mometasone (ELOCON) 0.1 % ointment Apply topically twice weekly 45 g 1  . Multiple Vitamins-Minerals (MULTIVITAMIN PO) Take 1 tablet by mouth daily. With calcium    . nebivolol (BYSTOLIC) 5 MG tablet TAKE 1 TABLET BY MOUTH DAILY 90 tablet 1  . olmesartan (BENICAR) 5 MG tablet Take 2 tablets (10 mg total) by mouth daily. 180 tablet 1  . omega-3 acid ethyl esters (LOVAZA) 1 g capsule TAKE TWO CAPSULES BY MOUTH TWICE DAILY 360 capsule 1  . oxyCODONE (ROXICODONE) 5 MG immediate release tablet Take 1 tablet (5 mg total) by mouth every 4 (four) hours as needed for severe pain or breakthrough pain. 5 tablet 0  . Probiotic Product (ALIGN PO) Take 1 tablet by mouth daily.    . tirzepatide (ZEPBOUND) 5 MG/0.5ML Pen Inject 5 mg into the skin once a week.     No current facility-administered medications for this visit.   No results found.  Review of Systems:   A ROS was performed including pertinent positives and negatives as documented in the HPI.   Musculoskeletal Exam:    Left elbow with well healing wound. Range of motion is 10-130 degrees.  10 degrees loss of extension.  2+ RP.  Imaging:      I personally reviewed and interpreted the radiographs.   Assessment:   4 weeks status post left bursal I and D overall with persistent redness and pain around the incision.  Do believe that there is a component of capsulitis about the left elbow for which I recommend an intra-articular  ultrasound-guided elbow injection.  I do believe that a lot of her symptoms are from some type of variation of complex pain in the setting of previous neuropathy.  Given this I do believe that she  will need aggressive scar mobilization.  I have counseled on premedication with lidocaine or Biofreeze. Plan :    -Left elbow ultrasound-guided injection performed after verbal consent obtained    Procedure Note  Patient: Jessica Mack             Date of Birth: 05/13/1956           MRN: [id number]             Visit Date: 05/26/2023  Procedures: Visit Diagnoses: No diagnosis found.  Medium Joint Inj: L elbow on 05/26/2023 12:04 PM Indications: pain Details: 22 G 1.5 in needle, ultrasound-guided lateral approach Medications: 4 mL lidocaine 1 %; 80 mg triamcinolone acetonide 40 MG/ML Outcome: tolerated well, no immediate complications Consent was given by the patient. Immediately prior to procedure a time out was called to verify the correct patient, procedure, equipment, support staff and site/side marked as required. Patient was prepped and draped in the usual sterile fashion.         I personally saw and evaluated the patient, and participated in the management and treatment plan.  [name], MD Attending Physician, Orthopedic Surgery  This document was dictated using Dragon voice recognition software. A reasonable attempt at proof reading has been made to minimize errors.

## 2023-05-27 ENCOUNTER — Ambulatory Visit: Payer: BC Managed Care – PPO | Admitting: Rehabilitative and Restorative Service Providers"

## 2023-05-27 ENCOUNTER — Other Ambulatory Visit: Payer: Self-pay

## 2023-05-27 ENCOUNTER — Encounter: Payer: Self-pay | Admitting: Rehabilitative and Restorative Service Providers"

## 2023-05-27 DIAGNOSIS — R202 Paresthesia of skin: Secondary | ICD-10-CM

## 2023-05-27 DIAGNOSIS — M25522 Pain in left elbow: Secondary | ICD-10-CM | POA: Diagnosis not present

## 2023-05-27 DIAGNOSIS — M25612 Stiffness of left shoulder, not elsewhere classified: Secondary | ICD-10-CM | POA: Diagnosis not present

## 2023-05-27 DIAGNOSIS — R278 Other lack of coordination: Secondary | ICD-10-CM

## 2023-05-27 DIAGNOSIS — R600 Localized edema: Secondary | ICD-10-CM

## 2023-05-27 DIAGNOSIS — M6281 Muscle weakness (generalized): Secondary | ICD-10-CM | POA: Diagnosis not present

## 2023-05-27 DIAGNOSIS — M25622 Stiffness of left elbow, not elsewhere classified: Secondary | ICD-10-CM | POA: Diagnosis not present

## 2023-05-30 ENCOUNTER — Encounter (HOSPITAL_BASED_OUTPATIENT_CLINIC_OR_DEPARTMENT_OTHER): Payer: Self-pay | Admitting: Orthopaedic Surgery

## 2023-06-01 DIAGNOSIS — F4312 Post-traumatic stress disorder, chronic: Secondary | ICD-10-CM | POA: Diagnosis not present

## 2023-06-08 ENCOUNTER — Encounter: Payer: BC Managed Care – PPO | Admitting: Rehabilitative and Restorative Service Providers"

## 2023-06-08 DIAGNOSIS — F4312 Post-traumatic stress disorder, chronic: Secondary | ICD-10-CM | POA: Diagnosis not present

## 2023-06-10 ENCOUNTER — Encounter (HOSPITAL_BASED_OUTPATIENT_CLINIC_OR_DEPARTMENT_OTHER): Payer: BC Managed Care – PPO | Admitting: Orthopaedic Surgery

## 2023-06-10 ENCOUNTER — Encounter: Payer: Self-pay | Admitting: Occupational Therapy

## 2023-06-10 ENCOUNTER — Ambulatory Visit: Payer: BC Managed Care – PPO | Admitting: Occupational Therapy

## 2023-06-10 DIAGNOSIS — M6281 Muscle weakness (generalized): Secondary | ICD-10-CM

## 2023-06-10 DIAGNOSIS — M25522 Pain in left elbow: Secondary | ICD-10-CM

## 2023-06-10 DIAGNOSIS — M25612 Stiffness of left shoulder, not elsewhere classified: Secondary | ICD-10-CM

## 2023-06-10 DIAGNOSIS — M25622 Stiffness of left elbow, not elsewhere classified: Secondary | ICD-10-CM

## 2023-06-10 DIAGNOSIS — R278 Other lack of coordination: Secondary | ICD-10-CM

## 2023-06-10 DIAGNOSIS — R600 Localized edema: Secondary | ICD-10-CM

## 2023-06-10 DIAGNOSIS — R202 Paresthesia of skin: Secondary | ICD-10-CM

## 2023-06-10 NOTE — Therapy (Signed)
OUTPATIENT OCCUPATIONAL THERAPY ORTHO TREATMENT  Patient Name: Jessica Mack MRN: [id number] DOB:24-Oct-1956, 67 y.o., female Today's Date: 06/10/2023  PCP: Sanda Linger, MD REFERRING PROVIDER: [name], MD   END OF SESSION:  OT End of Session - 06/10/23 1058     Visit Number 2    Number of Visits 12    Date for OT Re-Evaluation 07/09/23    Authorization Type BCBS    OT Start Time 1059    OT Stop Time 1147    OT Time Calculation (min) 48 min    Activity Tolerance Patient tolerated treatment well;No increased pain;Patient limited by pain    Behavior During Therapy [facility] for tasks assessed/performed              Past Medical History:  Diagnosis Date   Avascular necrosis of bone (HCC)    Left talus   Blood transfusion    Breast cancer (HCC) 2005   left   Cancer (HCC)    Dysplastic nevus 1988   right hip   Fatty liver    GERD (gastroesophageal reflux disease)    History of breast cancer 2005   invasive ductal cancer left breast   Hypertension    Hypertriglyceridemia    Irritable bowel syndrome    Metabolic syndrome    resolved with diet and exercise   NASH (nonalcoholic steatohepatitis)    Nocturnal leg cramps 02/14/2015   Osteopenia    Palpitations 09/2012   normal echo   Peripheral neuropathy    Personal history of chemotherapy    Personal history of radiation therapy    Plantar fasciitis 03/2003   PONV (postoperative nausea and vomiting)    Tongue dysplasia    Ulcer    Past Surgical History:  Procedure Laterality Date   ANKLE SURGERY  07/2007   left, revascularization   BREAST LUMPECTOMY  06/2004   left, with port placement   CESAREAN SECTION  1991, 1993   x2   COLONOSCOPY  09/2013   Dr. Loreta Ave   DG GALL BLADDER     IRRIGATION AND DEBRIDEMENT ELBOW Left 05/04/2023   Procedure: LEFT ELBOW IRRIGATION AND DEBRIDEMENT;  Surgeon: [name], MD;  Location: [facility];  Service: Orthopedics;  Laterality: Left;   LAPAROSCOPIC  CHOLECYSTECTOMY  2003   MOUTH SURGERY  11/15   implant/crown placed 4/16   SHOULDER ARTHROSCOPY  10/2005   right   SHOULDER SURGERY  12/15   left   TONGUE SURGERY  2012, 2013   WRIST SURGERY  1988   right, DeQuervains   Patient Active Problem List   Diagnosis Date Noted   Triceps tendon rupture, left, initial encounter 05/04/2023   Olecranon bursitis of left elbow 05/04/2023   Elevated ferritin level 04/05/2023   Mild persistent asthma without complication 09/28/2022   NASH (nonalcoholic steatohepatitis) 06/04/2020   Hypertriglyceridemia    Routine general medical examination at a health care facility 02/20/2020   Essential hypertension 02/20/2020   Osteopenia 08/05/2014   Breast cancer of upper-outer quadrant of left female breast (HCC) 07/20/2013   Leukoplakia of oral mucosa, including tongue 07/26/2012   Essential and other specified forms of tremor 07/26/2012   Drug-induced polyneuropathy (HCC) 07/26/2012    ONSET DATE: 05/04/23 DOS elbow bursectomy and triceps repair   REFERRING DIAG: M70.22 (ICD-10-CM) - Olecranon bursitis of left elbow   THERAPY DIAG:  Pain in left elbow  Stiffness of left elbow, not elsewhere classified  Stiffness of left shoulder, not elsewhere classified  Muscle  weakness (generalized)  Paresthesia of skin  Other lack of coordination  Localized edema  Rationale for Evaluation and Treatment: Rehabilitation  SUBJECTIVE:   SUBJECTIVE STATEMENT: Pt is now 5 weeks post-op bursectomy surgery. She reports frustration with only being seen 1 time since DOS due to scheduling. She reports that sensitivity is getting better on scar and is using a vibrator 2-3 times a day. She is looking forward to beginning strengthening of R UE when able.             She has a 5 pound weight restriction that the doctor has listed in her orders.   PERTINENT HISTORY: Per referral: "ROM of L elbow and desensitivity therapy "  Per MD orders, she needs "aggressive" scar  mobilizations.  She has a history of an old wrist surgery, breast cancer treatments (radiation and chemo) that have left her with some peripheral neuropathy in feet and fingers- treatments were about 13 years ago.  She states retiring due to this paresthesia and pain in her hands and decreased fine motor skills  PRECAUTIONS: None  RED FLAGS: None   WEIGHT BEARING RESTRICTIONS: Yes ROM as tolerated with 5# restriction per MD notes   PAIN:  Are you having pain? Yes, 1/10 and at it's worst, it is 3/10 NPRS scale Pain location: Left olecranon/surgical area Pain description: Burning, stabbing, tight Aggravating factors: Pushing more aggressively moving quickly Relieving factors: Rest  FALLS: Has patient fallen in last 6 months? Yes. Number of falls 1 -this accident.  Not considered a fall risk.  LIVING ENVIRONMENT: Lives with: lives with their family Has following equipment at home: None  PLOF: Independent  PATIENT GOALS: To decrease pain and increase ability in motion and strength in the left elbow for daily activities  NEXT MD VISIT: 06/10/23   OBJECTIVE: (All objective assessments below are from initial evaluation on: 05/27/23 unless otherwise specified.)   HAND DOMINANCE: Right   ADLs: Overall ADLs: States decreased ability to grab, hold household objects, pain and inability to open containers, use knife to cut, mild to moderate bathing problems as well.    FUNCTIONAL OUTCOME MEASURES: Eval: Quck DASH 47% impairment today  (Higher % Score  =  More Impairment)     UPPER EXTREMITY ROM     Shoulder to Wrist AROM Left eval  Shoulder flexion 140 (160 Rt)   Shoulder abduction   Shoulder extension   Shoulder internal rotation   Shoulder external rotation   Elbow flexion 117  Elbow extension (-7)  Forearm supination 79  Forearm pronation  80  Wrist flexion WNL  Wrist extension WNL  (Blank rows = not tested)   Hand AROM Left eval  Full Fist Ability (or Gap to Distal  Palmar Crease) full  Thumb Opposition  (Kapandji Scale)  WNL  (Blank rows = not tested)   UPPER EXTREMITY MMT:    Eval: Based on observations and limited range of motion, her strength would be rated 3-/5 MMT for elbow flexion and extension today, no resistance was applied due to 5 pound weight restriction.  Will be tested in detail once she is cleared by the doctor in about 2 weeks.  MMT Left TBD  Shoulder flexion   Shoulder abduction   Shoulder adduction   Shoulder extension   Shoulder internal rotation   Shoulder external rotation   Middle trapezius   Lower trapezius   Elbow flexion   Elbow extension   Forearm supination   Forearm pronation   Wrist flexion  Wrist extension   Wrist ulnar deviation   Wrist radial deviation   (Blank rows = not tested)  HAND FUNCTION: Eval: Observed weakness in affected hand.  Grip strength Right: 66 lbs, Left: 37 lbs   COORDINATION: Eval: Observed coordination impairments with affected Lt arm, seen by inability to fully move the elbow and shoulder.  Details will be tested TBD. Box and Blocks Test: TBD Blocks today   SENSATION: Eval: Around the left elbow surgical area, she has hypersensitivity of her scar/nerves, does not tolerate light touch well, signs of allodynia.   She has a history of paresthesia in the hands and the feet that she states can feel very painful to her (chronic from 13 years ago breast cancer treatments).  EDEMA:   Eval:  Mildly swollen in Lt elbow today  COGNITION: Eval: Overall cognitive status: WFL for evaluation today   OBSERVATIONS:   Eval: Her surgical area is pink and closed, looks mildly irritated and has hypersensitivity to touch but no other signs of infection.  Diffuse swelling but no significant focal swelling.  She has limited strength and range of motion that is largely due to inhibiting pains and likely some mild scar adhesions.  Overall she presents as allodynia and hypersensitivity to her formerly  infected left elbow and surgical area, mild to moderate scar adhesions around the elbow, stiffness pain and weakness to the left elbow that does travel up to the triceps and she does have some stiffness through the left shoulder as well.   TODAY'S TREATMENT:  06/10/23: Pt reports slight decreased sensitivity and has been using vibrator on scar 2-3 time/day at home. She continues to have sensitivity with certain materials, most notably a silk shirt that she was unable to tolerate wearing last Sunday. Desensitization techniques via gentle cross friction massage and use of vibrator was performed x10 min by therapist. She was issued various materials (soft velcro, mole padding/strapping etc) to add to her home desensitization program as well as to use items she has already (examples were discussed and reviewed verbally in the clinic). She reports that she has been using 1# and 2# weight for some pilates via her instructor, but she is not using her full body weight due to current weight restrictions. Her HEP was reviewed and performed in the clinic today in it's entirety w/o resistance, she then completed "Raise the Roof" 10 reps, Bend and Straighten Elbow 10 reps, Palm Up / Palm Down using a 2# weight x10 reps each w/o c/o pain or weakness but a stretch. Her A/ROM for elbow flexion and extension has improved overall since her last visit as she is now able to touch/wash her face, reach to her shoulder and behind her head with her left hand. Verbally reviewed to not lift more than 5# due to precautions and weight restrictions, she will f/u with her MD tomorrow to see when this can be progressed. A new piece of slightly compressive Tubigrip sleeve was given which she states feels better, she will use PRN during the day and noc.   05/27/23: OT educates on the importance of touching around her sensitive area for desensitization that should be done progressively and systematically.  She is given handouts about this and  it is explained in detail-including the pain gate theory, the use of vibration, the use of light touch that is progressive in nature, to be done 4-6 times a day as tolerated for 2 minutes at a time.  She was given a slightly compressive Tubigrip  sleeve which she states feels better, and also will help her sleep at night.  It is not as compressive or aggressive as a neoprene sleeve.  Next she was also educated on the importance of scar mobilization to be done to shift the top layers of her skin to allow freedom of motion.  She is given nonslip blue Dycem to help grab her skin to push and pull in 3 different positions-horizontal, diagonal, circular.  She should do these things at least 4 times a day for 2 minutes at a time.  Neck she was also given active range of motion with slight overpressure at the shoulder, elbow and forearm.  She states feeling relatively nonpainful pressures while performing this today for review of understanding.  She was asked to do these things frequently but not very painfully at this time.  She states understanding and feels better by the end of the session.  ExercisesAccess Code: [email or web address] URL: [email or web address] Date: 06/10/2023 Prepared by: Mariam Dollar  Patient Education - Scar Massage - "Raise the Roof"   - 4-6 x daily - 1-2 sets - 5-10 reps - Bend and Straighten Elbow  - 3-4 x daily - 1-2 sets - 10-15 reps - Palm Up / Palm Down  - 3-4 x daily - 1-2 sets - 10-15 reps -Wrist ext stretches with supination and elbow extension, 5x 15sec, 4-6 x day Patient Education - Scar Massage/mobilizations & systematic desensitization for neuromuscular rehabilitation.   PATIENT EDUCATION: Education details: See tx section above for details  Person educated: Patient Education method: Verbal Instruction, Teach back, Handouts  Education comprehension: States and demonstrates understanding, Additional Education required    HOME EXERCISE PROGRAM: Access Code:  [email or web address] URL: [email or web address] Date: 05/27/2023 Prepared by: [name]   GOALS: Goals reviewed with patient? Yes   SHORT TERM GOALS: (STG required if POC>30 days) Target Date: 06/11/23  Pt will obtain protective, custom orthotic. Goal status: TBD/PRN  2.  Pt will demo/state understanding of initial HEP to improve pain levels and prerequisite motion. Goal status: INITIAL   LONG TERM GOALS: Target Date: 07/09/23  Pt will improve functional ability by decreased impairment per Quick DASH assessment from 47% to 15% or better, for better quality of life. Goal status: INITIAL  2.  Pt will improve grip strength in Lt hand from 37lbs to at least 55lbs for functional use at home and in IADLs. Goal status: INITIAL  3.  Pt will improve A/ROM in Lt elbow flex/ext from 117 / (-7)  to at least 140 / (-5), to have functional motion for tasks like reach and grasp.  Goal status: INITIAL  4.  Pt will improve strength in Lt elbow flex and ext from painful 3-/5 MMT to at least 4+/5 MMT to have increased functional ability to carry out selfcare and higher-level homecare tasks with no difficulty. Goal status: INITIAL  5.  Pt will improve coordination skills in Lt arm, as seen by [facility] score on Box and Blocks testing to have increased functional ability to carry out fine motor tasks (fasteners, etc.) and more complex, coordinated IADLs (meal prep, sports, etc.).  Goal status: INITIAL  6.  Pt will decrease pain at worst from 8/10 to 3/10 or better to have better sleep and occupational participation in daily roles. Goal status: INITIAL  ASSESSMENT:  CLINICAL IMPRESSION: Pt is demonstrating imporved A/ROM to left elbow and is also tolerating increased desensitization to left elbow scar/surgical area. She should benefit from continued out-pt occupational therapy to assist with  increased A/ROM, scar mobilization, desensitization and strengthening as per MD orders. She is currently  at a 5# weight restriction and will f/u with her MD at her next visit to see when this can be progressed.  PERFORMANCE DEFICITS: in functional skills including ADLs, IADLs, coordination, proprioception, sensation, edema, ROM, strength, pain, fascial restrictions, muscle spasms, flexibility, Gross motor control, body mechanics, endurance, decreased knowledge of precautions, skin integrity, and UE functional use, cognitive skills including  none , and psychosocial skills including coping strategies and habits.   IMPAIRMENTS: are limiting patient from ADLs, IADLs, rest and sleep, leisure, and social participation.   COMORBIDITIES: may have co-morbidities  that affects occupational performance. Patient will benefit from skilled OT to address above impairments and improve overall function.  MODIFICATION OR ASSISTANCE TO COMPLETE EVALUATION: No modification of tasks or assist necessary to complete an evaluation.  OT OCCUPATIONAL PROFILE AND HISTORY: Problem focused assessment: Including review of records relating to presenting problem.  CLINICAL DECISION MAKING: Moderate - several treatment options, min-mod task modification necessary  REHAB POTENTIAL: Excellent  EVALUATION COMPLEXITY: Low      PLAN:  OT FREQUENCY: 2x/week as able   OT DURATION: 6 weeks (through 07/09/23 as needed)   PLANNED INTERVENTIONS: self care/ADL training, therapeutic exercise, therapeutic activity, neuromuscular re-education, manual therapy, scar mobilization, passive range of motion, splinting, compression bandaging, moist heat, cryotherapy, contrast bath, patient/family education, energy conservation, coping strategies training, Re-evaluation, and Dry needling  RECOMMENDED OTHER SERVICES: none now   CONSULTED AND AGREED WITH PLAN OF CARE: Patient  PLAN FOR NEXT SESSION:    Review and upgrade HEP as able, increase resistance as per MD for LUE strengthening, managing hypersensitivity with tolerable progressive  desensitization, review scar mobilizations. Consider cupping, IASTM or other means.  Continue add stretches to the elbow as better tolerated. Add nerve gliding as tolerated for radial nerve.   Mariam Dollar Beth Dixon, OTR/L  06/10/2023, 12:23 PM

## 2023-06-11 ENCOUNTER — Ambulatory Visit (INDEPENDENT_AMBULATORY_CARE_PROVIDER_SITE_OTHER): Payer: BC Managed Care – PPO | Admitting: Orthopaedic Surgery

## 2023-06-11 DIAGNOSIS — M7022 Olecranon bursitis, left elbow: Secondary | ICD-10-CM

## 2023-06-11 NOTE — Progress Notes (Signed)
Post Operative Evaluation    Procedure/Date of Surgery: left elbow I and D with bursa excision, triceps repair 7/9  Interval History:    Presents today status post left elbow bursectomy and triceps repair.  Significant gains with physical therapy and mobilization at the left elbow.  She has been progressively weightbearing on the left which is going well. PMH/PSH/Family History/Social History/Meds/Allergies:    Past Medical History:  Diagnosis Date   Avascular necrosis of bone (HCC)    Left talus   Blood transfusion    Breast cancer (HCC) 2005   left   Cancer (HCC)    Dysplastic nevus 1988   right hip   Fatty liver    GERD (gastroesophageal reflux disease)    History of breast cancer 2005   invasive ductal cancer left breast   Hypertension    Hypertriglyceridemia    Irritable bowel syndrome    Metabolic syndrome    resolved with diet and exercise   NASH (nonalcoholic steatohepatitis)    Nocturnal leg cramps 02/14/2015   Osteopenia    Palpitations 09/2012   normal echo   Peripheral neuropathy    Personal history of chemotherapy    Personal history of radiation therapy    Plantar fasciitis 03/2003   PONV (postoperative nausea and vomiting)    Tongue dysplasia    Ulcer    Past Surgical History:  Procedure Laterality Date   ANKLE SURGERY  07/2007   left, revascularization   BREAST LUMPECTOMY  06/2004   left, with port placement   CESAREAN SECTION  1991, 1993   x2   COLONOSCOPY  09/2013   Dr. Loreta Ave   DG GALL BLADDER     IRRIGATION AND DEBRIDEMENT ELBOW Left 05/04/2023   Procedure: LEFT ELBOW IRRIGATION AND DEBRIDEMENT;  Surgeon: [name], MD;  Location: [facility];  Service: Orthopedics;  Laterality: Left;   LAPAROSCOPIC CHOLECYSTECTOMY  2003   MOUTH SURGERY  11/15   implant/crown placed 4/16   SHOULDER ARTHROSCOPY  10/2005   right   SHOULDER SURGERY  12/15   left   TONGUE SURGERY  2012, 2013   WRIST  SURGERY  1988   right, DeQuervains   Social History   Socioeconomic History   Marital status: Married    Spouse name: Not on file   Number of children: 2   Years of education: MD   Highest education level: Not on file  Occupational History    Employer: [employer]   Occupation: MD/Adjunct Professor  Tobacco Use   Smoking status: Never   Smokeless tobacco: Never  Vaping Use   Vaping status: Never Used  Substance and Sexual Activity   Alcohol use: Yes    Alcohol/week: 7.0 standard drinks of alcohol    Types: 7 Standard drinks or equivalent per week   Drug use: No   Sexual activity: Yes    Partners: Male    Birth control/protection: Post-menopausal    Comment: husband vasectomy  Other Topics Concern   Not on file  Social History Narrative   Lives at home w/ husband and daughter   Retired OB/GYN physician   Patient is right handed.   Patient drinks 2 cups of caffeine daily.   Social Determinants of Health   Financial Resource Strain: Not on file  Food Insecurity: Not on  file  Transportation Needs: Not on file  Physical Activity: Not on file  Stress: Not on file  Social Connections: Not on file   Family History  Problem Relation Age of Onset   Thyroid disease Mother    Hyperlipidemia Mother    Hypertension Mother    Diabetes Mother    Cancer Mother        melanoma   Heart disease Mother    Depression Mother    Sleep apnea Mother    Obesity Mother    Heart disease Father    Hyperlipidemia Father    Hypertension Father    Cancer Father        colon   Prostate cancer Father    Kidney disease Father    Asthma Sister    Asthma Brother    Breast cancer Neg Hx    Allergies  Allergen Reactions   Augmentin [Amoxicillin-Pot Clavulanate]     abd cramping   Cephalexin     Profuse diarrhea   Crestor [Rosuvastatin]     Liver function elevated    Current Outpatient Medications  Medication Sig Dispense Refill   baclofen (LIORESAL) 10 MG tablet Take 0.5-1  tablets (5-10 mg total) by mouth at bedtime as needed for muscle spasms. 90 each 4   clonazePAM (KLONOPIN) 1 MG tablet Take 1 tablet (1 mg total) by mouth at bedtime. 90 tablet 1   Estradiol 10 MCG TABS vaginal tablet 1 tablet vaginally twice weekly 24 tablet 3   famciclovir (FAMVIR) 500 MG tablet 3 tablets (1500mg ) at onset of fever blister symptoms 9 tablet 4   famotidine (PEPCID) 10 MG tablet Take 10 mg by mouth daily.     ketoconazole (NIZORAL) 2 % shampoo SMARTSIG:5 Milliliter(s) Topical 3 Times a Week     mometasone (ELOCON) 0.1 % ointment Apply topically twice weekly 45 g 1   Multiple Vitamins-Minerals (MULTIVITAMIN PO) Take 1 tablet by mouth daily. With calcium     nebivolol (BYSTOLIC) 5 MG tablet TAKE 1 TABLET BY MOUTH DAILY 90 tablet 1   olmesartan (BENICAR) 5 MG tablet Take 2 tablets (10 mg total) by mouth daily. 180 tablet 1   omega-3 acid ethyl esters (LOVAZA) 1 g capsule TAKE TWO CAPSULES BY MOUTH TWICE DAILY 360 capsule 1   oxyCODONE (ROXICODONE) 5 MG immediate release tablet Take 1 tablet (5 mg total) by mouth every 4 (four) hours as needed for severe pain or breakthrough pain. (Patient not taking: Reported on 05/27/2023) 5 tablet 0   Probiotic Product (ALIGN PO) Take 1 tablet by mouth daily.     tirzepatide (ZEPBOUND) 5 MG/0.5ML Pen Inject 5 mg into the skin once a week.     No current facility-administered medications for this visit.   No results found.  Review of Systems:   A ROS was performed including pertinent positives and negatives as documented in the HPI.   Musculoskeletal Exam:    Left elbow with well healing wound. Range of motion is 0-130 degrees.    2+ RP.  Imaging:      I personally reviewed and interpreted the radiographs.   Assessment:   6 weeks status post left bursal I and D overall improved today.  She is continuing to work on mobilization of the wound which is going well.  She will continue to progress weightbearing and I have advised she can lift  up to 10 pounds.  I will plan to see her back in 2 months for final check Plan :    -  Return to clinic 2 months for final check    I personally saw and evaluated the patient, and participated in the management and treatment plan.  [name], MD Attending Physician, Orthopedic Surgery  This document was dictated using Dragon voice recognition software. A reasonable attempt at proof reading has been made to minimize errors.

## 2023-06-15 ENCOUNTER — Ambulatory Visit: Payer: BC Managed Care – PPO | Admitting: Occupational Therapy

## 2023-06-15 ENCOUNTER — Encounter: Payer: Self-pay | Admitting: Occupational Therapy

## 2023-06-15 DIAGNOSIS — M25522 Pain in left elbow: Secondary | ICD-10-CM

## 2023-06-15 DIAGNOSIS — M6281 Muscle weakness (generalized): Secondary | ICD-10-CM

## 2023-06-15 DIAGNOSIS — F4312 Post-traumatic stress disorder, chronic: Secondary | ICD-10-CM | POA: Diagnosis not present

## 2023-06-15 DIAGNOSIS — R202 Paresthesia of skin: Secondary | ICD-10-CM

## 2023-06-15 DIAGNOSIS — R600 Localized edema: Secondary | ICD-10-CM

## 2023-06-15 DIAGNOSIS — M25612 Stiffness of left shoulder, not elsewhere classified: Secondary | ICD-10-CM

## 2023-06-15 DIAGNOSIS — M25622 Stiffness of left elbow, not elsewhere classified: Secondary | ICD-10-CM | POA: Diagnosis not present

## 2023-06-15 DIAGNOSIS — R278 Other lack of coordination: Secondary | ICD-10-CM

## 2023-06-15 NOTE — Therapy (Signed)
OUTPATIENT OCCUPATIONAL THERAPY ORTHO TREATMENT  Patient Name: Jessica Mack MRN: [id number] DOB:[birth year], 67 y.o., female Today's Date: 06/15/2023  PCP: Sanda Linger, MD REFERRING PROVIDER: [name], MD   END OF SESSION:  OT End of Session - 06/15/23 1013     Visit Number 3    Number of Visits 12    Date for OT Re-Evaluation 07/09/23    Authorization Type BCBS    OT Start Time 1013    OT Stop Time 1056    OT Time Calculation (min) 43 min    Activity Tolerance Patient tolerated treatment well;No increased pain;Patient limited by pain    Behavior During Therapy [facility] for tasks assessed/performed               Past Medical History:  Diagnosis Date   Avascular necrosis of bone (HCC)    Left talus   Blood transfusion    Breast cancer (HCC) 2005   left   Cancer (HCC)    Dysplastic nevus 1988   right hip   Fatty liver    GERD (gastroesophageal reflux disease)    History of breast cancer 2005   invasive ductal cancer left breast   Hypertension    Hypertriglyceridemia    Irritable bowel syndrome    Metabolic syndrome    resolved with diet and exercise   NASH (nonalcoholic steatohepatitis)    Nocturnal leg cramps 02/14/2015   Osteopenia    Palpitations 09/2012   normal echo   Peripheral neuropathy    Personal history of chemotherapy    Personal history of radiation therapy    Plantar fasciitis 03/2003   PONV (postoperative nausea and vomiting)    Tongue dysplasia    Ulcer    Past Surgical History:  Procedure Laterality Date   ANKLE SURGERY  07/2007   left, revascularization   BREAST LUMPECTOMY  06/2004   left, with port placement   CESAREAN SECTION  1991, 1993   x2   COLONOSCOPY  09/2013   Dr. Loreta Ave   DG GALL BLADDER     IRRIGATION AND DEBRIDEMENT ELBOW Left 05/04/2023   Procedure: LEFT ELBOW IRRIGATION AND DEBRIDEMENT;  Surgeon: [name], MD;  Location: [facility];  Service: Orthopedics;  Laterality: Left;   LAPAROSCOPIC  CHOLECYSTECTOMY  2003   MOUTH SURGERY  11/15   implant/crown placed 4/16   SHOULDER ARTHROSCOPY  10/2005   right   SHOULDER SURGERY  12/15   left   TONGUE SURGERY  2012, 2013   WRIST SURGERY  1988   right, DeQuervains   Patient Active Problem List   Diagnosis Date Noted   Triceps tendon rupture, left, initial encounter 05/04/2023   Olecranon bursitis of left elbow 05/04/2023   Elevated ferritin level 04/05/2023   Mild persistent asthma without complication 09/28/2022   NASH (nonalcoholic steatohepatitis) 06/04/2020   Hypertriglyceridemia    Routine general medical examination at a health care facility 02/20/2020   Essential hypertension 02/20/2020   Osteopenia 08/05/2014   Breast cancer of upper-outer quadrant of left female breast (HCC) 07/20/2013   Leukoplakia of oral mucosa, including tongue 07/26/2012   Essential and other specified forms of tremor 07/26/2012   Drug-induced polyneuropathy (HCC) 07/26/2012    ONSET DATE: 05/04/23 DOS elbow bursectomy and triceps repair   REFERRING DIAG: M70.22 (ICD-10-CM) - Olecranon bursitis of left elbow   THERAPY DIAG:  Pain in left elbow  Stiffness of left elbow, not elsewhere classified  Stiffness of left shoulder, not elsewhere classified  Muscle weakness (generalized)  Paresthesia of skin  Other lack of coordination  Localized edema  Rationale for Evaluation and Treatment: Rehabilitation  SUBJECTIVE:   SUBJECTIVE STATEMENT: Pt is now ~6 weeks post-op bursectomy surgery. She saw [name], MD on 06/11/23 whom stated that she "will continue to progress weightbearing and I have advised she can lift up to 10 pounds." Pt reports that she had a family reunion last weekend and she didn't do "my exercises like I should", however, pt was quite busy and using her L UE functionally. Pt reports that she hit her elbow on a chair yesterday and her scar has been quite sensitive since.   PERTINENT HISTORY: Per MD note 06/11/23:  Assessment "She will continue to progress weightbearing and I have advised she can lift up to 10 pounds"  Per referral: "ROM of L elbow and desensitivity therapy "  Per MD orders, she needs "aggressive" scar mobilizations.  She has a history of an old wrist surgery, breast cancer treatments (radiation and chemo) that have left her with some peripheral neuropathy in feet and fingers- treatments were about 13 years ago.  She states retiring due to this paresthesia and pain in her hands and decreased fine motor skills  PRECAUTIONS: None  RED FLAGS: None   WEIGHT BEARING RESTRICTIONS: Yes ROM as tolerated with 10# restriction per MD notes   PAIN:  Are you having pain? Yes, 3/10 "after banging it yesterday" NPRS scale Pain location: Left olecranon/surgical area Pain description: Burning, stabbing, tight Aggravating factors: Pushing more aggressively moving quickly Relieving factors: Rest  FALLS: Has patient fallen in last 6 months? Yes. Number of falls 1 -this accident.  Not considered a fall risk.  LIVING ENVIRONMENT: Lives with: lives with their family Has following equipment at home: None  PLOF: Independent  PATIENT GOALS: To decrease pain and increase ability in motion and strength in the left elbow for daily activities  NEXT MD VISIT: 06/11/23   OBJECTIVE: (All objective assessments below are from initial evaluation on: 05/27/23 unless otherwise specified.)   HAND DOMINANCE: Right   ADLs: Overall ADLs: States decreased ability to grab, hold household objects, pain and inability to open containers, use knife to cut, mild to moderate bathing problems as well.    FUNCTIONAL OUTCOME MEASURES: Eval: Quck DASH 47% impairment today  (Higher % Score  =  More Impairment)     UPPER EXTREMITY ROM     Shoulder to Wrist AROM Left eval  Shoulder flexion 140 (160 Rt)   Shoulder abduction   Shoulder extension   Shoulder internal rotation   Shoulder external rotation   Elbow flexion  117  Elbow extension (-7)  Forearm supination 79  Forearm pronation  80  Wrist flexion WNL  Wrist extension WNL  (Blank rows = not tested)   Hand AROM Left eval  Full Fist Ability (or Gap to Distal Palmar Crease) full  Thumb Opposition  (Kapandji Scale)  WNL  (Blank rows = not tested)   UPPER EXTREMITY MMT:    Eval: Based on observations and limited range of motion, her strength would be rated 3-/5 MMT for elbow flexion and extension today, no resistance was applied due to 5 pound weight restriction.  Will be tested in detail once she is cleared by the doctor in about 2 weeks.  MMT Left TBD  Shoulder flexion   Shoulder abduction   Shoulder adduction   Shoulder extension   Shoulder internal rotation   Shoulder external rotation   Middle  trapezius   Lower trapezius   Elbow flexion   Elbow extension   Forearm supination   Forearm pronation   Wrist flexion   Wrist extension   Wrist ulnar deviation   Wrist radial deviation   (Blank rows = not tested)  HAND FUNCTION: Eval: Observed weakness in affected hand.  Grip strength Right: 66 lbs, Left: 37 lbs   COORDINATION: Eval: Observed coordination impairments with affected Lt arm, seen by inability to fully move the elbow and shoulder.  Details will be tested TBD. Box and Blocks Test: TBD Blocks today   SENSATION: Eval: Around the left elbow surgical area, she has hypersensitivity of her scar/nerves, does not tolerate light touch well, signs of allodynia.   She has a history of paresthesia in the hands and the feet that she states can feel very painful to her (chronic from 13 years ago breast cancer treatments).  EDEMA:   Eval:  Mildly swollen in Lt elbow today  COGNITION: Eval: Overall cognitive status: WFL for evaluation today   OBSERVATIONS:   Eval: Her surgical area is pink and closed, looks mildly irritated and has hypersensitivity to touch but no other signs of infection.  Diffuse swelling but no significant  focal swelling.  She has limited strength and range of motion that is largely due to inhibiting pains and likely some mild scar adhesions.  Overall she presents as allodynia and hypersensitivity to her formerly infected left elbow and surgical area, mild to moderate scar adhesions around the elbow, stiffness pain and weakness to the left elbow that does travel up to the triceps and she does have some stiffness through the left shoulder as well.   TODAY'S TREATMENT:  06/15/23: UBE x8 min rotating forward and back with bilateral UE's for reciprocal A/ROM and stretch throughout her Lt UE, pt was noted to maintain rpm of 68-76 throughout this time. Scar management/mobilization and desensitization techniques x10 min with vibrator and without via therapist. Pt is TTP at distal end of scar noted and stated that this may be where she "banged it against a chair" yesterday. We discussed trying cryotherapy PRN in this area over the next few days. Upgraded HEP to add radial nerve gliding exercises (reviewed and performed in clinic, issued handout from Rehabilitation of the Hand and Upper Extremity 5th edition) to assist with improving the ability of the nerve to move freely among left elbow/UE, as well as to reduce pain and paresthesias. It was noted that pt reports that she did not feel much stretch if any when performing radial nerve glides. Review and performance of HEP with focus on bolded exercises as below (see Med[employer] HEP), increased resistance to 3#. Pt tolerated treatment well this date.  06/10/23: Pt reports slight decreased sensitivity and has been using vibrator on scar 2-3 time/day at home. She continues to have sensitivity with certain materials, most notably a silk shirt that she was unable to tolerate wearing last Sunday. Desensitization techniques via gentle cross friction massage and use of vibrator was performed x10 min by therapist. She was issued various materials (soft velcro, mole  padding/strapping etc) to add to her home desensitization program as well as to use items she has already (examples were discussed and reviewed verbally in the clinic). She reports that she has been using 1# and 2# weight for some pilates via her instructor, but she is not using her full body weight due to current weight restrictions. Her HEP was reviewed and performed in the clinic today in it's  entirety w/o resistance, she then completed "Raise the Roof" 10 reps, Bend and Straighten Elbow 10 reps, Palm Up / Palm Down using a 2# weight x10 reps each w/o c/o pain or weakness but a stretch. Her A/ROM for elbow flexion and extension has improved overall since her last visit as she is now able to touch/wash her face, reach to her shoulder and behind her head with her left hand. Verbally reviewed to not lift more than 5# due to precautions and weight restrictions, she will f/u with her MD tomorrow to see when this can be progressed. A new piece of slightly compressive Tubigrip sleeve was given which she states feels better, she will use PRN during the day and noc.   05/27/23: OT educates on the importance of touching around her sensitive area for desensitization that should be done progressively and systematically.  She is given handouts about this and it is explained in detail-including the pain gate theory, the use of vibration, the use of light touch that is progressive in nature, to be done 4-6 times a day as tolerated for 2 minutes at a time.  She was given a slightly compressive Tubigrip sleeve which she states feels better, and also will help her sleep at night.  It is not as compressive or aggressive as a neoprene sleeve.  Next she was also educated on the importance of scar mobilization to be done to shift the top layers of her skin to allow freedom of motion.  She is given nonslip blue Dycem to help grab her skin to push and pull in 3 different positions-horizontal, diagonal, circular.  She should do these  things at least 4 times a day for 2 minutes at a time.  Neck she was also given active range of motion with slight overpressure at the shoulder, elbow and forearm.  She states feeling relatively nonpainful pressures while performing this today for review of understanding.  She was asked to do these things frequently but not very painfully at this time.  She states understanding and feels better by the end of the session.  ExercisesAccess Code: [email or web address] URL: [email or web address] Date: 06/10/2023 Prepared by: Mariam Dollar  Patient Education - Scar Massage - "Raise the Roof"  with 3# weight - 4-6 x daily - 1-2 sets - 5-10 reps - Bend and Straighten Elbow  - 3-4 x daily - 1-2 sets - 10-15 reps - Palm Up / Palm Down  - 3-4 x daily - 1-2 sets - 10-15 reps with 3# weight -Wrist ext stretches with supination and elbow extension, 5x15sec, 4-6 x day Patient Education - Scar Massage/mobilizations & systematic desensitization for neuromuscular rehabilitation.   PATIENT EDUCATION: Education details: See tx section above for details  Person educated: Patient Education method: Verbal Instruction, Teach back, Handouts  Education comprehension: States and demonstrates understanding, Additional Education required    HOME EXERCISE PROGRAM: Access Code: [email or web address] URL: [email or web address] Date: 05/27/2023 Prepared by: [name]   GOALS: Goals reviewed with patient? Yes   SHORT TERM GOALS: (STG required if POC>30 days) Target Date: 06/11/23  Pt will obtain protective, custom orthotic. Goal status: TBD/PRN  2.  Pt will demo/state understanding of initial HEP to improve pain levels and prerequisite motion. Goal status: Met 06/10/23   LONG TERM GOALS: Target Date: 07/09/23  Pt will improve functional ability by decreased impairment per Quick DASH assessment from 47% to 15% or better, for better quality of life. Goal status: INITIAL  2.  Pt will improve grip  strength  in Lt hand from 37lbs to at least 55lbs for functional use at home and in IADLs. Goal status: 06/15/23: 53.6# Lt, 72.5# Rt  3.  Pt will improve A/ROM in Lt elbow flex/ext from 117 / (-7)  to at least 140 / (-5), to have functional motion for tasks like reach and grasp.  Goal status: INITIAL  4.  Pt will improve strength in Lt elbow flex and ext from painful 3-/5 MMT to at least 4+/5 MMT to have increased functional ability to carry out selfcare and higher-level homecare tasks with no difficulty. Goal status: INITIAL  5.  Pt will improve coordination skills in Lt arm, as seen by [facility] score on Box and Blocks testing to have increased functional ability to carry out fine motor tasks (fasteners, etc.) and more complex, coordinated IADLs (meal prep, sports, etc.).  Goal status: INITIAL  6.  Pt will decrease pain at worst from 8/10 to 3/10 or better to have better sleep and occupational participation in daily roles. Goal status: INITIAL  ASSESSMENT:  CLINICAL IMPRESSION: 06/15/23 Pt has made nice improvements in A/ROM of L UE/elbow. She is hypersensitive and TTP along her distal scar after "banging it on a chair yesterday". She may benefit from cryotherapy PRN over next several days. She will continue desensitization techniques along her scar. She may benefit from assessment and upgrade of her HEP/resistance as tolerated and putty next visit.  She should benefit from continued out-pt occupational therapy to assist with increased A/ROM, scar mobilization, desensitization and strengthening as per MD orders. She is currently at a 10# weight restriction following an MD visit on 06/11/23.  PERFORMANCE DEFICITS: in functional skills including ADLs, IADLs, coordination, proprioception, sensation, edema, ROM, strength, pain, fascial restrictions, muscle spasms, flexibility, Gross motor control, body mechanics, endurance, decreased knowledge of precautions, skin integrity, and UE functional use, cognitive  skills including  none , and psychosocial skills including coping strategies and habits.   IMPAIRMENTS: are limiting patient from ADLs, IADLs, rest and sleep, leisure, and social participation.   COMORBIDITIES: may have co-morbidities  that affects occupational performance. Patient will benefit from skilled OT to address above impairments and improve overall function.  MODIFICATION OR ASSISTANCE TO COMPLETE EVALUATION: No modification of tasks or assist necessary to complete an evaluation.  OT OCCUPATIONAL PROFILE AND HISTORY: Problem focused assessment: Including review of records relating to presenting problem.  CLINICAL DECISION MAKING: Moderate - several treatment options, min-mod task modification necessary  REHAB POTENTIAL: Excellent  EVALUATION COMPLEXITY: Low      PLAN:  OT FREQUENCY: 2x/week as able   OT DURATION: 6 weeks (through 07/09/23 as needed)   PLANNED INTERVENTIONS: self care/ADL training, therapeutic exercise, therapeutic activity, neuromuscular re-education, manual therapy, scar mobilization, passive range of motion, splinting, compression bandaging, moist heat, cryotherapy, contrast bath, patient/family education, energy conservation, coping strategies training, Re-evaluation, and Dry needling  RECOMMENDED OTHER SERVICES: none now   CONSULTED AND AGREED WITH PLAN OF CARE: Patient  PLAN FOR NEXT SESSION:  Upgrade HEP as tolerated, consider putty for strengthening, increase resistance as per MD for LUE strengthening, managing hypersensitivity with tolerable progressive desensitization, review scar mobilizations. Consider cupping, IASTM or other means.   Mariam Dollar Beth Dixon, OTR/L  06/15/2023, 12:16 PM

## 2023-06-17 ENCOUNTER — Ambulatory Visit: Payer: BC Managed Care – PPO | Admitting: Occupational Therapy

## 2023-06-17 ENCOUNTER — Encounter: Payer: Self-pay | Admitting: Occupational Therapy

## 2023-06-17 DIAGNOSIS — R202 Paresthesia of skin: Secondary | ICD-10-CM

## 2023-06-17 DIAGNOSIS — M25522 Pain in left elbow: Secondary | ICD-10-CM | POA: Diagnosis not present

## 2023-06-17 DIAGNOSIS — M25612 Stiffness of left shoulder, not elsewhere classified: Secondary | ICD-10-CM | POA: Diagnosis not present

## 2023-06-17 DIAGNOSIS — M6281 Muscle weakness (generalized): Secondary | ICD-10-CM

## 2023-06-17 DIAGNOSIS — R278 Other lack of coordination: Secondary | ICD-10-CM

## 2023-06-17 DIAGNOSIS — M25622 Stiffness of left elbow, not elsewhere classified: Secondary | ICD-10-CM | POA: Diagnosis not present

## 2023-06-17 DIAGNOSIS — R600 Localized edema: Secondary | ICD-10-CM

## 2023-06-17 NOTE — Therapy (Signed)
OUTPATIENT OCCUPATIONAL THERAPY ORTHO TREATMENT  Patient Name: Jessica Mack MRN: [id number] DOB:[birth year], 67 y.o., female Today's Date: 06/17/2023  PCP: Sanda Linger, MD REFERRING PROVIDER: [name], MD   END OF SESSION:  OT End of Session - 06/17/23 0929     Visit Number 4    Number of Visits 12    Date for OT Re-Evaluation 07/09/23    Authorization Type BCBS    OT Start Time [id number]    OT Stop Time 1015    OT Time Calculation (min) 46 min    Equipment Utilized During Treatment putty, massage/cupping    Activity Tolerance Patient tolerated treatment well;No increased pain;Patient limited by pain    Behavior During Therapy [facility] for tasks assessed/performed             Past Medical History:  Diagnosis Date   Avascular necrosis of bone (HCC)    Left talus   Blood transfusion    Breast cancer (HCC) 2005   left   Cancer (HCC)    Dysplastic nevus 1988   right hip   Fatty liver    GERD (gastroesophageal reflux disease)    History of breast cancer 2005   invasive ductal cancer left breast   Hypertension    Hypertriglyceridemia    Irritable bowel syndrome    Metabolic syndrome    resolved with diet and exercise   NASH (nonalcoholic steatohepatitis)    Nocturnal leg cramps 02/14/2015   Osteopenia    Palpitations 09/2012   normal echo   Peripheral neuropathy    Personal history of chemotherapy    Personal history of radiation therapy    Plantar fasciitis 03/2003   PONV (postoperative nausea and vomiting)    Tongue dysplasia    Ulcer    Past Surgical History:  Procedure Laterality Date   ANKLE SURGERY  07/2007   left, revascularization   BREAST LUMPECTOMY  06/2004   left, with port placement   CESAREAN SECTION  1991, 1993   x2   COLONOSCOPY  09/2013   Dr. Loreta Ave   DG GALL BLADDER     IRRIGATION AND DEBRIDEMENT ELBOW Left 05/04/2023   Procedure: LEFT ELBOW IRRIGATION AND DEBRIDEMENT;  Surgeon: [name], MD;  Location: [facility];   Service: Orthopedics;  Laterality: Left;   LAPAROSCOPIC CHOLECYSTECTOMY  2003   MOUTH SURGERY  11/15   implant/crown placed 4/16   SHOULDER ARTHROSCOPY  10/2005   right   SHOULDER SURGERY  12/15   left   TONGUE SURGERY  2012, 2013   WRIST SURGERY  1988   right, DeQuervains   Patient Active Problem List   Diagnosis Date Noted   Triceps tendon rupture, left, initial encounter 05/04/2023   Olecranon bursitis of left elbow 05/04/2023   Elevated ferritin level 04/05/2023   Mild persistent asthma without complication 09/28/2022   NASH (nonalcoholic steatohepatitis) 06/04/2020   Hypertriglyceridemia    Routine general medical examination at a health care facility 02/20/2020   Essential hypertension 02/20/2020   Osteopenia 08/05/2014   Breast cancer of upper-outer quadrant of left female breast (HCC) 07/20/2013   Leukoplakia of oral mucosa, including tongue 07/26/2012   Essential and other specified forms of tremor 07/26/2012   Drug-induced polyneuropathy (HCC) 07/26/2012    ONSET DATE: 05/04/23 DOS elbow bursectomy and triceps repair   REFERRING DIAG: M70.22 (ICD-10-CM) - Olecranon bursitis of left elbow   THERAPY DIAG:  Pain in left elbow  Stiffness of left elbow, not elsewhere classified  Stiffness  of left shoulder, not elsewhere classified  Muscle weakness (generalized)  Paresthesia of skin  Other lack of coordination  Localized edema  Rationale for Evaluation and Treatment: Rehabilitation  SUBJECTIVE:   SUBJECTIVE STATEMENT: 06/17/23: Pt reports paresthesias/burning along medial Lt elbow today after doing various materials for desensitization last night "Maybe I did too much". Pt reports "burning" when asked about pain today, she rates it as 2-3/10. She also reports that she is closing her car door using her L UE.  06/15/23 Pt is now ~6 weeks post-op bursectomy surgery. She saw [name], MD on 06/11/23 whom stated that she "will continue to progress weightbearing  and I have advised she can lift up to 10 pounds." Pt reports that she had a family reunion last weekend and she didn't do "my exercises like I should", however, pt was quite busy and using her L UE functionally. Pt reports that she hit her elbow on a chair yesterday and her scar has been quite sensitive since.   PERTINENT HISTORY: Per MD note 06/11/23: Assessment "She will continue to progress weightbearing and I have advised she can lift up to 10 pounds"  Per referral: "ROM of L elbow and desensitivity therapy "  Per MD orders, she needs "aggressive" scar mobilizations.  She has a history of an old wrist surgery, breast cancer treatments (radiation and chemo) that have left her with some peripheral neuropathy in feet and fingers- treatments were about 13 years ago.  She states retiring due to this paresthesia and pain in her hands and decreased fine motor skills  PRECAUTIONS: None  RED FLAGS: None   WEIGHT BEARING RESTRICTIONS: Yes ROM as tolerated with 10# restriction per MD notes   PAIN:  Are you having pain? Yes, 2-3/10 "burning" along Lt medial elbow NPRS scale Pain location: Left olecranon/surgical area Pain description: Burning, stabbing, tight Aggravating factors: Pushing more aggressively moving quickly Relieving factors: Rest  FALLS: Has patient fallen in last 6 months? Yes. Number of falls 1 -this accident.  Not considered a fall risk.  LIVING ENVIRONMENT: Lives with: lives with their family Has following equipment at home: None  PLOF: Independent  PATIENT GOALS: To decrease pain and increase ability in motion and strength in the left elbow for daily activities  NEXT MD VISIT: 06/11/23   OBJECTIVE: (All objective assessments below are from initial evaluation on: 05/27/23 unless otherwise specified.)   HAND DOMINANCE: Right   ADLs: Overall ADLs: States decreased ability to grab, hold household objects, pain and inability to open containers, use knife to cut, mild to  moderate bathing problems as well.    FUNCTIONAL OUTCOME MEASURES: Eval: Quck DASH 47% impairment today  (Higher % Score  =  More Impairment)     UPPER EXTREMITY ROM     Shoulder to Wrist AROM Left eval  Shoulder flexion 140 (160 Rt)   Shoulder abduction   Shoulder extension   Shoulder internal rotation   Shoulder external rotation   Elbow flexion 117  Elbow extension (-7)  Forearm supination 79  Forearm pronation  80  Wrist flexion WNL  Wrist extension WNL  (Blank rows = not tested)   Hand AROM Left eval  Full Fist Ability (or Gap to Distal Palmar Crease) full  Thumb Opposition  (Kapandji Scale)  WNL  (Blank rows = not tested)   UPPER EXTREMITY MMT:    Eval: Based on observations and limited range of motion, her strength would be rated 3-/5 MMT for elbow flexion and extension today,  no resistance was applied due to 5 pound weight restriction.  Will be tested in detail once she is cleared by the doctor in about 2 weeks.  MMT Left TBD  Shoulder flexion   Shoulder abduction   Shoulder adduction   Shoulder extension   Shoulder internal rotation   Shoulder external rotation   Middle trapezius   Lower trapezius   Elbow flexion   Elbow extension   Forearm supination   Forearm pronation   Wrist flexion   Wrist extension   Wrist ulnar deviation   Wrist radial deviation   (Blank rows = not tested)  HAND FUNCTION: Eval: Observed weakness in affected hand.  Grip strength Right: 66 lbs, Left: 37 lbs   COORDINATION: Eval: Observed coordination impairments with affected Lt arm, seen by inability to fully move the elbow and shoulder.  Details will be tested TBD. Box and Blocks Test: TBD Blocks today   SENSATION: Eval: Around the left elbow surgical area, she has hypersensitivity of her scar/nerves, does not tolerate light touch well, signs of allodynia.   She has a history of paresthesia in the hands and the feet that she states can feel very painful to her (chronic  from 13 years ago breast cancer treatments).  EDEMA:   Eval:  Mildly swollen in Lt elbow today  COGNITION: Eval: Overall cognitive status: WFL for evaluation today   OBSERVATIONS:   Eval: Her surgical area is pink and closed, looks mildly irritated and has hypersensitivity to touch but no other signs of infection.  Diffuse swelling but no significant focal swelling.  She has limited strength and range of motion that is largely due to inhibiting pains and likely some mild scar adhesions.  Overall she presents as allodynia and hypersensitivity to her formerly infected left elbow and surgical area, mild to moderate scar adhesions around the elbow, stiffness pain and weakness to the left elbow that does travel up to the triceps and she does have some stiffness through the left shoulder as well.   TODAY'S TREATMENT:  06/17/23: Began treatment session on UBE x36min, level 1, rotating forward and back with bilateral UE's for reciprocal A/ROM/stretch throughout her Lt UE. Upgraded HEP to include putty exercises with yellow (red putty issued and pt educated to upgrade to this when able) for gentle grip and pinches (lateral and 3-point) to encourage increased strength and functional use of LU overall. Pt was cautioned to not "over do" putty, she performed 10 reps of grip and 5 reps of each pinch in the clinic today. She was educated to balance strengthening with functional activity and decrease reps as necessary, she verbalized understanding of this in the clinic today. Performed scar mobilization techniques, cupping especially along proximal elbow scar for 10 min. Reviewed HEP.   06/15/23: UBE x8 min rotating forward and back with bilateral UE's for reciprocal A/ROM and stretch throughout her Lt UE, pt was noted to maintain rpm of 68-76 throughout this time. Scar management/mobilization and desensitization techniques x10 min with vibrator and without via therapist. Pt is TTP at distal end of scar noted and  stated that this may be where she "banged it against a chair" yesterday. We discussed trying cryotherapy PRN in this area over the next few days. Upgraded HEP to add radial nerve gliding exercises (reviewed and performed in clinic, issued handout from Rehabilitation of the Hand and Upper Extremity 5th edition) to assist with improving the ability of the nerve to move freely among left elbow/UE, as well as to  reduce pain and paresthesias. It was noted that pt reports that she did not feel much stretch if any when performing radial nerve glides. Review and performance of HEP with focus on bolded exercises as below (see Med[employer] HEP), increased resistance to 3#. Pt tolerated treatment well this date.  06/10/23: Pt reports slight decreased sensitivity and has been using vibrator on scar 2-3 time/day at home. She continues to have sensitivity with certain materials, most notably a silk shirt that she was unable to tolerate wearing last Sunday. Desensitization techniques via gentle cross friction massage and use of vibrator was performed x10 min by therapist. She was issued various materials (soft velcro, mole padding/strapping etc) to add to her home desensitization program as well as to use items she has already (examples were discussed and reviewed verbally in the clinic). She reports that she has been using 1# and 2# weight for some pilates via her instructor, but she is not using her full body weight due to current weight restrictions. Her HEP was reviewed and performed in the clinic today in it's entirety w/o resistance, she then completed "Raise the Roof" 10 reps, Bend and Straighten Elbow 10 reps, Palm Up / Palm Down using a 2# weight x10 reps each w/o c/o pain or weakness but a stretch. Her A/ROM for elbow flexion and extension has improved overall since her last visit as she is now able to touch/wash her face, reach to her shoulder and behind her head with her left hand. Verbally reviewed to not lift more  than 5# due to precautions and weight restrictions, she will f/u with her MD tomorrow to see when this can be progressed. A new piece of slightly compressive Tubigrip sleeve was given which she states feels better, she will use PRN during the day and noc.   05/27/23: OT educates on the importance of touching around her sensitive area for desensitization that should be done progressively and systematically.  She is given handouts about this and it is explained in detail-including the pain gate theory, the use of vibration, the use of light touch that is progressive in nature, to be done 4-6 times a day as tolerated for 2 minutes at a time.  She was given a slightly compressive Tubigrip sleeve which she states feels better, and also will help her sleep at night.  It is not as compressive or aggressive as a neoprene sleeve.  Next she was also educated on the importance of scar mobilization to be done to shift the top layers of her skin to allow freedom of motion.  She is given nonslip blue Dycem to help grab her skin to push and pull in 3 different positions-horizontal, diagonal, circular.  She should do these things at least 4 times a day for 2 minutes at a time.  Neck she was also given active range of motion with slight overpressure at the shoulder, elbow and forearm.  She states feeling relatively nonpainful pressures while performing this today for review of understanding.  She was asked to do these things frequently but not very painfully at this time.  She states understanding and feels better by the end of the session.  ExercisesAccess Code: [email or web address] URL: [email or web address] Date: 06/10/2023 Prepared by: Mariam Dollar  Patient Education Access Code: [email or web address] URL: [email or web address] Date: 06/17/2023 Prepared by: Mariam Dollar  Exercises - "Raise the Roof"   - 4-6 x daily - 1-2 sets - 5-10 reps - Bend and Straighten Elbow  - 3-4 x daily - 1-2 sets -  10-15  reps - Palm Up / Palm Down  - 3-4 x daily - 1-2 sets - 10-15 reps - Bend and Pull Back Wrist SLOWLY  - 3-4 x daily - 1-2 sets - 10 reps - Putty Squeezes  - 1 x daily - 7 x weekly - 1 sets - 5-10 reps - Key Pinch with Putty  - 1 x daily - 7 x weekly - 1 sets - 5 reps - 3-Point Pinch with Putty  - 1 x daily - 7 x weekly - 1 sets - 5 reps - Scar Massage/mobilizations & systematic desensitization for neuromuscular rehabilitation.   PATIENT EDUCATION: Education details: See tx section above for details  Person educated: Patient Education method: Verbal Instruction, Teach back, Handouts  Education comprehension: States and demonstrates understanding, Additional Education required    HOME EXERCISE PROGRAM: Access Code: [email or web address] URL: [email or web address] Date: 06/17/2023 Prepared by: Mariam Dollar   GOALS: Goals reviewed with patient? Yes   SHORT TERM GOALS: (STG required if POC>30 days) Target Date: 06/11/23  Pt will obtain protective, custom orthotic. Goal status: N/A/Met  2.  Pt will demo/state understanding of initial HEP to improve pain levels and prerequisite motion. Goal status: Met 06/10/23   LONG TERM GOALS: Target Date: 07/09/23  Pt will improve functional ability by decreased impairment per Quick DASH assessment from 47% to 15% or better, for better quality of life. Goal status: INITIAL  2.  Pt will improve grip strength in Lt hand from 37lbs to at least 55lbs for functional use at home and in IADLs. Goal status: Progressing 06/15/23: 53.6# Lt, 72.5# Rt  3.  Pt will improve A/ROM in Lt elbow flex/ext from 117 / (-7)  to at least 140 / (-5), to have functional motion for tasks like reach and grasp.  Goal status: Progressing  4.  Pt will improve strength in Lt elbow flex and ext from painful 3-/5 MMT to at least 4+/5 MMT to have increased functional ability to carry out selfcare and higher-level homecare tasks with no difficulty. Goal status:  Progressing  5.  Pt will improve coordination skills in Lt arm, as seen by [facility] score on Box and Blocks testing to have increased functional ability to carry out fine motor tasks (fasteners, etc.) and more complex, coordinated IADLs (meal prep, sports, etc.).  Goal status: INITIAL  6.  Pt will decrease pain at worst from 8/10 to 3/10 or better to have better sleep and occupational participation in daily roles. Goal status: Progressing  ASSESSMENT:  CLINICAL IMPRESSION: 06/17/23: Pt is able to use her LUE for increased functional use especially during IADL's. She has c/o "burning" along medial elbow today after performing desensitization techniques at home last evening using different materials. He scar was with noted increased flexibility after treatment session today that included cupping and scar mobilization (with attention & focus to her proximal scar). She was educated in use of putty for gentle grip and pinch with overall goal to increase strength and functional use. Will focus on LTG's next session.   06/15/23 Pt has made nice improvements in A/ROM of L UE/elbow. She is hypersensitive and TTP along her distal scar after "banging it on a chair yesterday". She may benefit from cryotherapy PRN over next several days. She will continue desensitization techniques along her scar. She may benefit from assessment and upgrade of her HEP/resistance as tolerated and putty next visit.  She should benefit from continued out-pt occupational therapy to assist with increased A/ROM, scar mobilization, desensitization and  strengthening as per MD orders. She is currently at a 10# weight restriction following an MD visit on 06/11/23.  PERFORMANCE DEFICITS: in functional skills including ADLs, IADLs, coordination, proprioception, sensation, edema, ROM, strength, pain, fascial restrictions, muscle spasms, flexibility, Gross motor control, body mechanics, endurance, decreased knowledge of precautions, skin integrity,  and UE functional use, cognitive skills including  none , and psychosocial skills including coping strategies and habits.   IMPAIRMENTS: are limiting patient from ADLs, IADLs, rest and sleep, leisure, and social participation.   COMORBIDITIES: may have co-morbidities  that affects occupational performance. Patient will benefit from skilled OT to address above impairments and improve overall function.  MODIFICATION OR ASSISTANCE TO COMPLETE EVALUATION: No modification of tasks or assist necessary to complete an evaluation.  OT OCCUPATIONAL PROFILE AND HISTORY: Problem focused assessment: Including review of records relating to presenting problem.  CLINICAL DECISION MAKING: Moderate - several treatment options, min-mod task modification necessary  REHAB POTENTIAL: Excellent  EVALUATION COMPLEXITY: Low      PLAN:  OT FREQUENCY: 2x/week as able   OT DURATION: 6 weeks (through 07/09/23 as needed)   PLANNED INTERVENTIONS: self care/ADL training, therapeutic exercise, therapeutic activity, neuromuscular re-education, manual therapy, scar mobilization, passive range of motion, splinting, compression bandaging, moist heat, cryotherapy, contrast bath, patient/family education, energy conservation, coping strategies training, Re-evaluation, and Dry needling  RECOMMENDED OTHER SERVICES: none now   CONSULTED AND AGREED WITH PLAN OF CARE: Patient  PLAN FOR NEXT SESSION:  Review putty for grip/pinch strengthening, increase resistance as per MD orders for LUE strengthening, focus on progressive desensitization, scar mobilization/cupping. Begin assessing LTG's.  Mariam Dollar Beth Dixon, OTR/L  06/17/2023, 10:44 AM

## 2023-06-22 ENCOUNTER — Ambulatory Visit: Payer: BC Managed Care – PPO | Admitting: Occupational Therapy

## 2023-06-22 ENCOUNTER — Encounter: Payer: Self-pay | Admitting: Occupational Therapy

## 2023-06-22 DIAGNOSIS — M6281 Muscle weakness (generalized): Secondary | ICD-10-CM

## 2023-06-22 DIAGNOSIS — M25622 Stiffness of left elbow, not elsewhere classified: Secondary | ICD-10-CM | POA: Diagnosis not present

## 2023-06-22 DIAGNOSIS — R278 Other lack of coordination: Secondary | ICD-10-CM

## 2023-06-22 DIAGNOSIS — M25522 Pain in left elbow: Secondary | ICD-10-CM | POA: Diagnosis not present

## 2023-06-22 DIAGNOSIS — R600 Localized edema: Secondary | ICD-10-CM

## 2023-06-22 DIAGNOSIS — M25612 Stiffness of left shoulder, not elsewhere classified: Secondary | ICD-10-CM

## 2023-06-22 DIAGNOSIS — R202 Paresthesia of skin: Secondary | ICD-10-CM

## 2023-06-22 NOTE — Therapy (Signed)
OUTPATIENT OCCUPATIONAL THERAPY ORTHO TREATMENT  Patient Name: Jessica Mack MRN: [id number] DOB:1956-04-03, 67 y.o., female Today's Date: 06/22/2023  PCP: Sanda Linger, MD REFERRING PROVIDER: [name], MD   END OF SESSION:  OT End of Session - 06/22/23 1025     Visit Number 5    Number of Visits 12    Date for OT Re-Evaluation 07/09/23    Authorization Type BC[employer]    OT Start Time 1023    OT Stop Time 1101    OT Time Calculation (min) 38 min    Equipment Utilized During Treatment massage/cupping    Activity Tolerance Patient tolerated treatment well;No increased pain;Patient limited by pain    Behavior During Therapy [facility] for tasks assessed/performed              Past Medical History:  Diagnosis Date   Avascular necrosis of bone (HCC)    Left talus   Blood transfusion    Breast cancer (HCC) 2005   left   Cancer (HCC)    Dysplastic nevus 1988   right hip   Fatty liver    GERD (gastroesophageal reflux disease)    History of breast cancer 2005   invasive ductal cancer left breast   Hypertension    Hypertriglyceridemia    Irritable bowel syndrome    Metabolic syndrome    resolved with diet and exercise   NASH (nonalcoholic steatohepatitis)    Nocturnal leg cramps 02/14/2015   Osteopenia    Palpitations 09/2012   normal echo   Peripheral neuropathy    Personal history of chemotherapy    Personal history of radiation therapy    Plantar fasciitis 03/2003   PONV (postoperative nausea and vomiting)    Tongue dysplasia    Ulcer    Past Surgical History:  Procedure Laterality Date   ANKLE SURGERY  07/2007   left, revascularization   BREAST LUMPECTOMY  06/2004   left, with port placement   CESAREAN SECTION  1991, 1993   x2   COLONOSCOPY  09/2013   Dr. Loreta Ave   DG GALL BLADDER     IRRIGATION AND DEBRIDEMENT ELBOW Left 05/04/2023   Procedure: LEFT ELBOW IRRIGATION AND DEBRIDEMENT;  Surgeon: [name], MD;  Location: [facility];   Service: Orthopedics;  Laterality: Left;   LAPAROSCOPIC CHOLECYSTECTOMY  2003   MOUTH SURGERY  11/15   implant/crown placed 4/16   SHOULDER ARTHROSCOPY  10/2005   right   SHOULDER SURGERY  12/15   left   TONGUE SURGERY  2012, 2013   WRIST SURGERY  1988   right, DeQuervains   Patient Active Problem List   Diagnosis Date Noted   Triceps tendon rupture, left, initial encounter 05/04/2023   Olecranon bursitis of left elbow 05/04/2023   Elevated ferritin level 04/05/2023   Mild persistent asthma without complication 09/28/2022   NASH (nonalcoholic steatohepatitis) 06/04/2020   Hypertriglyceridemia    Routine general medical examination at a health care facility 02/20/2020   Essential hypertension 02/20/2020   Osteopenia 08/05/2014   Breast cancer of upper-outer quadrant of left female breast (HCC) 07/20/2013   Leukoplakia of oral mucosa, including tongue 07/26/2012   Essential and other specified forms of tremor 07/26/2012   Drug-induced polyneuropathy (HCC) 07/26/2012    ONSET DATE: 05/04/23 DOS elbow bursectomy and triceps repair   REFERRING DIAG: M70.22 (ICD-10-CM) - Olecranon bursitis of left elbow   THERAPY DIAG:  Pain in left elbow  Stiffness of left elbow, not elsewhere classified  Stiffness  of left shoulder, not elsewhere classified  Muscle weakness (generalized)  Paresthesia of skin  Other lack of coordination  Localized edema  Rationale for Evaluation and Treatment: Rehabilitation  SUBJECTIVE:   SUBJECTIVE STATEMENT: 06/22/23: Pt reports that she is doing 3# weights at home without difficulty. She reports Mod I HEP and putty.   06/17/23: Pt reports paresthesias/burning along medial Lt elbow today after doing various materials for desensitization last night "Maybe I did too much". Pt reports "burning" when asked about pain today, she rates it as 2-3/10. She also reports that she is closing her car door using her L UE.  PERTINENT HISTORY: Per MD note 06/11/23:  Assessment "She will continue to progress weightbearing and I have advised she can lift up to 10 pounds"  Per referral: "ROM of L elbow and desensitivity therapy "  Per MD orders, she needs "aggressive" scar mobilizations.  She has a history of an old wrist surgery, breast cancer treatments (radiation and chemo) that have left her with some peripheral neuropathy in feet and fingers- treatments were about 13 years ago.  She states retiring due to this paresthesia and pain in her hands and decreased fine motor skills  PRECAUTIONS: None  RED FLAGS: None   WEIGHT BEARING RESTRICTIONS: Yes ROM as tolerated with 10# restriction per MD notes   PAIN:  Are you having pain? No, 0/10 Lt medial elbow NPRS scale Pain location: Left olecranon/surgical area Pain description: Burning, stabbing, tight Aggravating factors: Pushing more aggressively moving quickly Relieving factors: Rest  FALLS: Has patient fallen in last 6 months? Yes. Number of falls 1 -this accident.  Not considered a fall risk.  LIVING ENVIRONMENT: Lives with: lives with their family Has following equipment at home: None  PLOF: Independent  PATIENT GOALS: To decrease pain and increase ability in motion and strength in the left elbow for daily activities  NEXT MD VISIT: 06/11/23   OBJECTIVE: (All objective assessments below are from initial evaluation on: 05/27/23 unless otherwise specified.)   HAND DOMINANCE: Right   ADLs: Overall ADLs: States decreased ability to grab, hold household objects, pain and inability to open containers, use knife to cut, mild to moderate bathing problems as well.    FUNCTIONAL OUTCOME MEASURES: Eval: Quck DASH 47% impairment today  (Higher % Score  =  More Impairment)     UPPER EXTREMITY ROM     Shoulder to Wrist AROM Left eval  Shoulder flexion 140 (160 Rt)   Shoulder abduction   Shoulder extension   Shoulder internal rotation   Shoulder external rotation   Elbow flexion 117  Elbow  extension (-7)  Forearm supination 79  Forearm pronation  80  Wrist flexion WNL  Wrist extension WNL  (Blank rows = not tested)   Hand AROM Left eval  Full Fist Ability (or Gap to Distal Palmar Crease) full  Thumb Opposition  (Kapandji Scale)  WNL  (Blank rows = not tested)   UPPER EXTREMITY MMT:    Eval: Based on observations and limited range of motion, her strength would be rated 3-/5 MMT for elbow flexion and extension today, no resistance was applied due to 5 pound weight restriction.  Will be tested in detail once she is cleared by the doctor in about 2 weeks.  MMT Left TBD  Shoulder flexion   Shoulder abduction   Shoulder adduction   Shoulder extension   Shoulder internal rotation   Shoulder external rotation   Middle trapezius   Lower trapezius   Elbow  flexion   Elbow extension   Forearm supination   Forearm pronation   Wrist flexion   Wrist extension   Wrist ulnar deviation   Wrist radial deviation   (Blank rows = not tested)  HAND FUNCTION: Eval: Observed weakness in affected hand.  Grip strength Right: 66 lbs, Left: 37 lbs   COORDINATION: Eval: Observed coordination impairments with affected Lt arm, seen by inability to fully move the elbow and shoulder.  Details will be tested TBD. Box and Blocks Test: TBD Blocks today   SENSATION: Eval: Around the left elbow surgical area, she has hypersensitivity of her scar/nerves, does not tolerate light touch well, signs of allodynia.   She has a history of paresthesia in the hands and the feet that she states can feel very painful to her (chronic from 13 years ago breast cancer treatments).  EDEMA:   Eval:  Mildly swollen in Lt elbow today  COGNITION: Eval: Overall cognitive status: WFL for evaluation today   OBSERVATIONS:   Eval: Her surgical area is pink and closed, looks mildly irritated and has hypersensitivity to touch but no other signs of infection.  Diffuse swelling but no significant focal swelling.   She has limited strength and range of motion that is largely due to inhibiting pains and likely some mild scar adhesions.  Overall she presents as allodynia and hypersensitivity to her formerly infected left elbow and surgical area, mild to moderate scar adhesions around the elbow, stiffness pain and weakness to the left elbow that does travel up to the triceps and she does have some stiffness through the left shoulder as well.   TODAY'S TREATMENT:  06/22/23: Re-assessment: Quick DASH score = 10%, much improved from initial visit/assessment; Assessed LTG's (see goals below for details). A/ROM in Lt elbow flex/ext is improved today 146/ (-4) noted. UBE deferred as pt arrived a little late since she was playing pickle-ball. Reviewed HEP for strength and stretching. Pt notes tenderness with resisted elbow flexion/extension as compared to her initial assessment, overall MMT 3+/5 from 3-/5 with LTG of 4+/5 or better. Scar desensitization/mobilization & cupping especially along proximal elbow scar for 10 min. She continues to have some hypersensitivity along her proximal scar noted, but this is slowly improving from her initial visit. Pt appears independent with HEP and strengthening with putty, after verbal discussion in clinic today, she is going to progress to non-weighted pilates exercises at her next pilates session. We discussed only to do this if she is pain free as she continues to have 10# restriction for her elbow per MD orders.   06/17/23: Began treatment session on UBE x19min, level 1, rotating forward and back with bilateral UE's for reciprocal A/ROM/stretch throughout her Lt UE. Upgraded HEP to include putty exercises with yellow (red putty issued and pt educated to upgrade to this when able) for gentle grip and pinches (lateral and 3-point) to encourage increased strength and functional use of LU overall. Pt was cautioned to not "over do" putty, she performed 10 reps of grip and 5 reps of each pinch  in the clinic today. She was educated to balance strengthening with functional activity and decrease reps as necessary, she verbalized understanding of this in the clinic today. Performed scar mobilization techniques, cupping especially along proximal elbow scar for 10 min. Reviewed HEP.   06/15/23: UBE x8 min rotating forward and back with bilateral UE's for reciprocal A/ROM and stretch throughout her Lt UE, pt was noted to maintain rpm of 68-76 throughout this time.  Scar management/mobilization and desensitization techniques x10 min with vibrator and without via therapist. Pt is TTP at distal end of scar noted and stated that this may be where she "banged it against a chair" yesterday. We discussed trying cryotherapy PRN in this area over the next few days. Upgraded HEP to add radial nerve gliding exercises (reviewed and performed in clinic, issued handout from Rehabilitation of the Hand and Upper Extremity 5th edition) to assist with improving the ability of the nerve to move freely among left elbow/UE, as well as to reduce pain and paresthesias. It was noted that pt reports that she did not feel much stretch if any when performing radial nerve glides. Review and performance of HEP with focus on bolded exercises as below (see [employer] HEP), increased resistance to 3#. Pt tolerated treatment well this date.  Patient Education Access Code: [email or web address] URL: [email or web address] Date: 06/17/2023 Prepared by: Mariam Dollar  Exercises - "Raise the Roof"   - 4-6 x daily - 1-2 sets - 5-10 reps - Bend and Straighten Elbow  - 3-4 x daily - 1-2 sets - 10-15 reps - Palm Up / Palm Down  - 3-4 x daily - 1-2 sets - 10-15 reps - Bend and Pull Back Wrist SLOWLY  - 3-4 x daily - 1-2 sets - 10 reps - Putty Squeezes  - 1 x daily - 7 x weekly - 1 sets - 5-10 reps - Key Pinch with Putty  - 1 x daily - 7 x weekly - 1 sets - 5 reps - 3-Point Pinch with Putty  - 1 x daily - 7 x weekly - 1 sets - 5 reps - Scar  Massage/mobilizations & systematic desensitization for neuromuscular rehabilitation.   PATIENT EDUCATION: Education details: See tx section above for details  Person educated: Patient Education method: Verbal Instruction, Teach back, Handouts  Education comprehension: States and demonstrates understanding, Additional Education required    HOME EXERCISE PROGRAM: Access Code: [email or web address] URL: [email or web address] Date: 06/17/2023 Prepared by: Mariam Dollar   GOALS: Goals reviewed with patient? Yes   SHORT TERM GOALS: (STG required if POC>30 days) Target Date: 06/11/23  Pt will obtain protective, custom orthotic. Goal status: N/A/Met  2.  Pt will demo/state understanding of initial HEP to improve pain levels and prerequisite motion. Goal status: Met 06/10/23   LONG TERM GOALS: Target Date: 07/09/23  Pt will improve functional ability by decreased impairment per Quick DASH assessment from 47% to 15% or better, for better quality of life. Goal status: 06/22/23: 10% Met  2.  Pt will improve grip strength in Lt hand from 37lbs to at least 55lbs for functional use at home and in IADLs. Goal status: Progressing  06/15/23: 53.6# Lt, 72.5# Rt  3.  Pt will improve A/ROM in Lt elbow flex/ext from 117 / (-7)  to at least 140 / (-5), to have functional motion for tasks like reach and grasp.  Goal status: 06/22/23: 146/ (-4), Met  4.  Pt will improve strength in Lt elbow flex and ext from painful 3-/5 MMT to at least 4+/5 MMT to have increased functional ability to carry out selfcare and higher-level homecare tasks with no difficulty. Goal status: 06/22/23: +3/5 MMT Progressing  5.  Pt will improve coordination skills in Lt arm, as seen by [facility] score on Box and Blocks testing to have increased functional ability to carry out fine motor tasks (fasteners, etc.) and more complex, coordinated IADLs (meal prep, sports, etc.).  Goal status: 06/22/23: 62, WFL's/Progressing ?assess 9HPT  6.   Pt will decrease pain at worst from 8/10 to 3/10 or better to have better sleep and occupational participation in daily roles. Goal status: 06/22/23: Pt report 0/10 with functional activity, but occasionally 3/10 when using vibrator. Met  ASSESSMENT:  CLINICAL IMPRESSION: 06/22/23: Pt is making nice progress towards her LTG's at this time. She continues to have some scar hypersensitivity especially along her proximal elbow scar, but has been diligent with her HEP scar management. She appears independent with her HEP and her A/ROM of her left elbow is improved overall, however, she continues to have some pain/tenderness with resisted elbow exten > flexion. This should continue to improve over time with continued strengthening. She is using her LUE during all functional activity and is playing pickle-ball. She remains at a 10# weight restriction following an MD visit on 06/11/23.  06/17/23: Pt is able to use her LUE for increased functional use especially during IADL's. She has c/o "burning" along medial elbow today after performing desensitization techniques at home last evening using different materials. He scar was with noted increased flexibility after treatment session today that included cupping and scar mobilization (with attention & focus to her proximal scar). She was educated in use of putty for gentle grip and pinch with overall goal to increase strength and functional use. Will focus on LTG's next session.  PERFORMANCE DEFICITS: in functional skills including ADLs, IADLs, coordination, proprioception, sensation, edema, ROM, strength, pain, fascial restrictions, muscle spasms, flexibility, Gross motor control, body mechanics, endurance, decreased knowledge of precautions, skin integrity, and UE functional use, cognitive skills including  none , and psychosocial skills including coping strategies and habits.   IMPAIRMENTS: are limiting patient from ADLs, IADLs, rest and sleep, leisure, and social  participation.   COMORBIDITIES: may have co-morbidities  that affects occupational performance. Patient will benefit from skilled OT to address above impairments and improve overall function.  MODIFICATION OR ASSISTANCE TO COMPLETE EVALUATION: No modification of tasks or assist necessary to complete an evaluation.  OT OCCUPATIONAL PROFILE AND HISTORY: Problem focused assessment: Including review of records relating to presenting problem.  CLINICAL DECISION MAKING: Moderate - several treatment options, min-mod task modification necessary  REHAB POTENTIAL: Excellent  EVALUATION COMPLEXITY: Low      PLAN:  OT FREQUENCY: 2x/week as able   OT DURATION: 6 weeks (through 07/09/23 as needed)   PLANNED INTERVENTIONS: self care/ADL training, therapeutic exercise, therapeutic activity, neuromuscular re-education, manual therapy, scar mobilization, passive range of motion, splinting, compression bandaging, moist heat, cryotherapy, contrast bath, patient/family education, energy conservation, coping strategies training, Re-evaluation, and Dry needling  RECOMMENDED OTHER SERVICES: none now   CONSULTED AND AGREED WITH PLAN OF CARE: Patient  PLAN FOR NEXT SESSION:  06/22/23: Focus on LTG's and functional reach, grip, strengthening. Review putty for grip/pinch strengthening, increase resistance as per MD orders, focus on progressive desensitization, scar mobilization/cupping.   Mariam Dollar Beth Dixon, OTR/L  06/22/2023, 12:48 PM

## 2023-06-24 ENCOUNTER — Ambulatory Visit: Payer: BC Managed Care – PPO | Admitting: Occupational Therapy

## 2023-06-24 ENCOUNTER — Encounter: Payer: Self-pay | Admitting: Occupational Therapy

## 2023-06-24 DIAGNOSIS — M25522 Pain in left elbow: Secondary | ICD-10-CM | POA: Diagnosis not present

## 2023-06-24 DIAGNOSIS — M25622 Stiffness of left elbow, not elsewhere classified: Secondary | ICD-10-CM

## 2023-06-24 DIAGNOSIS — R600 Localized edema: Secondary | ICD-10-CM

## 2023-06-24 DIAGNOSIS — M25612 Stiffness of left shoulder, not elsewhere classified: Secondary | ICD-10-CM | POA: Diagnosis not present

## 2023-06-24 DIAGNOSIS — M6281 Muscle weakness (generalized): Secondary | ICD-10-CM | POA: Diagnosis not present

## 2023-06-24 DIAGNOSIS — R278 Other lack of coordination: Secondary | ICD-10-CM

## 2023-06-24 DIAGNOSIS — R202 Paresthesia of skin: Secondary | ICD-10-CM

## 2023-06-24 NOTE — Therapy (Signed)
OUTPATIENT OCCUPATIONAL THERAPY ORTHO TREATMENT  Patient Name: Jessica Mack MRN: [id number] DOB:01-02-1956, 67 y.o., female Today's Date: 06/24/2023  PCP: Sanda Linger, MD REFERRING PROVIDER: [name], MD   END OF SESSION:  OT End of Session - 06/24/23 0932     Visit Number 6    Number of Visits 12    Date for OT Re-Evaluation 07/09/23    Authorization Type BCBS    OT Start Time 0931    OT Stop Time 1016    OT Time Calculation (min) 45 min    Equipment Utilized During Treatment massage/cupping, UBE, 3 & 4# weights    Activity Tolerance Patient tolerated treatment well;No increased pain    Behavior During Therapy [facility] for tasks assessed/performed               Past Medical History:  Diagnosis Date   Avascular necrosis of bone (HCC)    Left talus   Blood transfusion    Breast cancer (HCC) 2005   left   Cancer (HCC)    Dysplastic nevus 1988   right hip   Fatty liver    GERD (gastroesophageal reflux disease)    History of breast cancer 2005   invasive ductal cancer left breast   Hypertension    Hypertriglyceridemia    Irritable bowel syndrome    Metabolic syndrome    resolved with diet and exercise   NASH (nonalcoholic steatohepatitis)    Nocturnal leg cramps 02/14/2015   Osteopenia    Palpitations 09/2012   normal echo   Peripheral neuropathy    Personal history of chemotherapy    Personal history of radiation therapy    Plantar fasciitis 03/2003   PONV (postoperative nausea and vomiting)    Tongue dysplasia    Ulcer    Past Surgical History:  Procedure Laterality Date   ANKLE SURGERY  07/2007   left, revascularization   BREAST LUMPECTOMY  06/2004   left, with port placement   CESAREAN SECTION  1991, 1993   x2   COLONOSCOPY  09/2013   Dr. Loreta Ave   DG GALL BLADDER     IRRIGATION AND DEBRIDEMENT ELBOW Left 05/04/2023   Procedure: LEFT ELBOW IRRIGATION AND DEBRIDEMENT;  Surgeon: [name], MD;  Location: [facility];  Service:  Orthopedics;  Laterality: Left;   LAPAROSCOPIC CHOLECYSTECTOMY  2003   MOUTH SURGERY  11/15   implant/crown placed 4/16   SHOULDER ARTHROSCOPY  10/2005   right   SHOULDER SURGERY  12/15   left   TONGUE SURGERY  2012, 2013   WRIST SURGERY  1988   right, DeQuervains   Patient Active Problem List   Diagnosis Date Noted   Triceps tendon rupture, left, initial encounter 05/04/2023   Olecranon bursitis of left elbow 05/04/2023   Elevated ferritin level 04/05/2023   Mild persistent asthma without complication 09/28/2022   NASH (nonalcoholic steatohepatitis) 06/04/2020   Hypertriglyceridemia    Routine general medical examination at a health care facility 02/20/2020   Essential hypertension 02/20/2020   Osteopenia 08/05/2014   Breast cancer of upper-outer quadrant of left female breast (HCC) 07/20/2013   Leukoplakia of oral mucosa, including tongue 07/26/2012   Essential and other specified forms of tremor 07/26/2012   Drug-induced polyneuropathy (HCC) 07/26/2012    ONSET DATE: 05/04/23 DOS elbow bursectomy and triceps repair   REFERRING DIAG: M70.22 (ICD-10-CM) - Olecranon bursitis of left elbow   THERAPY DIAG:  Pain in left elbow  Stiffness of left elbow, not elsewhere  classified  Stiffness of left shoulder, not elsewhere classified  Muscle weakness (generalized)  Paresthesia of skin  Other lack of coordination  Localized edema  Rationale for Evaluation and Treatment: Rehabilitation  SUBJECTIVE:   SUBJECTIVE STATEMENT: 06/24/23: Pt reports that she is doing the reformer during pilates and is making adjustments as needed to avoid overuse/too much stress on L UE. Pt reports opening her pill bottle without difficulty and "I didn't even know I did it, until I had done it."   PERTINENT HISTORY: Per MD note 06/11/23: Assessment "She will continue to progress weightbearing and I have advised she can lift up to 10 pounds"  Per referral: "ROM of L elbow and desensitivity therapy  "  Per MD orders, she needs "aggressive" scar mobilizations.  She has a history of an old wrist surgery, breast cancer treatments (radiation and chemo) that have left her with some peripheral neuropathy in feet and fingers- treatments were about 13 years ago.  She states retiring due to this paresthesia and pain in her hands and decreased fine motor skills  PRECAUTIONS: None  RED FLAGS: None   WEIGHT BEARING RESTRICTIONS: Yes ROM as tolerated with 10# restriction per MD notes   PAIN:  Are you having pain? No, 0/10 Lt medial elbow NPRS scale Pain location: Left olecranon/surgical area Pain description: Burning, stabbing, tight Aggravating factors: Pushing more aggressively moving quickly Relieving factors: Rest  FALLS: Has patient fallen in last 6 months? Yes. Number of falls 1 -this accident.  Not considered a fall risk.  LIVING ENVIRONMENT: Lives with: lives with their family Has following equipment at home: None  PLOF: Independent  PATIENT GOALS: To decrease pain and increase ability in motion and strength in the left elbow for daily activities  NEXT MD VISIT: 06/11/23   OBJECTIVE: (All objective assessments below are from initial evaluation on: 05/27/23 unless otherwise specified.)   HAND DOMINANCE: Right   ADLs: Overall ADLs: States decreased ability to grab, hold household objects, pain and inability to open containers, use knife to cut, mild to moderate bathing problems as well.    FUNCTIONAL OUTCOME MEASURES: Eval: Quck DASH 47% impairment today  (Higher % Score  =  More Impairment)     UPPER EXTREMITY ROM     Shoulder to Wrist AROM Left eval  Shoulder flexion 140 (160 Rt)   Shoulder abduction   Shoulder extension   Shoulder internal rotation   Shoulder external rotation   Elbow flexion 117  Elbow extension (-7)  Forearm supination 79  Forearm pronation  80  Wrist flexion WNL  Wrist extension WNL  (Blank rows = not tested)   Hand AROM Left eval  Full  Fist Ability (or Gap to Distal Palmar Crease) full  Thumb Opposition  (Kapandji Scale)  WNL  (Blank rows = not tested)   UPPER EXTREMITY MMT:    Eval: Based on observations and limited range of motion, her strength would be rated 3-/5 MMT for elbow flexion and extension today, no resistance was applied due to 5 pound weight restriction.  Will be tested in detail once she is cleared by the doctor in about 2 weeks.  MMT Left TBD  Shoulder flexion   Shoulder abduction   Shoulder adduction   Shoulder extension   Shoulder internal rotation   Shoulder external rotation   Middle trapezius   Lower trapezius   Elbow flexion   Elbow extension   Forearm supination   Forearm pronation   Wrist flexion   Wrist extension  Wrist ulnar deviation   Wrist radial deviation   (Blank rows = not tested)  HAND FUNCTION: Eval: Observed weakness in affected hand.  Grip strength Right: 66 lbs, Left: 37 lbs   COORDINATION: Eval: Observed coordination impairments with affected Lt arm, seen by inability to fully move the elbow and shoulder.  Details will be tested TBD. 06/22/23: Box and Blocks Test: Box and Blocks Lt = 62, WFL's.  SENSATION: Eval: Around the left elbow surgical area, she has hypersensitivity of her scar/nerves, does not tolerate light touch well, signs of allodynia.   She has a history of paresthesia in the hands and the feet that she states can feel very painful to her (chronic from 13 years ago breast cancer treatments).  EDEMA:   Eval:  Mildly swollen in Lt elbow today  COGNITION: Eval: Overall cognitive status: WFL for evaluation today   OBSERVATIONS:   Eval: Her surgical area is pink and closed, looks mildly irritated and has hypersensitivity to touch but no other signs of infection.  Diffuse swelling but no significant focal swelling.  She has limited strength and range of motion that is largely due to inhibiting pains and likely some mild scar adhesions.  Overall she  presents as allodynia and hypersensitivity to her formerly infected left elbow and surgical area, mild to moderate scar adhesions around the elbow, stiffness pain and weakness to the left elbow that does travel up to the triceps and she does have some stiffness through the left shoulder as well.   TODAY'S TREATMENT:  06/24/23 UBE x62min, increased resistance to level 2, rotating forward and back with bilateral UE's for reciprocal A/ROM/stretch throughout her Lt UE. Functional reaching activities into overhead cabinets, grasp and release of 3 & 4# weights x 2 sets of 10 on bottom and middle shelf w/o c/o. Discussed increasing to 5# wts at home after this weekend if able. Verbal review of HEP, increased putty resistence and functional activity at home and during leisure activities. Pt is Mod I and is grading activity as needed. Pt is currently using her Lt UE for all ADL's and activity. Functional tasks appear to be getting easier per her report. Scar desensitization/mobilization, vibration & cupping Lt elbow scar for 15 min. Pt is able to tolerate increased mobilization and desensitization techniques with rougher objects noted today. Added hook/velcro to home desensitization tool kit as pt tolerated light tapping in clinic without adverse effect.   06/22/23: Re-assessment: Quick DASH score = 10%, much improved from initial visit/assessment; 06/22/23: Box and Blocks Lt = 62, WFL's. Assessed LTG's (see goals below for details). A/ROM in Lt elbow flex/ext is improved today 146/ (-4) noted. UBE deferred as pt arrived a little late since she was playing pickle-ball. Reviewed HEP for strength and stretching. Pt notes tenderness with resisted elbow flexion/extension as compared to her initial assessment, overall MMT 3+/5 from 3-/5 with LTG of 4+/5 or better. Scar desensitization/mobilization & cupping especially along proximal elbow scar for 10 min. She continues to have some hypersensitivity along her proximal scar  noted, but this is slowly improving from her initial visit. Pt appears independent with HEP and strengthening with putty, after verbal discussion in clinic today, she is going to progress to non-weighted pilates exercises at her next pilates session. We discussed only to do this if she is pain free as she continues to have 10# restriction for her elbow per MD orders.   06/17/23: Began treatment session on UBE x4min, level 1, rotating forward and back with  bilateral UE's for reciprocal A/ROM/stretch throughout her Lt UE. Upgraded HEP to include putty exercises with yellow (red putty issued and pt educated to upgrade to this when able) for gentle grip and pinches (lateral and 3-point) to encourage increased strength and functional use of LU overall. Pt was cautioned to not "over do" putty, she performed 10 reps of grip and 5 reps of each pinch in the clinic today. She was educated to balance strengthening with functional activity and decrease reps as necessary, she verbalized understanding of this in the clinic today. Performed scar mobilization techniques, cupping especially along proximal elbow scar for 10 min. Reviewed HEP.   Patient Education Access Code: [email or web address] URL: [email or web address] Date: 06/17/2023 Prepared by: Mariam Dollar  Exercises - "Raise the Roof"   - 4-6 x daily - 1-2 sets - 5-10 reps - Bend and Straighten Elbow  - 3-4 x daily - 1-2 sets - 10-15 reps - Palm Up / Palm Down  - 3-4 x daily - 1-2 sets - 10-15 reps - Bend and Pull Back Wrist SLOWLY  - 3-4 x daily - 1-2 sets - 10 reps - Putty Squeezes  - 1 x daily - 7 x weekly - 1 sets - 5-10 reps - Key Pinch with Putty  - 1 x daily - 7 x weekly - 1 sets - 5 reps - 3-Point Pinch with Putty  - 1 x daily - 7 x weekly - 1 sets - 5 reps - Scar Massage/mobilizations & systematic desensitization for neuromuscular rehabilitation.   PATIENT EDUCATION: Education details: See tx section above for details  Person educated:  Patient Education method: Verbal Instruction, Teach back, Handouts  Education comprehension: States and demonstrates understanding, Additional Education required    HOME EXERCISE PROGRAM: Access Code: [email or web address] URL: [email or web address] Date: 06/17/2023 Prepared by: Mariam Dollar   GOALS: Goals reviewed with patient? Yes   SHORT TERM GOALS: (STG required if POC>30 days) Target Date: 06/11/23  Pt will obtain protective, custom orthotic. Goal status: N/A/Met  2.  Pt will demo/state understanding of initial HEP to improve pain levels and prerequisite motion. Goal status: Met 06/10/23   LONG TERM GOALS: Target Date: 07/09/23  Pt will improve functional ability by decreased impairment per Quick DASH assessment from 47% to 15% or better, for better quality of life. Goal status: 06/22/23: 10% Met  2.  Pt will improve grip strength in Lt hand from 37lbs to at least 55lbs for functional use at home and in IADLs. Goal status: Progressing  06/15/23: 53.6# Lt, 72.5# Rt  3.  Pt will improve A/ROM in Lt elbow flex/ext from 117 / (-7)  to at least 140 / (-5), to have functional motion for tasks like reach and grasp.  Goal status: 06/22/23: 146/ (-4), Met  4.  Pt will improve strength in Lt elbow flex and ext from painful 3-/5 MMT to at least 4+/5 MMT to have increased functional ability to carry out selfcare and higher-level homecare tasks with no difficulty. Goal status: 06/22/23: +3/5 MMT Progressing  5.  Pt will improve coordination skills in Lt arm, as seen by [facility] score on Box and Blocks testing to have increased functional ability to carry out fine motor tasks (fasteners, etc.) and more complex, coordinated IADLs (meal prep, sports, etc.).  Goal status: 06/22/23: 62, WFL's/Progressing ?assess 9HPT  6.  Pt will decrease pain at worst from 8/10 to 3/10 or better to have better sleep and occupational participation in daily roles. Goal status: 06/22/23: Pt report 0/10 with  functional activity, but occasionally 3/10 when using vibrator. Met  ASSESSMENT:  CLINICAL IMPRESSION: 06/24/23: Pt continues to make nice gains with focus on LTG's. She is able to tolerate increased mobilization, vibration and desensitization techniques to left elbow scar and hook/velcro was added today for tapping along scar. Resisted functional overhead reach today on shelves in cabinet using 3 & 4# weights, with plan to continue to increase resistance as tolerated. She is using her left UE for all activity and focus is now on continued strengthening throughout. It is noted that she remains at a 10# weight restriction following an MD visit on 06/11/23.  PERFORMANCE DEFICITS: in functional skills including ADLs, IADLs, coordination, proprioception, sensation, edema, ROM, strength, pain, fascial restrictions, muscle spasms, flexibility, Gross motor control, body mechanics, endurance, decreased knowledge of precautions, skin integrity, and UE functional use, cognitive skills including  none , and psychosocial skills including coping strategies and habits.   IMPAIRMENTS: are limiting patient from ADLs, IADLs, rest and sleep, leisure, and social participation.   COMORBIDITIES: may have co-morbidities  that affects occupational performance. Patient will benefit from skilled OT to address above impairments and improve overall function.  MODIFICATION OR ASSISTANCE TO COMPLETE EVALUATION: No modification of tasks or assist necessary to complete an evaluation.  OT OCCUPATIONAL PROFILE AND HISTORY: Problem focused assessment: Including review of records relating to presenting problem.  CLINICAL DECISION MAKING: Moderate - several treatment options, min-mod task modification necessary  REHAB POTENTIAL: Excellent  EVALUATION COMPLEXITY: Low      PLAN:  OT FREQUENCY: 2x/week as able   OT DURATION: 6 weeks (through 07/09/23 as needed)   PLANNED INTERVENTIONS: self care/ADL training, therapeutic  exercise, therapeutic activity, neuromuscular re-education, manual therapy, scar mobilization, passive range of motion, splinting, compression bandaging, moist heat, cryotherapy, contrast bath, patient/family education, energy conservation, coping strategies training, Re-evaluation, and Dry needling  RECOMMENDED OTHER SERVICES: none now   CONSULTED AND AGREED WITH PLAN OF CARE: Patient  PLAN FOR NEXT SESSION:  06/24/23: UBE level 2, seat position 8, functional reach, grip, strengthening as tolerated up to 10# LUE, ? 9 HPT, progressive desensitization, vibration, scar mobilization/cupping. Review and upgrade HEP as tolerated.  Mariam Dollar Beth Dixon, OTR/L  06/24/2023, 12:50 PM

## 2023-06-28 ENCOUNTER — Encounter (HOSPITAL_COMMUNITY): Payer: Self-pay

## 2023-06-28 ENCOUNTER — Emergency Department (HOSPITAL_COMMUNITY)
Admission: EM | Admit: 2023-06-28 | Discharge: 2023-06-29 | Disposition: A | Discharge status: Home / Self Care | Payer: PPO | Attending: Emergency Medicine | Admitting: Emergency Medicine

## 2023-06-28 ENCOUNTER — Other Ambulatory Visit: Payer: Self-pay

## 2023-06-28 DIAGNOSIS — Z853 Personal history of malignant neoplasm of breast: Secondary | ICD-10-CM | POA: Diagnosis not present

## 2023-06-28 DIAGNOSIS — J4530 Mild persistent asthma, uncomplicated: Secondary | ICD-10-CM | POA: Diagnosis not present

## 2023-06-28 DIAGNOSIS — S76811A Strain of other specified muscles, fascia and tendons at thigh level, right thigh, initial encounter: Secondary | ICD-10-CM | POA: Insufficient documentation

## 2023-06-28 DIAGNOSIS — I10 Essential (primary) hypertension: Secondary | ICD-10-CM | POA: Diagnosis not present

## 2023-06-28 DIAGNOSIS — X58XXXA Exposure to other specified factors, initial encounter: Secondary | ICD-10-CM | POA: Insufficient documentation

## 2023-06-28 DIAGNOSIS — S76311A Strain of muscle, fascia and tendon of the posterior muscle group at thigh level, right thigh, initial encounter: Secondary | ICD-10-CM

## 2023-06-28 DIAGNOSIS — S79921A Unspecified injury of right thigh, initial encounter: Secondary | ICD-10-CM | POA: Diagnosis present

## 2023-06-28 NOTE — ED Triage Notes (Signed)
Pt reports with right hip, buttock pain that radiates down her right leg. Pt states that she felt like she pulled something a week ago but today it has gotten more severe, pt states that she cannot put pressure on that right leg. Pt took aleve 5 pm, 915 pm. Pt took 2 gabapentin at 945 pm and 1 oxycodone 5 mg at 1030 pm with no relief.

## 2023-06-29 ENCOUNTER — Encounter (HOSPITAL_BASED_OUTPATIENT_CLINIC_OR_DEPARTMENT_OTHER): Payer: Self-pay | Admitting: Orthopaedic Surgery

## 2023-06-29 ENCOUNTER — Encounter: Payer: Self-pay | Admitting: Internal Medicine

## 2023-06-29 ENCOUNTER — Encounter: Payer: Self-pay | Admitting: Occupational Therapy

## 2023-06-29 ENCOUNTER — Telehealth: Payer: Self-pay

## 2023-06-29 ENCOUNTER — Encounter: Payer: BC Managed Care – PPO | Admitting: Occupational Therapy

## 2023-06-29 LAB — I-STAT CHEM 8, ED
BUN: 26 mg/dL — ABNORMAL HIGH (ref 8–23)
Calcium, Ion: 1.14 mmol/L — ABNORMAL LOW (ref 1.15–1.40)
Chloride: 103 mmol/L (ref 98–111)
Creatinine, Ser: 0.9 mg/dL (ref 0.44–1.00)
Glucose, Bld: 101 mg/dL — ABNORMAL HIGH (ref 70–99)
HCT: 32 % — ABNORMAL LOW (ref 36.0–46.0)
Hemoglobin: 10.9 g/dL — ABNORMAL LOW (ref 12.0–15.0)
Potassium: 3.9 mmol/L (ref 3.5–5.1)
Sodium: 136 mmol/L (ref 135–145)
TCO2: 21 mmol/L — ABNORMAL LOW (ref 22–32)

## 2023-06-29 MED ORDER — CYCLOBENZAPRINE HCL 10 MG PO TABS
10.0000 mg | ORAL_TABLET | Freq: Every day | ORAL | 0 refills | Status: DC
Start: 2023-06-29 — End: 2023-07-01

## 2023-06-29 MED ORDER — CYCLOBENZAPRINE HCL 10 MG PO TABS
5.0000 mg | ORAL_TABLET | Freq: Once | ORAL | Status: AC
  Administered 2023-06-29: 5 mg via ORAL
  Filled 2023-06-29: qty 1

## 2023-06-29 MED ORDER — DIAZEPAM 5 MG/ML IJ SOLN
2.5000 mg | Freq: Once | INTRAMUSCULAR | Status: AC
  Administered 2023-06-29: 2.5 mg via INTRAVENOUS
  Filled 2023-06-29: qty 2

## 2023-06-29 MED ORDER — KETOROLAC TROMETHAMINE 15 MG/ML IJ SOLN
7.5000 mg | Freq: Once | INTRAMUSCULAR | Status: AC
  Administered 2023-06-29: 7.5 mg via INTRAVENOUS
  Filled 2023-06-29: qty 1

## 2023-06-29 MED ORDER — DEXAMETHASONE SODIUM PHOSPHATE 10 MG/ML IJ SOLN
6.0000 mg | Freq: Once | INTRAMUSCULAR | Status: AC
  Administered 2023-06-29: 6 mg via INTRAVENOUS
  Filled 2023-06-29: qty 1

## 2023-06-29 NOTE — ED Provider Notes (Signed)
[place] EMERGENCY DEPARTMENT AT [facility] Provider Note  CSN: [id number] Arrival date & time: 06/28/23 2333  Chief Complaint(s) Leg Pain  HPI Jessica Mack is a 67 y.o. female with a past medical history listed below who presents to the emergency department with several days of right buttock/hamstring pain that became worse this evening.  Pain had been initially mild and constant.  Now cramping in nature.  Difficulty ambulating.  She denies any falls or trauma.  Endorses shooting pain from the buttock down to her foot.  Reports prior history of piriformis syndrome but states this is different.  No lower back pain.  No weakness.  The history is provided by the patient.    Past Medical History Past Medical History:  Diagnosis Date   Avascular necrosis of bone (HCC)    Left talus   Blood transfusion    Breast cancer (HCC) 2005   left   Cancer (HCC)    Dysplastic nevus 1988   right hip   Fatty liver    GERD (gastroesophageal reflux disease)    History of breast cancer 2005   invasive ductal cancer left breast   Hypertension    Hypertriglyceridemia    Irritable bowel syndrome    Metabolic syndrome    resolved with diet and exercise   NASH (nonalcoholic steatohepatitis)    Nocturnal leg cramps 02/14/2015   Osteopenia    Palpitations 09/2012   normal echo   Peripheral neuropathy    Personal history of chemotherapy    Personal history of radiation therapy    Plantar fasciitis 03/2003   PONV (postoperative nausea and vomiting)    Tongue dysplasia    Ulcer    Patient Active Problem List   Diagnosis Date Noted   Triceps tendon rupture, left, initial encounter 05/04/2023   Olecranon bursitis of left elbow 05/04/2023   Elevated ferritin level 04/05/2023   Mild persistent asthma without complication 09/28/2022   NASH (nonalcoholic steatohepatitis) 06/04/2020   Hypertriglyceridemia    Routine general medical examination at a health care facility 02/20/2020    Essential hypertension 02/20/2020   Osteopenia 08/05/2014   Breast cancer of upper-outer quadrant of left female breast (HCC) 07/20/2013   Leukoplakia of oral mucosa, including tongue 07/26/2012   Essential and other specified forms of tremor 07/26/2012   Drug-induced polyneuropathy (HCC) 07/26/2012   Home Medication(s) Prior to Admission medications   Medication Sig Start Date End Date Taking? Authorizing Provider  cyclobenzaprine (FLEXERIL) 10 MG tablet Take 1 tablet (10 mg total) by mouth at bedtime for 10 days. 06/29/23 07/09/23 Yes Demarian Epps, Amadeo Garnet, MD  baclofen (LIORESAL) 10 MG tablet Take 0.5-1 tablets (5-10 mg total) by mouth at bedtime as needed for muscle spasms. 07/13/22   Anson Fret, MD  clonazePAM (KLONOPIN) 1 MG tablet Take 1 tablet (1 mg total) by mouth at bedtime. 02/12/23   Etta Grandchild, MD  Estradiol 10 MCG TABS vaginal tablet 1 tablet vaginally twice weekly 07/06/22   Jerene Bears, MD  famciclovir [facility]) 500 MG tablet 3 tablets (1500mg ) at onset of fever blister symptoms 04/13/22   Jerene Bears, MD  famotidine (PEPCID) 10 MG tablet Take 10 mg by mouth daily.    [provider]  ketoconazole (NIZORAL) 2 % shampoo SMARTSIG:5 Milliliter(s) Topical 3 Times a Week 07/02/20   [provider]  mometasone (ELOCON) 0.1 % ointment Apply topically twice weekly 06/16/21   Jerene Bears, MD  Multiple Vitamins-Minerals (MULTIVITAMIN PO) Take  1 tablet by mouth daily. With calcium    [provider]  nebivolol (BYSTOLIC) 5 MG tablet TAKE 1 TABLET BY MOUTH DAILY 04/05/23   Etta Grandchild, MD  olmesartan (BENICAR) 5 MG tablet Take 2 tablets (10 mg total) by mouth daily. 04/05/23   Etta Grandchild, MD  omega-3 acid ethyl esters (LOVAZA) 1 g capsule TAKE TWO CAPSULES BY MOUTH TWICE DAILY 02/15/23   Etta Grandchild, MD  oxyCODONE (ROXICODONE) 5 MG immediate release tablet Take 1 tablet (5 mg total) by mouth every 4 (four) hours as needed for severe pain or  breakthrough pain. Patient not taking: Reported on 05/27/2023 05/04/23   [name], MD  Probiotic Product (ALIGN PO) Take 1 tablet by mouth daily.    [provider]  tirzepatide (ZEPBOUND) 5 MG/0.5ML Pen Inject 5 mg into the skin once a week.    [provider]                                                                                                                                    Allergies Augmentin [amoxicillin-pot clavulanate], Cephalexin, and Crestor [rosuvastatin]  Review of Systems Review of Systems As noted in HPI  Physical Exam Vital Signs  I have reviewed the triage vital signs BP 124/88 (BP Location: Right Arm)   Pulse 87   Temp 98.6 F (37 C) (Oral)   Resp 17   Ht 5\' 3"  (1.6 m)   Wt 66.2 kg   LMP 06/26/2004   SpO2 99%   BMI 25.86 kg/m   Physical Exam Vitals reviewed.  Constitutional:      General: She is not in acute distress.    Appearance: She is well-developed. She is not diaphoretic.  HENT:     Head: Normocephalic and atraumatic.     Right Ear: External ear normal.     Left Ear: External ear normal.     Nose: Nose normal.  Eyes:     General: No scleral icterus.    Conjunctiva/sclera: Conjunctivae normal.  Neck:     Trachea: Phonation normal.  Cardiovascular:     Rate and Rhythm: Normal rate and regular rhythm.  Pulmonary:     Effort: Pulmonary effort is normal. No respiratory distress.     Breath sounds: No stridor.  Abdominal:     General: There is no distension.  Musculoskeletal:        General: Normal range of motion.     Cervical back: Normal range of motion.     Right upper leg: Tenderness present. No edema or bony tenderness.     Left upper leg: No edema.     Right foot: Normal pulse.     Left foot: Normal pulse.       Legs:     Comments: Spine Exam: Strength: 5/5 throughout LE bilaterally  Sensation: Intact to light touch in proximal and distal LE bilaterally  Neurological:     Mental Status: She is  alert and oriented to person, place, and time.  Psychiatric:        Behavior: Behavior normal.     ED Results and Treatments Labs (all labs ordered are listed, but only abnormal results are displayed) Labs Reviewed  I-STAT CHEM 8, ED - Abnormal; Notable for the following components:      Result Value   BUN 26 (*)    Glucose, Bld 101 (*)    Calcium, Ion 1.14 (*)    TCO2 21 (*)    Hemoglobin 10.9 (*)    HCT 32.0 (*)    All other components within normal limits                                                                                                                         EKG  EKG Interpretation Date/Time:    Ventricular Rate:    PR Interval:    QRS Duration:    QT Interval:    QTC Calculation:   R Axis:      Text Interpretation:         Radiology No results found.  Medications Ordered in ED Medications  ketorolac (TORADOL) 15 MG/ML injection 7.5 mg (7.5 mg Intravenous Given 06/29/23 0119)  diazepam (VALIUM) injection 2.5 mg (2.5 mg Intravenous Given 06/29/23 0119)  dexamethasone (DECADRON) injection 6 mg (6 mg Intravenous Given 06/29/23 0119)  cyclobenzaprine (FLEXERIL) tablet 5 mg (5 mg Oral Given 06/29/23 0230)   Procedures Procedures  (including critical care time) Medical Decision Making / ED Course   Medical Decision Making Amount and/or Complexity of Data Reviewed Labs: ordered. Decision-making details documented in ED Course.  Risk Prescription drug management. Parenteral controlled substances.    Patient presents for right buttock and hamstring pain with radiation down to the leg.  No back pain or signs concerning for cauda equina.  No trauma requiring images at this time. Favored to be muscular in nature.  Though discussed possible herniated disc.  Check labs and K normal  Treated with IV Toradol, Decadron, small dose of Valium and oral Flexeril.  Given crutches.  Continue supportive management recommended with close sports medicine follow-up.     Final Clinical Impression(s) / ED Diagnoses Final diagnoses:  Strain of right hamstring muscle, initial encounter   The patient appears reasonably screened and/or stabilized for discharge and I doubt any other medical condition or other [facility] requiring further screening, evaluation, or treatment in the ED at this time. I have discussed the findings, Dx and Tx plan with the patient/family who expressed understanding and agree(s) with the plan. Discharge instructions discussed at length. The patient/family was given strict return precautions who verbalized understanding of the instructions. No further questions at time of discharge.  Disposition: Discharge  Condition: Good  ED Discharge Orders          Ordered    cyclobenzaprine (FLEXERIL) 10 MG tablet  Daily at bedtime  06/29/23 0257              Follow Up: Etta Grandchild, MD [address] [place] [address] [phone]  Call  to schedule an appointment for close follow up  [name], MD [address] [place] [address] [phone]  Call  to schedule an appointment for close follow up     This chart was dictated using voice recognition software.  Despite best efforts to proofread,  errors can occur which can change the documentation meaning.    Nira Conn, MD 06/29/23 [phone]

## 2023-06-29 NOTE — Discharge Instructions (Addendum)
You may use over-the-counter Motrin (Ibuprofen), Acetaminophen (Tylenol), topical muscle creams such as SalonPas, [employer], Bengay, etc. Please stretch, apply ice or heat (whichever helps), and have massage therapy for additional assistance.  For pain control you may take 1000 mg of acetaminophen (Tylenol) every 8 hours and/or 600 mg of Ibuprofen (Motrin, Advil, etc.) every 6-8 hours as needed.  Please limit acetaminophen (Tylenol) to 4000 mg and Ibuprofen (Motrin, Advil, etc.) to 2400 mg for a 24hr period. Please note that other over-the-counter medicine may contain acetaminophen or ibuprofen as a component of their ingredients.

## 2023-06-29 NOTE — Telephone Encounter (Signed)
Husband called stating wife had gone to ED yesterday; having a lot of pain and wondering if he can get her in with [facility] [phone] [facility]

## 2023-06-30 ENCOUNTER — Other Ambulatory Visit (HOSPITAL_BASED_OUTPATIENT_CLINIC_OR_DEPARTMENT_OTHER): Payer: Self-pay | Admitting: Orthopaedic Surgery

## 2023-06-30 DIAGNOSIS — S76311A Strain of muscle, fascia and tendon of the posterior muscle group at thigh level, right thigh, initial encounter: Secondary | ICD-10-CM

## 2023-06-30 NOTE — Telephone Encounter (Signed)
Responded via mychart

## 2023-06-30 NOTE — Therapy (Signed)
OUTPATIENT OCCUPATIONAL THERAPY ORTHO TREATMENT  Patient Name: Jessica Mack MRN: [id number] DOB:1956/06/12, 67 y.o., female Today's Date: 07/01/2023  PCP: Sanda Linger, MD REFERRING PROVIDER: [name], MD   END OF SESSION:  OT End of Session - 07/01/23 1002     Visit Number 7    Number of Visits 12    Date for OT Re-Evaluation 07/09/23    Authorization Type BC[employer]    OT Start Time 1002    OT Stop Time 1045    OT Time Calculation (min) 43 min    Equipment Utilized During Treatment massage/cupping, 5 & 6# weights, red putty    Activity Tolerance Patient tolerated treatment well;No increased pain    Behavior During Therapy [facility] for tasks assessed/performed             Past Medical History:  Diagnosis Date   Avascular necrosis of bone (HCC)    Left talus   Blood transfusion    Breast cancer (HCC) 2005   left   Cancer (HCC)    Dysplastic nevus 1988   right hip   Fatty liver    GERD (gastroesophageal reflux disease)    History of breast cancer 2005   invasive ductal cancer left breast   Hypertension    Hypertriglyceridemia    Irritable bowel syndrome    Metabolic syndrome    resolved with diet and exercise   NASH (nonalcoholic steatohepatitis)    Nocturnal leg cramps 02/14/2015   Osteopenia    Palpitations 09/2012   normal echo   Peripheral neuropathy    Personal history of chemotherapy    Personal history of radiation therapy    Plantar fasciitis 03/2003   PONV (postoperative nausea and vomiting)    Tongue dysplasia    Ulcer    Past Surgical History:  Procedure Laterality Date   ANKLE SURGERY  07/2007   left, revascularization   BREAST LUMPECTOMY  06/2004   left, with port placement   CESAREAN SECTION  1991, 1993   x2   COLONOSCOPY  09/2013   Dr. Loreta Ave   DG GALL BLADDER     IRRIGATION AND DEBRIDEMENT ELBOW Left 05/04/2023   Procedure: LEFT ELBOW IRRIGATION AND DEBRIDEMENT;  Surgeon: [name], MD;  Location: [facility];   Service: Orthopedics;  Laterality: Left;   LAPAROSCOPIC CHOLECYSTECTOMY  2003   MOUTH SURGERY  11/15   implant/crown placed 4/16   SHOULDER ARTHROSCOPY  10/2005   right   SHOULDER SURGERY  12/15   left   TONGUE SURGERY  2012, 2013   WRIST SURGERY  1988   right, DeQuervains   Patient Active Problem List   Diagnosis Date Noted   Triceps tendon rupture, left, initial encounter 05/04/2023   Olecranon bursitis of left elbow 05/04/2023   Elevated ferritin level 04/05/2023   Mild persistent asthma without complication 09/28/2022   NASH (nonalcoholic steatohepatitis) 06/04/2020   Hypertriglyceridemia    Routine general medical examination at a health care facility 02/20/2020   Essential hypertension 02/20/2020   Osteopenia 08/05/2014   Breast cancer of upper-outer quadrant of left female breast (HCC) 07/20/2013   Leukoplakia of oral mucosa, including tongue 07/26/2012   Essential and other specified forms of tremor 07/26/2012   Drug-induced polyneuropathy (HCC) 07/26/2012    ONSET DATE: 05/04/23 DOS elbow bursectomy and triceps repair   REFERRING DIAG: M70.22 (ICD-10-CM) - Olecranon bursitis of left elbow   THERAPY DIAG:  Pain in left elbow  Stiffness of left elbow, not elsewhere classified  Stiffness of left shoulder, not elsewhere classified  Muscle weakness (generalized)  Paresthesia of skin  Other lack of coordination  Localized edema  Rationale for Evaluation and Treatment: Rehabilitation  SUBJECTIVE:   SUBJECTIVE STATEMENT: 06/29/23 Note pt was recently seen in ED on 06/28/23 secondary to strain/pain of right hamstring vs a slipped disc/back injury. She has a referral to out-pt PT. Pt is using crutches and she is planning to switch to a cane asap and she is hoping to get an MRI emergently. She reports that she is doing her desensitization techs and her mobilization to her scar, she has not been doing her resistive exercises with wt since she has been using crutches at  home.   06/24/23: Pt reports that she is doing the reformer during pilates and is making adjustments as needed to avoid overuse/too much stress on L UE. Pt reports opening her pill bottle without difficulty and "I didn't even know I did it, until I had done it."  PERTINENT HISTORY: Per MD note 06/11/23: Assessment "She will continue to progress weightbearing and I have advised she can lift up to 10 pounds"  Per referral: "ROM of L elbow and desensitivity therapy "  Per MD orders, she needs "aggressive" scar mobilizations.  She has a history of an old wrist surgery, breast cancer treatments (radiation and chemo) that have left her with some peripheral neuropathy in feet and fingers- treatments were about 13 years ago.  She states retiring due to this paresthesia and pain in her hands and decreased fine motor skills  PRECAUTIONS: None  RED FLAGS: None   WEIGHT BEARING RESTRICTIONS: Yes ROM as tolerated with 10# restriction per MD notes, 06/11/23   PAIN:  Are you having pain? Yes, 4-5/10  Rt lower extremity, Lt elbow 0/10 NPRS scale Pain location: Rt LE, hamstring; Left olecranon/surgical area Pain description: Burning, stabbing, tight Aggravating factors: Pushing more aggressively moving quickly Relieving factors: Rest  FALLS: Has patient fallen in last 6 months? Yes. Number of falls 1 -this accident.  Not considered a fall risk.  LIVING ENVIRONMENT: Lives with: lives with their family Has following equipment at home: None  PLOF: Independent  PATIENT GOALS: To decrease pain and increase ability in motion and strength in the left elbow for daily activities  NEXT MD VISIT: 06/11/23   OBJECTIVE: (All objective assessments below are from initial evaluation on: 05/27/23 unless otherwise specified.)   HAND DOMINANCE: Right   ADLs: Overall ADLs: States decreased ability to grab, hold household objects, pain and inability to open containers, use knife to cut, mild to moderate bathing problems  as well.    FUNCTIONAL OUTCOME MEASURES: Eval: Quck DASH 47% impairment today  (Higher % Score  =  More Impairment)   06/22/23: 10%  UPPER EXTREMITY ROM     Shoulder to Wrist AROM Left eval  Shoulder flexion 140 (160 Rt)   Shoulder abduction   Shoulder extension   Shoulder internal rotation   Shoulder external rotation   Elbow flexion 117  Elbow extension (-7)  Forearm supination 79  Forearm pronation  80  Wrist flexion WNL  Wrist extension WNL  (Blank rows = not tested)   Hand AROM Left eval  Full Fist Ability (or Gap to Distal Palmar Crease) full  Thumb Opposition  (Kapandji Scale)  WNL  (Blank rows = not tested)   UPPER EXTREMITY MMT:    Eval: Based on observations and limited range of motion, her strength would be rated 3-/5 MMT for elbow  flexion and extension today, no resistance was applied due to 5 pound weight restriction.  Will be tested in detail once she is cleared by the doctor in about 2 weeks.  MMT Left 06/22/23 Left  07/01/23  Shoulder flexion    Shoulder abduction    Shoulder adduction    Shoulder extension    Shoulder internal rotation    Shoulder external rotation    Middle trapezius    Lower trapezius    Elbow flexion +3/5 4+/5  Elbow extension +3/5 w/ c/o pain 4/5 w/ c/o 1/10 pain improving  Forearm supination    Forearm pronation    Wrist flexion    Wrist extension    Wrist ulnar deviation    Wrist radial deviation    (Blank rows = not tested)  HAND FUNCTION: Eval: Observed weakness in affected hand.  Grip strength Right: 66 lbs, Left: 37 lbs  06/15/23: 53.6# Lt, 72.5# Rt 07/01/23: Grip Lt = 68#   COORDINATION: Eval: Observed coordination impairments with affected Lt arm, seen by inability to fully move the elbow and shoulder.  Details will be tested TBD. 06/22/23: Box and Blocks Test: Box and Blocks Lt = 62, WFL's. 07/01/23: 9 Hole Peg test: Rt = 23.16 sec vs Lt = 22.41 sec, WNL's  SENSATION: Eval: Around the left elbow surgical area,  she has hypersensitivity of her scar/nerves, does not tolerate light touch well, signs of allodynia.   She has a history of paresthesia in the hands and the feet that she states can feel very painful to her (chronic from 13 years ago breast cancer treatments).  EDEMA:   Eval:  Mildly swollen in Lt elbow today  COGNITION: Eval: Overall cognitive status: WFL for evaluation today   OBSERVATIONS:   Eval: Her surgical area is pink and closed, looks mildly irritated and has hypersensitivity to touch but no other signs of infection.  Diffuse swelling but no significant focal swelling.  She has limited strength and range of motion that is largely due to inhibiting pains and likely some mild scar adhesions.  Overall she presents as allodynia and hypersensitivity to her formerly infected left elbow and surgical area, mild to moderate scar adhesions around the elbow, stiffness pain and weakness to the left elbow that does travel up to the triceps and she does have some stiffness through the left shoulder as well.   TODAY'S TREATMENT:  07/01/23: Deferred UBE and standing resistive/strengthening exercises today secondary to pt awaiting MRI for suspected R hamstring vs slipped disc/back injury. [facility] assessment with 9 hole peg test Rt = 23.16 sec vs Lt = 22.41 sec (note pt has taken pain medication and muscle relaxer prior to therapy today). JAMAR grip assessment Lt = 68 lbs. Forearm flexion and extension with 5#-6# weight x1 sets of 10 each. Lt A/ROM forearm flexion/extension is WNL's. Scar desensitization/mobilization, cupping Lt elbow scar for 15 min. Upgraded putty ex's to red for grip and pinches today, pt is mod I as observed in clinic. Pt is Mod I HEP as well, discussed wearing clothing and perform desensitization techniques using this clothing on left elbow that has caused hypersensitivity in the past to gradually increase wear time and decrease hypersensitivity. MMT resisted elbow flexion (4+/5)/extension  (4/5) improved from 3-/5 at initial eval, with LTG of 4+/5 or better. Pt notes some tenderness with resisted elbow extension and rates it as a 1/10 on a scale of 0-10. This is much improved. Pt is currently using a w/c, crutches and hopes to  progress to a cane asap, but pain in her R LE is currently a limiting factor. Pt denies any pain or limitations when using LUE and crutches at this time. Discussed d/c planning with pt as she appears to be close to discharge at this time. She verbalized agreement with this.  06/24/23 UBE x36min, increased resistance to level 2, rotating forward and back with bilateral UE's for reciprocal A/ROM/stretch throughout her Lt UE. Functional reaching activities into overhead cabinets, grasp and release of 3 & 4# weights x 2 sets of 10 on bottom and middle shelf w/o c/o. Discussed increasing to 5# wts at home after this weekend if able. Verbal review of HEP, increased putty resistence and functional activity at home and during leisure activities. Pt is Mod I and is grading activity as needed. Pt is currently using her Lt UE for all ADL's and activity. Functional tasks appear to be getting easier per her report. Scar desensitization/mobilization, vibration & cupping Lt elbow scar for 15 min. Pt is able to tolerate increased mobilization and desensitization techniques with rougher objects noted today. Added hook/velcro to home desensitization tool kit as pt tolerated light tapping in clinic without adverse effect.   06/22/23: Re-assessment: Quick DASH score = 10%, much improved from initial visit/assessment; 06/22/23: Box and Blocks Lt = 62, WFL's. Assessed LTG's (see goals below for details). A/ROM in Lt elbow flex/ext is improved today 146/ (-4) noted. UBE deferred as pt arrived a little late since she was playing pickle-ball. Reviewed HEP for strength and stretching. Pt notes tenderness with resisted elbow flexion/extension as compared to her initial assessment, overall MMT 3+/5 from  3-/5 with LTG of 4+/5 or better. Scar desensitization/mobilization & cupping especially along proximal elbow scar for 10 min. She continues to have some hypersensitivity along her proximal scar noted, but this is slowly improving from her initial visit. Pt appears independent with HEP and strengthening with putty, after verbal discussion in clinic today, she is going to progress to non-weighted pilates exercises at her next pilates session. We discussed only to do this if she is pain free as she continues to have 10# restriction for her elbow per MD orders.   Patient Education Access Code: [email or web address] URL: [email or web address] Date: 06/17/2023 Prepared by: Mariam Dollar  Exercises - "Raise the Roof"   - 4-6 x daily - 1-2 sets - 5-10 reps - Bend and Straighten Elbow  - 3-4 x daily - 1-2 sets - 10-15 reps - Palm Up / Palm Down  - 3-4 x daily - 1-2 sets - 10-15 reps - Bend and Pull Back Wrist SLOWLY  - 3-4 x daily - 1-2 sets - 10 reps - Putty Squeezes  - 1 x daily - 7 x weekly - 1 sets - 5-10 reps - Key Pinch with Putty  - 1 x daily - 7 x weekly - 1 sets - 5 reps - 3-Point Pinch with Putty  - 1 x daily - 7 x weekly - 1 sets - 5 reps - Scar Massage/mobilizations & systematic desensitization for neuromuscular rehabilitation.   PATIENT EDUCATION: Education details: See tx section above for details  Person educated: Patient Education method: Verbal Instruction, Teach back, Handouts  Education comprehension: States and demonstrates understanding, Additional Education required    HOME EXERCISE PROGRAM: Access Code: [email or web address] URL: [email or web address] Date: 06/17/2023 Prepared by: Mariam Dollar   GOALS: Goals reviewed with patient? Yes   SHORT TERM GOALS: (STG required if POC>30 days) Target Date: 06/11/23  Pt will obtain protective, custom  orthotic. Goal status: N/A/Met  2.  Pt will demo/state understanding of initial HEP to improve pain levels and prerequisite  motion. Goal status: Met 06/10/23   LONG TERM GOALS: Target Date: 07/09/23  Pt will improve functional ability by decreased impairment per Quick DASH assessment from 47% to 15% or better, for better quality of life. Goal status: 06/22/23: 10% Met  2.  Pt will improve grip strength in Lt hand from 37lbs to at least 55lbs for functional use at home and in IADLs. Goal status: Met  06/15/23: 53.6# Lt, 72.5# Rt; 07/01/23: Lt 68#  3.  Pt will improve A/ROM in Lt elbow flex/ext from 117 / (-7)  to at least 140 / (-5), to have functional motion for tasks like reach and grasp.  Goal status: 06/22/23: 146/ (-4), Met  4.  Pt will improve strength in Lt elbow flex and ext from painful 3-/5 MMT to at least 4+/5 MMT to have increased functional ability to carry out selfcare and higher-level homecare tasks with no difficulty. Goal status: Progressing 06/22/23: +3/5 MMT; 07/01/23: resisted elbow flexion (4+/5)/extension (4/5) Progressing  5.  Pt will improve coordination skills in Lt arm, as seen by [facility] score on Box and Blocks testing to have increased functional ability to carry out fine motor tasks (fasteners, etc.) and more complex, coordinated IADLs (meal prep, sports, etc.).  Goal status:  06/22/23: 62, WFL's/Progressing 07/01/23: 9 Hole Peg Test: Rt = 23.16 sec vs Lt = 22.41 sec WFL's/Met  6.  Pt will decrease pain at worst from 8/10 to 3/10 or better to have better sleep and occupational participation in daily roles. Goal status: 06/22/23: Pt report 0/10 with functional activity, but occasionally 3/10 when using vibrator. Met  ASSESSMENT:  CLINICAL IMPRESSION: 07/01/23: Pt presented to ER on 06/28/23 with RLE pain. Deferred UBE and standing resistive/strengthening exercises today secondary to pt awaiting MRI for suspected Rt hamstring vs slipped disc/back injury. Pt appears to be Mod I HEP, strengthening, desensitization to Lt elbow. Her HEP was upgraded today to include continued scar desensitization using  clothing and materials that have bothered her in the past as well as for increased resistance with red putty for grip and pinch. Began discharge planning with pt today as she continues to meet her LTG's. Recheck LTG #4 and #6 next visit with anticipated d/c to independent HEP over next 1-2 visits. Pt is in agreement with this. She is limited by pain in her RLE today noted.  PERFORMANCE DEFICITS: in functional skills including ADLs, IADLs, coordination, proprioception, sensation, edema, ROM, strength, pain, fascial restrictions, muscle spasms, flexibility, Gross motor control, body mechanics, endurance, decreased knowledge of precautions, skin integrity, and UE functional use, cognitive skills including  none , and psychosocial skills including coping strategies and habits.   IMPAIRMENTS: are limiting patient from ADLs, IADLs, rest and sleep, leisure, and social participation.   COMORBIDITIES: may have co-morbidities  that affects occupational performance. Patient will benefit from skilled OT to address above impairments and improve overall function.  MODIFICATION OR ASSISTANCE TO COMPLETE EVALUATION: No modification of tasks or assist necessary to complete an evaluation.  OT OCCUPATIONAL PROFILE AND HISTORY: Problem focused assessment: Including review of records relating to presenting problem.  CLINICAL DECISION MAKING: Moderate - several treatment options, min-mod task modification necessary  REHAB POTENTIAL: Excellent  EVALUATION COMPLEXITY: Low      PLAN:  OT FREQUENCY: 2x/week as able   OT DURATION: 6 weeks (through 07/09/23 as needed)   PLANNED INTERVENTIONS: self care/ADL  training, therapeutic exercise, therapeutic activity, neuromuscular re-education, manual therapy, scar mobilization, passive range of motion, splinting, compression bandaging, moist heat, cryotherapy, contrast bath, patient/family education, energy conservation, coping strategies training, Re-evaluation, and Dry  needling  RECOMMENDED OTHER SERVICES: none now   CONSULTED AND AGREED WITH PLAN OF CARE: Patient  PLAN FOR NEXT SESSION:  07/01/23 Check LTG's #4 and #6, discharge next 1-2 visits to independent HEP as able. Review and upgrade HEP as tolerated.  Mariam Dollar Beth Dixon, OTR/L  07/01/2023, 11:26 AM

## 2023-07-01 ENCOUNTER — Ambulatory Visit: Payer: PPO | Admitting: Occupational Therapy

## 2023-07-01 ENCOUNTER — Ambulatory Visit: Payer: PPO | Admitting: Sports Medicine

## 2023-07-01 ENCOUNTER — Encounter: Payer: Self-pay | Admitting: Occupational Therapy

## 2023-07-01 ENCOUNTER — Encounter: Payer: Self-pay | Admitting: Sports Medicine

## 2023-07-01 ENCOUNTER — Ambulatory Visit (HOSPITAL_COMMUNITY)
Admission: RE | Admit: 2023-07-01 | Discharge: 2023-07-01 | Disposition: A | Discharge status: Home / Self Care | Payer: PPO | Source: Ambulatory Visit | Attending: Sports Medicine | Admitting: Sports Medicine

## 2023-07-01 ENCOUNTER — Ambulatory Visit (INDEPENDENT_AMBULATORY_CARE_PROVIDER_SITE_OTHER): Payer: Self-pay

## 2023-07-01 ENCOUNTER — Ambulatory Visit: Payer: PPO | Admitting: Internal Medicine

## 2023-07-01 ENCOUNTER — Encounter: Payer: BC Managed Care – PPO | Admitting: Occupational Therapy

## 2023-07-01 DIAGNOSIS — M25622 Stiffness of left elbow, not elsewhere classified: Secondary | ICD-10-CM | POA: Diagnosis not present

## 2023-07-01 DIAGNOSIS — M25522 Pain in left elbow: Secondary | ICD-10-CM

## 2023-07-01 DIAGNOSIS — G8929 Other chronic pain: Secondary | ICD-10-CM

## 2023-07-01 DIAGNOSIS — M25612 Stiffness of left shoulder, not elsewhere classified: Secondary | ICD-10-CM

## 2023-07-01 DIAGNOSIS — M5116 Intervertebral disc disorders with radiculopathy, lumbar region: Secondary | ICD-10-CM | POA: Diagnosis not present

## 2023-07-01 DIAGNOSIS — M6281 Muscle weakness (generalized): Secondary | ICD-10-CM | POA: Diagnosis not present

## 2023-07-01 DIAGNOSIS — R278 Other lack of coordination: Secondary | ICD-10-CM

## 2023-07-01 DIAGNOSIS — R262 Difficulty in walking, not elsewhere classified: Secondary | ICD-10-CM

## 2023-07-01 DIAGNOSIS — R202 Paresthesia of skin: Secondary | ICD-10-CM

## 2023-07-01 DIAGNOSIS — R600 Localized edema: Secondary | ICD-10-CM

## 2023-07-01 DIAGNOSIS — M5441 Lumbago with sciatica, right side: Secondary | ICD-10-CM | POA: Insufficient documentation

## 2023-07-01 MED ORDER — METHYLPREDNISOLONE 4 MG PO TBPK: ORAL_TABLET | ORAL | 0 refills | Status: DC

## 2023-07-01 MED ORDER — CYCLOBENZAPRINE HCL 10 MG PO TABS: 10.0000 mg | ORAL_TABLET | Freq: Three times a day (TID) | ORAL | 1 refills | Status: DC | PRN

## 2023-07-01 NOTE — Progress Notes (Signed)
Jessica Mack - 66 y.o. female MRN [id number]  Date of birth: [birth year]  Office Visit Note: Visit Date: 07/01/2023 PCP: Jessica Grandchild, MD Referred by: Jessica Grandchild, MD  Subjective: Chief Complaint  Patient presents with   Right Leg - Pain   HPI: Jessica Mack is a pleasant 67 y.o. female who presents today for acute right leg/back pain with radiculitis.  She is a prior OB/GYN physician.  She has had about 2 weeks of right low back pain and posterior buttock pain.  No specific inciting event.  Here over the last week her pain has been rather excruciating.  She was seen on the ED on 06/28/2023 as she was having shooting pain from her buttock all the way down the posterior thigh and posterior lateral calf.  Reporting numbness and tingling and electric like pain.  Her pain has been so bad that she is having difficulty walking, noticing some weakness with walking of the right leg.  She has to hold her hip and leg in a flexed position to avoid the pain.  She has been using crutches since the ED this is causing her other pains.  In the ED they diagnosed her with a hamstring strain - Note reviewed from 06/28/23 - was treated with IV Toradol, Decadron, oral Flexeril.  Alera tells me today her pain continues to be worsening.  She is rotating between NSAIDs, Flexeril and oxycodone but still having significant pain.  She denies any bowel or bladder incontinence, no fever or chills.  Pertinent ROS were reviewed with the patient and found to be negative unless otherwise specified above in HPI.   Assessment & Plan: Visit Diagnoses:  1. Chronic right-sided low back pain with right-sided sciatica   2. Difficulty walking   3. Lumbar disc prolapse with compression radiculopathy    Plan: Discussed with Jessica Mack the nature of her acute pain and radicular symptoms down the right leg is most indicative of likely disc protrusion/herniation with neural compression.  Her physical exam findings are specific for a L5,  possibly S1 radiculitis.  Given the degree of her pain, difficulty walking and weakness, I do think it is pertinent to obtain a stat MRI to further evaluate.  Discussed depending on MRI results, could be amenable to a fluoroscopy guided ESI lumbar injection.  May benefit from surgical discussion with Jessica Mack but will hold until MRI results.  In the meantime for pain, we will place her on a 6-day methylprednisolone taper.  She may use gabapentin 300 mg 3 times daily as needed.  Okay for Flexeril 10 mg once to 3 times daily as needed, did caution her on the sedate of side effects.  Strict return precautions provided for red flag symptoms.  I will call her with results of the MRI.  Follow-up: Return for will call once MRI results to discuss next steps.   Meds & Orders:  Meds ordered this encounter  Medications   cyclobenzaprine (FLEXERIL) 10 MG tablet    Sig: Take 1 tablet (10 mg total) by mouth 3 (three) times daily as needed for muscle spasms.    Dispense:  30 tablet    Refill:  1   methylPREDNISolone (MEDROL DOSEPAK) 4 MG TBPK tablet    Sig: Take per packet instructions. Taper dosing.    Dispense:  1 each    Refill:  0   gabapentin (NEURONTIN) 300 MG capsule    Sig: Take 1 capsule (300 mg total) by mouth 3 (three)  times daily.    Dispense:  90 capsule    Refill:  0    Orders Placed This Encounter  Procedures   XR Lumbar Spine Complete   MR Lumbar Spine w/o contrast     Procedures: No procedures performed      Clinical History: No specialty comments available.  She reports that she has never smoked. She has never used smokeless tobacco.  Recent Labs    04/02/23 0732  HGBA1C 5.5    Objective:   Vital Signs: LMP 06/26/2004   Physical Exam  Gen: Well-appearing, in no acute distress; non-toxic CV: Well-perfused. Warm.  Resp: Breathing unlabored on room air; no wheezing. Psych: Fluid speech in conversation; appropriate affect; normal thought process Neuro: Sensation intact  throughout. No gross coordination deficits.   Ortho Exam - Lumbar/RLE: There is mild tenderness to palpation over the right side of the low back near the L5 region.  There is grossly positive straight leg raise and modified slump's testing.  Patient has to hold the leg in a flexed position to avoid her radicular shooting pain.  Strength testing is difficult to assess given her guarding and her pain as she has discomfort in many positions.  There is an intact patellar reflex.  Patient with difficulty ambulating, presented into the room in her wheelchair.  Imaging:  - Complete view x-ray of the lumbar spine including AP, lateral,  flexion/extension views were ordered and reviewed by myself today.  X-rays  demonstrate multilevel spondylosis most notable at L4-L5 and L3-L4.  There  is anterior spurring noted at these locations with some endplate sclerosis  between L4 and L5.  There is no retro-/anterograde listhesis with  flexion/extension views.  No acute compression fracture noted.  Past Medical/Family/Surgical/Social History: Medications & Allergies reviewed per EMR, new medications updated. Patient Active Problem List   Diagnosis Date Noted   Triceps tendon rupture, left, initial encounter 05/04/2023   Olecranon bursitis of left elbow 05/04/2023   Elevated ferritin level 04/05/2023   Mild persistent asthma without complication 09/28/2022   NASH (nonalcoholic steatohepatitis) 06/04/2020   Hypertriglyceridemia    Routine general medical examination at a health care facility 02/20/2020   Essential hypertension 02/20/2020   Osteopenia 08/05/2014   Breast cancer of upper-outer quadrant of left female breast (HCC) 07/20/2013   Leukoplakia of oral mucosa, including tongue 07/26/2012   Essential and other specified forms of tremor 07/26/2012   Drug-induced polyneuropathy (HCC) 07/26/2012   Past Medical History:  Diagnosis Date   Avascular necrosis of bone (HCC)    Left talus   Blood  transfusion    Breast cancer (HCC) 2005   left   Cancer (HCC)    Dysplastic nevus 1988   right hip   Fatty liver    GERD (gastroesophageal reflux disease)    History of breast cancer 2005   invasive ductal cancer left breast   Hypertension    Hypertriglyceridemia    Irritable bowel syndrome    Metabolic syndrome    resolved with diet and exercise   NASH (nonalcoholic steatohepatitis)    Nocturnal leg cramps 02/14/2015   Osteopenia    Palpitations 09/2012   normal echo   Peripheral neuropathy    Personal history of chemotherapy    Personal history of radiation therapy    Plantar fasciitis 03/2003   PONV (postoperative nausea and vomiting)    Tongue dysplasia    Ulcer    Family History  Problem Relation Age of Onset   Thyroid disease  Mother    Hyperlipidemia Mother    Hypertension Mother    Diabetes Mother    Cancer Mother        melanoma   Heart disease Mother    Depression Mother    Sleep apnea Mother    Obesity Mother    Heart disease Father    Hyperlipidemia Father    Hypertension Father    Cancer Father        colon   Prostate cancer Father    Kidney disease Father    Asthma Sister    Asthma Brother    Breast cancer Neg Hx    Past Surgical History:  Procedure Laterality Date   ANKLE SURGERY  07/2007   left, revascularization   BREAST LUMPECTOMY  06/2004   left, with port placement   CESAREAN SECTION  1991, 1993   x2   COLONOSCOPY  09/2013   Dr. Loreta Ave   DG GALL BLADDER     IRRIGATION AND DEBRIDEMENT ELBOW Left 05/04/2023   Procedure: LEFT ELBOW IRRIGATION AND DEBRIDEMENT;  Surgeon: [name], MD;  Location: [facility];  Service: Orthopedics;  Laterality: Left;   LAPAROSCOPIC CHOLECYSTECTOMY  2003   MOUTH SURGERY  11/15   implant/crown placed 4/16   SHOULDER ARTHROSCOPY  10/2005   right   SHOULDER SURGERY  12/15   left   TONGUE SURGERY  2012, 2013   WRIST SURGERY  1988   right, [place]   Social History   Occupational  History    Employer: [employer]   Occupation: MD/Adjunct Professor  Tobacco Use   Smoking status: Never   Smokeless tobacco: Never  Vaping Use   Vaping status: Never Used  Substance and Sexual Activity   Alcohol use: Yes    Alcohol/week: 7.0 standard drinks of alcohol    Types: 7 Standard drinks or equivalent per week   Drug use: No   Sexual activity: Yes    Partners: Male    Birth control/protection: Post-menopausal    Comment: husband vasectomy   Addendum (07/02/23): Stat MRI resulted which has a paracentral disc protrusion that is large and has gross impingement of the exiting L5 nerve root in the lateral recess.  There is also posterior disc bulge at the L5-S1 level with rather significant right neural foraminal narrowing.  Discussed these results with Maurice March and had a discussion with my partner, Dr. Alvester Morin, we will work to get her in on Monday morning for planned L5-S1 interlaminar injection.  Depending on these results, could consider a lateral recess guided injection.  Will see her response to this, but may be wise to get her seen by our spine physician Jessica Mack in case injection therapy does not give her markedly improved.  MRI - IMPRESSION: 1. Right paracentral disc protrusion at L4-L5 severely narrows the right lateral recess. 2. Severe right neural foraminal narrowing at L5-S1 secondary to a right foraminal disc protrusion. 3. Chronic nonspecific inward buckling of the anterior abdominal wall on the right. Recommend correlation with physical exam.

## 2023-07-02 ENCOUNTER — Encounter: Payer: Self-pay | Admitting: Sports Medicine

## 2023-07-02 ENCOUNTER — Other Ambulatory Visit: Payer: Self-pay | Admitting: Physical Medicine and Rehabilitation

## 2023-07-02 DIAGNOSIS — M5416 Radiculopathy, lumbar region: Secondary | ICD-10-CM

## 2023-07-02 MED ORDER — GABAPENTIN 300 MG PO CAPS: 300.0000 mg | ORAL_CAPSULE | Freq: Three times a day (TID) | ORAL | 0 refills | Status: DC

## 2023-07-05 ENCOUNTER — Encounter: Payer: BC Managed Care – PPO | Admitting: Rehabilitative and Restorative Service Providers"

## 2023-07-05 ENCOUNTER — Other Ambulatory Visit: Payer: Self-pay

## 2023-07-05 ENCOUNTER — Ambulatory Visit: Payer: PPO | Admitting: Physical Medicine and Rehabilitation

## 2023-07-05 VITALS — BP 129/87 | HR 78

## 2023-07-05 DIAGNOSIS — M5116 Intervertebral disc disorders with radiculopathy, lumbar region: Secondary | ICD-10-CM

## 2023-07-05 DIAGNOSIS — M5416 Radiculopathy, lumbar region: Secondary | ICD-10-CM

## 2023-07-05 MED ORDER — METHYLPREDNISOLONE ACETATE 80 MG/ML IJ SUSP: 80.0000 mg | Freq: Once | INTRAMUSCULAR | Status: DC

## 2023-07-05 NOTE — Progress Notes (Signed)
Functional Pain Scale - descriptive words and definitions  Unmanageable (7)  Pain interferes with normal ADL's/nothing seems to help/sleep is very difficult/active distractions are very difficult to concentrate on. Severe range order  Average Pain 8   +Driver, -BT, -Dye Allergies.  Lower back pain on right side. Pain is more in right buttocks with radiation in the right leg to the foot and numbness in lower leg

## 2023-07-05 NOTE — Procedures (Signed)
Lumbar Epidural Steroid Injection - Interlaminar Approach with Fluoroscopic Guidance  Patient: Jessica Mack      Date of Birth: Apr 30, 1956 MRN: [id number] PCP: Etta Grandchild, MD      Visit Date: 07/05/2023   Universal Protocol:     Consent Given By: the patient  Position: PRONE  Additional Comments: Vital signs were monitored before and after the procedure. Patient was prepped and draped in the usual sterile fashion. The correct patient, procedure, and site was verified.   Injection Procedure Details:   Procedure diagnoses: Lumbar radiculopathy [M54.16]   Meds Administered:  Meds ordered this encounter  Medications   methylPREDNISolone acetate (DEPO-MEDROL) injection 80 mg     Laterality: Right  Location/Site:  L5-S1  Needle: 3.5 in., 20 ga. Tuohy  Needle Placement: Paramedian epidural  Findings:   -Comments: Excellent flow of contrast into the epidural space.  We were unable to position the patient very well but we were able to roll her up in a right somewhat decubitus position.  I was able to oblique the C arm over to get a good picture of the spine.  Other than positioning the injection went without difficulty.  She seemed to have some relief during the anesthetic phase and when she was wheeled out she had her leg more straight than when she came in.  Procedure Details: Using a paramedian approach from the side mentioned above, the region overlying the inferior lamina was localized under fluoroscopic visualization and the soft tissues overlying this structure were infiltrated with 4 ml. of 1% Lidocaine without Epinephrine. The Tuohy needle was inserted into the epidural space using a paramedian approach.   The epidural space was localized using loss of resistance along with counter oblique bi-planar fluoroscopic views.  After negative aspirate for air, blood, and CSF, a 2 ml. volume of Isovue-250 was injected into the epidural space and the flow of contrast was  observed. Radiographs were obtained for documentation purposes.    The injectate was administered into the level noted above.   Additional Comments:   Dressing: 2 x 2 sterile gauze and Band-Aid    Post-procedure details: Patient was observed during the procedure. Post-procedure instructions were reviewed.  Patient left the clinic in stable condition.

## 2023-07-05 NOTE — Patient Instructions (Signed)

## 2023-07-05 NOTE — Progress Notes (Signed)
Jessica Mack - 67 y.o. female MRN [id number]  Date of birth: [birth year]  Office Visit Note: Visit Date: 07/05/2023 PCP: Etta Grandchild, MD Referred by: Madelyn Brunner, DO  Subjective: Chief Complaint  Patient presents with   Lower Back - Pain   HPI:  Jessica Mack is a 67 y.o. female who comes in today at the request of Dr. Madelyn Brunner for planned Right L5-S1 Lumbar Interlaminar epidural steroid injection with fluoroscopic guidance.  The patient has failed conservative care including home exercise, medications, time and activity modification.  This injection will be diagnostic and hopefully therapeutic.  Please see requesting physician notes for further details and justification.  She is in a wheelchair today Duda pain and she has her right leg flexed at the knee and flexed at the hip with pretty excruciating pain despite medication management.  Denies any left-sided complaints.  Exam today shows no strength loss.  She comes in today for pretty urgent injection given the amount of pain that she is having.  MRI which was done stat is reviewed below.  This does show paracentral disc protrusion at L4-5 but also lateral to me extraforaminal but dictated as foraminal herniation at L5-S1.  More of her pain with standing and sitting that could be more of the lateral disc that is painful.  Depending on relief today we could look at a transforaminal approach although the stenosis there is pretty tight.   ROS Otherwise per HPI.  Assessment & Plan: Visit Diagnoses:    ICD-10-CM   1. Lumbar radiculopathy  M54.16 XR C-ARM NO REPORT    Epidural Steroid injection    methylPREDNISolone acetate (DEPO-MEDROL) injection 80 mg    2. Radiculopathy due to lumbar intervertebral disc disorder  M51.16       Plan: No additional findings.   Meds & Orders:  Meds ordered this encounter  Medications   methylPREDNISolone acetate (DEPO-MEDROL) injection 80 mg    Orders Placed This Encounter  Procedures   XR  C-ARM NO REPORT   Epidural Steroid injection    Follow-up: Return for visit to requesting provider as needed.   Procedures: No procedures performed  Lumbar Epidural Steroid Injection - Interlaminar Approach with Fluoroscopic Guidance  Patient: Jessica Mack      Date of Birth: 1956/07/25 MRN: [id number] PCP: Etta Grandchild, MD      Visit Date: 07/05/2023   Universal Protocol:     Consent Given By: the patient  Position: PRONE  Additional Comments: Vital signs were monitored before and after the procedure. Patient was prepped and draped in the usual sterile fashion. The correct patient, procedure, and site was verified.   Injection Procedure Details:   Procedure diagnoses: Lumbar radiculopathy [M54.16]   Meds Administered:  Meds ordered this encounter  Medications   methylPREDNISolone acetate (DEPO-MEDROL) injection 80 mg     Laterality: Right  Location/Site:  L5-S1  Needle: 3.5 in., 20 ga. Tuohy  Needle Placement: Paramedian epidural  Findings:   -Comments: Excellent flow of contrast into the epidural space.  We were unable to position the patient very well but we were able to roll her up in a right somewhat decubitus position.  I was able to oblique the C arm over to get a good picture of the spine.  Other than positioning the injection went without difficulty.  She seemed to have some relief during the anesthetic phase and when she was wheeled out she had her leg more straight than when she  came in.  Procedure Details: Using a paramedian approach from the side mentioned above, the region overlying the inferior lamina was localized under fluoroscopic visualization and the soft tissues overlying this structure were infiltrated with 4 ml. of 1% Lidocaine without Epinephrine. The Tuohy needle was inserted into the epidural space using a paramedian approach.   The epidural space was localized using loss of resistance along with counter oblique bi-planar fluoroscopic  views.  After negative aspirate for air, blood, and CSF, a 2 ml. volume of Isovue-250 was injected into the epidural space and the flow of contrast was observed. Radiographs were obtained for documentation purposes.    The injectate was administered into the level noted above.   Additional Comments:   Dressing: 2 x 2 sterile gauze and Band-Aid    Post-procedure details: Patient was observed during the procedure. Post-procedure instructions were reviewed.  Patient left the clinic in stable condition.   Clinical History: MRI LUMBAR SPINE WITHOUT CONTRAST   TECHNIQUE: Multiplanar, multisequence MR imaging of the lumbar spine was performed. No intravenous contrast was administered.   COMPARISON:  None Available.   FINDINGS: Segmentation:  Standard.   Alignment:  Straightening of the normal lumbar lordosis.   Vertebrae: No fracture, evidence of discitis, or bone lesion. Degenerative endplate changes at the inferior and superior endplates of L4 on L5, as well as L5 on S1.   Conus medullaris and cauda equina: Conus extends to the L1-L2 disc space level. Conus and cauda equina appear normal.   Paraspinal and other soft tissues: There is nonspecific inward buckling of the anterior abdominal wall on the right (series 9, image 30).   Disc levels:   T12-L1: Unremarkable   L1-L2: Mild bilateral facet degenerative change. No significant disc bulge. No spinal canal narrowing. No neural foraminal narrowing.   L2-L3: No significant disc bulge. Mild bilateral facet degenerative change. No spinal canal narrowing. Mild right neural foraminal narrowing.   L3-L4: Circumferential disc bulge. Mild bilateral facet degenerative change. Mild spinal canal narrowing. Mild right neural foraminal narrowing.   L4-L5: Circumferential disc bulge with a right paracentral disc protrusion that obliterates the right lateral recess. Mild overall spinal canal narrowing. Moderate right and mild left  neural foraminal narrowing.   L5-S1: Eccentric left disc bulge with a right foraminal disc protrusion. Mild narrowing of the left lateral recess. Mild bilateral facet degenerative change. Severe right neural foraminal narrowing and mild left neural foraminal narrowing.   IMPRESSION: 1. Right paracentral disc protrusion at L4-L5 severely narrows the right lateral recess. 2. Severe right neural foraminal narrowing at L5-S1 secondary to a right foraminal disc protrusion. 3. Chronic nonspecific inward buckling of the anterior abdominal wall on the right. Recommend correlation with physical exam.     Electronically Signed   By: Lorenza Cambridge M.D.   On: 07/01/2023 18:06     Objective:  VS:  HT:    WT:   BMI:     BP:129/87  HR:78bpm  TEMP: ( )  RESP:  Physical Exam Vitals and nursing note reviewed.  Constitutional:      General: She is not in acute distress.    Appearance: Normal appearance. She is not ill-appearing.  HENT:     Head: Normocephalic and atraumatic.     Right Ear: External ear normal.     Left Ear: External ear normal.  Eyes:     Extraocular Movements: Extraocular movements intact.  Cardiovascular:     Rate and Rhythm: Normal rate.  Pulses: Normal pulses.  Pulmonary:     Effort: Pulmonary effort is normal. No respiratory distress.  Abdominal:     General: There is no distension.     Palpations: Abdomen is soft.  Musculoskeletal:        General: Tenderness present.     Cervical back: Neck supple.     Right lower leg: No edema.     Left lower leg: No edema.     Comments: Patient has good distal strength with no pain over the greater trochanters.  No clonus or focal weakness.  In a wheelchair today in the office testicular pain.  She wants to have her leg flexed at the knee and hip.  She has a lot of nerve tension signs with any sort of movement of the hip.  No groin pain.  Skin:    Findings: No erythema, lesion or rash.  Neurological:     General: No  focal deficit present.     Mental Status: She is alert and oriented to person, place, and time.     Cranial Nerves: No cranial nerve deficit.     Sensory: Sensory deficit present.     Motor: No weakness or abnormal muscle tone.     Coordination: Coordination normal.     Gait: Gait abnormal.  Psychiatric:        Mood and Affect: Mood normal.        Behavior: Behavior normal.      Imaging: XR C-ARM NO REPORT  Result Date: 07/05/2023 Please see Notes tab for imaging impression.

## 2023-07-06 ENCOUNTER — Ambulatory Visit (INDEPENDENT_AMBULATORY_CARE_PROVIDER_SITE_OTHER): Payer: PPO | Admitting: Internal Medicine

## 2023-07-06 ENCOUNTER — Encounter: Payer: Self-pay | Admitting: Internal Medicine

## 2023-07-06 ENCOUNTER — Encounter: Payer: BC Managed Care – PPO | Admitting: Rehabilitative and Restorative Service Providers"

## 2023-07-06 VITALS — BP 122/84 | HR 85 | Temp 98.1°F | Resp 16 | Ht 63.0 in | Wt 150.0 lb

## 2023-07-06 DIAGNOSIS — I950 Idiopathic hypotension: Secondary | ICD-10-CM | POA: Insufficient documentation

## 2023-07-06 DIAGNOSIS — R7989 Other specified abnormal findings of blood chemistry: Secondary | ICD-10-CM | POA: Diagnosis not present

## 2023-07-06 DIAGNOSIS — D539 Nutritional anemia, unspecified: Secondary | ICD-10-CM | POA: Insufficient documentation

## 2023-07-06 DIAGNOSIS — Z23 Encounter for immunization: Secondary | ICD-10-CM | POA: Diagnosis not present

## 2023-07-06 LAB — CBC WITH DIFFERENTIAL/PLATELET
Basophils Absolute: 0 10*3/uL (ref 0.0–0.1)
Basophils Relative: 0.2 % (ref 0.0–3.0)
Eosinophils Absolute: 0 10*3/uL (ref 0.0–0.7)
Eosinophils Relative: 0.2 % (ref 0.0–5.0)
HCT: 42.8 % (ref 36.0–46.0)
Hemoglobin: 14 g/dL (ref 12.0–15.0)
Lymphocytes Relative: 17.4 % (ref 12.0–46.0)
Lymphs Abs: 1.3 10*3/uL (ref 0.7–4.0)
MCHC: 32.6 g/dL (ref 30.0–36.0)
MCV: 95.2 fl (ref 78.0–100.0)
Monocytes Absolute: 0.4 10*3/uL (ref 0.1–1.0)
Monocytes Relative: 4.9 % (ref 3.0–12.0)
Neutro Abs: 5.6 10*3/uL (ref 1.4–7.7)
Neutrophils Relative %: 77.3 % — ABNORMAL HIGH (ref 43.0–77.0)
Platelets: 266 10*3/uL (ref 150.0–400.0)
RBC: 4.5 Mil/uL (ref 3.87–5.11)
RDW: 13.3 % (ref 11.5–15.5)
WBC: 7.2 10*3/uL (ref 4.0–10.5)

## 2023-07-06 LAB — FOLATE: Folate: 24 ng/mL (ref >5.9)

## 2023-07-06 LAB — VITAMIN B12: Vitamin B-12: 882 pg/mL (ref 211–911)

## 2023-07-06 LAB — CORTISOL: Cortisol, Plasma: 0.9 ug/dL

## 2023-07-06 NOTE — Progress Notes (Unsigned)
Subjective:  Patient ID: Jessica Mack, female    DOB: [birth year]  Age: 67 y.o. MRN: [id number]  CC: Anemia and Back Pain   HPI Jessica Mack presents for f/up ----  She was recently seen in the ED for right lower back pain with radiculitis.  She has been diagnosed with a disc herniation and is being treated with steroids.  She was also found to be anemic.  Outpatient Medications Prior to Visit  Medication Sig Dispense Refill   baclofen (LIORESAL) 10 MG tablet Take 0.5-1 tablets (5-10 mg total) by mouth at bedtime as needed for muscle spasms. 90 each 4   clonazePAM (KLONOPIN) 1 MG tablet Take 1 tablet (1 mg total) by mouth at bedtime. 90 tablet 1   cyclobenzaprine (FLEXERIL) 10 MG tablet Take 1 tablet (10 mg total) by mouth 3 (three) times daily as needed for muscle spasms. 30 tablet 1   Estradiol 10 MCG TABS vaginal tablet 1 tablet vaginally twice weekly 24 tablet 3   famciclovir (FAMVIR) 500 MG tablet 3 tablets (1500mg ) at onset of fever blister symptoms 9 tablet 4   famotidine (PEPCID) 10 MG tablet Take 10 mg by mouth daily.     gabapentin (NEURONTIN) 300 MG capsule Take 1 capsule (300 mg total) by mouth 3 (three) times daily. 90 capsule 0   ketoconazole (NIZORAL) 2 % shampoo SMARTSIG:5 Milliliter(s) Topical 3 Times a Week     methylPREDNISolone (MEDROL DOSEPAK) 4 MG TBPK tablet Take per packet instructions. Taper dosing. 1 each 0   mometasone (ELOCON) 0.1 % ointment Apply topically twice weekly 45 g 1   Multiple Vitamins-Minerals (MULTIVITAMIN PO) Take 1 tablet by mouth daily. With calcium     nebivolol (BYSTOLIC) 5 MG tablet TAKE 1 TABLET BY MOUTH DAILY 90 tablet 1   olmesartan (BENICAR) 5 MG tablet Take 2 tablets (10 mg total) by mouth daily. 180 tablet 1   omega-3 acid ethyl esters (LOVAZA) 1 g capsule TAKE TWO CAPSULES BY MOUTH TWICE DAILY 360 capsule 1   Probiotic Product (ALIGN PO) Take 1 tablet by mouth daily.     tirzepatide (ZEPBOUND) 5 MG/0.5ML Pen Inject 5 mg into the skin  once a week.     Facility-Administered Medications Prior to Visit  Medication Dose Route Frequency Provider Last Rate Last Admin   methylPREDNISolone acetate (DEPO-MEDROL) injection 80 mg  80 mg Other Once         ROS Review of Systems  Constitutional:  Negative for chills, diaphoresis, fatigue and fever.  HENT: Negative.    Eyes: Negative.   Respiratory:  Negative for cough, chest tightness, shortness of breath and wheezing.   Cardiovascular:  Negative for chest pain, palpitations and leg swelling.  Gastrointestinal:  Negative for abdominal pain, blood in stool, constipation, diarrhea, nausea and vomiting.  Genitourinary: Negative.  Negative for difficulty urinating and dysuria.  Musculoskeletal:  Positive for back pain. Negative for joint swelling and myalgias.  Skin: Negative.  Negative for color change, pallor and rash.  Neurological:  Positive for weakness and numbness. Negative for dizziness.  Hematological:  Negative for adenopathy. Does not bruise/bleed easily.  Psychiatric/Behavioral: Negative.      Objective:  BP 122/84 (BP Location: Left Arm, Patient Position: Sitting, Cuff Size: Large)   Pulse 85   Temp 98.1 F (36.7 C) (Oral)   Resp 16   Ht 5\' 3"  (1.6 m)   Wt 150 lb (68 kg) Comment: pt unable  LMP 06/26/2004   SpO2 96%  BMI 26.57 kg/m   BP Readings from Last 3 Encounters:  07/06/23 122/84  07/05/23 129/87  06/29/23 124/88    Wt Readings from Last 3 Encounters:  07/06/23 150 lb (68 kg)  06/28/23 146 lb (66.2 kg)  05/04/23 153 lb 3.5 oz (69.5 kg)    Physical Exam Vitals reviewed.  Constitutional:      Appearance: Normal appearance.  HENT:     Nose: Nose normal.     Mouth/Throat:     Mouth: Mucous membranes are moist.  Eyes:     General: No scleral icterus.    Conjunctiva/sclera: Conjunctivae normal.  Cardiovascular:     Rate and Rhythm: Normal rate and regular rhythm.     Pulses: Normal pulses.     Heart sounds: No murmur heard.    No  friction rub. No gallop.  Pulmonary:     Effort: Pulmonary effort is normal.     Breath sounds: No stridor. No wheezing, rhonchi or rales.  Abdominal:     General: Abdomen is flat.     Palpations: There is no mass.     Tenderness: There is no abdominal tenderness. There is no guarding.     Hernia: No hernia is present.  Musculoskeletal:        General: Normal range of motion.     Cervical back: Neck supple.     Right lower leg: No edema.     Left lower leg: No edema.  Skin:    General: Skin is warm and dry.     Coloration: Skin is not pale.  Neurological:     General: No focal deficit present.     Mental Status: She is alert. Mental status is at baseline.  Psychiatric:        Mood and Affect: Mood normal.        Behavior: Behavior normal.     Lab Results  Component Value Date   WBC 7.2 07/06/2023   HGB 14.0 07/06/2023   HCT 42.8 07/06/2023   PLT 266.0 07/06/2023   GLUCOSE 116 (H) 07/06/2023   CHOL 224 (H) 04/02/2023   TRIG 150.0 (H) 04/02/2023   HDL 45.50 04/02/2023   LDLDIRECT 116.0 05/31/2020   LDLCALC 149 (H) 04/02/2023   ALT 45 (H) 04/02/2023   AST 36 04/02/2023   NA 139 07/06/2023   K 3.7 07/06/2023   CL 103 07/06/2023   CREATININE 0.66 07/06/2023   BUN 23 07/06/2023   CO2 26 07/06/2023   TSH 2.14 02/10/2022   INR 1.1 (H) 04/02/2023   HGBA1C 5.5 04/02/2023    XR Lumbar Spine Complete  Result Date: 07/02/2023 Complete view x-ray of the lumbar spine including AP, lateral, flexion/extension views were ordered and reviewed by myself today.  X-rays demonstrate multilevel spondylosis most notable at L4-L5 and L3-L4.  There is anterior spurring noted at these locations with some endplate sclerosis between L4 and L5.  There is no retro-/anterograde listhesis with flexion/extension views.  No acute compression fracture noted.  MR Lumbar Spine w/o contrast  Result Date: 07/01/2023 CLINICAL DATA:  Lumbar radiculopathy, immunocompromised R-radiculopathy with R-leg  weakness, difficulty walking EXAM: MRI LUMBAR SPINE WITHOUT CONTRAST TECHNIQUE: Multiplanar, multisequence MR imaging of the lumbar spine was performed. No intravenous contrast was administered. COMPARISON:  None Available. FINDINGS: Segmentation:  Standard. Alignment:  Straightening of the normal lumbar lordosis. Vertebrae: No fracture, evidence of discitis, or bone lesion. Degenerative endplate changes at the inferior and superior endplates of L4 on L5, as well as L5  on S1. Conus medullaris and cauda equina: Conus extends to the L1-L2 disc space level. Conus and cauda equina appear normal. Paraspinal and other soft tissues: There is nonspecific inward buckling of the anterior abdominal wall on the right (series 9, image 30). Disc levels: T12-L1: Unremarkable L1-L2: Mild bilateral facet degenerative change. No significant disc bulge. No spinal canal narrowing. No neural foraminal narrowing. L2-L3: No significant disc bulge. Mild bilateral facet degenerative change. No spinal canal narrowing. Mild right neural foraminal narrowing. L3-L4: Circumferential disc bulge. Mild bilateral facet degenerative change. Mild spinal canal narrowing. Mild right neural foraminal narrowing. L4-L5: Circumferential disc bulge with a right paracentral disc protrusion that obliterates the right lateral recess. Mild overall spinal canal narrowing. Moderate right and mild left neural foraminal narrowing. L5-S1: Eccentric left disc bulge with a right foraminal disc protrusion. Mild narrowing of the left lateral recess. Mild bilateral facet degenerative change. Severe right neural foraminal narrowing and mild left neural foraminal narrowing. IMPRESSION: 1. Right paracentral disc protrusion at L4-L5 severely narrows the right lateral recess. 2. Severe right neural foraminal narrowing at L5-S1 secondary to a right foraminal disc protrusion. 3. Chronic nonspecific inward buckling of the anterior abdominal wall on the right. Recommend  correlation with physical exam. Electronically Signed   By: Lorenza Cambridge M.D.   On: 07/01/2023 18:06    Assessment & Plan:   Deficiency anemia- H&H are normal now.  Will evaluate for vitamin deficiencies. -     IBC + Ferritin; Future -     CBC with Differential/Platelet; Future -     Vitamin B12; Future -     Folate; Future -     Zinc; Future -     Reticulocytes; Future  Idiopathic hypotension -     Basic metabolic panel; Future -     Cortisol; Future  Elevated ferritin level -     IBC + Ferritin  Flu vaccine need -     Flu vaccine trivalent PF, 6mos and older(Flulaval,Afluria,Fluarix,Fluzone)  Low serum cortisol level- Her blood pressure is normal.  This is consistent with suppression caused by the recent use of systemic steroids.     Follow-up: Return in about 6 months (around 01/03/2024).  Sanda Linger, MD

## 2023-07-06 NOTE — Patient Instructions (Signed)

## 2023-07-07 ENCOUNTER — Encounter: Payer: Self-pay | Admitting: Internal Medicine

## 2023-07-07 ENCOUNTER — Encounter: Payer: BC Managed Care – PPO | Admitting: Rehabilitative and Restorative Service Providers"

## 2023-07-07 ENCOUNTER — Other Ambulatory Visit: Payer: Self-pay | Admitting: Physical Medicine and Rehabilitation

## 2023-07-07 ENCOUNTER — Encounter: Payer: Self-pay | Admitting: Physical Medicine and Rehabilitation

## 2023-07-07 DIAGNOSIS — Z23 Encounter for immunization: Secondary | ICD-10-CM | POA: Insufficient documentation

## 2023-07-07 DIAGNOSIS — M5416 Radiculopathy, lumbar region: Secondary | ICD-10-CM

## 2023-07-07 DIAGNOSIS — R7989 Other specified abnormal findings of blood chemistry: Secondary | ICD-10-CM | POA: Insufficient documentation

## 2023-07-07 DIAGNOSIS — M5116 Intervertebral disc disorders with radiculopathy, lumbar region: Secondary | ICD-10-CM

## 2023-07-07 LAB — BASIC METABOLIC PANEL
BUN: 23 mg/dL (ref 6–23)
CO2: 26 meq/L (ref 19–32)
Calcium: 9.7 mg/dL (ref 8.4–10.5)
Chloride: 103 meq/L (ref 96–112)
Creatinine, Ser: 0.66 mg/dL (ref 0.40–1.20)
GFR: 92.34 mL/min (ref >60.00)
Glucose, Bld: 116 mg/dL — ABNORMAL HIGH (ref 70–99)
Potassium: 3.7 meq/L (ref 3.5–5.1)
Sodium: 139 meq/L (ref 135–145)

## 2023-07-07 LAB — IBC + FERRITIN
Ferritin: 428.8 ng/mL — ABNORMAL HIGH (ref 10.0–291.0)
Iron: 76 ug/dL (ref 42–145)
Saturation Ratios: 19.7 % — ABNORMAL LOW (ref 20.0–50.0)
TIBC: 386.4 ug/dL (ref 250.0–450.0)
Transferrin: 276 mg/dL (ref 212.0–360.0)

## 2023-07-07 MED ORDER — TRIAZOLAM 0.25 MG PO TABS
ORAL_TABLET | ORAL | 0 refills | Status: DC
Start: 2023-07-07 — End: 2023-08-19

## 2023-07-09 NOTE — Therapy (Signed)
OUTPATIENT OCCUPATIONAL THERAPY TREATMENT & DISCHARGE NOTE  Patient Name: Jessica Mack MRN: [id number] DOB:02/25/1956, 67 y.o., female Today's Date: 07/12/2023  PCP: Sanda Linger, MD REFERRING PROVIDER: [name], MD      OCCUPATIONAL THERAPY DISCHARGE SUMMARY  Visits from Start of Care: 8  Current functional level related to goals / functional outcomes: Pt has met all goals to satisfactory levels and is pleased with outcomes.   07/12/23: Today she has no more elbow pain at rest, and only very mild, inconsistent pain when flexing her tricep with elbow flexed from 90 degrees or more.  She has met all other goals for therapy and we also discussed that her other health issues seem more important and prominent now than any upper extremity complaints.  She was given upgraded therapy bands to work on at home and will continue to strengthen carefully avoiding exacerbation of back problems. Successful D/C today   Remaining deficits: Pt has no more significant functional deficits or pain.   Education / Equipment: Pt has all needed materials and education. Pt understands how to continue on with self-management. See tx notes for more details.   Patient agrees to discharge due to max benefits received from outpatient occupational therapy / hand therapy at this time.   Jessica Mack, OTR/L, CHT 07/12/23      END OF SESSION:  OT End of Session - 07/12/23 1306     Visit Number 8    Number of Visits 12    Date for OT Re-Evaluation 07/09/23    Authorization Type BCBS    OT Start Time 1306    OT Stop Time 1356    OT Time Calculation (min) 50 min    Equipment Utilized During Treatment green, blue t-bands    Activity Tolerance Patient tolerated treatment well;No increased pain    Behavior During Therapy [facility] for tasks assessed/performed              Past Medical History:  Diagnosis Date   Avascular necrosis of bone (HCC)    Left talus   Blood transfusion    Breast  cancer (HCC) 2005   left   Cancer (HCC)    Dysplastic nevus 1988   right hip   Fatty liver    GERD (gastroesophageal reflux disease)    History of breast cancer 2005   invasive ductal cancer left breast   Hypertension    Hypertriglyceridemia    Irritable bowel syndrome    Metabolic syndrome    resolved with diet and exercise   NASH (nonalcoholic steatohepatitis)    Nocturnal leg cramps 02/14/2015   Osteopenia    Palpitations 09/2012   normal echo   Peripheral neuropathy    Personal history of chemotherapy    Personal history of radiation therapy    Plantar fasciitis 03/2003   PONV (postoperative nausea and vomiting)    Tongue dysplasia    Ulcer    Past Surgical History:  Procedure Laterality Date   ANKLE SURGERY  07/2007   left, revascularization   BREAST LUMPECTOMY  06/2004   left, with port placement   CESAREAN SECTION  1991, 1993   x2   COLONOSCOPY  09/2013   Dr. Loreta Ave   DG GALL BLADDER     IRRIGATION AND DEBRIDEMENT ELBOW Left 05/04/2023   Procedure: LEFT ELBOW IRRIGATION AND DEBRIDEMENT;  Surgeon: [name], MD;  Location: [facility];  Service: Orthopedics;  Laterality: Left;   LAPAROSCOPIC CHOLECYSTECTOMY  2003   MOUTH SURGERY  11/15   implant/crown placed 4/16   SHOULDER ARTHROSCOPY  10/2005   right   SHOULDER SURGERY  12/15   left   TONGUE SURGERY  2012, 2013   WRIST SURGERY  1988   right, DeQuervains   Patient Active Problem List   Diagnosis Date Noted   Flu vaccine need 07/07/2023   Low serum cortisol level 07/07/2023   Idiopathic hypotension 07/06/2023   Deficiency anemia 07/06/2023   Triceps tendon rupture, left, initial encounter 05/04/2023   Olecranon bursitis of left elbow 05/04/2023   Elevated ferritin level 04/05/2023   Mild persistent asthma without complication 09/28/2022   NASH (nonalcoholic steatohepatitis) 06/04/2020   Hypertriglyceridemia    Routine general medical examination at a health care facility 02/20/2020    Essential hypertension 02/20/2020   Osteopenia 08/05/2014   Breast cancer of upper-outer quadrant of left female breast (HCC) 07/20/2013   Leukoplakia of oral mucosa, including tongue 07/26/2012   Essential and other specified forms of tremor 07/26/2012   Drug-induced polyneuropathy (HCC) 07/26/2012    ONSET DATE: 05/04/23 DOS elbow bursectomy and triceps repair   REFERRING DIAG: M70.22 (ICD-10-CM) - Olecranon bursitis of left elbow   THERAPY DIAG:  Pain in left elbow  Stiffness of left elbow, not elsewhere classified  Stiffness of left shoulder, not elsewhere classified  Muscle weakness (generalized)  Paresthesia of skin  Other lack of coordination  Localized edema  Rationale for Evaluation and Treatment: Rehabilitation  PERTINENT HISTORY: Per MD note 06/11/23: Assessment "She will continue to progress weightbearing and I have advised she can lift up to 10 pounds"  Per referral: "ROM of L elbow and desensitivity therapy "  Per MD orders, she needs "aggressive" scar mobilizations.  She has a history of an old wrist surgery, breast cancer treatments (radiation and chemo) that have left her with some peripheral neuropathy in feet and fingers- treatments were about 13 years ago.  She states retiring due to this paresthesia and pain in her hands and decreased fine motor skills  PRECAUTIONS: None  RED FLAGS: None   WEIGHT BEARING RESTRICTIONS: Yes ROM as tolerated with 10# restriction per MD notes, 06/11/23     SUBJECTIVE:   SUBJECTIVE STATEMENT: 07/12/23: She states no significant pain in her elbow now and that she feels that her back is the most important medical issue she has now.  She would be fine with discharging OT for her elbow today.    PAIN:  Are you having pain? NOT in Lt elbow or arm now at rest  - only back pain    PATIENT GOALS: To decrease pain and increase ability in motion and strength in the left elbow for daily activities  NEXT MD VISIT:  06/11/23   OBJECTIVE: (All objective assessments below are from initial evaluation on: 05/27/23 unless otherwise specified.)   HAND DOMINANCE: Right   ADLs: Overall ADLs: 07/12/23 States no significant issues now related to elbow or arm   FUNCTIONAL OUTCOME MEASURES: 06/22/23: 10% QUICK DASH  Eval: Quck DASH 47% impairment today  (Higher % Score  =  More Impairment)     UPPER EXTREMITY ROM     Shoulder to Wrist AROM Left eval Lt 07/12/23  Shoulder flexion 140 (160 Rt)  150 bil  Shoulder abduction    Shoulder extension    Shoulder internal rotation    Shoulder external rotation    Elbow flexion 117 150 WNL  Elbow extension (-7) 0  Forearm supination 79 80  Forearm pronation  80 80  Wrist flexion WNL   Wrist extension WNL   (Blank rows = not tested)   Hand AROM Left eval  Full Fist Ability (or Gap to Distal Palmar Crease) full  Thumb Opposition  (Kapandji Scale)  WNL  (Blank rows = not tested)   UPPER EXTREMITY MMT:    Eval: Based on observations and limited range of motion, her strength would be rated 3-/5 MMT for elbow flexion and extension today, no resistance was applied due to 5 pound weight restriction.  Will be tested in detail once she is cleared by the doctor in about 2 weeks.  MMT Left 06/22/23 Left  07/01/23 Lt 07/12/23  Shoulder flexion     Shoulder abduction     Shoulder adduction     Shoulder extension     Shoulder internal rotation     Shoulder external rotation     Middle trapezius     Lower trapezius     Elbow flexion +3/5 4+/5 5/5  Elbow extension +3/5 w/ c/o pain 4/5 w/ c/o 1/10 pain improving 4/5 with triceps pain  Forearm supination   5/5  Forearm pronation   5/5  Wrist flexion     Wrist extension     Wrist ulnar deviation     Wrist radial deviation     (Blank rows = not tested)  HAND FUNCTION: 07/01/23: Grip Lt = 68#   Eval: Observed weakness in affected hand.  Grip strength Right: 66 lbs, Left: 37 lbs    COORDINATION: 07/01/23: 9  Hole Peg test: Rt = 23.16 sec vs Lt = 22.41 sec, WNL's  06/22/23: Box and Blocks Test: Box and Blocks Lt = 62, WFL's.  Eval: Observed coordination impairments with affected Lt arm, seen by inability to fully move the elbow and shoulder.  Details will be tested TBD.  EDEMA:   Eval:  none now 07/12/23    TODAY'S TREATMENT:  07/12/23: Pt performs AROM, gripping, and strength with left hand and arm against therapist's resistance for exercise/activities as well as new measures today.  Resistance with tricep extension is mildly painful to through her tricep, though she has no pain on palpation or palpable muscle spasms.  Also interestingly it seems to be inconsistent and not present if her elbow is extended more than 90 degrees.  OT feels like this just represents weakness of endurance and that she should continue on with exercises and was given new tricep and bicep exercises with green and blue therapy bands to perform.  She was also educated on new tricep stretches today to help with this pain although she states no stretches felt through her tricep.  She also states that radial nerve glides have not been necessary as she has no nerve pain through her arm now.  OT also discusses home and functional tasks with the pt and reviews goals. Using the complied data, OT also reviews her other home exercises and provides updated recommendations and upgrades as below.  OT also discusses her new onset of back pain and tries to ensure that nothing she is doing well irritate her back this includes modifying her current exercise program and giving home and functional recommendations.  Pt states understanding and tolerates upgrades well. She agrees to discharge today     Exercises - "Raise the Roof"   - 4-6 x daily - 1-2 sets - 5-10 reps - Bend and Straighten Elbow  - 3-4 x daily - 1-2 sets - 10-15 reps - Palm Up / Palm Down  -  3-4 x daily - 1-2 sets - 10-15 reps - Bend and Pull Back Wrist SLOWLY  - 3-4 x daily - 1-2  sets - 10 reps - Putty Squeezes  - 1 x daily - 7 x weekly - 1 sets - 5-10 reps - Key Pinch with Putty  - 1 x daily - 7 x weekly - 5 sets - 5 reps - 3-Point Pinch with Putty  - 1 x daily - 7 x weekly - 1 sets - 5 reps - Overhead Triceps Stretch  - 4-6 x daily - 1 sets - 10-15 reps - Standing Elbow Extension with Self-Anchored Resistance  - 4-6 x daily - 1 sets - 10-15 reps - Standing Bicep Curls with Resistance  - 2-4 x daily - 1-2 sets - 10-15 reps - Seated Chair Push Ups  - 4-6 x daily - 1 sets - 10-15 reps Patient Education - Scar Massage   PATIENT EDUCATION: Education details: See tx section above for details  Person educated: Patient Education method: Verbal Instruction, Teach back, Handouts  Education comprehension: States and demonstrates understanding, Additional Education required    HOME EXERCISE PROGRAM: Access Code: [email or web address] URL: [email or web address] Date: 07/12/2023 Prepared by: Jessica Mack   GOALS: Goals reviewed with patient? Yes   SHORT TERM GOALS: (STG required if POC>30 days) Target Date: 06/11/23  Pt will obtain protective, custom orthotic. Goal status: N/A/Met  2.  Pt will demo/state understanding of initial HEP to improve pain levels and prerequisite motion. Goal status: Met 06/10/23   LONG TERM GOALS: Target Date: 07/09/23  Pt will improve functional ability by decreased impairment per Quick DASH assessment from 47% to 15% or better, for better quality of life. Goal status: 06/22/23: 10% Met  2.  Pt will improve grip strength in Lt hand from 37lbs to at least 55lbs for functional use at home and in IADLs. Goal status: Met   07/01/23: Lt 68#  3.  Pt will improve A/ROM in Lt elbow flex/ext from 117 / (-7)  to at least 140 / (-5), to have functional motion for tasks like reach and grasp.  Goal status: 06/22/23: 146/ (-4), Met  4.  Pt will improve strength in Lt elbow flex and ext from painful 3-/5 MMT to at least 4+/5 MMT to have  increased functional ability to carry out selfcare and higher-level homecare tasks with no difficulty. Goal status: 07/12/23: Partially met. 4+/5 or better in all planes except tricep extension which was 4/5 and mildly tender- she has a HEP plan for this   5.  Pt will improve coordination skills in Lt arm, as seen by [facility] score on Box and Blocks testing to have increased functional ability to carry out fine motor tasks (fasteners, etc.) and more complex, coordinated IADLs (meal prep, sports, etc.).  Goal status:  07/01/23: 9 Hole Peg Test: Rt = 23.16 sec vs Lt = 22.41 sec WFL's/Met  6.  Pt will decrease pain at worst from 8/10 to 3/10 or better to have better sleep and occupational participation in daily roles. Goal status: 07/12/23: MET- 1-2/10 pain at wort now in elbow.    ASSESSMENT:  CLINICAL IMPRESSION: 07/12/23: Today she has no more elbow pain at rest, and only very mild, inconsistent pain when flexing her tricep with warmth flexed from 90 degrees or more.  Flexing tricep with elbow angle greater than 90 degrees is not painful.  She has met all other goals for therapy and we also discussed that her other health issues seem  more important and prominent now than any upper extremity complaints.  She was given upgraded therapy bands to work on at home and will continue to strengthen carefully avoiding exacerbation of back problems. Successful D/C today      PLAN:  OT FREQUENCY: & OT DURATION: 1 final week and 1 final visit from 07/09/23 - 07/12/23 to cover today's last session    PLANNED INTERVENTIONS: self care/ADL training, therapeutic exercise, therapeutic activity, neuromuscular re-education, manual therapy, scar mobilization, passive range of motion, splinting, compression bandaging, moist heat, cryotherapy, contrast bath, patient/family education, energy conservation, coping strategies training, Re-evaluation, and Dry needling  CONSULTED AND AGREED WITH PLAN OF CARE: Patient  PLAN FOR  NEXT SESSION:  Successful discharge  Jessica Mack, OTR/L  07/12/2023, 3:04 PM

## 2023-07-12 ENCOUNTER — Ambulatory Visit: Payer: PPO | Admitting: Rehabilitative and Restorative Service Providers"

## 2023-07-12 ENCOUNTER — Encounter: Payer: Self-pay | Admitting: Rehabilitative and Restorative Service Providers"

## 2023-07-12 DIAGNOSIS — M25612 Stiffness of left shoulder, not elsewhere classified: Secondary | ICD-10-CM | POA: Diagnosis not present

## 2023-07-12 DIAGNOSIS — M6281 Muscle weakness (generalized): Secondary | ICD-10-CM

## 2023-07-12 DIAGNOSIS — R202 Paresthesia of skin: Secondary | ICD-10-CM

## 2023-07-12 DIAGNOSIS — M25522 Pain in left elbow: Secondary | ICD-10-CM | POA: Diagnosis not present

## 2023-07-12 DIAGNOSIS — M25622 Stiffness of left elbow, not elsewhere classified: Secondary | ICD-10-CM | POA: Diagnosis not present

## 2023-07-12 DIAGNOSIS — R278 Other lack of coordination: Secondary | ICD-10-CM

## 2023-07-12 DIAGNOSIS — R600 Localized edema: Secondary | ICD-10-CM

## 2023-07-14 ENCOUNTER — Encounter: Payer: Self-pay | Admitting: Physical Medicine and Rehabilitation

## 2023-07-15 ENCOUNTER — Other Ambulatory Visit: Payer: Self-pay

## 2023-07-15 ENCOUNTER — Ambulatory Visit: Payer: PPO | Admitting: Physical Medicine and Rehabilitation

## 2023-07-15 DIAGNOSIS — M5416 Radiculopathy, lumbar region: Secondary | ICD-10-CM | POA: Diagnosis not present

## 2023-07-15 LAB — RETICULOCYTES
ABS Retic: 66300 cells/uL (ref 20000–80000)
Retic Ct Pct: 1.5 %

## 2023-07-15 LAB — ZINC: Zinc: 69 ug/dL (ref 60–130)

## 2023-07-15 MED ORDER — METHYLPREDNISOLONE ACETATE 80 MG/ML IJ SUSP
80.0000 mg | Freq: Once | INTRAMUSCULAR | Status: AC
  Administered 2023-07-15: 80 mg

## 2023-07-15 NOTE — Progress Notes (Signed)
Functional Pain Scale - descriptive words and definitions  Unmanageable (7)  Pain interferes with normal ADL's/nothing seems to help/sleep is very difficult/active distractions are very difficult to concentrate on. Severe range order  Average Pain 7   +Driver, -BT, -Dye Allergies.   Low buttock pain that goes down lateral leg to ankle. Last injection helped s little. Took away @ least 40 % of the pain.

## 2023-07-15 NOTE — Patient Instructions (Signed)

## 2023-07-19 ENCOUNTER — Encounter (HOSPITAL_BASED_OUTPATIENT_CLINIC_OR_DEPARTMENT_OTHER): Payer: Self-pay | Admitting: Obstetrics & Gynecology

## 2023-07-19 ENCOUNTER — Ambulatory Visit (HOSPITAL_BASED_OUTPATIENT_CLINIC_OR_DEPARTMENT_OTHER): Payer: PPO | Admitting: Obstetrics & Gynecology

## 2023-07-19 ENCOUNTER — Encounter: Payer: PPO | Admitting: Physical Medicine and Rehabilitation

## 2023-07-19 ENCOUNTER — Other Ambulatory Visit (HOSPITAL_COMMUNITY)
Admission: RE | Admit: 2023-07-19 | Discharge: 2023-07-19 | Disposition: A | Discharge status: Home / Self Care | Payer: PPO | Source: Ambulatory Visit | Attending: Obstetrics & Gynecology | Admitting: Obstetrics & Gynecology

## 2023-07-19 VITALS — BP 126/82 | HR 75 | Ht 62.32 in | Wt 145.0 lb

## 2023-07-19 DIAGNOSIS — Z780 Asymptomatic menopausal state: Secondary | ICD-10-CM

## 2023-07-19 DIAGNOSIS — N952 Postmenopausal atrophic vaginitis: Secondary | ICD-10-CM

## 2023-07-19 DIAGNOSIS — K069 Disorder of gingiva and edentulous alveolar ridge, unspecified: Secondary | ICD-10-CM

## 2023-07-19 DIAGNOSIS — N9089 Other specified noninflammatory disorders of vulva and perineum: Secondary | ICD-10-CM | POA: Diagnosis not present

## 2023-07-19 DIAGNOSIS — Z124 Encounter for screening for malignant neoplasm of cervix: Secondary | ICD-10-CM

## 2023-07-19 DIAGNOSIS — Z9189 Other specified personal risk factors, not elsewhere classified: Secondary | ICD-10-CM | POA: Diagnosis not present

## 2023-07-19 DIAGNOSIS — B009 Herpesviral infection, unspecified: Secondary | ICD-10-CM

## 2023-07-19 DIAGNOSIS — M8588 Other specified disorders of bone density and structure, other site: Secondary | ICD-10-CM

## 2023-07-19 MED ORDER — MOMETASONE FUROATE 0.1 % EX OINT: TOPICAL_OINTMENT | CUTANEOUS | 3 refills | Status: DC

## 2023-07-19 MED ORDER — ESTRADIOL 10 MCG VA TABS: ORAL_TABLET | VAGINAL | 3 refills | Status: AC

## 2023-07-19 NOTE — Progress Notes (Signed)
Jessica Mack - 67 y.o. female MRN [id number]  Date of birth: May 22, 1956  Office Visit Note: Visit Date: 07/15/2023 PCP: Etta Grandchild, MD Referred by: Etta Grandchild, MD  Subjective: Chief Complaint  Patient presents with   Lower Back - Pain   HPI:  Jessica Mack is a 67 y.o. female who comes in today for planned repeat Right L5-S1  Lumbar Interlaminar epidural steroid injection with fluoroscopic guidance.  The patient has failed conservative care including home exercise, medications, time and activity modification.  This injection will be diagnostic and hopefully therapeutic.  Please see requesting physician notes for further details and justification. Patient received more than 40% pain relief from prior injection.  She has significant amounts of medication and she is more mobile and she is sleeping better.  In the room today she can sit and position much better  Referring: Dr. Madelyn Brunner   ROS Otherwise per HPI.  Assessment & Plan: Visit Diagnoses:    ICD-10-CM   1. Lumbar radiculopathy  M54.16 XR C-ARM NO REPORT    Epidural Steroid injection    methylPREDNISolone acetate (DEPO-MEDROL) injection 80 mg      Plan: No additional findings.   Meds & Orders:  Meds ordered this encounter  Medications   methylPREDNISolone acetate (DEPO-MEDROL) injection 80 mg    Orders Placed This Encounter  Procedures   XR C-ARM NO REPORT   Epidural Steroid injection    Follow-up: Return for visit to requesting provider as needed.   Procedures: No procedures performed  Lumbar Epidural Steroid Injection - Interlaminar Approach with Fluoroscopic Guidance  Patient: Jessica Mack      Date of Birth: 03-17-1956 MRN: [id number] PCP: Etta Grandchild, MD      Visit Date: 07/15/2023   Universal Protocol:     Consent Given By: the patient  Position: PRONE  Additional Comments: Vital signs were monitored before and after the procedure. Patient was prepped and draped in the usual sterile  fashion. The correct patient, procedure, and site was verified.   Injection Procedure Details:   Procedure diagnoses: Lumbar radiculopathy [M54.16]   Meds Administered:  Meds ordered this encounter  Medications   methylPREDNISolone acetate (DEPO-MEDROL) injection 80 mg     Laterality: Right  Location/Site:  L5-S1  Needle: 3.5 in., 20 ga. Tuohy  Needle Placement: Paramedian epidural  Findings:   -Comments: Excellent flow of contrast into the epidural space.  Procedure Details: Using a paramedian approach from the side mentioned above, the region overlying the inferior lamina was localized under fluoroscopic visualization and the soft tissues overlying this structure were infiltrated with 4 ml. of 1% Lidocaine without Epinephrine. The Tuohy needle was inserted into the epidural space using a paramedian approach.   The epidural space was localized using loss of resistance along with counter oblique bi-planar fluoroscopic views.  After negative aspirate for air, blood, and CSF, a 2 ml. volume of Isovue-250 was injected into the epidural space and the flow of contrast was observed. Radiographs were obtained for documentation purposes.    The injectate was administered into the level noted above.   Additional Comments:  No complications occurred Dressing: 2 x 2 sterile gauze and Band-Aid    Post-procedure details: Patient was observed during the procedure. Post-procedure instructions were reviewed.  Patient left the clinic in stable condition.   Clinical History: MRI LUMBAR SPINE WITHOUT CONTRAST   TECHNIQUE: Multiplanar, multisequence MR imaging of the lumbar spine was performed. No intravenous contrast was  administered.   COMPARISON:  None Available.   FINDINGS: Segmentation:  Standard.   Alignment:  Straightening of the normal lumbar lordosis.   Vertebrae: No fracture, evidence of discitis, or bone lesion. Degenerative endplate changes at the inferior and  superior endplates of L4 on L5, as well as L5 on S1.   Conus medullaris and cauda equina: Conus extends to the L1-L2 disc space level. Conus and cauda equina appear normal.   Paraspinal and other soft tissues: There is nonspecific inward buckling of the anterior abdominal wall on the right (series 9, image 30).   Disc levels:   T12-L1: Unremarkable   L1-L2: Mild bilateral facet degenerative change. No significant disc bulge. No spinal canal narrowing. No neural foraminal narrowing.   L2-L3: No significant disc bulge. Mild bilateral facet degenerative change. No spinal canal narrowing. Mild right neural foraminal narrowing.   L3-L4: Circumferential disc bulge. Mild bilateral facet degenerative change. Mild spinal canal narrowing. Mild right neural foraminal narrowing.   L4-L5: Circumferential disc bulge with a right paracentral disc protrusion that obliterates the right lateral recess. Mild overall spinal canal narrowing. Moderate right and mild left neural foraminal narrowing.   L5-S1: Eccentric left disc bulge with a right foraminal disc protrusion. Mild narrowing of the left lateral recess. Mild bilateral facet degenerative change. Severe right neural foraminal narrowing and mild left neural foraminal narrowing.   IMPRESSION: 1. Right paracentral disc protrusion at L4-L5 severely narrows the right lateral recess. 2. Severe right neural foraminal narrowing at L5-S1 secondary to a right foraminal disc protrusion. 3. Chronic nonspecific inward buckling of the anterior abdominal wall on the right. Recommend correlation with physical exam.     Electronically Signed   By: Lorenza Cambridge M.D.   On: 07/01/2023 18:06     Objective:  VS:  HT:    WT:   BMI:     BP:   HR: bpm  TEMP: ( )  RESP:  Physical Exam Vitals and nursing note reviewed.  Constitutional:      General: She is not in acute distress.    Appearance: Normal appearance. She is not ill-appearing.   HENT:     Head: Normocephalic and atraumatic.     Right Ear: External ear normal.     Left Ear: External ear normal.  Eyes:     Extraocular Movements: Extraocular movements intact.  Cardiovascular:     Rate and Rhythm: Normal rate.     Pulses: Normal pulses.  Pulmonary:     Effort: Pulmonary effort is normal. No respiratory distress.  Abdominal:     General: There is no distension.     Palpations: Abdomen is soft.  Musculoskeletal:        General: Tenderness present.     Cervical back: Neck supple.     Right lower leg: No edema.     Left lower leg: No edema.     Comments: Patient has good distal strength with no pain over the greater trochanters.  No clonus or focal weakness.  Skin:    Findings: No erythema, lesion or rash.  Neurological:     General: No focal deficit present.     Mental Status: She is alert and oriented to person, place, and time.     Sensory: No sensory deficit.     Motor: No weakness or abnormal muscle tone.     Coordination: Coordination normal.  Psychiatric:        Mood and Affect: Mood normal.  Behavior: Behavior normal.      Imaging: No results found.

## 2023-07-19 NOTE — Procedures (Signed)
Lumbar Epidural Steroid Injection - Interlaminar Approach with Fluoroscopic Guidance  Patient: Jessica Mack      Date of Birth: 28-Jul-1956 MRN: [id number] PCP: Etta Grandchild, MD      Visit Date: 07/15/2023   Universal Protocol:     Consent Given By: the patient  Position: PRONE  Additional Comments: Vital signs were monitored before and after the procedure. Patient was prepped and draped in the usual sterile fashion. The correct patient, procedure, and site was verified.   Injection Procedure Details:   Procedure diagnoses: Lumbar radiculopathy [M54.16]   Meds Administered:  Meds ordered this encounter  Medications   methylPREDNISolone acetate (DEPO-MEDROL) injection 80 mg     Laterality: Right  Location/Site:  L5-S1  Needle: 3.5 in., 20 ga. Tuohy  Needle Placement: Paramedian epidural  Findings:   -Comments: Excellent flow of contrast into the epidural space.  Procedure Details: Using a paramedian approach from the side mentioned above, the region overlying the inferior lamina was localized under fluoroscopic visualization and the soft tissues overlying this structure were infiltrated with 4 ml. of 1% Lidocaine without Epinephrine. The Tuohy needle was inserted into the epidural space using a paramedian approach.   The epidural space was localized using loss of resistance along with counter oblique bi-planar fluoroscopic views.  After negative aspirate for air, blood, and CSF, a 2 ml. volume of Isovue-250 was injected into the epidural space and the flow of contrast was observed. Radiographs were obtained for documentation purposes.    The injectate was administered into the level noted above.   Additional Comments:  No complications occurred Dressing: 2 x 2 sterile gauze and Band-Aid    Post-procedure details: Patient was observed during the procedure. Post-procedure instructions were reviewed.  Patient left the clinic in stable condition.

## 2023-07-19 NOTE — Progress Notes (Unsigned)
67 y.o. G2P2 Married White or Caucasian female here for breast and pelvic exam.  I am also following her for history of breast cancer.  Denies vaginal bleeding.  Having back issues with bulging discs.  Had two injections and using a walker for support.  This has given her some improvement.  She did drive today.  She is trying not to have surgery.  Physical therapy is scheduled for 10/3.  She is going to see Dr. Christell Constant for consult.    Patient's last menstrual period was 06/26/2004.          Sexually active: {yes no:314532}  H/O STD:  {YES NO:22349}  Health Maintenance: PCP:  Dr. Sanda Linger.  Last wellness appt was 06/2023.  Did blood work at that appt:   Vaccines are up to date:  {YES NO:22349} Colonoscopy:  10/13/2018, follow up 4 years MMG:  09/2022 BMD:  09/2020, spine -2.4 Last pap smear:  03/26/2021 H/o abnormal pap smear:  h/o ASCUS neg HR HPV 2013    reports that she has never smoked. She has never used smokeless tobacco. She reports current alcohol use of about 7.0 standard drinks of alcohol per week. She reports that she does not use drugs.  Past Medical History:  Diagnosis Date   Avascular necrosis of bone (HCC)    Left talus   Blood transfusion    Breast cancer (HCC) 2005   left   Cancer (HCC)    Dysplastic nevus 1988   right hip   Fatty liver    GERD (gastroesophageal reflux disease)    History of breast cancer 2005   invasive ductal cancer left breast   Hypertension    Hypertriglyceridemia    Irritable bowel syndrome    Metabolic syndrome    resolved with diet and exercise   NASH (nonalcoholic steatohepatitis)    Nocturnal leg cramps 02/14/2015   Osteopenia    Palpitations 09/2012   normal echo   Peripheral neuropathy    Personal history of chemotherapy    Personal history of radiation therapy    Plantar fasciitis 03/2003   PONV (postoperative nausea and vomiting)    Tongue dysplasia    Ulcer     Past Surgical History:  Procedure Laterality Date    ANKLE SURGERY  07/2007   left, revascularization   BREAST LUMPECTOMY  06/2004   left, with port placement   CESAREAN SECTION  1991, 1993   x2   COLONOSCOPY  09/2013   Dr. Loreta Ave   DG GALL BLADDER     IRRIGATION AND DEBRIDEMENT ELBOW Left 05/04/2023   Procedure: LEFT ELBOW IRRIGATION AND DEBRIDEMENT;  Surgeon: [name], MD;  Location: [facility];  Service: Orthopedics;  Laterality: Left;   LAPAROSCOPIC CHOLECYSTECTOMY  2003   MOUTH SURGERY  11/15   implant/crown placed 4/16   SHOULDER ARTHROSCOPY  10/2005   right   SHOULDER SURGERY  12/15   left   TONGUE SURGERY  2012, 2013   WRIST SURGERY  1988   right, DeQuervains    Current Outpatient Medications  Medication Sig Dispense Refill   baclofen (LIORESAL) 10 MG tablet Take 0.5-1 tablets (5-10 mg total) by mouth at bedtime as needed for muscle spasms. 90 each 4   clonazePAM (KLONOPIN) 1 MG tablet Take 1 tablet (1 mg total) by mouth at bedtime. 90 tablet 1   cyclobenzaprine (FLEXERIL) 10 MG tablet Take 1 tablet (10 mg total) by mouth 3 (three) times daily as needed for muscle spasms. 30  tablet 1   Estradiol 10 MCG TABS vaginal tablet 1 tablet vaginally twice weekly 24 tablet 3   famciclovir (FAMVIR) 500 MG tablet 3 tablets (1500mg ) at onset of fever blister symptoms 9 tablet 4   famotidine (PEPCID) 10 MG tablet Take 10 mg by mouth daily.     gabapentin (NEURONTIN) 300 MG capsule Take 1 capsule (300 mg total) by mouth 3 (three) times daily. 90 capsule 0   ketoconazole (NIZORAL) 2 % shampoo SMARTSIG:5 Milliliter(s) Topical 3 Times a Week     methylPREDNISolone (MEDROL DOSEPAK) 4 MG TBPK tablet Take per packet instructions. Taper dosing. 1 each 0   mometasone (ELOCON) 0.1 % ointment Apply topically twice weekly 45 g 1   Multiple Vitamins-Minerals (MULTIVITAMIN PO) Take 1 tablet by mouth daily. With calcium     nebivolol (BYSTOLIC) 5 MG tablet TAKE 1 TABLET BY MOUTH DAILY 90 tablet 1   olmesartan (BENICAR) 5 MG tablet Take  2 tablets (10 mg total) by mouth daily. 180 tablet 1   omega-3 acid ethyl esters (LOVAZA) 1 g capsule TAKE TWO CAPSULES BY MOUTH TWICE DAILY 360 capsule 1   Probiotic Product (ALIGN PO) Take 1 tablet by mouth daily.     tirzepatide (ZEPBOUND) 5 MG/0.5ML Pen Inject 5 mg into the skin once a week.     triazolam (HALCION) 0.25 MG tablet Take one tablet (0.25 mg) by mouth with food one hour prior to procedure. May repeat 30 minutes prior if needed. 2 tablet 0   Current Facility-Administered Medications  Medication Dose Route Frequency Provider Last Rate Last Admin   methylPREDNISolone acetate (DEPO-MEDROL) injection 80 mg  80 mg Other Once         Family History  Problem Relation Age of Onset   Thyroid disease Mother    Hyperlipidemia Mother    Hypertension Mother    Diabetes Mother    Cancer Mother        melanoma   Heart disease Mother    Depression Mother    Sleep apnea Mother    Obesity Mother    Heart disease Father    Hyperlipidemia Father    Hypertension Father    Cancer Father        colon   Prostate cancer Father    Kidney disease Father    Asthma Sister    Asthma Brother    Breast cancer Neg Hx     Review of Systems  Constitutional: Negative.   Genitourinary: Negative.     Exam:   BP 126/82 (BP Location: Left Arm, Patient Position: Sitting, Cuff Size: Normal)   Pulse 75   Ht 5' 2.32" (1.583 m)   Wt 145 lb (65.8 kg)   LMP 06/26/2004   BMI 26.25 kg/m   Height: 5' 2.32" (158.3 cm)  General appearance: alert, cooperative and appears stated age Breasts:  Abdomen: soft, non-tender; bowel sounds normal; no masses,  no organomegaly Lymph nodes: Cervical, supraclavicular, and axillary nodes normal.  No abnormal inguinal nodes palpated Neurologic: Grossly normal  Pelvic: External genitalia:  no lesions              Urethra:  normal appearing urethra with no masses, tenderness or lesions              Bartholins and Skenes: normal                 Vagina: normal  appearing vagina with atrophic changes ***and no discharge, no lesions  Cervix: {exam; cervix:14595}              Pap taken: {yes no:314532} Bimanual Exam:  Uterus:  {exam; uterus:12215}              Adnexa: {exam; adnexa:12223}               Rectovaginal: Confirms               Anus:  normal sphincter tone, no lesions  Chaperone, ***, CMA, was present for exam.  Assessment/Plan: There are no diagnoses linked to this encounter.

## 2023-07-20 ENCOUNTER — Telehealth: Payer: Self-pay

## 2023-07-20 ENCOUNTER — Other Ambulatory Visit: Payer: Self-pay | Admitting: Sports Medicine

## 2023-07-20 NOTE — Telephone Encounter (Signed)
Transition Care Management Unsuccessful Follow-up Telephone Call  Date of discharge and from where:  [facility] 9/3  Attempts:  1st Attempt  Reason for unsuccessful TCM follow-up call:  No answer/busy   Lenard Forth [place]  [facility], [place], Phone: [phone] Website: [email or web address]

## 2023-07-21 LAB — CYTOLOGY - PAP: Diagnosis: NEGATIVE

## 2023-07-21 MED ORDER — GABAPENTIN 300 MG PO CAPS: 300.0000 mg | ORAL_CAPSULE | Freq: Three times a day (TID) | ORAL | 0 refills | Status: DC

## 2023-07-22 ENCOUNTER — Telehealth: Payer: Self-pay

## 2023-07-22 NOTE — Telephone Encounter (Signed)
Transition Care Management Follow-up Telephone Call Date of discharge and from where: [facility] 9/3 How have you been since you were released from the hospital? Patient still isn't walking on her own yet and was misdiagnosed in the ED. Pt has been following up with PCP and other providers  Any questions or concerns? No  Items Reviewed: Did the pt receive and understand the discharge instructions provided? Yes  Medications obtained and verified? No  Other? No  Any new allergies since your discharge? No  Dietary orders reviewed? No Do you have support at home? Yes     Follow up appointments reviewed:  PCP Hospital f/u appt confirmed? Yes  Scheduled to see  on  @ . Specialist Hospital f/u appt confirmed? Yes  Scheduled to see  on  @ . Are transportation arrangements needed? No  If their condition worsens, is the pt aware to call PCP or go to the Emergency Dept.? Yes Was the patient provided with contact information for the PCP's office or ED? Yes Was to pt encouraged to call back with questions or concerns? Yes

## 2023-07-23 ENCOUNTER — Other Ambulatory Visit: Payer: Self-pay | Admitting: Sports Medicine

## 2023-07-23 ENCOUNTER — Encounter: Payer: Self-pay | Admitting: Sports Medicine

## 2023-07-23 DIAGNOSIS — M5116 Intervertebral disc disorders with radiculopathy, lumbar region: Secondary | ICD-10-CM

## 2023-07-23 MED ORDER — GABAPENTIN 600 MG PO TABS
600.0000 mg | ORAL_TABLET | Freq: Three times a day (TID) | ORAL | 0 refills | Status: DC
Start: 2023-07-23 — End: 2023-08-09

## 2023-07-26 ENCOUNTER — Encounter: Payer: Self-pay | Admitting: Sports Medicine

## 2023-07-26 ENCOUNTER — Encounter: Payer: Self-pay | Admitting: Physical Medicine and Rehabilitation

## 2023-07-27 ENCOUNTER — Ambulatory Visit (HOSPITAL_BASED_OUTPATIENT_CLINIC_OR_DEPARTMENT_OTHER): Payer: PPO | Admitting: Physical Therapy

## 2023-07-28 ENCOUNTER — Other Ambulatory Visit (HOSPITAL_COMMUNITY): Payer: Self-pay

## 2023-07-28 ENCOUNTER — Other Ambulatory Visit: Payer: Self-pay | Admitting: Obstetrics & Gynecology

## 2023-07-28 DIAGNOSIS — Z0000 Encounter for general adult medical examination without abnormal findings: Secondary | ICD-10-CM

## 2023-07-28 MED ORDER — ZEPBOUND 15 MG/0.5ML ~~LOC~~ SOAJ
15.0000 mg | SUBCUTANEOUS | 6 refills | Status: DC
  Filled 2023-07-28 – 2023-07-29 (×2): qty 2, 28d supply, fill #0

## 2023-07-29 ENCOUNTER — Ambulatory Visit: Payer: PPO | Admitting: Rehabilitative and Restorative Service Providers"

## 2023-07-29 ENCOUNTER — Other Ambulatory Visit (HOSPITAL_COMMUNITY): Payer: Self-pay

## 2023-07-29 ENCOUNTER — Encounter: Payer: Self-pay | Admitting: Rehabilitative and Restorative Service Providers"

## 2023-07-29 DIAGNOSIS — R293 Abnormal posture: Secondary | ICD-10-CM

## 2023-07-29 DIAGNOSIS — M6281 Muscle weakness (generalized): Secondary | ICD-10-CM

## 2023-07-29 DIAGNOSIS — M5441 Lumbago with sciatica, right side: Secondary | ICD-10-CM

## 2023-07-29 DIAGNOSIS — M5417 Radiculopathy, lumbosacral region: Secondary | ICD-10-CM | POA: Diagnosis not present

## 2023-07-29 DIAGNOSIS — R262 Difficulty in walking, not elsewhere classified: Secondary | ICD-10-CM | POA: Diagnosis not present

## 2023-07-29 NOTE — Therapy (Signed)
OUTPATIENT PHYSICAL THERAPY THORACOLUMBAR EVALUATION   Patient Name: Jessica Mack MRN: [id number] DOB:06-Oct-1956, 67 y.o., female Today's Date: 07/29/2023  END OF SESSION:  PT End of Session - 07/29/23 1358     Visit Number 1    Number of Visits 14    Date for PT Re-Evaluation 09/23/23    Progress Note Due on Visit 10    PT Start Time 1302    PT Stop Time 1352    PT Time Calculation (min) 50 min    Activity Tolerance No increased pain;Patient tolerated treatment well;Patient limited by pain    Behavior During Therapy [facility] for tasks assessed/performed             Past Medical History:  Diagnosis Date   Avascular necrosis of bone (HCC)    Left talus   Blood transfusion    Breast cancer (HCC) 2005   left   Cancer (HCC)    Dysplastic nevus 1988   right hip   Fatty liver    GERD (gastroesophageal reflux disease)    History of breast cancer 2005   invasive ductal cancer left breast   Hypertension    Hypertriglyceridemia    Irritable bowel syndrome    Metabolic syndrome    resolved with diet and exercise   NASH (nonalcoholic steatohepatitis)    Nocturnal leg cramps 02/14/2015   Osteopenia    Palpitations 09/2012   normal echo   Peripheral neuropathy    Personal history of chemotherapy    Personal history of radiation therapy    Plantar fasciitis 03/2003   PONV (postoperative nausea and vomiting)    Tongue dysplasia    Ulcer    Past Surgical History:  Procedure Laterality Date   ANKLE SURGERY  07/2007   left, revascularization   BREAST LUMPECTOMY  06/2004   left, with port placement   CESAREAN SECTION  1991, 1993   x2   COLONOSCOPY  09/2013   Dr. Loreta Ave   DG GALL BLADDER     IRRIGATION AND DEBRIDEMENT ELBOW Left 05/04/2023   Procedure: LEFT ELBOW IRRIGATION AND DEBRIDEMENT;  Surgeon: Jessica Cote, MD;  Location: [facility];  Service: Orthopedics;  Laterality: Left;   LAPAROSCOPIC CHOLECYSTECTOMY  2003   MOUTH SURGERY  11/15   implant/crown  placed 4/16   SHOULDER ARTHROSCOPY  10/2005   right   SHOULDER SURGERY  12/15   left   TONGUE SURGERY  2012, 2013   WRIST SURGERY  1988   right, DeQuervains   Patient Active Problem List   Diagnosis Date Noted   Elevated ferritin level 04/05/2023   NASH (nonalcoholic steatohepatitis) 06/04/2020   Hypertriglyceridemia    Routine general medical examination at a health care facility 02/20/2020   Essential hypertension 02/20/2020   Osteopenia 08/05/2014   Breast cancer of upper-outer quadrant of left female breast (HCC) 07/20/2013   Leukoplakia of oral mucosa, including tongue 07/26/2012   Essential and other specified forms of tremor 07/26/2012   Drug-induced polyneuropathy (HCC) 07/26/2012    PCP: Jessica Grandchild, MD  REFERRING PROVIDER: [name], MD  REFERRING DIAG: Diagnosis M54.16 (ICD-10-CM) - Lumbar radiculopathy  Rationale for Evaluation and Treatment: Rehabilitation  THERAPY DIAG:  Abnormal posture - Plan: PT plan of care cert/re-cert  Difficulty in walking, not elsewhere classified - Plan: PT plan of care cert/re-cert  Muscle weakness (generalized) - Plan: PT plan of care cert/re-cert  Radiculopathy, lumbosacral region - Plan: PT plan of care cert/re-cert  Acute low back pain  with right-sided sciatica, unspecified back pain laterality - Plan: PT plan of care cert/re-cert  ONSET DATE: May 28th fall, Labor Day difficulty with stairs and right leg symptoms  SUBJECTIVE:                                                                                                                                                                                           SUBJECTIVE STATEMENT: On May 28th, 2024 Jessica Mack fell playing pickle ball.  She had an olecranon bursitis as a result and she ended up having the bursa removed.  She is typically very active with weights, walking, pickle ball and Pilates.  After her fall and elbow surgery, she noted difficulty with stairs (starting  around Labor Day).  [name] has had 2 previous epidurals, 1st resulted in 40% improvement while the 2nd provided no relief.  She is scheduled to see Dr. Christell Mack on 08/09/2023.  PERTINENT HISTORY:  Left talus avascular necrosis, breast cancer, peripheral neuropathy, ankle, shoulder and wrist surgeries, left elbow olecranon bursectomy  PAIN:  Are you having pain? Yes: NPRS scale: 3-8/10 this week on a 10/10 Pain location: Right gluteals and lower extremity as distal as the lateral malleolus Pain description: Ache, burning, sharp Aggravating factors: Standing with or without support, lying prone Relieving factors: Heat, steroid dose pack  PRECAUTIONS: Back  RED FLAGS: None   WEIGHT BEARING RESTRICTIONS: No  FALLS:  Has patient fallen in last 6 months? Yes. Number of falls 1 with pickle ball that may have contributed to this episode  LIVING ENVIRONMENT: Lives with: lives with their family and lives with their spouse Lives in: House/apartment Stairs:  Can do one at a time with a rail Has following equipment at home: Environmental consultant - 4 wheeled and shower chair  OCCUPATION: Retired Tourist information centre manager, very physically active  PLOF: Independent  PATIENT GOALS: Get rid of leg pain, get rid of walker, return to walking, Pilates and pickle ball  NEXT MD VISIT: Dr. Christell Mack 10/14/2024IMPRESSION:  OBJECTIVE:  Note: Objective measures were completed at Evaluation unless otherwise noted.  DIAGNOSTIC FINDINGS:  1. Right paracentral disc protrusion at L4-L5 severely narrows the right lateral recess. 2. Severe right neural foraminal narrowing at L5-S1 secondary to a right foraminal disc protrusion. 3. Chronic nonspecific inward buckling of the anterior abdominal wall on the right. Recommend correlation with physical exam.  Complete view x-ray of the lumbar spine including AP, lateral,  flexion/extension views were ordered and reviewed by myself today.  X-rays  demonstrate multilevel spondylosis most notable  at L4-L5 and L3-L4.  There  is anterior spurring noted at these locations with some endplate sclerosis  between L4 and L5.  There is no retro-/anterograde listhesis with  flexion/extension views.  No acute compression fracture noted.  PATIENT SURVEYS:  FOTO 39 (Goal 62 in 14 visits)  SCREENING FOR RED FLAGS: Bowel or bladder incontinence: No Spinal tumors: No Cauda equina syndrome: No Compression fracture: No  COGNITION: Overall cognitive status: Within functional limits for tasks assessed     SENSATION: Jessica Mack notes right sided lower extremity symptoms as distal as the lateral malleolus  MUSCLE LENGTH: Hamstrings: Right 30 deg; Left 35 deg  POSTURE: rounded shoulders, forward head, and decreased lumbar lordosis   LUMBAR ROM:   AROM 07/29/2023  Flexion   Extension 0 (straight)  Right lateral flexion   Left lateral flexion   Right rotation   Left rotation    (Blank rows = not tested)  LOWER EXTREMITY ROM:     Passive  Left/Right 07/29/2023   Hip flexion 115/115   Hip extension    Hip abduction    Hip adduction    Hip internal rotation 10/14   Hip external rotation 20/33   Knee flexion    Knee extension    Ankle dorsiflexion    Ankle plantarflexion    Ankle inversion    Ankle eversion     (Blank rows = not tested)  STRENGTH:  Deferred at exam secondary to time constraints  STRENGTH Left/Right 07/29/2023   Hip flexion    Hip extension    Hip abduction    Hip adduction    Hip internal rotation    Hip external rotation    Knee flexion    Knee extension    Ankle dorsiflexion    Ankle plantarflexion    Ankle inversion    Ankle eversion    Lumbar extension     (Blank rows = not tested)   GAIT: Distance walked: 30-40 feet Assistive device utilized: Walker - 4 wheeled Level of assistance: Complete Independence Comments: Flexed posture.  Was not using a walker before Labor Day.  TODAY'S TREATMENT:                                                                                                                               DATE: 07/29/2023 Shoulder blade pinches 5 x 5 seconds Lumbar extension AROM 5 x 3 seconds  Functional Activities: Spine anatomy education going over imaging with spine model, education on disc pressures in various positions, traction education, practical log roll   PATIENT EDUCATION:  Education details: See above Person educated: Patient Education method: Explanation, Demonstration, Tactile cues, Verbal cues, and Handouts Education comprehension: verbalized understanding, returned demonstration, verbal cues required, tactile cues required, and needs further education  HOME EXERCISE PROGRAM: [id number]  ASSESSMENT:  CLINICAL IMPRESSION: Patient is a 67 y.o. female who was seen today for physical therapy evaluation and treatment for Diagnosis M54.16 (ICD-10-CM) - Lumbar radiculopathy.  Jessica Mack has 2 significantly herniated discs visible on MRI that are likely the origin of her right  sided radicular symptoms.  She has a very difficult time with standing or walking for any period of time and we discussed the need to reduce the disc to free the nerve root and allow for improvements in her peripheral symptoms.  We also discussed possibly using lumbar traction to facilitate this.  She is following up with Dr. Christell Mack on 08/09/2023 and a reassessment will be provided at that time so that Dr. Christell Mack can make additional recommendations.  OBJECTIVE IMPAIRMENTS: Abnormal gait, decreased activity tolerance, decreased endurance, difficulty walking, decreased ROM, decreased strength, impaired perceived functional ability, increased muscle spasms, impaired flexibility, improper body mechanics, postural dysfunction, and pain.   ACTIVITY LIMITATIONS: carrying, lifting, standing, stairs, bed mobility, and locomotion level  PARTICIPATION LIMITATIONS: community activity  PERSONAL FACTORS: Left talus avascular necrosis, breast cancer, peripheral  neuropathy, ankle, shoulder and wrist surgeries, left elbow olecranon bursectomy are also affecting patient's functional outcome.   REHAB POTENTIAL: Fair we will focus on trying to reduce her disc herniations including use of lumbar traction.  Reassessment will be completed in 2 weeks before her follow-up with Dr. Christell Mack to help him make additional recommendations.  CLINICAL DECISION MAKING: Stable/uncomplicated  EVALUATION COMPLEXITY: Low   GOALS: Goals reviewed with patient? Yes  SHORT TERM GOALS: Target date: 08/26/2023  Jessica Mack will be independent with her day 1 home exercise program Baseline: Started 07/29/2023 Goal status: INITIAL  2.  Improve standing lumbar extension AROM to at least 10 degrees Baseline: Neutral or 0 degrees Goal status: INITIAL  3.  Jessica Mack will be able to stand or walk for 5 minutes or more without being stopped by right sided radicular symptoms Baseline: Unable to stand or walk comfortably for more than a minute at best Goal status: INITIAL   LONG TERM GOALS: Target date: 09/23/2023  Improve FOTO to 62 in 14 visits Baseline: 39 Goal status: INITIAL  2.  Jessica Mack will report right lower extremity pain consistently 0-2/10 on the numeric pain rating scale Baseline: 3-8/10 Goal status: INITIAL  3.  Jessica Mack will be able to stand and walk for 20 minutes or more without being stopped by right sided lower extremity radicular symptoms Baseline: A minute or less Goal status: INITIAL  4.  Jessica Mack will be able to return to walking for exercise without the use of her walker Baseline: Using a walker at evaluation Goal status: INITIAL  5.  Jessica Mack will be independent with her long-term maintenance home exercise program at discharge Baseline: Started 07/29/2023 Goal status: INITIAL   PLAN:  PT FREQUENCY: 3x/week  PT DURATION: 4 weeks  PLANNED INTERVENTIONS: Therapeutic exercises, Therapeutic activity, Neuromuscular re-education, Balance training, Gait training,  Patient/Family education, Self Care, Dry Needling, Cryotherapy, Moist heat, Traction, and Manual therapy.  PLAN FOR NEXT SESSION: Review day 1 home exercise program and consider lumbar traction along with anything else that can reduce the disc and her right sided peripheral symptoms.  Reassessment will be completed before follow-up with Dr. Christell Mack on 08/09/2023.   Cherlyn Cushing, PT, MPT 07/29/2023, 4:34 PM

## 2023-07-30 ENCOUNTER — Encounter: Payer: Self-pay | Admitting: Rehabilitative and Restorative Service Providers"

## 2023-07-30 ENCOUNTER — Ambulatory Visit: Payer: PPO | Admitting: Rehabilitative and Restorative Service Providers"

## 2023-07-30 ENCOUNTER — Other Ambulatory Visit: Payer: Self-pay | Admitting: Radiology

## 2023-07-30 DIAGNOSIS — M5416 Radiculopathy, lumbar region: Secondary | ICD-10-CM

## 2023-07-30 DIAGNOSIS — R293 Abnormal posture: Secondary | ICD-10-CM | POA: Diagnosis not present

## 2023-07-30 DIAGNOSIS — G8929 Other chronic pain: Secondary | ICD-10-CM

## 2023-07-30 DIAGNOSIS — M5417 Radiculopathy, lumbosacral region: Secondary | ICD-10-CM

## 2023-07-30 DIAGNOSIS — R262 Difficulty in walking, not elsewhere classified: Secondary | ICD-10-CM

## 2023-07-30 DIAGNOSIS — M6281 Muscle weakness (generalized): Secondary | ICD-10-CM

## 2023-07-30 DIAGNOSIS — M5116 Intervertebral disc disorders with radiculopathy, lumbar region: Secondary | ICD-10-CM

## 2023-07-30 DIAGNOSIS — M5441 Lumbago with sciatica, right side: Secondary | ICD-10-CM

## 2023-07-30 NOTE — Therapy (Signed)
OUTPATIENT PHYSICAL THERAPY THORACOLUMBAR EVALUATION   Patient Name: JHENESIS KEEFAUVER MRN: [id number] DOB:03-24-1956, 67 y.o., female Today's Date: 07/30/2023  END OF SESSION:  PT End of Session - 07/30/23 1301     Visit Number 2    Number of Visits 14    Date for PT Re-Evaluation 09/23/23    Progress Note Due on Visit 10    PT Start Time 1301    PT Stop Time 1345    PT Time Calculation (min) 44 min    Activity Tolerance No increased pain;Patient tolerated treatment well;Patient limited by pain    Behavior During Therapy [facility] for tasks assessed/performed              Past Medical History:  Diagnosis Date   Avascular necrosis of bone (HCC)    Left talus   Blood transfusion    Breast cancer (HCC) 2005   left   Cancer (HCC)    Dysplastic nevus 1988   right hip   Fatty liver    GERD (gastroesophageal reflux disease)    History of breast cancer 2005   invasive ductal cancer left breast   Hypertension    Hypertriglyceridemia    Irritable bowel syndrome    Metabolic syndrome    resolved with diet and exercise   NASH (nonalcoholic steatohepatitis)    Nocturnal leg cramps 02/14/2015   Osteopenia    Palpitations 09/2012   normal echo   Peripheral neuropathy    Personal history of chemotherapy    Personal history of radiation therapy    Plantar fasciitis 03/2003   PONV (postoperative nausea and vomiting)    Tongue dysplasia    Ulcer    Past Surgical History:  Procedure Laterality Date   ANKLE SURGERY  07/2007   left, revascularization   BREAST LUMPECTOMY  06/2004   left, with port placement   CESAREAN SECTION  1991, 1993   x2   COLONOSCOPY  09/2013   Dr. Loreta Ave   DG GALL BLADDER     IRRIGATION AND DEBRIDEMENT ELBOW Left 05/04/2023   Procedure: LEFT ELBOW IRRIGATION AND DEBRIDEMENT;  Surgeon: [name], MD;  Location: [facility];  Service: Orthopedics;  Laterality: Left;   LAPAROSCOPIC CHOLECYSTECTOMY  2003   MOUTH SURGERY  11/15    implant/crown placed 4/16   SHOULDER ARTHROSCOPY  10/2005   right   SHOULDER SURGERY  12/15   left   TONGUE SURGERY  2012, 2013   WRIST SURGERY  1988   right, DeQuervains   Patient Active Problem List   Diagnosis Date Noted   Elevated ferritin level 04/05/2023   NASH (nonalcoholic steatohepatitis) 06/04/2020   Hypertriglyceridemia    Routine general medical examination at a health care facility 02/20/2020   Essential hypertension 02/20/2020   Osteopenia 08/05/2014   Breast cancer of upper-outer quadrant of left female breast (HCC) 07/20/2013   Leukoplakia of oral mucosa, including tongue 07/26/2012   Essential and other specified forms of tremor 07/26/2012   Drug-induced polyneuropathy (HCC) 07/26/2012    PCP: Etta Grandchild, MD  REFERRING PROVIDER: [name], MD  REFERRING DIAG: Diagnosis M54.16 (ICD-10-CM) - Lumbar radiculopathy  Rationale for Evaluation and Treatment: Rehabilitation  THERAPY DIAG:  Abnormal posture  Difficulty in walking, not elsewhere classified  Muscle weakness (generalized)  Radiculopathy, lumbosacral region  Acute low back pain with right-sided sciatica, unspecified back pain laterality  ONSET DATE: May 28th fall, Labor Day difficulty with stairs and right leg symptoms  SUBJECTIVE:  SUBJECTIVE STATEMENT: On May 28th, 2024 Richardine fell playing pickle ball.  She had an olecranon bursitis as a result and she ended up having the bursa removed.  She is typically very active with weights, walking, pickle ball and Pilates.  After her fall and elbow surgery, she noted difficulty with stairs (starting around Labor Day).  Sumaira has had 2 previous epidurals, 1st resulted in 40% improvement while the 2nd provided no relief.  She is scheduled to see Dr. Christell Constant on  08/09/2023.  PERTINENT HISTORY:  Left talus avascular necrosis, breast cancer, peripheral neuropathy, ankle, shoulder and wrist surgeries, left elbow olecranon bursectomy  PAIN:  Are you having pain? Yes: NPRS scale: 3-8/10 this week on a 10/10 Pain location: Right gluteals and lower extremity as distal as the lateral malleolus Pain description: Ache, burning, sharp Aggravating factors: Standing with or without support, lying prone Relieving factors: Heat, steroid dose pack  PRECAUTIONS: Back  RED FLAGS: None   WEIGHT BEARING RESTRICTIONS: No  FALLS:  Has patient fallen in last 6 months? Yes. Number of falls 1 with pickle ball that may have contributed to this episode  LIVING ENVIRONMENT: Lives with: lives with their family and lives with their spouse Lives in: House/apartment Stairs:  Can do one at a time with a rail Has following equipment at home: Environmental consultant - 4 wheeled and shower chair  OCCUPATION: Retired Tourist information centre manager, very physically active  PLOF: Independent  PATIENT GOALS: Get rid of leg pain, get rid of walker, return to walking, Pilates and pickle ball  NEXT MD VISIT: Dr. Christell Constant 10/14/2024IMPRESSION:  OBJECTIVE:  Note: Objective measures were completed at Evaluation unless otherwise noted.  DIAGNOSTIC FINDINGS:  1. Right paracentral disc protrusion at L4-L5 severely narrows the right lateral recess. 2. Severe right neural foraminal narrowing at L5-S1 secondary to a right foraminal disc protrusion. 3. Chronic nonspecific inward buckling of the anterior abdominal wall on the right. Recommend correlation with physical exam.  Complete view x-ray of the lumbar spine including AP, lateral,  flexion/extension views were ordered and reviewed by myself today.  X-rays  demonstrate multilevel spondylosis most notable at L4-L5 and L3-L4.  There  is anterior spurring noted at these locations with some endplate sclerosis  between L4 and L5.  There is no retro-/anterograde  listhesis with  flexion/extension views.  No acute compression fracture noted.  PATIENT SURVEYS:  FOTO 39 (Goal 62 in 14 visits)  SCREENING FOR RED FLAGS: Bowel or bladder incontinence: No Spinal tumors: No Cauda equina syndrome: No Compression fracture: No  COGNITION: Overall cognitive status: Within functional limits for tasks assessed     SENSATION: Joelie notes right sided lower extremity symptoms as distal as the lateral malleolus  MUSCLE LENGTH: Hamstrings: Right 30 deg; Left 35 deg  POSTURE: rounded shoulders, forward head, and decreased lumbar lordosis   LUMBAR ROM:   AROM 07/29/2023  Flexion   Extension 0 (straight)  Right lateral flexion   Left lateral flexion   Right rotation   Left rotation    (Blank rows = not tested)  LOWER EXTREMITY ROM:     Passive  Left/Right 07/29/2023   Hip flexion 115/115   Hip extension    Hip abduction    Hip adduction    Hip internal rotation 10/14   Hip external rotation 20/33   Knee flexion    Knee extension    Ankle dorsiflexion    Ankle plantarflexion    Ankle inversion    Ankle eversion     (  Blank rows = not tested)  STRENGTH:  Deferred at exam secondary to time constraints  STRENGTH Left/Right 07/29/2023   Hip flexion    Hip extension    Hip abduction    Hip adduction    Hip internal rotation    Hip external rotation    Knee flexion    Knee extension    Ankle dorsiflexion    Ankle plantarflexion    Ankle inversion    Ankle eversion    Lumbar extension     (Blank rows = not tested)   GAIT: Distance walked: 30-40 feet Assistive device utilized: Walker - 4 wheeled Level of assistance: Complete Independence Comments: Flexed posture.  Was not using a walker before Labor Day.  TODAY'S TREATMENT:                                                                                                                              DATE:  07/30/2023 Lumbar traction 25-75# 3 steps up/down intermittent for 15 minutes  (plus set-up and clean-up)  Shoulder blade pinches 5 x 5 seconds Lumbar extension AROM 5 and 3 x 3 seconds  Functional Activities: Reviewed education on disc pressures in various positions, traction education, practical log roll and the importance of avoiding flexion with rotation   07/29/2023 Shoulder blade pinches 5 x 5 seconds Lumbar extension AROM 5 x 3 seconds  Functional Activities: Spine anatomy education going over imaging with spine model, education on disc pressures in various positions, traction education, practical log roll   PATIENT EDUCATION:  Education details: See above Person educated: Patient Education method: Explanation, Demonstration, Tactile cues, Verbal cues, and Handouts Education comprehension: verbalized understanding, returned demonstration, verbal cues required, tactile cues required, and needs further education  HOME EXERCISE PROGRAM: [id number]  ASSESSMENT:  CLINICAL IMPRESSION: [place] reports good compliance with her home exercise program yesterday and this morning.  She has 2 significantly herniated discs visible on MRI that are likely the origin of her right sided radicular symptoms.  Standing or walking for any period of time is extremely difficult at this time.  She is following up with Dr. Christell Constant on 08/09/2023 and a reassessment will be provided at that time so that Dr. Christell Constant can make additional recommendations.  OBJECTIVE IMPAIRMENTS: Abnormal gait, decreased activity tolerance, decreased endurance, difficulty walking, decreased ROM, decreased strength, impaired perceived functional ability, increased muscle spasms, impaired flexibility, improper body mechanics, postural dysfunction, and pain.   ACTIVITY LIMITATIONS: carrying, lifting, standing, stairs, bed mobility, and locomotion level  PARTICIPATION LIMITATIONS: community activity  PERSONAL FACTORS: Left talus avascular necrosis, breast cancer, peripheral neuropathy, ankle, shoulder and wrist  surgeries, left elbow olecranon bursectomy are also affecting patient's functional outcome.   REHAB POTENTIAL: Fair we will focus on trying to reduce her disc herniations including use of lumbar traction.  Reassessment will be completed in 2 weeks before her follow-up with Dr. Christell Constant to help him make additional recommendations.  CLINICAL DECISION MAKING: Stable/uncomplicated  EVALUATION COMPLEXITY: Low   GOALS: Goals reviewed with patient? Yes  SHORT TERM GOALS: Target date: 08/26/2023  Monea will be independent with her day 1 home exercise program Baseline: Started 07/29/2023 Goal status: Ongoing 07/30/2023  2.  Improve standing lumbar extension AROM to at least 10 degrees Baseline: Neutral or 0 degrees Goal status: INITIAL  3.  Taka will be able to stand or walk for 5 minutes or more without being stopped by right sided radicular symptoms Baseline: Unable to stand or walk comfortably for more than a minute at best Goal status: Ongoing 07/30/2023   LONG TERM GOALS: Target date: 09/23/2023  Improve FOTO to 62 in 14 visits Baseline: 39 Goal status: INITIAL  2.  Sarissa will report right lower extremity pain consistently 0-2/10 on the numeric pain rating scale Baseline: 3-8/10 Goal status: INITIAL  3.  Richardine will be able to stand and walk for 20 minutes or more without being stopped by right sided lower extremity radicular symptoms Baseline: A minute or less Goal status: INITIAL  4.  Machenzie will be able to return to walking for exercise without the use of her walker Baseline: Using a walker at evaluation Goal status: INITIAL  5.  Areon will be independent with her long-term maintenance home exercise program at discharge Baseline: Started 07/29/2023 Goal status: INITIAL   PLAN:  PT FREQUENCY: 3x/week  PT DURATION: 4 weeks  PLANNED INTERVENTIONS: Therapeutic exercises, Therapeutic activity, Neuromuscular re-education, Balance training, Gait training, Patient/Family education,  Self Care, Dry Needling, Cryotherapy, Moist heat, Traction, and Manual therapy.  PLAN FOR NEXT SESSION: Review home exercise program and continue lumbar traction along with anything else that can reduce the disc and her right sided peripheral symptoms.  Reassessment will be completed before follow-up with Dr. Christell Constant on 08/09/2023.   Cherlyn Cushing, PT, MPT 07/30/2023, 2:36 PM

## 2023-08-02 ENCOUNTER — Ambulatory Visit: Payer: PPO | Admitting: Physical Therapy

## 2023-08-02 ENCOUNTER — Encounter: Payer: Self-pay | Admitting: Physical Therapy

## 2023-08-02 DIAGNOSIS — M6281 Muscle weakness (generalized): Secondary | ICD-10-CM | POA: Diagnosis not present

## 2023-08-02 DIAGNOSIS — M5441 Lumbago with sciatica, right side: Secondary | ICD-10-CM

## 2023-08-02 DIAGNOSIS — R262 Difficulty in walking, not elsewhere classified: Secondary | ICD-10-CM

## 2023-08-02 DIAGNOSIS — M5417 Radiculopathy, lumbosacral region: Secondary | ICD-10-CM

## 2023-08-02 DIAGNOSIS — R293 Abnormal posture: Secondary | ICD-10-CM

## 2023-08-02 NOTE — Therapy (Signed)
OUTPATIENT PHYSICAL THERAPY THORACOLUMBAR    Patient Name: Jessica Mack MRN: [id number] DOB:1956/04/14, 67 y.o., female Today's Date: 08/02/2023  END OF SESSION:  PT End of Session - 08/02/23 1500     Visit Number 3    Number of Visits 14    Date for PT Re-Evaluation 09/23/23    Progress Note Due on Visit 10    PT Start Time 1358    PT Stop Time 1445    PT Time Calculation (min) 47 min    Activity Tolerance Patient limited by pain;Patient tolerated treatment well    Behavior During Therapy [facility] for tasks assessed/performed               Past Medical History:  Diagnosis Date   Avascular necrosis of bone (HCC)    Left talus   Blood transfusion    Breast cancer (HCC) 2005   left   Cancer (HCC)    Dysplastic nevus 1988   right hip   Fatty liver    GERD (gastroesophageal reflux disease)    History of breast cancer 2005   invasive ductal cancer left breast   Hypertension    Hypertriglyceridemia    Irritable bowel syndrome    Metabolic syndrome    resolved with diet and exercise   NASH (nonalcoholic steatohepatitis)    Nocturnal leg cramps 02/14/2015   Osteopenia    Palpitations 09/2012   normal echo   Peripheral neuropathy    Personal history of chemotherapy    Personal history of radiation therapy    Plantar fasciitis 03/2003   PONV (postoperative nausea and vomiting)    Tongue dysplasia    Ulcer    Past Surgical History:  Procedure Laterality Date   ANKLE SURGERY  07/2007   left, revascularization   BREAST LUMPECTOMY  06/2004   left, with port placement   CESAREAN SECTION  1991, 1993   x2   COLONOSCOPY  09/2013   Dr. Loreta Ave   DG GALL BLADDER     IRRIGATION AND DEBRIDEMENT ELBOW Left 05/04/2023   Procedure: LEFT ELBOW IRRIGATION AND DEBRIDEMENT;  Surgeon: [name], MD;  Location: [facility];  Service: Orthopedics;  Laterality: Left;   LAPAROSCOPIC CHOLECYSTECTOMY  2003   MOUTH SURGERY  11/15   implant/crown placed 4/16   SHOULDER  ARTHROSCOPY  10/2005   right   SHOULDER SURGERY  12/15   left   TONGUE SURGERY  2012, 2013   WRIST SURGERY  1988   right, DeQuervains   Patient Active Problem List   Diagnosis Date Noted   Elevated ferritin level 04/05/2023   NASH (nonalcoholic steatohepatitis) 06/04/2020   Hypertriglyceridemia    Routine general medical examination at a health care facility 02/20/2020   Essential hypertension 02/20/2020   Osteopenia 08/05/2014   Breast cancer of upper-outer quadrant of left female breast (HCC) 07/20/2013   Leukoplakia of oral mucosa, including tongue 07/26/2012   Essential and other specified forms of tremor 07/26/2012   Drug-induced polyneuropathy (HCC) 07/26/2012    PCP: Etta Grandchild, MD  REFERRING PROVIDER: [name], MD  REFERRING DIAG: Diagnosis M54.16 (ICD-10-CM) - Lumbar radiculopathy  Rationale for Evaluation and Treatment: Rehabilitation  THERAPY DIAG:  Abnormal posture  Difficulty in walking, not elsewhere classified  Muscle weakness (generalized)  Radiculopathy, lumbosacral region  Acute low back pain with right-sided sciatica, unspecified back pain laterality  ONSET DATE: May 28th fall, Labor Day difficulty with stairs and right leg symptoms  SUBJECTIVE:  SUBJECTIVE STATEMENT: Pt arriving today reporting 4/10 pain in Rt side low back. Pt stating she felt traction seemed to help some but her pain is still extending down her right leg into her Rt ankle. Pt reporting taking a flexeril prior therapy.    On May 28th, 2024 Jessica Mack fell playing pickle ball.  She had an olecranon bursitis as a result and she ended up having the bursa removed.  She is typically very active with weights, walking, pickle ball and Pilates.  After her fall and elbow surgery, she noted difficulty with  stairs (starting around Labor Day).  Jessica Mack has had 2 previous epidurals, 1st resulted in 40% improvement while the 2nd provided no relief.  She is scheduled to see Dr. Christell Constant on 08/09/2023.  PERTINENT HISTORY:  Left talus avascular necrosis, breast cancer, peripheral neuropathy, ankle, shoulder and wrist surgeries, left elbow olecranon bursectomy  PAIN:  Are you having pain? Yes: NPRS scale: 4/10 this week on a 10/10 Pain location: Right gluteals and lower extremity as distal as the lateral malleolus Pain description: Ache, burning, sharp Aggravating factors: Standing with or without support, lying prone Relieving factors: Heat, steroid dose pack  PRECAUTIONS: Back  RED FLAGS: None   WEIGHT BEARING RESTRICTIONS: No  FALLS:  Has patient fallen in last 6 months? Yes. Number of falls 1 with pickle ball that may have contributed to this episode  LIVING ENVIRONMENT: Lives with: lives with their family and lives with their spouse Lives in: House/apartment Stairs:  Can do one at a time with a rail Has following equipment at home: Environmental consultant - 4 wheeled and shower chair  OCCUPATION: Retired Tourist information centre manager, very physically active  PLOF: Independent  PATIENT GOALS: Get rid of leg pain, get rid of walker, return to walking, Pilates and pickle ball  NEXT MD VISIT: Dr. Christell Constant 10/14/2024IMPRESSION:  OBJECTIVE:  Note: Objective measures were completed at Evaluation unless otherwise noted.  DIAGNOSTIC FINDINGS:  1. Right paracentral disc protrusion at L4-L5 severely narrows the right lateral recess. 2. Severe right neural foraminal narrowing at L5-S1 secondary to a right foraminal disc protrusion. 3. Chronic nonspecific inward buckling of the anterior abdominal wall on the right. Recommend correlation with physical exam.  Complete view x-ray of the lumbar spine including AP, lateral,  flexion/extension views were ordered and reviewed by myself today.  X-rays  demonstrate multilevel  spondylosis most notable at L4-L5 and L3-L4.  There  is anterior spurring noted at these locations with some endplate sclerosis  between L4 and L5.  There is no retro-/anterograde listhesis with  flexion/extension views.  No acute compression fracture noted.  PATIENT SURVEYS:  FOTO 39 (Goal 62 in 14 visits)  SCREENING FOR RED FLAGS: Bowel or bladder incontinence: No Spinal tumors: No Cauda equina syndrome: No Compression fracture: No  COGNITION: Overall cognitive status: Within functional limits for tasks assessed     SENSATION: Jessica Mack notes right sided lower extremity symptoms as distal as the lateral malleolus  MUSCLE LENGTH: Hamstrings: Right 30 deg; Left 35 deg  POSTURE: rounded shoulders, forward head, and decreased lumbar lordosis   LUMBAR ROM:   AROM 07/29/2023  Flexion   Extension 0 (straight)  Right lateral flexion   Left lateral flexion   Right rotation   Left rotation    (Blank rows = not tested)  LOWER EXTREMITY ROM:     Passive  Left/Right 07/29/2023   Hip flexion 115/115   Hip extension    Hip abduction    Hip adduction  Hip internal rotation 10/14   Hip external rotation 20/33   Knee flexion    Knee extension    Ankle dorsiflexion    Ankle plantarflexion    Ankle inversion    Ankle eversion     (Blank rows = not tested)  STRENGTH:  Deferred at exam secondary to time constraints  STRENGTH Left/Right 07/29/2023   Hip flexion    Hip extension    Hip abduction    Hip adduction    Hip internal rotation    Hip external rotation    Knee flexion    Knee extension    Ankle dorsiflexion    Ankle plantarflexion    Ankle inversion    Ankle eversion    Lumbar extension     (Blank rows = not tested)   GAIT: Distance walked: 30-40 feet Assistive device utilized: Walker - 4 wheeled Level of assistance: Complete Independence Comments: Flexed posture.  Was not using a walker before Labor Day.  TODAY'S TREATMENT:                                                                                                                               DATE:  08/02/23:  TherEx:  Nustep: x 5 minutes UE/LE level 5 Supine: trunk rotation x 5 holding 30 seconds Supine marching x 20 alternating Sidelying clam shells x 15 on Rt Scapular retraction: x 10 holding 5 sec Manual:  Gentle hip distraction in supine with hips slightly  Mechanical Traction:  Lumbar Protocol: intermittent traction: max pull 80#, min pull 55# for 20 minutes      07/30/2023 Lumbar traction 25-75# 3 steps up/down intermittent for 15 minutes (plus set-up and clean-up)  Shoulder blade pinches 5 x 5 seconds Lumbar extension AROM 5 and 3 x 3 seconds  Functional Activities: Reviewed education on disc pressures in various positions, traction education, practical log roll and the importance of avoiding flexion with rotation   07/29/2023 Shoulder blade pinches 5 x 5 seconds Lumbar extension AROM 5 x 3 seconds  Functional Activities: Spine anatomy education going over imaging with spine model, education on disc pressures in various positions, traction education, practical log roll   PATIENT EDUCATION:  Education details: See above Person educated: Patient Education method: Explanation, Demonstration, Tactile cues, Verbal cues, and Handouts Education comprehension: verbalized understanding, returned demonstration, verbal cues required, tactile cues required, and needs further education  HOME EXERCISE PROGRAM: [id number]  ASSESSMENT:  CLINICAL IMPRESSION: Pt arriving reporting no change in her initial symptoms. Pt willing to try mechanical traction again to see if her response is better with decreased in symptoms. Pt tolerating traction well. However as soon as pt transferred from supine to sit her radicular symptoms returned down her Rt LE. Recommend continue skilled PT visits before returning to Dr. Christell Constant on 08/09/23.   OBJECTIVE IMPAIRMENTS: Abnormal gait, decreased  activity tolerance, decreased endurance, difficulty walking, decreased ROM, decreased strength, impaired perceived functional ability, increased muscle spasms,  impaired flexibility, improper body mechanics, postural dysfunction, and pain.   ACTIVITY LIMITATIONS: carrying, lifting, standing, stairs, bed mobility, and locomotion level  PARTICIPATION LIMITATIONS: community activity  PERSONAL FACTORS: Left talus avascular necrosis, breast cancer, peripheral neuropathy, ankle, shoulder and wrist surgeries, left elbow olecranon bursectomy are also affecting patient's functional outcome.   REHAB POTENTIAL: Fair we will focus on trying to reduce her disc herniations including use of lumbar traction.  Reassessment will be completed in 2 weeks before her follow-up with Dr. Christell Constant to help him make additional recommendations.  CLINICAL DECISION MAKING: Stable/uncomplicated  EVALUATION COMPLEXITY: Low   GOALS: Goals reviewed with patient? Yes  SHORT TERM GOALS: Target date: 08/26/2023  Jessica Mack will be independent with her day 1 home exercise program Baseline: Started 07/29/2023 Goal status: Ongoing 07/30/2023  2.  Improve standing lumbar extension AROM to at least 10 degrees Baseline: Neutral or 0 degrees Goal status: INITIAL  3.  Jessica Mack will be able to stand or walk for 5 minutes or more without being stopped by right sided radicular symptoms Baseline: Unable to stand or walk comfortably for more than a minute at best Goal status: Ongoing 07/30/2023   LONG TERM GOALS: Target date: 09/23/2023  Improve FOTO to 62 in 14 visits Baseline: 39 Goal status: INITIAL  2.  Jessica Mack will report right lower extremity pain consistently 0-2/10 on the numeric pain rating scale Baseline: 3-8/10 Goal status: INITIAL  3.  Jessica Mack will be able to stand and walk for 20 minutes or more without being stopped by right sided lower extremity radicular symptoms Baseline: A minute or less Goal status: INITIAL  4.  Jessica Mack will  be able to return to walking for exercise without the use of her walker Baseline: Using a walker at evaluation Goal status: INITIAL  5.  Jessica Mack will be independent with her long-term maintenance home exercise program at discharge Baseline: Started 07/29/2023 Goal status: INITIAL   PLAN:  PT FREQUENCY: 3x/week  PT DURATION: 4 weeks  PLANNED INTERVENTIONS: Therapeutic exercises, Therapeutic activity, Neuromuscular re-education, Balance training, Gait training, Patient/Family education, Self Care, Dry Needling, Cryotherapy, Moist heat, Traction, and Manual therapy.  PLAN FOR NEXT SESSION: Review home exercise program and continue lumbar traction along with anything else that can reduce the disc and her right sided peripheral symptoms.  Reassessment will be completed before follow-up with Dr. Christell Constant on 08/09/2023.   Sharmon Leyden, PT, MPT 08/02/2023, 3:07 PM

## 2023-08-04 ENCOUNTER — Ambulatory Visit: Payer: PPO | Admitting: Rehabilitative and Restorative Service Providers"

## 2023-08-04 ENCOUNTER — Encounter (HOSPITAL_BASED_OUTPATIENT_CLINIC_OR_DEPARTMENT_OTHER): Payer: Self-pay | Admitting: Orthopaedic Surgery

## 2023-08-04 ENCOUNTER — Encounter: Payer: Self-pay | Admitting: Rehabilitative and Restorative Service Providers"

## 2023-08-04 DIAGNOSIS — M5417 Radiculopathy, lumbosacral region: Secondary | ICD-10-CM

## 2023-08-04 DIAGNOSIS — R293 Abnormal posture: Secondary | ICD-10-CM | POA: Diagnosis not present

## 2023-08-04 DIAGNOSIS — M6281 Muscle weakness (generalized): Secondary | ICD-10-CM

## 2023-08-04 DIAGNOSIS — R262 Difficulty in walking, not elsewhere classified: Secondary | ICD-10-CM

## 2023-08-04 DIAGNOSIS — M5441 Lumbago with sciatica, right side: Secondary | ICD-10-CM

## 2023-08-04 NOTE — Therapy (Signed)
OUTPATIENT PHYSICAL THERAPY THORACOLUMBAR TREATMENT   Patient Name: Jessica Mack MRN: [id number] DOB:1956/01/11, 67 y.o., female Today's Date: 08/04/2023  END OF SESSION:  PT End of Session - 08/04/23 1057     Visit Number 4    Number of Visits 14    Date for PT Re-Evaluation 09/23/23    Progress Note Due on Visit 10    PT Start Time 1014    PT Stop Time 1055    PT Time Calculation (min) 41 min    Activity Tolerance Patient limited by pain;Patient tolerated treatment well;No increased pain    Behavior During Therapy [facility] for tasks assessed/performed             Past Medical History:  Diagnosis Date   Avascular necrosis of bone (HCC)    Left talus   Blood transfusion    Breast cancer (HCC) 2005   left   Cancer (HCC)    Dysplastic nevus 1988   right hip   Fatty liver    GERD (gastroesophageal reflux disease)    History of breast cancer 2005   invasive ductal cancer left breast   Hypertension    Hypertriglyceridemia    Irritable bowel syndrome    Metabolic syndrome    resolved with diet and exercise   NASH (nonalcoholic steatohepatitis)    Nocturnal leg cramps 02/14/2015   Osteopenia    Palpitations 09/2012   normal echo   Peripheral neuropathy    Personal history of chemotherapy    Personal history of radiation therapy    Plantar fasciitis 03/2003   PONV (postoperative nausea and vomiting)    Tongue dysplasia    Ulcer    Past Surgical History:  Procedure Laterality Date   ANKLE SURGERY  07/2007   left, revascularization   BREAST LUMPECTOMY  06/2004   left, with port placement   CESAREAN SECTION  1991, 1993   x2   COLONOSCOPY  09/2013   Dr. Loreta Ave   DG GALL BLADDER     IRRIGATION AND DEBRIDEMENT ELBOW Left 05/04/2023   Procedure: LEFT ELBOW IRRIGATION AND DEBRIDEMENT;  Surgeon: [name], MD;  Location: [facility];  Service: Orthopedics;  Laterality: Left;   LAPAROSCOPIC CHOLECYSTECTOMY  2003   MOUTH SURGERY  11/15   implant/crown  placed 4/16   SHOULDER ARTHROSCOPY  10/2005   right   SHOULDER SURGERY  12/15   left   TONGUE SURGERY  2012, 2013   WRIST SURGERY  1988   right, DeQuervains   Patient Active Problem List   Diagnosis Date Noted   Elevated ferritin level 04/05/2023   NASH (nonalcoholic steatohepatitis) 06/04/2020   Hypertriglyceridemia    Routine general medical examination at a health care facility 02/20/2020   Essential hypertension 02/20/2020   Osteopenia 08/05/2014   Breast cancer of upper-outer quadrant of left female breast (HCC) 07/20/2013   Leukoplakia of oral mucosa, including tongue 07/26/2012   Essential and other specified forms of tremor 07/26/2012   Drug-induced polyneuropathy (HCC) 07/26/2012    PCP: Etta Grandchild, MD  REFERRING PROVIDER: [name], MD  REFERRING DIAG: Diagnosis M54.16 (ICD-10-CM) - Lumbar radiculopathy  Rationale for Evaluation and Treatment: Rehabilitation  THERAPY DIAG:  Abnormal posture  Difficulty in walking, not elsewhere classified  Muscle weakness (generalized)  Radiculopathy, lumbosacral region  Acute low back pain with right-sided sciatica, unspecified back pain laterality  ONSET DATE: May 28th fall, Labor Day difficulty with stairs and right leg symptoms  SUBJECTIVE:  SUBJECTIVE STATEMENT: Jessica Mack notes no significant change since I saw her last Friday.  She did note more right gluteal symptoms with no change in peripheral symptoms.   On May 28th, 2024 Jessica Mack fell playing pickle ball.  She had an olecranon bursitis as a result and she ended up having the bursa removed.  She is typically very active with weights, walking, pickle ball and Pilates.  After her fall and elbow surgery, she noted difficulty with stairs (starting around Labor Day).  Jessica Mack has had 2  previous epidurals, 1st resulted in 40% improvement while the 2nd provided no relief.  She is scheduled to see Dr. Christell Constant on 08/09/2023.  PERTINENT HISTORY:  Left talus avascular necrosis, breast cancer, peripheral neuropathy, ankle, shoulder and wrist surgeries, left elbow olecranon bursectomy  PAIN:  Are you having pain? Yes: NPRS scale: 4-8/10 this week on a 10/10 Pain location: Right gluteals and lower extremity as distal as the lateral malleolus and anterior foot Pain description: Ache, burning, sharp Aggravating factors: Standing with or without support, lying prone Relieving factors: Heat, steroid dose pack  PRECAUTIONS: Back  RED FLAGS: None   WEIGHT BEARING RESTRICTIONS: No  FALLS:  Has patient fallen in last 6 months? Yes. Number of falls 1 with pickle ball that may have contributed to this episode  LIVING ENVIRONMENT: Lives with: lives with their family and lives with their spouse Lives in: House/apartment Stairs:  Can do one at a time with a rail Has following equipment at home: Environmental consultant - 4 wheeled and shower chair  OCCUPATION: Retired Tourist information centre manager, very physically active  PLOF: Independent  PATIENT GOALS: Get rid of leg pain, get rid of walker, return to walking, Pilates and pickle ball  NEXT MD VISIT: Dr. Christell Constant 10/14/2024IMPRESSION:  OBJECTIVE:  Note: Objective measures were completed at Evaluation unless otherwise noted.  DIAGNOSTIC FINDINGS:  1. Right paracentral disc protrusion at L4-L5 severely narrows the right lateral recess. 2. Severe right neural foraminal narrowing at L5-S1 secondary to a right foraminal disc protrusion. 3. Chronic nonspecific inward buckling of the anterior abdominal wall on the right. Recommend correlation with physical exam.  Complete view x-ray of the lumbar spine including AP, lateral,  flexion/extension views were ordered and reviewed by myself today.  X-rays  demonstrate multilevel spondylosis most notable at L4-L5 and  L3-L4.  There  is anterior spurring noted at these locations with some endplate sclerosis  between L4 and L5.  There is no retro-/anterograde listhesis with  flexion/extension views.  No acute compression fracture noted.  PATIENT SURVEYS:  FOTO 39 (Goal 62 in 14 visits)  SCREENING FOR RED FLAGS: Bowel or bladder incontinence: No Spinal tumors: No Cauda equina syndrome: No Compression fracture: No  COGNITION: Overall cognitive status: Within functional limits for tasks assessed     SENSATION: Jessica Mack notes right sided lower extremity symptoms as distal as the lateral malleolus  MUSCLE LENGTH: Hamstrings: Right 30 deg; Left 35 deg  POSTURE: rounded shoulders, forward head, and decreased lumbar lordosis   LUMBAR ROM:   AROM 07/29/2023  Flexion   Extension 0 (straight)  Right lateral flexion   Left lateral flexion   Right rotation   Left rotation    (Blank rows = not tested)  LOWER EXTREMITY ROM:     Passive  Left/Right 07/29/2023   Hip flexion 115/115   Hip extension    Hip abduction    Hip adduction    Hip internal rotation 10/14   Hip external rotation 20/33   Knee  flexion    Knee extension    Ankle dorsiflexion    Ankle plantarflexion    Ankle inversion    Ankle eversion     (Blank rows = not tested)  STRENGTH:  Deferred at exam secondary to time constraints  STRENGTH Left/Right 07/29/2023   Hip flexion    Hip extension    Hip abduction    Hip adduction    Hip internal rotation    Hip external rotation    Knee flexion    Knee extension    Ankle dorsiflexion    Ankle plantarflexion    Ankle inversion    Ankle eversion    Lumbar extension     (Blank rows = not tested)   GAIT: Distance walked: 30-40 feet Assistive device utilized: Walker - 4 wheeled Level of assistance: Complete Independence Comments: Flexed posture.  Was not using a walker before Labor Day.  TODAY'S TREATMENT:                                                                                                                               DATE:  08/04/2023 Lumbar traction 25-75# 3 steps up/down intermittent for 15 minutes (plus set-up and clean-up)  Shoulder blade pinches 5 x 5 seconds Lumbar extension AROM 5 and 3 x 3 seconds  Functional Activities: Reviewed education on disc pressures in various positions, traction education, practical log roll and the importance of avoiding flexion with rotation   08/02/23:  TherEx:  Nustep: x 5 minutes UE/LE level 5 Supine: trunk rotation x 5 holding 30 seconds Supine marching x 20 alternating Sidelying clam shells x 15 on Rt Scapular retraction: x 10 holding 5 sec Manual:  Gentle hip distraction in supine with hips slightly  Mechanical Traction:  Lumbar Protocol: intermittent traction: max pull 80#, min pull 55# for 20 minutes   07/30/2023 Lumbar traction 25-75# 3 steps up/down intermittent for 15 minutes (plus set-up and clean-up)  Shoulder blade pinches 5 x 5 seconds Lumbar extension AROM 5 and 3 x 3 seconds  Functional Activities: Reviewed education on disc pressures in various positions, traction education, practical log roll and the importance of avoiding flexion with rotation   PATIENT EDUCATION:  Education details: See above Person educated: Patient Education method: Explanation, Demonstration, Tactile cues, Verbal cues, and Handouts Education comprehension: verbalized understanding, returned demonstration, verbal cues required, tactile cues required, and needs further education  HOME EXERCISE PROGRAM: [id number]  ASSESSMENT:  CLINICAL IMPRESSION: Jessica Mack is doing what she can with her HEP.  She is relying on pain medication for daily function.  Her endurance with PT is very limited and we are looking forward to her appointment with Dr. Christell Constant next Monday.  Our current focus is on reducing peripheral symptoms and body mechanics.  OBJECTIVE IMPAIRMENTS: Abnormal gait, decreased activity tolerance, decreased  endurance, difficulty walking, decreased ROM, decreased strength, impaired perceived functional ability, increased muscle spasms, impaired flexibility, improper body mechanics, postural dysfunction, and pain.  ACTIVITY LIMITATIONS: carrying, lifting, standing, stairs, bed mobility, and locomotion level  PARTICIPATION LIMITATIONS: community activity  PERSONAL FACTORS: Left talus avascular necrosis, breast cancer, peripheral neuropathy, ankle, shoulder and wrist surgeries, left elbow olecranon bursectomy are also affecting patient's functional outcome.   REHAB POTENTIAL: Fair we will focus on trying to reduce her disc herniations including use of lumbar traction.  Reassessment will be completed in 2 weeks before her follow-up with Dr. Christell Constant to help him make additional recommendations.  CLINICAL DECISION MAKING: Stable/uncomplicated  EVALUATION COMPLEXITY: Low   GOALS: Goals reviewed with patient? Yes  SHORT TERM GOALS: Target date: 08/26/2023  Jessica Mack will be independent with her day 1 home exercise program Baseline: Started 07/29/2023 Goal status: Met 08/04/2023  2.  Improve standing lumbar extension AROM to at least 10 degrees Baseline: Neutral or 0 degrees Goal status: INITIAL  3.  Jessica Mack will be able to stand or walk for 5 minutes or more without being stopped by right sided radicular symptoms Baseline: Unable to stand or walk comfortably for more than a minute at best Goal status: Ongoing 08/04/2023   LONG TERM GOALS: Target date: 09/23/2023  Improve FOTO to 62 in 14 visits Baseline: 39 Goal status: INITIAL  2.  Jessica Mack will report right lower extremity pain consistently 0-2/10 on the numeric pain rating scale Baseline: 3-8/10 Goal status: INITIAL  3.  Jessica Mack will be able to stand and walk for 20 minutes or more without being stopped by right sided lower extremity radicular symptoms Baseline: A minute or less Goal status: INITIAL  4.  Jessica Mack will be able to return to walking for  exercise without the use of her walker Baseline: Using a walker at evaluation Goal status: INITIAL  5.  Jessica Mack will be independent with her long-term maintenance home exercise program at discharge Baseline: Started 07/29/2023 Goal status: INITIAL   PLAN:  PT FREQUENCY: 3x/week  PT DURATION: 4 weeks  PLANNED INTERVENTIONS: Therapeutic exercises, Therapeutic activity, Neuromuscular re-education, Balance training, Gait training, Patient/Family education, Self Care, Dry Needling, Cryotherapy, Moist heat, Traction, and Manual therapy.  PLAN FOR NEXT SESSION: Review home exercise program and continue lumbar traction along with anything else that can reduce the disc and her right sided peripheral symptoms.  Sees Dr. Christell Constant on 08/09/2023.   Cherlyn Cushing, PT, MPT 08/04/2023, 12:45 PM

## 2023-08-06 ENCOUNTER — Other Ambulatory Visit: Payer: Self-pay | Admitting: Sports Medicine

## 2023-08-06 ENCOUNTER — Ambulatory Visit: Payer: PPO | Admitting: Physical Therapy

## 2023-08-06 ENCOUNTER — Encounter: Payer: Self-pay | Admitting: Physical Therapy

## 2023-08-06 DIAGNOSIS — R262 Difficulty in walking, not elsewhere classified: Secondary | ICD-10-CM

## 2023-08-06 DIAGNOSIS — M6281 Muscle weakness (generalized): Secondary | ICD-10-CM

## 2023-08-06 DIAGNOSIS — R293 Abnormal posture: Secondary | ICD-10-CM | POA: Diagnosis not present

## 2023-08-06 NOTE — Therapy (Signed)
OUTPATIENT PHYSICAL THERAPY THORACOLUMBAR TREATMENT   Patient Name: Jessica Mack MRN: [id number] DOB:02-Jun-1956, 68 y.o., female Today's Date: 08/09/2023  END OF SESSION:  PT End of Session - 08/09/23 1259     Visit Number 6    Number of Visits 14    Date for PT Re-Evaluation 09/23/23    Progress Note Due on Visit 10    PT Start Time 1300    PT Stop Time 1346    PT Time Calculation (min) 46 min    Activity Tolerance Patient limited by pain;Patient tolerated treatment well;No increased pain    Behavior During Therapy [facility] for tasks assessed/performed               Past Medical History:  Diagnosis Date   Avascular necrosis of bone (HCC)    Left talus   Blood transfusion    Breast cancer (HCC) 2005   left   Cancer (HCC)    Dysplastic nevus 1988   right hip   Fatty liver    GERD (gastroesophageal reflux disease)    History of breast cancer 2005   invasive ductal cancer left breast   Hypertension    Hypertriglyceridemia    Irritable bowel syndrome    Metabolic syndrome    resolved with diet and exercise   NASH (nonalcoholic steatohepatitis)    Nocturnal leg cramps 02/14/2015   Osteopenia    Palpitations 09/2012   normal echo   Peripheral neuropathy    Personal history of chemotherapy    Personal history of radiation therapy    Plantar fasciitis 03/2003   PONV (postoperative nausea and vomiting)    Tongue dysplasia    Ulcer    Past Surgical History:  Procedure Laterality Date   ANKLE SURGERY  07/2007   left, revascularization   BREAST LUMPECTOMY  06/2004   left, with port placement   CESAREAN SECTION  1991, 1993   x2   COLONOSCOPY  09/2013   Dr. Loreta Ave   DG GALL BLADDER     IRRIGATION AND DEBRIDEMENT ELBOW Left 05/04/2023   Procedure: LEFT ELBOW IRRIGATION AND DEBRIDEMENT;  Surgeon: [name], MD;  Location: [facility];  Service: Orthopedics;  Laterality: Left;   LAPAROSCOPIC CHOLECYSTECTOMY  2003   MOUTH SURGERY  11/15    implant/crown placed 4/16   SHOULDER ARTHROSCOPY  10/2005   right   SHOULDER SURGERY  12/15   left   TONGUE SURGERY  2012, 2013   WRIST SURGERY  1988   right, DeQuervains   Patient Active Problem List   Diagnosis Date Noted   Elevated ferritin level 04/05/2023   NASH (nonalcoholic steatohepatitis) 06/04/2020   Hypertriglyceridemia    Routine general medical examination at a health care facility 02/20/2020   Essential hypertension 02/20/2020   Osteopenia 08/05/2014   Breast cancer of upper-outer quadrant of left female breast (HCC) 07/20/2013   Leukoplakia of oral mucosa, including tongue 07/26/2012   Essential and other specified forms of tremor 07/26/2012   Drug-induced polyneuropathy (HCC) 07/26/2012    PCP: Etta Grandchild, MD  REFERRING PROVIDER: [name], MD  REFERRING DIAG: Diagnosis M54.16 (ICD-10-CM) - Lumbar radiculopathy  Rationale for Evaluation and Treatment: Rehabilitation  THERAPY DIAG:  Abnormal posture  Difficulty in walking, not elsewhere classified  Muscle weakness (generalized)  ONSET DATE: May 28th fall, Labor Day difficulty with stairs and right leg symptoms  SUBJECTIVE:  SUBJECTIVE STATEMENT: A bit more pain today, has been fairly consistent since the morning, 8/10. Denies any issues after last session. Notes new exercises to HEP were more tolerable but still having high degree of pain overall. Still having most of her pain with extension, flexed positions are more relieving per pt.     Per eval - On May 28th, 2024 Jessica Mack fell playing pickle ball.  She had an olecranon bursitis as a result and she ended up having the bursa removed.  She is typically very active with weights, walking, pickle ball and Pilates.  After her fall and elbow surgery, she noted difficulty  with stairs (starting around Labor Day).  Jessica Mack has had 2 previous epidurals, 1st resulted in 40% improvement while the 2nd provided no relief.  She is scheduled to see Dr. Christell Constant on 08/09/2023.  PERTINENT HISTORY:  Left talus avascular necrosis, breast cancer, peripheral neuropathy, ankle, shoulder and wrist surgeries, left elbow olecranon bursectomy  PAIN:  Are you having pain? Yes: NPRS scale: 4-8/10 this week on a 10/10 Pain location: Right gluteals and lower extremity as distal as the lateral malleolus and anterior foot Pain description: Ache, burning, sharp Aggravating factors: Standing with or without support, lying prone Relieving factors: Heat, steroid dose pack  PRECAUTIONS: Back  RED FLAGS: None   WEIGHT BEARING RESTRICTIONS: No  FALLS:  Has patient fallen in last 6 months? Yes. Number of falls 1 with pickle ball that may have contributed to this episode  LIVING ENVIRONMENT: Lives with: lives with their family and lives with their spouse Lives in: House/apartment Stairs:  Can do one at a time with a rail Has following equipment at home: Dan Humphreys - 4 wheeled and shower chair  OCCUPATION: Retired [employer] information centre manager, very physically active  PLOF: Independent  PATIENT GOALS: Get rid of leg pain, get rid of walker, return to walking, Pilates and pickle ball  NEXT MD VISIT: Dr. Christell Constant 08/09/2023  OBJECTIVE:  Note: Objective measures were completed at Evaluation unless otherwise noted.  DIAGNOSTIC FINDINGS:  1. Right paracentral disc protrusion at L4-L5 severely narrows the right lateral recess. 2. Severe right neural foraminal narrowing at L5-S1 secondary to a right foraminal disc protrusion. 3. Chronic nonspecific inward buckling of the anterior abdominal wall on the right. Recommend correlation with physical exam.  Complete view x-ray of the lumbar spine including AP, lateral,  flexion/extension views were ordered and reviewed by myself today.  X-rays  demonstrate  multilevel spondylosis most notable at L4-L5 and L3-L4.  There  is anterior spurring noted at these locations with some endplate sclerosis  between L4 and L5.  There is no retro-/anterograde listhesis with  flexion/extension views.  No acute compression fracture noted.  PATIENT SURVEYS:  FOTO 39 (Goal 62 in 14 visits) FOTO 27 08/06/23  SCREENING FOR RED FLAGS: Bowel or bladder incontinence: No Spinal tumors: No Cauda equina syndrome: No Compression fracture: No  COGNITION: Overall cognitive status: Within functional limits for tasks assessed     SENSATION: Jessica Mack notes right sided lower extremity symptoms as distal as the lateral malleolus  MUSCLE LENGTH: Hamstrings: Right 30 deg; Left 35 deg  POSTURE: rounded shoulders, forward head, and decreased lumbar lordosis   LUMBAR ROM:   AROM 07/29/2023 08/06/23  Flexion    Extension 0 (straight) 5 deg w/ pain  Right lateral flexion    Left lateral flexion    Right rotation    Left rotation     (Blank rows = not tested)  LOWER EXTREMITY ROM:  Passive  Left/Right 07/29/2023   Hip flexion 115/115   Hip extension    Hip abduction    Hip adduction    Hip internal rotation 10/14   Hip external rotation 20/33   Knee flexion    Knee extension    Ankle dorsiflexion    Ankle plantarflexion    Ankle inversion    Ankle eversion     (Blank rows = not tested)  STRENGTH:  Deferred at exam secondary to time constraints  STRENGTH Left/Right 07/29/2023   Hip flexion    Hip extension    Hip abduction    Hip adduction    Hip internal rotation    Hip external rotation    Knee flexion    Knee extension    Ankle dorsiflexion    Ankle plantarflexion    Ankle inversion    Ankle eversion    Lumbar extension     (Blank rows = not tested)   GAIT: Distance walked: 30-40 feet Assistive device utilized: Walker - 4 wheeled Level of assistance: Complete Independence Comments: Flexed posture.  Was not using a walker before Labor  Day.  TODAY'S TREATMENT:                                                                                                                              [facility] Adult PT Treatment:                                                DATE: 08/09/23 Therapeutic Exercise: Seated swiss ball press down, core iso, 2x5 cues for breath control and appropriate alignment/setup Seated adductor isometric 2x8 cues for breath control Extended rest breaks with therex given symptom irritability HEP update + education  Therapeutic Activity: Significant time spent w/ education/discussion re: symptom behavior as it pertains to activity tolerance and functional mobility, comfort with positioning, use of spine model to discuss relevant anatomy/physiology. Education/discussion re: goals of PT and approach going forward pending her visit with provider today   [facility] Adult PT Treatment:                                                DATE: 08/06/23 Therapeutic Exercise: Seated pelvic tilts w/ thoracic support 2x8  cues for breath control and comfortable ROM Modified nerve glides RLE (heel/toe raises) x6 discontinued due to nerve Seated adduction isometrics 2x5 w/ cues for breath control, comfortable contraction HEP update + education, time spent w/ education on rationale for interventions, positioning for comfort w/ exercises; requires extended rest breaks with exercises as above given symptom irritability  Therapeutic Activity: Goals assessment, education/discussion re: PT goals/POC FOTO + education Significant time spent discussing symptom behavior as  it pertains to functional mobility and activity tolerance, relevant anatomy/physiology and symptom modification strategies as it pertains to activity tolerance, frequent/short bouts of ambulation w/ AD during day to preserve mobility   DATE:  08/04/2023 Lumbar traction 25-75# 3 steps up/down intermittent for 15 minutes (plus set-up and clean-up)  Shoulder blade pinches 5 x 5  seconds Lumbar extension AROM 5 and 3 x 3 seconds  Functional Activities: Reviewed education on disc pressures in various positions, traction education, practical log roll and the importance of avoiding flexion with rotation   08/02/23:  TherEx:  Nustep: x 5 minutes UE/LE level 5 Supine: trunk rotation x 5 holding 30 seconds Supine marching x 20 alternating Sidelying clam shells x 15 on Rt Scapular retraction: x 10 holding 5 sec Manual:  Gentle hip distraction in supine with hips slightly  Mechanical Traction:  Lumbar Protocol: intermittent traction: max pull 80#, min pull 55# for 20 minutes   07/30/2023 Lumbar traction 25-75# 3 steps up/down intermittent for 15 minutes (plus set-up and clean-up)  Shoulder blade pinches 5 x 5 seconds Lumbar extension AROM 5 and 3 x 3 seconds  Functional Activities: Reviewed education on disc pressures in various positions, traction education, practical log roll and the importance of avoiding flexion with rotation   PATIENT EDUCATION:  Education details: See above Person educated: Patient Education method: Explanation, Demonstration, Tactile cues, Verbal cues, and Handouts Education comprehension: verbalized understanding, returned demonstration, verbal cues required, tactile cues required, and needs further education  HOME EXERCISE PROGRAM: Access Code: [email or web address] URL: [email or web address] Date: 08/09/2023 Prepared by: Fransisco Hertz  Program Notes - please perform seated press downs as performed in clinic, 2-3 times per day, 5 repetitions at a time. make sure not to hold breath or lean forward. can use ball or rolled up pillow  Exercises - Standing Scapular Retraction  - 5 x daily - 7 x weekly - 1 sets - 5 reps - 5 second hold - Seated Hip Adduction Isometrics with Ball  - 5 x daily - 7 x weekly - 1 sets - 5 reps - Seated Pelvic Tilt  - 5 x daily - 7 x weekly - 1 sets - 5 reps  ASSESSMENT:  CLINICAL IMPRESSION: Pt  arrived w/ 8/10 pain on NPS today, reported symptoms more irritable since this morning but cannot recall specific precipitating factors. Noted HEP update was more tolerable over the weekend, continuing to have reduced tolerance to extension and reported relief with flexed positions. Exercise tolerance reduced today which is likely attributable in part to increased symptoms on arrival, tendency towards transient aggravation of symptoms with isometric activity emphasizing comfortable breath control and posture, although symptoms settled to baseline with rest. Removed extension from HEP given pt report of consistent aggravation with this, updated to include new isometric exercise. Given high symptom irritability to date with PT and pt report of worsening symptoms as time goes on, recommend assessing appropriateness of continued PT going forward after her follow up with provider today. If pt and provider agreeable to continue with PT, anticipate pt may benefit from approach focusing on gentle movements within tolerance and core stability/isometrics with gradual progression as able. No adverse events, pt departs in no acute distress w/ continued high pain levels.   OBJECTIVE IMPAIRMENTS: Abnormal gait, decreased activity tolerance, decreased endurance, difficulty walking, decreased ROM, decreased strength, impaired perceived functional ability, increased muscle spasms, impaired flexibility, improper body mechanics, postural dysfunction, and pain.   ACTIVITY LIMITATIONS: carrying, lifting, standing, stairs, bed mobility, and locomotion level  PARTICIPATION LIMITATIONS: community activity  PERSONAL FACTORS: Left talus avascular necrosis, breast cancer, peripheral neuropathy, ankle, shoulder and wrist surgeries, left elbow olecranon bursectomy are also affecting patient's functional outcome.   REHAB POTENTIAL: Fair we will focus on trying to reduce her disc herniations including use of lumbar traction.   Reassessment will be completed in 2 weeks before her follow-up with Dr. Christell Constant to help him make additional recommendations.  CLINICAL DECISION MAKING: Stable/uncomplicated  EVALUATION COMPLEXITY: Low   GOALS: Goals reviewed with patient? Yes  SHORT TERM GOALS: Target date: 08/26/2023  Jessica Mack will be independent with her day 1 home exercise program Baseline: Started 07/29/2023 Goal status: Met 08/04/2023  2.  Improve standing lumbar extension AROM to at least 10 degrees Baseline: Neutral or 0 degrees 08/06/23: 5 degrees with pain Goal status: ONGOING   3.  Jessica Mack will be able to stand or walk for 5 minutes or more without being stopped by right sided radicular symptoms Baseline: Unable to stand or walk comfortably for more than a minute at best 08/06/23: per pt report, ~10 min with AD, unsure without AD (has not tried due to fear of falling) Goal status: ONGOING    LONG TERM GOALS: Target date: 09/23/2023 Improve FOTO to 62 in 14 visits Baseline: 39 08/06/23: 27 Goal status: ONGOING   2.  Jessica Mack will report right lower extremity pain consistently 0-2/10 on the numeric pain rating scale Baseline: 3-8/10 08/06/23: 3-9/10 Goal status: ONGOING   3.  Jessica Mack will be able to stand and walk for 20 minutes or more without being stopped by right sided lower extremity radicular symptoms Baseline: A minute or less 08/06/23: ~44min with AD Goal status: ONGOING   4.  Jessica Mack will be able to return to walking for exercise without the use of her walker Baseline: Using a walker at evaluation 08/06/23: using rollator  Goal status: ONGOING   5.  Jessica Mack will be independent with her long-term maintenance home exercise program at discharge Baseline: Started 07/29/2023 08/06/23: reports performance of HEP as symptoms allow Goal status: ONGOING    PLAN:  PT FREQUENCY: 3x/week  PT DURATION: 4 weeks  PLANNED INTERVENTIONS: Therapeutic exercises, Therapeutic activity, Neuromuscular re-education,  Balance training, Gait training, Patient/Family education, Self Care, Dry Needling, Cryotherapy, Moist heat, Traction, and Manual therapy.  PLAN FOR NEXT SESSION: symptom modification strategies as indicated/appropriate. Recommend trial of approach focusing on gentle core activation / lumbopelvic stability within symptom tolerance, graded progression as able. Check in following visit w/ Dr. Christell Constant. Would recommend limiting extension for now as it remains consistently provocative per pt report.     Ashley Murrain PT, DPT 08/09/2023 2:50 PM

## 2023-08-06 NOTE — Therapy (Signed)
OUTPATIENT PHYSICAL THERAPY THORACOLUMBAR TREATMENT   Patient Name: BILLIE VITACCO MRN: [id number] DOB:24-Mar-1956, 67 y.o., female Today's Date: 08/06/2023  END OF SESSION:  PT End of Session - 08/06/23 1257     Visit Number 5    Number of Visits 14    Date for PT Re-Evaluation 09/23/23    Progress Note Due on Visit 10    PT Start Time 1300    PT Stop Time 1402    PT Time Calculation (min) 62 min    Activity Tolerance Patient limited by pain;Patient tolerated treatment well;No increased pain    Behavior During Therapy [facility] for tasks assessed/performed              Past Medical History:  Diagnosis Date   Avascular necrosis of bone (HCC)    Left talus   Blood transfusion    Breast cancer (HCC) 2005   left   Cancer (HCC)    Dysplastic nevus 1988   right hip   Fatty liver    GERD (gastroesophageal reflux disease)    History of breast cancer 2005   invasive ductal cancer left breast   Hypertension    Hypertriglyceridemia    Irritable bowel syndrome    Metabolic syndrome    resolved with diet and exercise   NASH (nonalcoholic steatohepatitis)    Nocturnal leg cramps 02/14/2015   Osteopenia    Palpitations 09/2012   normal echo   Peripheral neuropathy    Personal history of chemotherapy    Personal history of radiation therapy    Plantar fasciitis 03/2003   PONV (postoperative nausea and vomiting)    Tongue dysplasia    Ulcer    Past Surgical History:  Procedure Laterality Date   ANKLE SURGERY  07/2007   left, revascularization   BREAST LUMPECTOMY  06/2004   left, with port placement   CESAREAN SECTION  1991, 1993   x2   COLONOSCOPY  09/2013   Dr. Loreta Ave   DG GALL BLADDER     IRRIGATION AND DEBRIDEMENT ELBOW Left 05/04/2023   Procedure: LEFT ELBOW IRRIGATION AND DEBRIDEMENT;  Surgeon: [name], MD;  Location: [facility];  Service: Orthopedics;  Laterality: Left;   LAPAROSCOPIC CHOLECYSTECTOMY  2003   MOUTH SURGERY  11/15    implant/crown placed 4/16   SHOULDER ARTHROSCOPY  10/2005   right   SHOULDER SURGERY  12/15   left   TONGUE SURGERY  2012, 2013   WRIST SURGERY  1988   right, DeQuervains   Patient Active Problem List   Diagnosis Date Noted   Elevated ferritin level 04/05/2023   NASH (nonalcoholic steatohepatitis) 06/04/2020   Hypertriglyceridemia    Routine general medical examination at a health care facility 02/20/2020   Essential hypertension 02/20/2020   Osteopenia 08/05/2014   Breast cancer of upper-outer quadrant of left female breast (HCC) 07/20/2013   Leukoplakia of oral mucosa, including tongue 07/26/2012   Essential and other specified forms of tremor 07/26/2012   Drug-induced polyneuropathy (HCC) 07/26/2012    PCP: Etta Grandchild, MD  REFERRING PROVIDER: [name], MD  REFERRING DIAG: Diagnosis M54.16 (ICD-10-CM) - Lumbar radiculopathy  Rationale for Evaluation and Treatment: Rehabilitation  THERAPY DIAG:  Abnormal posture  Difficulty in walking, not elsewhere classified  Muscle weakness (generalized)  ONSET DATE: May 28th fall, Labor Day difficulty with stairs and right leg symptoms  SUBJECTIVE:  SUBJECTIVE STATEMENT: Pt arrives w/ 3-4/10 pain today mostly in R hip/thigh. States symptoms seem to be worsening over time and she would like to defer traction - states it provides some transient relief during but she tends to have several hours of increased pain afterwards. Looking forward to visit with Dr. Christell Constant on Monday    Per eval - On May 28th, 2024 Wyndi fell playing pickle ball.  She had an olecranon bursitis as a result and she ended up having the bursa removed.  She is typically very active with weights, walking, pickle ball and Pilates.  After her fall and elbow surgery, she noted  difficulty with stairs (starting around Labor Day).  Alaze has had 2 previous epidurals, 1st resulted in 40% improvement while the 2nd provided no relief.  She is scheduled to see Dr. Christell Constant on 08/09/2023.  PERTINENT HISTORY:  Left talus avascular necrosis, breast cancer, peripheral neuropathy, ankle, shoulder and wrist surgeries, left elbow olecranon bursectomy  PAIN:  Are you having pain? Yes: NPRS scale: 4-8/10 this week on a 10/10 Pain location: Right gluteals and lower extremity as distal as the lateral malleolus and anterior foot Pain description: Ache, burning, sharp Aggravating factors: Standing with or without support, lying prone Relieving factors: Heat, steroid dose pack  PRECAUTIONS: Back  RED FLAGS: None   WEIGHT BEARING RESTRICTIONS: No  FALLS:  Has patient fallen in last 6 months? Yes. Number of falls 1 with pickle ball that may have contributed to this episode  LIVING ENVIRONMENT: Lives with: lives with their family and lives with their spouse Lives in: House/apartment Stairs:  Can do one at a time with a rail Has following equipment at home: Dan Humphreys - 4 wheeled and shower chair  OCCUPATION: Retired [employer] information centre manager, very physically active  PLOF: Independent  PATIENT GOALS: Get rid of leg pain, get rid of walker, return to walking, Pilates and pickle ball  NEXT MD VISIT: Dr. Christell Constant 08/09/2023  OBJECTIVE:  Note: Objective measures were completed at Evaluation unless otherwise noted.  DIAGNOSTIC FINDINGS:  1. Right paracentral disc protrusion at L4-L5 severely narrows the right lateral recess. 2. Severe right neural foraminal narrowing at L5-S1 secondary to a right foraminal disc protrusion. 3. Chronic nonspecific inward buckling of the anterior abdominal wall on the right. Recommend correlation with physical exam.  Complete view x-ray of the lumbar spine including AP, lateral,  flexion/extension views were ordered and reviewed by myself today.  X-rays   demonstrate multilevel spondylosis most notable at L4-L5 and L3-L4.  There  is anterior spurring noted at these locations with some endplate sclerosis  between L4 and L5.  There is no retro-/anterograde listhesis with  flexion/extension views.  No acute compression fracture noted.  PATIENT SURVEYS:  FOTO 39 (Goal 62 in 14 visits) FOTO 27 08/06/23  SCREENING FOR RED FLAGS: Bowel or bladder incontinence: No Spinal tumors: No Cauda equina syndrome: No Compression fracture: No  COGNITION: Overall cognitive status: Within functional limits for tasks assessed     SENSATION: Golie notes right sided lower extremity symptoms as distal as the lateral malleolus  MUSCLE LENGTH: Hamstrings: Right 30 deg; Left 35 deg  POSTURE: rounded shoulders, forward head, and decreased lumbar lordosis   LUMBAR ROM:   AROM 07/29/2023 08/06/23  Flexion    Extension 0 (straight) 5 deg w/ pain  Right lateral flexion    Left lateral flexion    Right rotation    Left rotation     (Blank rows = not tested)  LOWER  EXTREMITY ROM:     Passive  Left/Right 07/29/2023   Hip flexion 115/115   Hip extension    Hip abduction    Hip adduction    Hip internal rotation 10/14   Hip external rotation 20/33   Knee flexion    Knee extension    Ankle dorsiflexion    Ankle plantarflexion    Ankle inversion    Ankle eversion     (Blank rows = not tested)  STRENGTH:  Deferred at exam secondary to time constraints  STRENGTH Left/Right 07/29/2023   Hip flexion    Hip extension    Hip abduction    Hip adduction    Hip internal rotation    Hip external rotation    Knee flexion    Knee extension    Ankle dorsiflexion    Ankle plantarflexion    Ankle inversion    Ankle eversion    Lumbar extension     (Blank rows = not tested)   GAIT: Distance walked: 30-40 feet Assistive device utilized: Walker - 4 wheeled Level of assistance: Complete Independence Comments: Flexed posture.  Was not using a walker  before Labor Day.  TODAY'S TREATMENT:                                                                                                                              [facility] Adult PT Treatment:                                                DATE: 08/06/23 Therapeutic Exercise: Seated pelvic tilts w/ thoracic support 2x8  cues for breath control and comfortable ROM Modified nerve glides RLE (heel/toe raises) x6 discontinued due to nerve Seated adduction isometrics 2x5 w/ cues for breath control, comfortable contraction HEP update + education, time spent w/ education on rationale for interventions, positioning for comfort w/ exercises; requires extended rest breaks with exercises as above given symptom irritability  Therapeutic Activity: Goals assessment, education/discussion re: PT goals/POC FOTO + education Significant time spent discussing symptom behavior as it pertains to functional mobility and activity tolerance, relevant anatomy/physiology and symptom modification strategies as it pertains to activity tolerance, frequent/short bouts of ambulation w/ AD during day to preserve mobility   DATE:  08/04/2023 Lumbar traction 25-75# 3 steps up/down intermittent for 15 minutes (plus set-up and clean-up)  Shoulder blade pinches 5 x 5 seconds Lumbar extension AROM 5 and 3 x 3 seconds  Functional Activities: Reviewed education on disc pressures in various positions, traction education, practical log roll and the importance of avoiding flexion with rotation   08/02/23:  TherEx:  Nustep: x 5 minutes UE/LE level 5 Supine: trunk rotation x 5 holding 30 seconds Supine marching x 20 alternating Sidelying clam shells x 15 on Rt Scapular retraction: x 10 holding 5 sec Manual:  Gentle hip distraction in supine with hips slightly  Mechanical Traction:  Lumbar Protocol: intermittent traction: max pull 80#, min pull 55# for 20 minutes   07/30/2023 Lumbar traction 25-75# 3 steps up/down intermittent  for 15 minutes (plus set-up and clean-up)  Shoulder blade pinches 5 x 5 seconds Lumbar extension AROM 5 and 3 x 3 seconds  Functional Activities: Reviewed education on disc pressures in various positions, traction education, practical log roll and the importance of avoiding flexion with rotation   PATIENT EDUCATION:  Education details: See above Person educated: Patient Education method: Explanation, Demonstration, Tactile cues, Verbal cues, and Handouts Education comprehension: verbalized understanding, returned demonstration, verbal cues required, tactile cues required, and needs further education  HOME EXERCISE PROGRAM: [id number]  ASSESSMENT:  CLINICAL IMPRESSION: Pt arrives w/ report of 3-4/10 pain, slowly worsening symptoms. Today we spend time re-assessing goals given her follow up with Dr. Christell Constant next week, please see goals section below. Deferring traction on this date as it does not seem to have provided symptom modification and pt states it tends to make symptoms worse for several hours after sessions. We spend a great deal of today's session w/ education/discussion re: relevant anatomy/physiology as it pertains to interventions and activity tolerance, modifying activities for comfort with performance. Also continuing to encourage frequent if short bouts of ambulation throughout day to preserve mobility. Her symptoms remain irritable overall with exercises during today's session but tend to settle to baseline with extended rest. No adverse events, HEP is updated to include gentle lumbopelvic stability exercises with aim of slowly improving tolerance to basic movements. For now recommend continuing along current plan of care, assessing progress over next few visits and checking in after her follow up with Dr. Christell Constant next week. Pt departs today's session in no acute distress, all voiced questions/concerns addressed appropriately from PT perspective.    OBJECTIVE IMPAIRMENTS: Abnormal  gait, decreased activity tolerance, decreased endurance, difficulty walking, decreased ROM, decreased strength, impaired perceived functional ability, increased muscle spasms, impaired flexibility, improper body mechanics, postural dysfunction, and pain.   ACTIVITY LIMITATIONS: carrying, lifting, standing, stairs, bed mobility, and locomotion level  PARTICIPATION LIMITATIONS: community activity  PERSONAL FACTORS: Left talus avascular necrosis, breast cancer, peripheral neuropathy, ankle, shoulder and wrist surgeries, left elbow olecranon bursectomy are also affecting patient's functional outcome.   REHAB POTENTIAL: Fair we will focus on trying to reduce her disc herniations including use of lumbar traction.  Reassessment will be completed in 2 weeks before her follow-up with Dr. Christell Constant to help him make additional recommendations.  CLINICAL DECISION MAKING: Stable/uncomplicated  EVALUATION COMPLEXITY: Low   GOALS: Goals reviewed with patient? Yes  SHORT TERM GOALS: Target date: 08/26/2023  Dalarie will be independent with her day 1 home exercise program Baseline: Started 07/29/2023 Goal status: Met 08/04/2023  2.  Improve standing lumbar extension AROM to at least 10 degrees Baseline: Neutral or 0 degrees 08/06/23: 5 degrees with pain Goal status: ONGOING   3.  Chequita will be able to stand or walk for 5 minutes or more without being stopped by right sided radicular symptoms Baseline: Unable to stand or walk comfortably for more than a minute at best 08/06/23: per pt report, ~10 min with AD, unsure without AD (has not tried due to fear of falling) Goal status: ONGOING    LONG TERM GOALS: Target date: 09/23/2023 Improve FOTO to 62 in 14 visits Baseline: 39 08/06/23: 27 Goal status: ONGOING   2.  Rosella will report right lower extremity pain consistently  0-2/10 on the numeric pain rating scale Baseline: 3-8/10 08/06/23: 3-9/10 Goal status: ONGOING   3.  Ranessa will be able to stand and  walk for 20 minutes or more without being stopped by right sided lower extremity radicular symptoms Baseline: A minute or less 08/06/23: ~55min with AD Goal status: ONGOING   4.  Vicki will be able to return to walking for exercise without the use of her walker Baseline: Using a walker at evaluation 08/06/23: using rollator  Goal status: ONGOING   5.  Diamonique will be independent with her long-term maintenance home exercise program at discharge Baseline: Started 07/29/2023 08/06/23: reports performance of HEP as symptoms allow Goal status: ONGOING    PLAN:  PT FREQUENCY: 3x/week  PT DURATION: 4 weeks  PLANNED INTERVENTIONS: Therapeutic exercises, Therapeutic activity, Neuromuscular re-education, Balance training, Gait training, Patient/Family education, Self Care, Dry Needling, Cryotherapy, Moist heat, Traction, and Manual therapy.  PLAN FOR NEXT SESSION: symptom modification strategies as indicated/appropriate. Recommend trial of approach focusing on gentle core activation / lumbopelvic stability within symptom tolerance, graded progression as able. Check in following visit w/ Dr. Christell Constant.    Ashley Murrain PT, DPT 08/06/2023 2:20 PM

## 2023-08-09 ENCOUNTER — Ambulatory Visit: Payer: PPO | Admitting: Orthopedic Surgery

## 2023-08-09 ENCOUNTER — Encounter: Payer: Self-pay | Admitting: Physical Therapy

## 2023-08-09 ENCOUNTER — Ambulatory Visit: Payer: PPO | Admitting: Physical Therapy

## 2023-08-09 ENCOUNTER — Other Ambulatory Visit (HOSPITAL_COMMUNITY): Payer: Self-pay

## 2023-08-09 ENCOUNTER — Encounter: Payer: Self-pay | Admitting: Orthopedic Surgery

## 2023-08-09 VITALS — BP 133/87 | HR 83 | Ht 62.0 in | Wt 145.0 lb

## 2023-08-09 DIAGNOSIS — R293 Abnormal posture: Secondary | ICD-10-CM

## 2023-08-09 DIAGNOSIS — M6281 Muscle weakness (generalized): Secondary | ICD-10-CM

## 2023-08-09 DIAGNOSIS — R262 Difficulty in walking, not elsewhere classified: Secondary | ICD-10-CM

## 2023-08-09 DIAGNOSIS — M5116 Intervertebral disc disorders with radiculopathy, lumbar region: Secondary | ICD-10-CM

## 2023-08-09 DIAGNOSIS — M5416 Radiculopathy, lumbar region: Secondary | ICD-10-CM | POA: Diagnosis not present

## 2023-08-09 MED ORDER — GABAPENTIN 600 MG PO TABS
600.0000 mg | ORAL_TABLET | Freq: Three times a day (TID) | ORAL | 0 refills | Status: DC
Start: 2023-08-09 — End: 2023-09-06

## 2023-08-09 MED ORDER — HYDROCODONE-ACETAMINOPHEN 5-325 MG PO TABS
1.0000 | ORAL_TABLET | ORAL | 0 refills | Status: AC | PRN
Start: 2023-08-09 — End: 2023-08-14

## 2023-08-09 NOTE — Progress Notes (Signed)
Orthopedic Spine Surgery Office Note  Assessment: Patient is a 67 y.o. female with radiating right lower extremity pain.  Has a right paracentral disc herniation at L4/5 and right foraminal stenosis at L5/S1, causing L5 radiculopathy   Plan: -Patient has tried PT, Tylenol, oral steroids, ibuprofen, gabapentin, lumbar steroid injection, oxycodone -Patient has tried conservative treatments for over 6 weeks now and is still having severe pain that is limiting her ability to do even simple things like walking her dog, so discussed surgical treatment as an option for her.  After our discussion, patient elected to proceed -Patient will next be seen a date of surgery   Patient expressed understanding of the plan and all questions were answered to the patient's satisfaction.   The patient has developed symptoms of lumbar radiculopathy.  Her symptoms have failed to improve with conservative treatment, so operative management was presented as an option to her.  She has a large right paracentral disc herniation causing lateral recess stenosis at L4/5 and right foraminal stenosis at L5/S1.  Accordingly, would plan to do a L4/5 hemilaminotomy with microdiscectomy and L5/S1 foraminotomy. The risks of the surgery including but not limited to recurrent disc herniation, persistent pain, dural tear, nerve root injury, spinal cord injury, infection, bleeding, fracture, instability, need for additional procedures, dvt/pe, and death were discussed with the patient. The benefits of the surgery would be faster relief of her radiating leg pain. I explained that back pain relief is not the goal of the surgery and it is not reliably alleviated with this surgery. The alternatives to surgical management were covered with the patient and included activity modification, physical therapy, over-the-counter pain medications, and injections.  All the patient's questions were answered to her satisfaction. After this discussion, the  patient expressed understanding and elected to proceed with surgical intervention.    ___________________________________________________________________________   History:  Patient is a 67 y.o. female who presents today for lumbar spine.  Patient has had over 6 weeks of low back pain that radiates into the right lower extremity.  The right leg pain is worse than the back pain.  She feels the pain along the lateral aspect of her thigh into the anterolateral leg.  She is not having any pain in the left lower extremity.  There was no trauma or injury that preceded the onset of pain.  She has tried multiple conservative treatments as listed below and has not noted any significant or lasting relief with those treatments.  She describes the pain as severe and burning.  She is limited in her ability to do any activity because of this pain.  She used to enjoy playing pickle ball and walking her dog which she is no longer able to do either of those activities because of this pain.  She is ambulating with a walker.   Weakness: Denies Symptoms of imbalance: Denies Paresthesias and numbness: Has baseline neuropathy from breast cancer treatment in her bilateral distal lower extremities Bowel or bladder incontinence: Denies Saddle anesthesia: Denies  Treatments tried: PT, gabapentin, Tylenol, ibuprofen, oral steroids, lumbar steroid injection, oxycodone  Review of systems: Denies fevers and chills, night sweats, unexplained weight loss, history of cancer.  Has had pain that wakes her at night  Past medical history: HLD HTN Breast cancer GERD Neuropathy  Allergies: NKDA  Past surgical history:  Left elbow irrigation debridement with bursectomy Left breast lumpectomy Neovascular procedure of left ankle for talus AVN Cholecystectomy Bilateral shoulder arthroscopy  Right de Quervain's release C-section  Social  history: Denies use of nicotine product (smoking, vaping, patches,  smokeless) Alcohol use: Yes, approximately 7 drinks per week Denies recreational drug use   Physical Exam:  BMI of 26.5  General: no acute distress, appears stated age Neurologic: alert, answering questions appropriately, following commands Respiratory: unlabored breathing on room air, symmetric chest rise Psychiatric: appropriate affect, normal cadence to speech   MSK (spine):  -Strength exam      Left  Right EHL    5/5  4/5 TA    5/5  5/5 GSC    5/5  5/5 Knee extension  5/5  5/5 Hip flexion   5/5  5/5  -Sensory exam    Sensation intact to light touch in L3-S1 nerve distributions of bilateral lower extremities  -Achilles DTR: 0/4 on the left, 0/4 on the right -Patellar tendon DTR: 0/4 on the left, 0/4 on the right  -Straight leg raise: Positive on the right, positive on the left -Clonus: no beats bilaterally  -Left hip exam: No pain through range of motion, negative Stinchfield, negative FABER -Right hip exam: No pain through range of motion, negative Stinchfield, negative FABER  Imaging: XRs of the lumbar spine from 07/01/2023 were independently reviewed and interpreted, showing disc height loss at L2/3, L3/4, L4/5, L5/S1.  Anterior osteophyte formation seen at L3/4.  No evidence of instability on flexion/extension views.  No fracture or dislocation seen.  Loss of lumbar lordosis.  MRI of the lumbar spine from 07/01/2023 was independently reviewed and interpreted, showing DDD at L3/4, L4/5, L5/S1.  Right paracentral disc herniation at L4/5 causing lateral recess stenosis.  Right foraminal stenosis at L5/S1.  No other significant stenosis seen.   Patient name: Jessica Mack Patient MRN: [id number] Date of visit: 08/09/23

## 2023-08-10 ENCOUNTER — Encounter: Payer: Self-pay | Admitting: Orthopedic Surgery

## 2023-08-10 DIAGNOSIS — M5416 Radiculopathy, lumbar region: Secondary | ICD-10-CM

## 2023-08-11 ENCOUNTER — Ambulatory Visit (HOSPITAL_BASED_OUTPATIENT_CLINIC_OR_DEPARTMENT_OTHER): Payer: PPO | Admitting: Orthopaedic Surgery

## 2023-08-11 ENCOUNTER — Encounter: Payer: PPO | Admitting: Rehabilitative and Restorative Service Providers"

## 2023-08-11 DIAGNOSIS — M7022 Olecranon bursitis, left elbow: Secondary | ICD-10-CM | POA: Diagnosis not present

## 2023-08-11 NOTE — Progress Notes (Signed)
Post Operative Evaluation    Procedure/Date of Surgery: left elbow I and D with bursa excision, triceps repair 7/9  Interval History:    Presents today status post left elbow bursectomy and triceps repair.  Overall she is continuing to improve with hand therapy.  She has no pain about the elbow at this visit.  She is pending lumbar discectomy procedure with Dr. Christell Constant   PMH/PSH/Family History/Social History/Meds/Allergies:    Past Medical History:  Diagnosis Date   Avascular necrosis of bone (HCC)    Left talus   Blood transfusion    Breast cancer (HCC) 2005   left   Cancer (HCC)    Dysplastic nevus 1988   right hip   Fatty liver    GERD (gastroesophageal reflux disease)    History of breast cancer 2005   invasive ductal cancer left breast   Hypertension    Hypertriglyceridemia    Irritable bowel syndrome    Metabolic syndrome    resolved with diet and exercise   NASH (nonalcoholic steatohepatitis)    Nocturnal leg cramps 02/14/2015   Osteopenia    Palpitations 09/2012   normal echo   Peripheral neuropathy    Personal history of chemotherapy    Personal history of radiation therapy    Plantar fasciitis 03/2003   PONV (postoperative nausea and vomiting)    Tongue dysplasia    Ulcer    Past Surgical History:  Procedure Laterality Date   ANKLE SURGERY  07/2007   left, revascularization   BREAST LUMPECTOMY  06/2004   left, with port placement   CESAREAN SECTION  1991, 1993   x2   COLONOSCOPY  09/2013   Dr. Loreta Ave   DG GALL BLADDER     IRRIGATION AND DEBRIDEMENT ELBOW Left 05/04/2023   Procedure: LEFT ELBOW IRRIGATION AND DEBRIDEMENT;  Surgeon: [name], MD;  Location: [facility];  Service: Orthopedics;  Laterality: Left;   LAPAROSCOPIC CHOLECYSTECTOMY  2003   MOUTH SURGERY  11/15   implant/crown placed 4/16   SHOULDER ARTHROSCOPY  10/2005   right   SHOULDER SURGERY  12/15   left   TONGUE SURGERY  2012,  2013   WRIST SURGERY  1988   right, [place]ains   Social History   Socioeconomic History   Marital status: Married    Spouse name: Not on file   Number of children: 2   Years of education: MD   Highest education level: Professional school degree (e.g., MD, DDS, DVM, JD)  Occupational History    Employer: [employer]   Occupation: MD/Adjunct Professor  Tobacco Use   Smoking status: Never   Smokeless tobacco: Never  Vaping Use   Vaping status: Never Used  Substance and Sexual Activity   Alcohol use: Yes    Alcohol/week: 7.0 standard drinks of alcohol    Types: 7 Standard drinks or equivalent per week   Drug use: No   Sexual activity: Yes    Partners: Male    Birth control/protection: Post-menopausal    Comment: husband vasectomy  Other Topics Concern   Not on file  Social History Narrative   Lives at home w/ husband and daughter   Retired OB/GYN physician   Patient is right handed.   Patient drinks 2 cups of caffeine daily.   Social Determinants of Health  Financial Resource Strain: Low Risk  (06/30/2023)   Overall Financial Resource Strain (CARDIA)    Difficulty of Paying Living Expenses: Not hard at all  Food Insecurity: No Food Insecurity (06/30/2023)   Hunger Vital Sign    Worried About Running Out of Food in the Last Year: Never true    Ran Out of Food in the Last Year: Never true  Transportation Needs: No Transportation Needs (06/30/2023)   PRAPARE - Administrator, Civil Service (Medical): No    Lack of Transportation (Non-Medical): No  Physical Activity: Sufficiently Active (06/30/2023)   Exercise Vital Sign    Days of Exercise per Week: 5 days    Minutes of Exercise per Session: 60 min  Stress: Stress Concern Present (06/30/2023)   [employer] of Occupational Health - Occupational Stress Questionnaire    Feeling of Stress : Rather much  Social Connections: Socially Integrated (06/30/2023)   Social Connection and Isolation Panel [NHANES]     Frequency of Communication with Friends and Family: More than three times a week    Frequency of Social Gatherings with Friends and Family: More than three times a week    Attends Religious Services: More than 4 times per year    Active Member of [employer] or Organizations: Yes    Attends Engineer, structural: More than 4 times per year    Marital Status: Married   Family History  Problem Relation Age of Onset   Thyroid disease Mother    Hyperlipidemia Mother    Hypertension Mother    Diabetes Mother    Cancer Mother        melanoma   Heart disease Mother    Depression Mother    Sleep apnea Mother    Obesity Mother    Heart disease Father    Hyperlipidemia Father    Hypertension Father    Cancer Father        colon   Prostate cancer Father    Kidney disease Father    Asthma Sister    Asthma Brother    Breast cancer Neg Hx    Allergies  Allergen Reactions   Augmentin [Amoxicillin-Pot Clavulanate]     abd cramping   Cephalexin     Profuse diarrhea   Crestor [Rosuvastatin]     Liver function elevated    Current Outpatient Medications  Medication Sig Dispense Refill   baclofen (LIORESAL) 10 MG tablet Take 0.5-1 tablets (5-10 mg total) by mouth at bedtime as needed for muscle spasms. 90 each 4   clonazePAM (KLONOPIN) 1 MG tablet Take 1 tablet (1 mg total) by mouth at bedtime. 90 tablet 1   cyclobenzaprine (FLEXERIL) 10 MG tablet Take 1 tablet (10 mg total) by mouth 3 (three) times daily as needed for muscle spasms. 30 tablet 1   Estradiol 10 MCG TABS vaginal tablet 1 tablet vaginally twice weekly 24 tablet 3   famciclovir (FAMVIR) 500 MG tablet 3 tablets (1500mg ) at onset of fever blister symptoms 9 tablet 4   famotidine (PEPCID) 10 MG tablet Take 10 mg by mouth daily.     gabapentin (NEURONTIN) 600 MG tablet Take 1 tablet (600 mg total) by mouth 3 (three) times daily. 90 tablet 0   HYDROcodone-acetaminophen (NORCO/VICODIN) 5-325 MG tablet Take 1 tablet by mouth every  4 (four) hours as needed for up to 5 days for severe pain (pain score 7-10) or moderate pain (pain score 4-6). 30 tablet 0   ketoconazole (NIZORAL) 2 %  shampoo SMARTSIG:5 Milliliter(s) Topical 3 Times a Week     methylPREDNISolone (MEDROL DOSEPAK) 4 MG TBPK tablet Take per packet instructions. Taper dosing. 1 each 0   mometasone (ELOCON) 0.1 % ointment Apply topically twice weekly 45 g 3   Multiple Vitamins-Minerals (MULTIVITAMIN PO) Take 1 tablet by mouth daily. With calcium     nebivolol (BYSTOLIC) 5 MG tablet TAKE 1 TABLET BY MOUTH DAILY 90 tablet 1   olmesartan (BENICAR) 5 MG tablet Take 2 tablets (10 mg total) by mouth daily. 180 tablet 1   omega-3 acid ethyl esters (LOVAZA) 1 g capsule TAKE TWO CAPSULES BY MOUTH TWICE DAILY 360 capsule 1   Probiotic Product (ALIGN PO) Take 1 tablet by mouth daily.     tirzepatide (ZEPBOUND) 15 MG/0.5ML Pen Inject 15 mg into the skin once a week. 2 mL 6   tirzepatide (ZEPBOUND) 5 MG/0.5ML Pen Inject 5 mg into the skin once a week.     triazolam (HALCION) 0.25 MG tablet Take one tablet (0.25 mg) by mouth with food one hour prior to procedure. May repeat 30 minutes prior if needed. 2 tablet 0   No current facility-administered medications for this visit.   No results found.  Review of Systems:   A ROS was performed including pertinent positives and negatives as documented in the HPI.   Musculoskeletal Exam:    Left elbow with well healing wound. Range of motion is 0-130 degrees.    2+ RP.  Imaging:      I personally reviewed and interpreted the radiographs.   Assessment:   13 weeks status post left bursal I and D overall improved today.  She is overall doing extremely well.  Plan :    -Return to clinic as needed    I personally saw and evaluated the patient, and participated in the management and treatment plan.  [name], MD Attending Physician, Orthopedic Surgery  This document was dictated using Dragon voice recognition  software. A reasonable attempt at proof reading has been made to minimize errors.

## 2023-08-12 ENCOUNTER — Other Ambulatory Visit: Payer: Self-pay | Admitting: Sports Medicine

## 2023-08-12 MED ORDER — CYCLOBENZAPRINE HCL 10 MG PO TABS: 10.0000 mg | ORAL_TABLET | Freq: Three times a day (TID) | ORAL | 1 refills | Status: DC | PRN

## 2023-08-13 ENCOUNTER — Encounter: Payer: PPO | Admitting: Physical Therapy

## 2023-08-16 ENCOUNTER — Encounter: Payer: PPO | Admitting: Rehabilitative and Restorative Service Providers"

## 2023-08-17 ENCOUNTER — Telehealth: Payer: Self-pay | Admitting: Orthopedic Surgery

## 2023-08-17 NOTE — Telephone Encounter (Signed)
Patient called and said she wanted to let you know about her symptoms. CB#[phone]

## 2023-08-18 ENCOUNTER — Encounter: Payer: PPO | Admitting: Rehabilitative and Restorative Service Providers"

## 2023-08-18 ENCOUNTER — Telehealth: Payer: Self-pay | Admitting: Orthopedic Surgery

## 2023-08-18 NOTE — Telephone Encounter (Signed)
Patient left a message on my voicemail requesting to talk to [place], doctors assistant if reference to her condition. Patient has been scheduled for surgery on 09/06/23.

## 2023-08-19 NOTE — Telephone Encounter (Signed)
Recd her myChart message, sent to Dr. Christell Constant to review.

## 2023-08-19 NOTE — Telephone Encounter (Signed)
She sent a MyChart message and it was sent to Dr. Christell Constant to review

## 2023-08-20 ENCOUNTER — Encounter: Payer: PPO | Admitting: Rehabilitative and Restorative Service Providers"

## 2023-08-23 ENCOUNTER — Encounter: Payer: PPO | Admitting: Rehabilitative and Restorative Service Providers"

## 2023-08-23 ENCOUNTER — Encounter (HOSPITAL_COMMUNITY): Payer: Self-pay | Admitting: Orthopedic Surgery

## 2023-08-23 NOTE — H&P (Signed)
Orthopedic Spine Surgery H&P Note  Assessment: Patient is a 67 y.o. female with lumbar radiculopathy   Plan: -Out of bed as tolerated, activity as tolerated, no brace -Covered the risks of surgery one more time with the patient and patient elected to proceed with planned surgery -Written consent verified -Hold anticoagulation in anticipation of surgery -Ancef and TXA on all to OR -NPO for procedure -Site marked -To OR when ready  The risks of the surgery were again covered this morning and included but were not limited to: recurrent disc herniation, persistent pain, dural tear, nerve root injury, spinal cord injury, infection, bleeding, fracture, instability, need for additional procedures, dvt/pe, and death. ___________________________________________________________________________  Chief Complaint: right lower extremity pain  History: Patient is 67 y.o. female who has been previously seen in the office for right lower extremity pain. Her pain was in an L5 distribution on the right side. Work up revealed a disc herniation at L4/5 and foraminal stenosis on the right at L5/S1. Her symptoms failed to improve with conservative treatment so operative management was discussed at the last office visit. The patient presents today with no changes in her symptoms since the last office visit. See previous office note for further details.    Review of systems: General: denies fevers and chills, myalgias Neurologic: denies recent changes in vision, slurred speech Abdomen: denies nausea, vomiting, hematemesis Respiratory: denies cough, shortness of breath  Past medical history: HLD HTN Breast cancer GERD Neuropathy   Allergies: NKDA   Past surgical history:  Left elbow irrigation debridement with bursectomy Left breast lumpectomy Neovascular procedure of left ankle for talus AVN Cholecystectomy Bilateral shoulder arthroscopy  Right de Quervain's release C-section   Social  history: Denies use of nicotine product (smoking, vaping, patches, smokeless) Alcohol use: Yes, approximately 7 drinks per week Denies recreational drug use  Family history: -reviewed and not pertinent to lumbar radiculopathy   Physical Exam:  General: no acute distress, appears stated age Neurologic: alert, answering questions appropriately, following commands Cardiovascular: regular rate, no cyanosis Respiratory: unlabored breathing on room air, symmetric chest rise Psychiatric: appropriate affect, normal cadence to speech   MSK (spine):  -Strength exam      Left  Right  EHL    5/5  4/5 TA    5/5  5/5 GSC    5/5  5/5 Knee extension  5/5  5/5 Knee flexion   5/5  5/5 Hip flexion   5/5  5/5  -Sensory exam    Sensation intact to light touch in L3-S1 nerve distributions of bilateral lower extremities   Patient name: Jessica Mack Patient MRN: [id number] Date: 08/24/2023

## 2023-08-23 NOTE — Progress Notes (Signed)
SDW call  Patient was given pre-op instructions over the phone. Patient verbalized understanding of instructions provided.     PCP - Dr. Sanda Linger Cardiologist -  Pulmonary:    PPM/ICD - denies Device Orders - na Rep Notified - na   Chest x-ray - 09/28/2022 EKG -  05/03/2023 Stress Test - ECHO - 10/17/2012 Cardiac Cath -   Sleep Study/sleep apnea/CPAP: denies  Non-diabetic  GLP1: Zepbound, states last dose 08/19/2023  Blood Thinner Instructions: denies Aspirin Instructions:denies   ERAS Protcol - Clears until 1130  COVID TEST- na    Anesthesia review: No   Patient denies shortness of breath, fever, cough and chest pain over the phone call  Your procedure is scheduled on Tuesday August 24, 2023  Report to [facility] Main Entrance "A" at  [facility], then check in with the Admitting office.  Call this number if you have problems the morning of surgery:  [phone]   If you have any questions prior to your surgery date call [phone]: Open Monday-Friday 8am-4pm If you experience any cold or flu symptoms such as cough, fever, chills, shortness of breath, etc. between now and your scheduled surgery, please notify us at the above number    Remember:  Do not eat after midnight the night before your surgery  You may drink clear liquids until 1130  the morning of your surgery.   Clear liquids allowed are: Water, Non-Citrus Juices (without pulp), Carbonated Beverages, Clear Tea, Black Coffee ONLY (NO MILK, CREAM OR POWDERED CREAMER of any kind), and Gatorade   Take these medicines the morning of surgery with A SIP OF WATER:  Pepcid, gabapentin, bystolic  As needed: Tylenol, flexeril, oxy or percocet  As of today, STOP taking any Aspirin (unless otherwise instructed by your surgeon) Aleve, Naproxen, Ibuprofen, Motrin, Advil, Goody's, BC's, all herbal medications, fish oil, and all vitamins.

## 2023-08-24 ENCOUNTER — Other Ambulatory Visit: Payer: Self-pay

## 2023-08-24 ENCOUNTER — Encounter (HOSPITAL_COMMUNITY): Payer: Self-pay | Admitting: Orthopedic Surgery

## 2023-08-24 ENCOUNTER — Ambulatory Visit (HOSPITAL_COMMUNITY): Payer: PPO

## 2023-08-24 ENCOUNTER — Ambulatory Visit (HOSPITAL_BASED_OUTPATIENT_CLINIC_OR_DEPARTMENT_OTHER): Payer: PPO | Admitting: Anesthesiology

## 2023-08-24 ENCOUNTER — Observation Stay (HOSPITAL_COMMUNITY)
Admission: RE | Admit: 2023-08-24 | Discharge: 2023-08-25 | Disposition: A | Discharge status: Home / Self Care | Payer: PPO | Attending: Orthopedic Surgery | Admitting: Orthopedic Surgery

## 2023-08-24 ENCOUNTER — Encounter (HOSPITAL_COMMUNITY)
Admission: RE | Disposition: A | Discharge status: Home / Self Care | Payer: Self-pay | Source: Home / Self Care | Attending: Orthopedic Surgery

## 2023-08-24 ENCOUNTER — Ambulatory Visit (HOSPITAL_COMMUNITY): Payer: PPO | Admitting: Anesthesiology

## 2023-08-24 DIAGNOSIS — I10 Essential (primary) hypertension: Secondary | ICD-10-CM | POA: Insufficient documentation

## 2023-08-24 DIAGNOSIS — Z853 Personal history of malignant neoplasm of breast: Secondary | ICD-10-CM | POA: Diagnosis not present

## 2023-08-24 DIAGNOSIS — M5416 Radiculopathy, lumbar region: Principal | ICD-10-CM | POA: Diagnosis present

## 2023-08-24 DIAGNOSIS — M5116 Intervertebral disc disorders with radiculopathy, lumbar region: Secondary | ICD-10-CM | POA: Insufficient documentation

## 2023-08-24 DIAGNOSIS — M4807 Spinal stenosis, lumbosacral region: Secondary | ICD-10-CM | POA: Diagnosis present

## 2023-08-24 HISTORY — PX: LUMBAR LAMINECTOMY: SHX95

## 2023-08-24 LAB — POCT I-STAT, CHEM 8
BUN: 21 mg/dL (ref 8–23)
Calcium, Ion: 1.27 mmol/L (ref 1.15–1.40)
Chloride: 97 mmol/L — ABNORMAL LOW (ref 98–111)
Creatinine, Ser: 0.7 mg/dL (ref 0.44–1.00)
Glucose, Bld: 84 mg/dL (ref 70–99)
HCT: 38 % (ref 36.0–46.0)
Hemoglobin: 12.9 g/dL (ref 12.0–15.0)
Potassium: 4.3 mmol/L (ref 3.5–5.1)
Sodium: 134 mmol/L — ABNORMAL LOW (ref 135–145)
TCO2: 26 mmol/L (ref 22–32)

## 2023-08-24 LAB — SURGICAL PCR SCREEN
MRSA, PCR: NEGATIVE
Staphylococcus aureus: NEGATIVE

## 2023-08-24 SURGERY — MICRODISCECTOMY LUMBAR LAMINECTOMY
Anesthesia: General | Laterality: Right

## 2023-08-24 MED ORDER — ACETAMINOPHEN 10 MG/ML IV SOLN
1000.0000 mg | Freq: Once | INTRAVENOUS | Status: AC
  Administered 2023-08-24: 1000 mg via INTRAVENOUS

## 2023-08-24 MED ORDER — CLINDAMYCIN PHOSPHATE 600 MG/50ML IV SOLN
600.0000 mg | INTRAVENOUS | Status: AC
  Administered 2023-08-24: 600 mg via INTRAVENOUS

## 2023-08-24 MED ORDER — DEXAMETHASONE SODIUM PHOSPHATE 10 MG/ML IJ SOLN
INTRAMUSCULAR | Status: AC
  Administered 2023-08-24: 10 mg via INTRAVENOUS
  Filled 2023-08-24: qty 1

## 2023-08-24 MED ORDER — FENTANYL CITRATE (PF) 250 MCG/5ML IJ SOLN
INTRAMUSCULAR | Status: DC | PRN
  Administered 2023-08-24: 50 ug via INTRAVENOUS
  Administered 2023-08-24: 100 ug via INTRAVENOUS
  Administered 2023-08-24: 25 ug via INTRAVENOUS
  Administered 2023-08-24: 50 ug via INTRAVENOUS
  Administered 2023-08-24: 25 ug via INTRAVENOUS

## 2023-08-24 MED ORDER — ACETAMINOPHEN 500 MG PO TABS
ORAL_TABLET | ORAL | Status: AC
  Filled 2023-08-24: qty 2

## 2023-08-24 MED ORDER — METHYLPREDNISOLONE ACETATE 40 MG/ML IJ SUSP
INTRAMUSCULAR | Status: AC
Start: 2023-08-24 — End: ?
  Filled 2023-08-24: qty 1

## 2023-08-24 MED ORDER — MIDAZOLAM HCL 2 MG/2ML IJ SOLN
INTRAMUSCULAR | Status: AC
  Filled 2023-08-24: qty 2

## 2023-08-24 MED ORDER — CLINDAMYCIN PHOSPHATE 600 MG/50ML IV SOLN
600.0000 mg | Freq: Four times a day (QID) | INTRAVENOUS | Status: AC
  Administered 2023-08-24 – 2023-08-25 (×3): 600 mg via INTRAVENOUS
  Filled 2023-08-24 (×3): qty 50

## 2023-08-24 MED ORDER — POLYETHYLENE GLYCOL 3350 17 G PO PACK
17.0000 g | PACK | Freq: Two times a day (BID) | ORAL | Status: DC
  Filled 2023-08-24 (×2): qty 1

## 2023-08-24 MED ORDER — MIDAZOLAM HCL 2 MG/2ML IJ SOLN
INTRAMUSCULAR | Status: DC | PRN
  Administered 2023-08-24: 2 mg via INTRAVENOUS

## 2023-08-24 MED ORDER — THROMBIN 20000 UNITS EX SOLR
CUTANEOUS | Status: DC | PRN
  Administered 2023-08-24: 20 mL via TOPICAL

## 2023-08-24 MED ORDER — METHYLPREDNISOLONE ACETATE 80 MG/ML IJ SUSP
INTRAMUSCULAR | Status: AC
  Filled 2023-08-24: qty 1

## 2023-08-24 MED ORDER — LACTATED RINGERS IV SOLN: INTRAVENOUS | Status: DC

## 2023-08-24 MED ORDER — ADULT MULTIVITAMIN W/MINERALS CH
1.0000 | ORAL_TABLET | Freq: Every day | ORAL | Status: DC
  Administered 2023-08-25: 1 via ORAL
  Filled 2023-08-24: qty 1

## 2023-08-24 MED ORDER — THROMBIN 20000 UNITS EX SOLR
CUTANEOUS | Status: AC
  Filled 2023-08-24: qty 20000

## 2023-08-24 MED ORDER — DEXAMETHASONE SODIUM PHOSPHATE 10 MG/ML IJ SOLN
8.0000 mg | Freq: Three times a day (TID) | INTRAMUSCULAR | Status: DC
  Administered 2023-08-24 – 2023-08-25 (×2): 8 mg via INTRAVENOUS
  Filled 2023-08-24 (×2): qty 1

## 2023-08-24 MED ORDER — DEXAMETHASONE SODIUM PHOSPHATE 10 MG/ML IJ SOLN
10.0000 mg | Freq: Once | INTRAMUSCULAR | Status: AC
Start: 2023-08-24 — End: 2023-08-24

## 2023-08-24 MED ORDER — POVIDONE-IODINE 10 % EX SWAB
2.0000 | Freq: Once | CUTANEOUS | Status: AC
  Administered 2023-08-24: 2 via TOPICAL

## 2023-08-24 MED ORDER — TRANEXAMIC ACID-NACL 1000-0.7 MG/100ML-% IV SOLN
INTRAVENOUS | Status: AC
  Filled 2023-08-24: qty 100

## 2023-08-24 MED ORDER — ACETAMINOPHEN 325 MG PO TABS
650.0000 mg | ORAL_TABLET | Freq: Once | ORAL | Status: AC
  Administered 2023-08-24: 650 mg via ORAL

## 2023-08-24 MED ORDER — ROCURONIUM BROMIDE 10 MG/ML (PF) SYRINGE
PREFILLED_SYRINGE | INTRAVENOUS | Status: DC | PRN
  Administered 2023-08-24: 20 mg via INTRAVENOUS
  Administered 2023-08-24 (×2): 50 mg via INTRAVENOUS
  Administered 2023-08-24: 30 mg via INTRAVENOUS

## 2023-08-24 MED ORDER — CLINDAMYCIN PHOSPHATE 600 MG/50ML IV SOLN
INTRAVENOUS | Status: AC
  Filled 2023-08-24: qty 50

## 2023-08-24 MED ORDER — LIDOCAINE 2% (20 MG/ML) 5 ML SYRINGE
INTRAMUSCULAR | Status: DC | PRN
  Administered 2023-08-24: 80 mg via INTRAVENOUS

## 2023-08-24 MED ORDER — OXYCODONE HCL 5 MG PO TABS
5.0000 mg | ORAL_TABLET | ORAL | Status: DC | PRN
  Administered 2023-08-25: 10 mg via ORAL
  Filled 2023-08-24: qty 2

## 2023-08-24 MED ORDER — LACTATED RINGERS IV SOLN: INTRAVENOUS | Status: DC | PRN

## 2023-08-24 MED ORDER — AMISULPRIDE (ANTIEMETIC) 5 MG/2ML IV SOLN: 10.0000 mg | Freq: Once | INTRAVENOUS | Status: DC | PRN

## 2023-08-24 MED ORDER — TRANEXAMIC ACID-NACL 1000-0.7 MG/100ML-% IV SOLN
1000.0000 mg | INTRAVENOUS | Status: AC
  Administered 2023-08-24: 1000 mg via INTRAVENOUS

## 2023-08-24 MED ORDER — ONDANSETRON HCL 4 MG/2ML IJ SOLN
INTRAMUSCULAR | Status: DC | PRN
  Administered 2023-08-24: 4 mg via INTRAVENOUS

## 2023-08-24 MED ORDER — GABAPENTIN 300 MG PO CAPS
600.0000 mg | ORAL_CAPSULE | Freq: Three times a day (TID) | ORAL | Status: DC
  Administered 2023-08-24 – 2023-08-25 (×2): 600 mg via ORAL
  Filled 2023-08-24 (×2): qty 2

## 2023-08-24 MED ORDER — ONDANSETRON HCL 4 MG/2ML IJ SOLN: 4.0000 mg | Freq: Once | INTRAMUSCULAR | Status: DC | PRN

## 2023-08-24 MED ORDER — ACETAMINOPHEN 500 MG PO TABS: 1000.0000 mg | ORAL_TABLET | Freq: Once | ORAL | Status: DC

## 2023-08-24 MED ORDER — FENTANYL CITRATE (PF) 100 MCG/2ML IJ SOLN
INTRAMUSCULAR | Status: AC
  Filled 2023-08-24: qty 2

## 2023-08-24 MED ORDER — ACETAMINOPHEN 500 MG PO TABS
1000.0000 mg | ORAL_TABLET | Freq: Three times a day (TID) | ORAL | Status: DC
  Administered 2023-08-24 – 2023-08-25 (×2): 1000 mg via ORAL
  Filled 2023-08-24 (×2): qty 2

## 2023-08-24 MED ORDER — CHLORHEXIDINE GLUCONATE 0.12 % MT SOLN: 15.0000 mL | Freq: Once | OROMUCOSAL | Status: AC

## 2023-08-24 MED ORDER — FENTANYL CITRATE (PF) 100 MCG/2ML IJ SOLN
25.0000 ug | INTRAMUSCULAR | Status: DC | PRN
Start: 2023-08-24 — End: 2023-08-24
  Administered 2023-08-24 (×2): 50 ug via INTRAVENOUS

## 2023-08-24 MED ORDER — CHLORHEXIDINE GLUCONATE 0.12 % MT SOLN
OROMUCOSAL | Status: AC
  Administered 2023-08-24: 15 mL via OROMUCOSAL
  Filled 2023-08-24: qty 15

## 2023-08-24 MED ORDER — BUPIVACAINE HCL (PF) 0.25 % IJ SOLN
INTRAMUSCULAR | Status: AC
Start: 2023-08-24 — End: ?
  Filled 2023-08-24: qty 30

## 2023-08-24 MED ORDER — FAMOTIDINE 20 MG PO TABS
20.0000 mg | ORAL_TABLET | Freq: Every day | ORAL | Status: DC
  Administered 2023-08-25: 20 mg via ORAL
  Filled 2023-08-24: qty 1

## 2023-08-24 MED ORDER — ONDANSETRON HCL 4 MG PO TABS: 4.0000 mg | ORAL_TABLET | Freq: Four times a day (QID) | ORAL | Status: DC | PRN

## 2023-08-24 MED ORDER — ONDANSETRON HCL 4 MG/2ML IJ SOLN: 4.0000 mg | Freq: Four times a day (QID) | INTRAMUSCULAR | Status: DC | PRN

## 2023-08-24 MED ORDER — HYDROMORPHONE HCL 1 MG/ML IJ SOLN
0.5000 mg | INTRAMUSCULAR | Status: AC | PRN
  Administered 2023-08-24: 0.5 mg via INTRAVENOUS
  Filled 2023-08-24: qty 0.5

## 2023-08-24 MED ORDER — PROPOFOL 10 MG/ML IV BOLUS
INTRAVENOUS | Status: DC | PRN
  Administered 2023-08-24: 140 mg via INTRAVENOUS

## 2023-08-24 MED ORDER — FENTANYL CITRATE (PF) 250 MCG/5ML IJ SOLN
INTRAMUSCULAR | Status: AC
  Filled 2023-08-24: qty 5

## 2023-08-24 MED ORDER — TRANEXAMIC ACID-NACL 1000-0.7 MG/100ML-% IV SOLN
1000.0000 mg | Freq: Once | INTRAVENOUS | Status: AC
  Administered 2023-08-24: 1000 mg via INTRAVENOUS
  Filled 2023-08-24: qty 100

## 2023-08-24 MED ORDER — 0.9 % SODIUM CHLORIDE (POUR BTL) OPTIME
TOPICAL | Status: DC | PRN
  Administered 2023-08-24: 1000 mL

## 2023-08-24 MED ORDER — ORAL CARE MOUTH RINSE: 15.0000 mL | Freq: Once | OROMUCOSAL | Status: AC

## 2023-08-24 MED ORDER — NEBIVOLOL HCL 5 MG PO TABS
5.0000 mg | ORAL_TABLET | Freq: Every day | ORAL | Status: DC
  Administered 2023-08-25: 5 mg via ORAL
  Filled 2023-08-24: qty 1

## 2023-08-24 MED ORDER — HEMOSTATIC AGENTS (NO CHARGE) OPTIME
TOPICAL | Status: DC | PRN
  Administered 2023-08-24: 1 via TOPICAL

## 2023-08-24 MED ORDER — ACETAMINOPHEN 10 MG/ML IV SOLN
INTRAVENOUS | Status: AC
  Filled 2023-08-24: qty 100

## 2023-08-24 MED ORDER — ACETAMINOPHEN 325 MG PO TABS
ORAL_TABLET | ORAL | Status: AC
  Filled 2023-08-24: qty 2

## 2023-08-24 MED ORDER — PROPOFOL 10 MG/ML IV BOLUS
INTRAVENOUS | Status: AC
  Filled 2023-08-24: qty 20

## 2023-08-24 MED ORDER — SUGAMMADEX SODIUM 200 MG/2ML IV SOLN
INTRAVENOUS | Status: DC | PRN
  Administered 2023-08-24: 200 mg via INTRAVENOUS

## 2023-08-24 MED ORDER — PHENYLEPHRINE HCL-NACL 20-0.9 MG/250ML-% IV SOLN
INTRAVENOUS | Status: DC | PRN
  Administered 2023-08-24: 20 ug/min via INTRAVENOUS

## 2023-08-24 MED ORDER — CYCLOBENZAPRINE HCL 10 MG PO TABS
10.0000 mg | ORAL_TABLET | Freq: Three times a day (TID) | ORAL | Status: DC | PRN
  Administered 2023-08-24 – 2023-08-25 (×2): 10 mg via ORAL
  Filled 2023-08-24 (×2): qty 1

## 2023-08-24 MED ORDER — METHYLPREDNISOLONE ACETATE 40 MG/ML IJ SUSP
INTRAMUSCULAR | Status: DC | PRN
  Administered 2023-08-24: 40 mg

## 2023-08-24 MED ORDER — SENNA 8.6 MG PO TABS
1.0000 | ORAL_TABLET | Freq: Two times a day (BID) | ORAL | Status: DC
  Filled 2023-08-24 (×2): qty 1

## 2023-08-24 SURGICAL SUPPLY — 51 items
APL SKNCLS STERI-STRIP NONHPOA (GAUZE/BANDAGES/DRESSINGS) ×1
BENZOIN TINCTURE PRP APPL 2/3 (GAUZE/BANDAGES/DRESSINGS) IMPLANT
BUR NEURO DRILL SOFT 3.0X3.8M (BURR) ×1 IMPLANT
CANISTER SUCT 3000ML PPV (MISCELLANEOUS) ×1 IMPLANT
CORD BIPOLAR FORCEPS 12FT (ELECTRODE) ×1 IMPLANT
COVER MAYO STAND STRL (DRAPES) ×1 IMPLANT
COVER SURGICAL LIGHT HANDLE (MISCELLANEOUS) ×1 IMPLANT
DRAPE C-ARM 42X72 X-RAY (DRAPES) ×1 IMPLANT
DRAPE MICROSCOPE LEICA 54X105 (DRAPES) ×1 IMPLANT
DRAPE UTILITY XL STRL (DRAPES) ×1 IMPLANT
DRESSING MEPILEX FLEX 4X4 (GAUZE/BANDAGES/DRESSINGS) ×1 IMPLANT
DRSG MEPILEX FLEX 4X4 (GAUZE/BANDAGES/DRESSINGS) ×1
DRSG MEPILEX POST OP 4X8 (GAUZE/BANDAGES/DRESSINGS) IMPLANT
DRSG TEGADERM 4X10 (GAUZE/BANDAGES/DRESSINGS) ×1 IMPLANT
DURAPREP 26ML APPLICATOR (WOUND CARE) ×1 IMPLANT
ELECT BLADE 4.0 EZ CLEAN MEGAD (MISCELLANEOUS) ×1
ELECT BLADE INSULATED 4IN (ELECTROSURGICAL) ×1
ELECT PENCIL ROCKER SW 15FT (MISCELLANEOUS) ×1 IMPLANT
ELECT REM PT RETURN 9FT ADLT (ELECTROSURGICAL) ×1
ELECTRODE BLADE INSULATED 4IN (ELECTROSURGICAL) ×1 IMPLANT
ELECTRODE BLDE 4.0 EZ CLN MEGD (MISCELLANEOUS) ×1 IMPLANT
ELECTRODE REM PT RTRN 9FT ADLT (ELECTROSURGICAL) ×1 IMPLANT
GLOVE BIO SURGEON STRL SZ7.5 (GLOVE) ×1 IMPLANT
GLOVE INDICATOR 7.5 STRL GRN (GLOVE) ×1 IMPLANT
GOWN STRL REUS W/ TWL LRG LVL3 (GOWN DISPOSABLE) ×1 IMPLANT
GOWN STRL REUS W/TWL LRG LVL3 (GOWN DISPOSABLE) ×1
GOWN STRL SURGICAL XL XLNG (GOWN DISPOSABLE) ×1 IMPLANT
KIT BASIN OR (CUSTOM PROCEDURE TRAY) ×1 IMPLANT
KIT POSITION SURG JACKSON T1 (MISCELLANEOUS) ×1 IMPLANT
KIT TURNOVER KIT B (KITS) ×1 IMPLANT
NDL 22X1.5 STRL (OR ONLY) (MISCELLANEOUS) ×1 IMPLANT
NEEDLE 22X1.5 STRL (OR ONLY) (MISCELLANEOUS) ×1
NS IRRIG 1000ML POUR BTL (IV SOLUTION) ×1 IMPLANT
PACK LAMINECTOMY ORTHO (CUSTOM PROCEDURE TRAY) ×1 IMPLANT
PATTIES SURGICAL .5 X.5 (GAUZE/BANDAGES/DRESSINGS) ×1 IMPLANT
SPONGE SURGIFOAM ABS GEL 100 (HEMOSTASIS) ×1 IMPLANT
SPONGE SURGIFOAM ABS GEL SZ50 (HEMOSTASIS) IMPLANT
SPONGE T-LAP 4X18 ~~LOC~~+RFID (SPONGE) ×1 IMPLANT
STRIP CLOSURE SKIN 1/2X4 (GAUZE/BANDAGES/DRESSINGS) IMPLANT
SUCTION TUBE FRAZIER 10FR DISP (SUCTIONS) ×1 IMPLANT
SUT BONE WAX W31G (SUTURE) ×1 IMPLANT
SUT MNCRL AB 3-0 PS2 18 (SUTURE) ×1 IMPLANT
SUT VIC AB 0 CT1 18XCR BRD8 (SUTURE) ×1 IMPLANT
SUT VIC AB 0 CT1 8-18 (SUTURE) ×1
SUT VIC AB 2-0 CT1 18 (SUTURE) ×1 IMPLANT
SYR BULB IRRIG 60ML STRL (SYRINGE) ×1 IMPLANT
SYR CONTROL 10ML LL (SYRINGE) ×1 IMPLANT
TOWEL GREEN STERILE (TOWEL DISPOSABLE) ×1 IMPLANT
TOWEL GREEN STERILE FF (TOWEL DISPOSABLE) ×1 IMPLANT
TUBING FEATHERFLOW (TUBING) ×1 IMPLANT
WATER STERILE IRR 1000ML POUR (IV SOLUTION) ×1 IMPLANT

## 2023-08-24 NOTE — Progress Notes (Signed)
Orthopedic Surgery Post-operative Progress Note  Assessment: Patient is a 67 y.o. female who is currently admitted after undergoing right L4/5 hemilaminotomy with discectomy, right L5/S1 foraminotomy   Plan: -Operative plans complete -Drains: none -Out of bed as tolerated, no brace -No bending/lifting/twisting greater than 10 pounds -OT evaluate and treat -Pain control -Regular diet -No chemoprophylaxis for dvt or antiplatelets for 72 hours after surgery -Clindamycin x2 post-operative doses -Disposition: remain floor status  ___________________________________________________________________________   Subjective: No acute events since surgery. Has moved from PACU to the floor. Initially was worried she was on the covid unit. Having a lot of back pain. Feels tightness, tingling, and burning in her back. Also, reporting burning pain over her lateral leg that is particularly bad over the lateral malleolus on the right side. No left leg symptoms. Also, reports a grinding sensation in her right hip.   Objective:  General: no acute distress, appropriate affect Neurologic: alert, answering questions appropriately, following commands Respiratory: unlabored breathing on room air Skin: dressing clear/dry/intact  MSK (spine):  -Strength exam      Right  Left  EHL    4/5  5/5 TA    4/5  5/5 GSC    5/5  5/5 Knee extension  5/5  5/5 Hip flexion   5/5  5/5  -Sensory exam    Sensation intact to light touch in L3-S1 nerve distributions of bilateral lower extremities (decreased in bilateral feet from chronic neuropathy)   Patient name: Jessica Mack Patient MRN: [id number] Date: 08/24/23

## 2023-08-24 NOTE — Anesthesia Procedure Notes (Signed)
Procedure Name: Intubation Date/Time: 08/24/2023 4:32 PM  Performed by: Sandie Ano, CRNAPre-anesthesia Checklist: Patient identified, Emergency Drugs available, Suction available and Patient being monitored Patient Re-evaluated:Patient Re-evaluated prior to induction Oxygen Delivery Method: Circle System Utilized Preoxygenation: Pre-oxygenation with 100% oxygen Induction Type: IV induction Ventilation: Mask ventilation without difficulty Laryngoscope Size: Mac and 3 Grade View: Grade I Tube type: Oral Tube size: 7.0 mm Number of attempts: 1 Airway Equipment and Method: Stylet and Oral airway Placement Confirmation: ETT inserted through vocal cords under direct vision, positive ETCO2 and breath sounds checked- equal and bilateral Secured at: 21 cm Tube secured with: Tape Dental Injury: Teeth and Oropharynx as per pre-operative assessment

## 2023-08-24 NOTE — Brief Op Note (Signed)
08/24/2023  8:03 PM  PATIENT:  Jessica Mack  67 y.o. female  PRE-OPERATIVE DIAGNOSIS:  LUMBAR RADICULOPATHY  POST-OPERATIVE DIAGNOSIS:  LUMBAR RADICULOPATHY  PROCEDURE:  Procedure(s): LUMBAR FOUR-FIVE MICRODISCECTOMY, LUMBAR FIVE-SACRAL ONE RIGHT FORAMINOTOMY (Right)  SURGEON:  Surgeons and Role:    London Sheer, MD - Primary  PHYSICIAN ASSISTANT: None  ASSISTANTS: none   ANESTHESIA:   general  EBL:  200 mL   BLOOD ADMINISTERED:none  DRAINS: none   LOCAL MEDICATIONS USED:  NONE  SPECIMEN:  No Specimen  DISPOSITION OF SPECIMEN:  N/A  COUNTS:  YES  TOURNIQUET:  NONE  DICTATION: .Note written in EPIC  PLAN OF CARE: Admit for overnight observation  PATIENT DISPOSITION:  PACU - hemodynamically stable.   Delay start of Pharmacological VTE agent (>24hrs) due to surgical blood loss or risk of bleeding: yes

## 2023-08-24 NOTE — Anesthesia Preprocedure Evaluation (Signed)
Anesthesia Evaluation  Patient identified by MRN, date of birth, ID band Patient awake    Reviewed: Allergy & Precautions, NPO status , Patient's Chart, lab work & pertinent test results, reviewed documented beta blocker date and time   History of Anesthesia Complications (+) PONV and history of anesthetic complications  Airway Mallampati: II  TM Distance: >3 FB Neck ROM: Full    Dental  (+) Teeth Intact, Dental Advisory Given   Pulmonary neg pulmonary ROS   Pulmonary exam normal breath sounds clear to auscultation       Cardiovascular hypertension, Pt. on home beta blockers and Pt. on medications Normal cardiovascular exam Rhythm:Regular Rate:Normal     Neuro/Psych LUMBAR RADICULOPATHY  Neuromuscular disease    GI/Hepatic ,GERD  Medicated,,(+) Hepatitis -  Endo/Other  negative endocrine ROS    Renal/GU negative Renal ROS     Musculoskeletal negative musculoskeletal ROS (+)    Abdominal   Peds  Hematology negative hematology ROS (+)   Anesthesia Other Findings Day of surgery medications reviewed with the patient.  Reproductive/Obstetrics                             Anesthesia Physical Anesthesia Plan  ASA: 2  Anesthesia Plan: General   Post-op Pain Management: Tylenol PO (pre-op)*   Induction: Intravenous  PONV Risk Score and Plan: 4 or greater and Midazolam, Dexamethasone and Ondansetron  Airway Management Planned: Oral ETT  Additional Equipment:   Intra-op Plan:   Post-operative Plan: Extubation in OR  Informed Consent: I have reviewed the patients History and Physical, chart, labs and discussed the procedure including the risks, benefits and alternatives for the proposed anesthesia with the patient or authorized representative who has indicated his/her understanding and acceptance.     Dental advisory given  Plan Discussed with: CRNA  Anesthesia Plan Comments:         Anesthesia Quick Evaluation

## 2023-08-24 NOTE — Transfer of Care (Signed)
Immediate Anesthesia Transfer of Care Note  Patient: Jessica Mack  Procedure(s) Performed: LUMBAR FOUR-FIVE MICRODISCECTOMY, LUMBAR FIVE-SACRAL ONE RIGHT FORAMINOTOMY (Right)  Patient Location: PACU  Anesthesia Type:General  Level of Consciousness: awake and alert   Airway & Oxygen Therapy: Patient Spontanous Breathing and Patient connected to nasal cannula oxygen  Post-op Assessment: Report given to RN and Post -op Vital signs reviewed and stable  Post vital signs: Reviewed and stable  Last Vitals:  Vitals Value Taken Time  BP 98/69 08/24/23 2015  Temp 36.9 C 08/24/23 2013  Pulse 95 08/24/23 2015  Resp 11 08/24/23 2018  SpO2 95 % 08/24/23 2015  Vitals shown include unfiled device data.  Last Pain:  Vitals:   08/24/23 1329  TempSrc:   PainSc: 3       Patients Stated Pain Goal: 4 (08/24/23 1329)  Complications: No notable events documented.

## 2023-08-24 NOTE — Anesthesia Postprocedure Evaluation (Signed)
Anesthesia Post Note  Patient: Jessica Mack  Procedure(s) Performed: LUMBAR FOUR-FIVE MICRODISCECTOMY, LUMBAR FIVE-SACRAL ONE RIGHT FORAMINOTOMY (Right)     Patient location during evaluation: PACU Anesthesia Type: General Level of consciousness: awake and alert Pain management: pain level controlled Vital Signs Assessment: post-procedure vital signs reviewed and stable Respiratory status: spontaneous breathing, nonlabored ventilation, respiratory function stable and patient connected to nasal cannula oxygen Cardiovascular status: blood pressure returned to baseline and stable Postop Assessment: no apparent nausea or vomiting Anesthetic complications: no   No notable events documented.  Last Vitals:  Vitals:   08/24/23 2030 08/24/23 2045  BP: 111/85 120/81  Pulse: 96 98  Resp: 12 12  Temp:  (!) 36.4 C  SpO2: 97% 95%    Last Pain:  Vitals:   08/24/23 2045  TempSrc:   PainSc: 10-Worst pain ever      LLE Sensation: No numbness;No tingling (08/24/23 2045)   RLE Sensation: No numbness;No tingling (08/24/23 2045)      [facility]

## 2023-08-25 ENCOUNTER — Encounter (HOSPITAL_COMMUNITY): Payer: Self-pay | Admitting: Orthopedic Surgery

## 2023-08-25 ENCOUNTER — Encounter: Payer: PPO | Admitting: Rehabilitative and Restorative Service Providers"

## 2023-08-25 DIAGNOSIS — M4807 Spinal stenosis, lumbosacral region: Secondary | ICD-10-CM | POA: Diagnosis not present

## 2023-08-25 LAB — CBC
HCT: 34.2 % — ABNORMAL LOW (ref 36.0–46.0)
Hemoglobin: 11.7 g/dL — ABNORMAL LOW (ref 12.0–15.0)
MCH: 31.5 pg (ref 26.0–34.0)
MCHC: 34.2 g/dL (ref 30.0–36.0)
MCV: 91.9 fL (ref 80.0–100.0)
Platelets: 279 10*3/uL (ref 150–400)
RBC: 3.72 MIL/uL — ABNORMAL LOW (ref 3.87–5.11)
RDW: 13.2 % (ref 11.5–15.5)
WBC: 7.5 10*3/uL (ref 4.0–10.5)
nRBC: 0 % (ref 0.0–0.2)

## 2023-08-25 LAB — BASIC METABOLIC PANEL
Anion gap: 12 (ref 5–15)
BUN: 15 mg/dL (ref 8–23)
CO2: 21 mmol/L — ABNORMAL LOW (ref 22–32)
Calcium: 9.3 mg/dL (ref 8.9–10.3)
Chloride: 101 mmol/L (ref 98–111)
Creatinine, Ser: 0.75 mg/dL (ref 0.44–1.00)
GFR, Estimated: >60 mL/min (ref >60)
Glucose, Bld: 164 mg/dL — ABNORMAL HIGH (ref 70–99)
Potassium: 3.9 mmol/L (ref 3.5–5.1)
Sodium: 134 mmol/L — ABNORMAL LOW (ref 135–145)

## 2023-08-25 MED ORDER — HYDROMORPHONE HCL 1 MG/ML IJ SOLN: 0.5000 mg | INTRAMUSCULAR | Status: DC | PRN

## 2023-08-25 NOTE — Discharge Summary (Signed)
Orthopedic Surgery Discharge Summary  Patient name: Jessica Mack Patient MRN: [id number] Admit today: 08/24/2023 Discharge date: 08/25/2023  Attending physician: Willia Craze, MD Final diagnosis: lumbar radiculopathy Findings: L4/5 herniated disc, right L5/S1 foraminal stenosis   Hospital course: Patient is a 67 y.o. female who was admitted after undergoing right L4/5 hemilaminotomy with microdiscectomy and L5/S1 foraminotomy. The patient had significant pain immediately after surgery, but pain eventually was controlled with a multimodal regimen including oxycodone. The patient worked with occupational therapy who recommended discharge to home. The patient was tolerating an oral diet without issue and was voiding spontaneously after surgery. The patient's vitals were stable on the day of discharge.  Her leg pain had improved after the surgery. The patient was medically ready for discharge and was discharge to home on post-operative day one.  Instructions:   Orthopedic Surgery Discharge Instructions  Patient name: Jessica Mack Procedure Performed: right L4/5 hemilaminotomy and discectomy, right L5/S1 foraminotomy Date of Surgery: 08/24/2023 Surgeon: Willia Craze, MD  Pre-operative Diagnosis: lumbar radiculopathy Post-operative Diagnosis: same as above  Discharged to: home Discharge Condition: stable  Activity: You should refrain from bending, lifting, or twisting with objects greater than ten pounds until three months after surgery. You are encouraged to walk as much as desired. You can perform household activities such as cleaning dishes, doing laundry, vacuuming, etc. as long as the ten-pound restriction is followed. You do not need to wear a brace during the post-operative period.   Incision Care: Your incision site has a dressing over it. That dressing should remain in place and dry at all times for a total of one week after surgery. After one week, you can remove the dressing.  Underneath the dressing, you will find pieces of tape. You should leave these pieces of tape in place. They will fall off with time. Do not pick, rub, or scrub at them. Do not put cream or lotion over the surgical area. After one week and once the dressing is off, it is okay to let soap and water run over your incision. Again, do not pick, scrub, or rub at the pieces of tape when bathing. Do not submerge (e.g., take a bath, swim, go in a hot tub, etc.) until six weeks after surgery. There may be some bloody drainage from the incision into the dressing after surgery. This is normal. You do not need to replace the dressing. Continue to leave it in place for the one week as instructed above. Should the dressing become saturated with blood or drainage, please call the office for further instructions.   Medications: You should continue to use your home oxycodone -10 mg up to every 4 hours.  Narcotics can cause constipation as a side effect so you should use your home docusate as needed for constipation.  Continue to use Flexeril that has been previously prescribed twice per day.  You can increase the Flexeril to 3 times a day if needed.  Also continue to use gabapentin 600 mg 3 times per day.  Try to wean off of the oxycodone first.  Continue the Flexeril and gabapentin until our first office visit.  At that visit, we will discuss weaning the Flexeril and gabapentin.  You can use over-the-counter NSAIDs (ibuprofen, Aleve, Celebrex, naproxen, meloxicam, etc.) for additional pain relief after this surgery starting at 72 hours from surgery.   Driving: You should not drive while taking narcotic pain medications. You should start getting back to driving slowly and you may want to  try driving in a parking lot before doing anything more.   Diet: You are safe to resume your regular diet after surgery.   Reasons to Call the Office After Surgery: You should feel free to call the office with any concerns or questions you  have in the post-operative period, but you should definitely notify the office if you develop: -shortness of breath, chest pain, or trouble breathing -excessive bleeding, drainage, redness, or swelling around the surgical site -fevers, chills, or pain that is getting worse with each passing day -persistent nausea or vomiting -new weakness in either leg -new or worsening numbness or tingling in either leg -numbness in the groin, bowel or bladder incontinence -other concerns about your surgery  Follow Up Appointments: You should have an office appointment scheduled for approximately two weeks after surgery. If you do not remember when this appointment is or do not already have it scheduled, please call the office to schedule.   Office Information:  -Willia Craze, MD -Phone number: [phone] -Address: [address]       [place], [place] [address]

## 2023-08-25 NOTE — Evaluation (Signed)
Occupational Therapy Evaluation Patient Details Name: Jessica Mack MRN: [id number] DOB: 05-11-1956 Today's Date: 08/25/2023   History of Present Illness Patient is 67 y.o. female who has been previously seen in the office for right lower extremity pain. Her pain was in an L5 distribution on the right side. Work up revealed a disc herniation at L4/5 and foraminal stenosis on the right at L5/S1.  Pt underwent right L4/5 hemilaminotomy with discectomy, right L5/S1 foraminotomy.   Clinical Impression   Pt currently at supervision level for selfcare tasks and functional mobility using the RW for support.  Able to state 3/3 back precautions but did exhibit some decreased memory with 3 word recall after 2-3 min delay, only stating 2 words.  She also exhibited some word finding difficulty and some confusion.  Per pt's spouse he reports that this happens at times with anesthesia.  Discussed with nursing about confusion and word finding and she reports that it was reported that the MD was aware of it.  Pt will have supervision from spouse at discharge.  No further acute or post acute OT needs or follow-up.        If plan is discharge home, recommend the following: A little help with walking and/or transfers;A little help with bathing/dressing/bathroom;Supervision due to cognitive status;Direct supervision/assist for medications management;Direct supervision/assist for financial management;Other (comment) (Initial supervision needed secondary to confusion)    Functional Status Assessment  Patient has had a recent decline in their functional status and demonstrates the ability to make significant improvements in function in a reasonable and predictable amount of time.  Equipment Recommendations  None recommended by OT       Precautions / Restrictions Precautions Precautions: Back Precaution Booklet Issued: No Precaution Comments: pt able to state 3/3 back precautions Restrictions Weight Bearing  Restrictions: No      Mobility Bed Mobility                    Transfers Overall transfer level: Needs assistance Equipment used: Rolling walker (2 wheels) Transfers: Sit to/from Stand, Bed to chair/wheelchair/BSC Sit to Stand: Supervision     Step pivot transfers: Supervision            Balance Overall balance assessment: Needs assistance Sitting-balance support: Feet supported Sitting balance-Leahy Scale: Good       Standing balance-Leahy Scale: Fair Standing balance comment: UE support needed for mobility.  When attempting steps without, pt with difficulty bearing weight on the RLE to support the left.  Recommendation for RW use at home initially.                           ADL either performed or assessed with clinical judgement   ADL Overall ADL's : Needs assistance/impaired Eating/Feeding: Independent;Sitting   Grooming: Wash/dry hands;Wash/dry face;Supervision/safety;Standing   Upper Body Bathing: Set up;Sitting   Lower Body Bathing: Supervison/ safety;Sit to/from stand   Upper Body Dressing : Set up;Sitting   Lower Body Dressing: Supervision/safety;Sit to/from stand   Toilet Transfer: Supervision/safety;Ambulation;Rolling walker (2 wheels);Comfort height toilet;Grab bars   Toileting- Clothing Manipulation and Hygiene: Supervision/safety;Sit to/from stand   Tub/ Shower Transfer: Supervision/safety;Walk-in shower;Ambulation;Shower seat;Rolling walker (2 wheels)   Functional mobility during ADLs: Supervision/safety;Rolling walker (2 wheels) General ADL Comments: Pt overall supervision for mobility and selfcare tasks sit to stand.  Pt also able to complete steps at supervision level with use of the rail on the right side.  Pt with some confusion and  word finding difficulty which her spouse states is normal for her after anesthesia.  Spouse can provide supervision at home and pt has all needed DME.     Vision Baseline Vision/History: 1  Wears glasses Ability to See in Adequate Light: 0 Adequate Patient Visual Report: No change from baseline Vision Assessment?: No apparent visual deficits     Perception Perception: Within Functional Limits       Praxis Praxis: WFL       Pertinent Vitals/Pain Pain Assessment Pain Assessment: Faces Faces Pain Scale: Hurts a little bit Pain Location: back Pain Descriptors / Indicators: Discomfort Pain Intervention(s): Monitored during session     Extremity/Trunk Assessment Upper Extremity Assessment Upper Extremity Assessment: Overall WFL for tasks assessed (not formally assessed secondary to back precautions.  Pt with recent therapy on left elbow but ROM and functional use WFLs during session.)   Lower Extremity Assessment Lower Extremity Assessment: Defer to PT evaluation   Cervical / Trunk Assessment Cervical / Trunk Assessment: Back Surgery   Communication Communication Communication: Other (comment) (word finding difficult) Cueing Techniques: Verbal cues   Cognition Arousal: Alert Behavior During Therapy: WFL for tasks assessed/performed Overall Cognitive Status: Impaired/Different from baseline Area of Impairment: Memory                               General Comments: Pt able to recall 2/3 words after 2-3 min delay. Exhibits some word finding difficulties as well. Became confused when walking by another room, asking if the person in the room knew her and was wanting her to come in. Once back to her room she had some confusing statements asking her husband if it was him in the other room.                Home Living Family/patient expects to be discharged to:: Private residence Living Arrangements: Spouse/significant other Available Help at Discharge: Available 24 hours/day;Family Type of Home: House Home Access: Stairs to enter [employer] of Steps: 4 Entrance Stairs-Rails: Left;Right Home Layout: Multi-level;One level     Bathroom  Shower/Tub: Producer, television/film/video: Handicapped height     Home Equipment: Agricultural consultant (2 wheels);Rollator (4 wheels);BSC/3in1   Additional Comments: retired      Prior Functioning/Environment Prior Level of Function : Independent/Modified Independent                         AM-PAC OT "6 Clicks" Daily Activity     Outcome Measure Help from another person eating meals?: None Help from another person taking care of personal grooming?: A Little Help from another person toileting, which includes using toliet, bedpan, or urinal?: A Little Help from another person bathing (including washing, rinsing, drying)?: A Little Help from another person to put on and taking off regular upper body clothing?: A Little Help from another person to put on and taking off regular lower body clothing?: A Little 6 Click Score: 19   End of Session Equipment Utilized During Treatment: Rolling walker (2 wheels) Nurse Communication: Mobility status (word finding issues and confusion)  Activity Tolerance: Patient tolerated treatment well;No increased pain Patient left: in chair                   Time: 9604-5409 OT Time Calculation (min): 41 min Charges:  OT General Charges $OT Visit: 1 Visit OT Evaluation $OT Eval Moderate Complexity: 1 Mod OT Treatments $  Self Care/Home Management : 23-37 mins Perrin Maltese, OTR/L Acute Rehabilitation Services  Office [phone] 08/25/2023

## 2023-08-25 NOTE — Op Note (Addendum)
Orthopedic Spine Surgery Operative Report  Procedure: L4/5 microdiscectomy with right-sided hemilaminotomy Right L5/S1 foraminotomy  Modifier: none  Date of procedure: 08/24/2023  Patient name: Jessica Mack MRN: [id number] DOB: [birth year]  Surgeon: Willia Craze, MD Assistant: None Pre-operative diagnosis: L4/5 disc herniation, L5/S1 foraminal stenosis, lumbar radiculopathy Post-operative diagnosis: same as above Findings: L4/5 herniated disc, right L5/S1 foraminal stenosis  Specimens: none Anesthesia: general EBL: 150cc Complications: none Pre-incision antibiotic: clindamycin TXA was given prior to incision as well  Implants: none   Indication for procedure: Patient is a 67 y.o. female who presented to the office with signs and symptoms of lumbar radiculopathy. The patient had tried conservative treatments that did not provide any lasting relief. As result, operative management was discussed. The risks of the surgery discussed included but was not limited to recurrent disc herniation, persistent pain, dural tear, nerve root injury, spinal cord injury, infection, bleeding, fracture, instability, need for additional procedures, blindness, heart attack, stroke, and death. The benefits of the surgery would be faster relief of the her radiating leg pain. Explained that this symptomatic relief is for leg pain and the surgery will not necessarily help any back pain. The alternatives to surgical management were covered with the patient and included activity modification, physical therapy, over-the-counter pain medications, and injections.  All the patient's questions were answered to her satisfaction. After this discussion, the patient expressed understanding and elected to proceed with surgical intervention.  Procedure Description: The patient was met in the pre-operative holding area. The patient's identity and consent were verified. The operative site was marked. The patient's remaining  questions about the surgery were answered. The patient was brought back to the operating room. General anesthesia was induced and an endotracheal tube was placed by the anesthesia staff. The patient was transferred to the prone Valle table in the prone position. All bony prominences were well padded. The head of the bed was slightly elevated and the eyes were free from compression by the face pillow. An electric razor was used to remove any hair over the lumbar region. The surgical area was cleansed with alcohol. Fluoroscopy was then brought in to check rotation on the AP image and to mark the levels on the lateral image. The patient's skin was then prepped and draped in a standard, sterile fashion. A time out was performed that identified the patient, the procedure, and the operative level. All team members agreed with what was stated in the time out.   A midline incision over the spinous processes of the previously marked levels was made and sharp dissection was continued down through the skin and dermis. Electrocautery was then used to continue the midline dissection down to the level of the spinous process. Subperiosteal dissection was performed using electrocautery to expose the lamina out lateral to the facet joint capsule on the right side. Care was taken to not violate the facet joint capsule. A lateral fluoroscopic image was taken to confirm the level. Subperiosteal dissection with electrocautery was then done to expose all the remainder of the lamina and pars interarticularis of L4.  A high-speed matchstick burr was used to thin the hemilamina to the level of the ligamentum flavum. The lamina was thinned with the burr to the edge of the ligamentum flavum insertion. Care was taken to leave at least 8mm of pars interarticularis. A curved curette was used to develop a plane between the ligamentum and the lamina. A series of Kerrison rongeurs were used to remove the thinned lamina to complete  the  laminotomy. A curved curette was used to elevate the ligamentum flavum off of the thecal sac. A combination of Kerrison rongeurs and a pituitary were used to remove the ligamentum flavum overlying the thecal sac and nerve root in the area of the laminotomy.   A penfield was used to mobilize the nerve root. A nerve root retractor was placed into the laminotomy site and around the traversing nerve root to mobilize it medially. The disc herniation was visualized. A long-handle knife was used to create an annulotomy. A pituitary was used to remove the L4/5 herniated disc fragments. A pituitary was used to remove the loose fragments. A nerve hook was placed into the annulotomy to attempt to free any other loose fragments. There were no other loose fragments. A woodsen was used to palpate the disc space and there was no remaining herniation. The disc was in line with the posterior vertebral bodies.   The L5 nerve root was in the base of the wound. A burr was used to thin the crania aspect of the L5 lamina overlying the nerve root. A kerrison was then used over the neve root to clear soft tissue and bone off it as it exited out into the L5/S1 foramen on the right. At this point, a woodsen was used to palpate for any further stenosis. No furtther stenosis was felt.    The wound was copiously irrigated with sterile saline. The fascia was reapproximated with 0 vicryl suture. The subcutaneous fat was reapproximated with 0 vicryl suture. The deep dermal layer was reapproximated with 2-0 vicryl. The skin as closed with a 3-0 running moncryl. All counts were correct at the end of the case. The incision was dressed with steri strips and benzoine. An island dressing was placed over the wound. The patient was transferred back to a bed and brought to the post-anesthesia care unit by anesthesia staff in stable condition.   Post-operative plan: The patient will recover in the post-anesthesia care unit with a plan to go to the  floor. The patient will be out of bed as tolerated with no brace. She will get two post-operative doses of clindamycin. Her disposition will be determined based on how she does on the floor after surgery.    Willia Craze, MD Orthopedic Surgeon

## 2023-08-25 NOTE — Discharge Instructions (Signed)
Orthopedic Surgery Discharge Instructions  Patient name: Jessica Mack Procedure Performed: right L4/5 hemilaminotomy and discectomy, right L5/S1 foraminotomy Date of Surgery: 08/24/2023 Surgeon: Willia Craze, MD  Pre-operative Diagnosis: lumbar radiculopathy Post-operative Diagnosis: same as above  Discharged to: home Discharge Condition: stable  Activity: You should refrain from bending, lifting, or twisting with objects greater than ten pounds until three months after surgery. You are encouraged to walk as much as desired. You can perform household activities such as cleaning dishes, doing laundry, vacuuming, etc. as long as the ten-pound restriction is followed. You do not need to wear a brace during the post-operative period.   Incision Care: Your incision site has a dressing over it. That dressing should remain in place and dry at all times for a total of one week after surgery. After one week, you can remove the dressing. Underneath the dressing, you will find pieces of tape. You should leave these pieces of tape in place. They will fall off with time. Do not pick, rub, or scrub at them. Do not put cream or lotion over the surgical area. After one week and once the dressing is off, it is okay to let soap and water run over your incision. Again, do not pick, scrub, or rub at the pieces of tape when bathing. Do not submerge (e.g., take a bath, swim, go in a hot tub, etc.) until six weeks after surgery. There may be some bloody drainage from the incision into the dressing after surgery. This is normal. You do not need to replace the dressing. Continue to leave it in place for the one week as instructed above. Should the dressing become saturated with blood or drainage, please call the office for further instructions.   Medications: You should continue to use your home oxycodone -10 mg up to every 4 hours.  Narcotics can cause constipation as a side effect so you should use your home docusate as  needed for constipation.  Continue to use Flexeril that has been previously prescribed twice per day.  You can increase the Flexeril to 3 times a day if needed.  Also continue to use gabapentin 600 mg 3 times per day.  Try to wean off of the oxycodone first.  Continue the Flexeril and gabapentin until our first office visit.  At that visit, we will discuss weaning the Flexeril and gabapentin.  You can use over-the-counter NSAIDs (ibuprofen, Aleve, Celebrex, naproxen, meloxicam, etc.) for additional pain relief after this surgery starting at 72 hours from surgery.   Driving: You should not drive while taking narcotic pain medications. You should start getting back to driving slowly and you may want to try driving in a parking lot before doing anything more.   Diet: You are safe to resume your regular diet after surgery.   Reasons to Call the Office After Surgery: You should feel free to call the office with any concerns or questions you have in the post-operative period, but you should definitely notify the office if you develop: -shortness of breath, chest pain, or trouble breathing -excessive bleeding, drainage, redness, or swelling around the surgical site -fevers, chills, or pain that is getting worse with each passing day -persistent nausea or vomiting -new weakness in either leg -new or worsening numbness or tingling in either leg -numbness in the groin, bowel or bladder incontinence -other concerns about your surgery  Follow Up Appointments: You should have an office appointment scheduled for approximately two weeks after surgery. If you do not remember  when this appointment is or do not already have it scheduled, please call the office to schedule.   Office Information:  -Willia Craze, MD -Phone number: [phone] -Address: [address]       [place], [place] [address]

## 2023-08-25 NOTE — Progress Notes (Signed)
Orthopedic Surgery Post-operative Progress Note  Assessment: Patient is a 67 y.o. female who is currently admitted after undergoing right L4/5 hemilaminotomy with discectomy, right L5/S1 foraminotomy   Plan: -Operative plans complete -Drains: none -Out of bed as tolerated, no brace -No bending/lifting/twisting greater than 10 pounds -OT evaluate and treat -Pain control -Regular diet -No chemoprophylaxis for dvt or antiplatelets for 72 hours after surgery -Clindamycin x2 post-operative doses -Anticipate discharge to home today  ___________________________________________________________________________   Subjective: Feeling more like herself this morning. She has been able to ambulate around the room. She is sitting in a chair this morning and right leg pain is better. Back pain is under control with current medications.    Objective:  General: no acute distress, appropriate affect Neurologic: alert, answering questions appropriately, following commands Respiratory: unlabored breathing on room air Skin: dressing clear/dry/intact  MSK (spine):  -Strength exam      Right  Left  EHL    4/5  5/5 TA    4/5  5/5 GSC    5/5  5/5 Knee extension  5/5  5/5 Hip flexion   5/5  5/5  -Sensory exam    Sensation intact to light touch in L3-S1 nerve distributions of bilateral lower extremities (decreased in bilateral feet from chronic neuropathy)   Patient name: Jessica Mack Patient MRN: [id number] Date: 08/25/23

## 2023-08-25 NOTE — Plan of Care (Signed)
  Problem: Education: Goal: Knowledge of General Education information will improve Description Including pain rating scale, medication(s)/side effects and non-pharmacologic comfort measures Outcome: Progressing   

## 2023-08-27 ENCOUNTER — Encounter: Payer: PPO | Admitting: Rehabilitative and Restorative Service Providers"

## 2023-08-30 ENCOUNTER — Other Ambulatory Visit (HOSPITAL_COMMUNITY): Payer: PPO

## 2023-09-02 ENCOUNTER — Other Ambulatory Visit: Payer: Self-pay | Admitting: Internal Medicine

## 2023-09-02 DIAGNOSIS — G4762 Sleep related leg cramps: Secondary | ICD-10-CM

## 2023-09-02 DIAGNOSIS — G620 Drug-induced polyneuropathy: Secondary | ICD-10-CM

## 2023-09-06 ENCOUNTER — Other Ambulatory Visit: Payer: Self-pay | Admitting: Orthopedic Surgery

## 2023-09-06 DIAGNOSIS — M5116 Intervertebral disc disorders with radiculopathy, lumbar region: Secondary | ICD-10-CM

## 2023-09-08 ENCOUNTER — Emergency Department (HOSPITAL_COMMUNITY): Payer: PPO

## 2023-09-08 ENCOUNTER — Ambulatory Visit (INDEPENDENT_AMBULATORY_CARE_PROVIDER_SITE_OTHER): Payer: PPO | Admitting: Orthopedic Surgery

## 2023-09-08 ENCOUNTER — Encounter (HOSPITAL_COMMUNITY): Payer: Self-pay

## 2023-09-08 ENCOUNTER — Encounter: Payer: PPO | Admitting: Orthopedic Surgery

## 2023-09-08 ENCOUNTER — Emergency Department (HOSPITAL_COMMUNITY)
Admission: EM | Admit: 2023-09-08 | Discharge: 2023-09-08 | Disposition: A | Discharge status: Home / Self Care | Payer: PPO | Attending: Emergency Medicine | Admitting: Emergency Medicine

## 2023-09-08 ENCOUNTER — Other Ambulatory Visit: Payer: Self-pay

## 2023-09-08 DIAGNOSIS — Z9889 Other specified postprocedural states: Secondary | ICD-10-CM | POA: Diagnosis not present

## 2023-09-08 DIAGNOSIS — R799 Abnormal finding of blood chemistry, unspecified: Secondary | ICD-10-CM | POA: Diagnosis not present

## 2023-09-08 DIAGNOSIS — R4182 Altered mental status, unspecified: Secondary | ICD-10-CM | POA: Diagnosis present

## 2023-09-08 DIAGNOSIS — I959 Hypotension, unspecified: Secondary | ICD-10-CM | POA: Insufficient documentation

## 2023-09-08 LAB — COMPREHENSIVE METABOLIC PANEL
ALT: 42 U/L (ref 0–44)
AST: 28 U/L (ref 15–41)
Albumin: 3.2 g/dL — ABNORMAL LOW (ref 3.5–5.0)
Alkaline Phosphatase: 89 U/L (ref 38–126)
Anion gap: 11 (ref 5–15)
BUN: 18 mg/dL (ref 8–23)
CO2: 20 mmol/L — ABNORMAL LOW (ref 22–32)
Calcium: 8.9 mg/dL (ref 8.9–10.3)
Chloride: 109 mmol/L (ref 98–111)
Creatinine, Ser: 0.85 mg/dL (ref 0.44–1.00)
GFR, Estimated: >60 mL/min (ref >60)
Glucose, Bld: 87 mg/dL (ref 70–99)
Potassium: 4.5 mmol/L (ref 3.5–5.1)
Sodium: 140 mmol/L (ref 135–145)
Total Bilirubin: 0.7 mg/dL (ref <1.2)
Total Protein: 6.1 g/dL — ABNORMAL LOW (ref 6.5–8.1)

## 2023-09-08 LAB — CBG MONITORING, ED: Glucose-Capillary: 98 mg/dL (ref 70–99)

## 2023-09-08 LAB — CBC WITH DIFFERENTIAL/PLATELET
Abs Immature Granulocytes: 0.03 10*3/uL (ref 0.00–0.07)
Basophils Absolute: 0 10*3/uL (ref 0.0–0.1)
Basophils Relative: 0 %
Eosinophils Absolute: 0.2 10*3/uL (ref 0.0–0.5)
Eosinophils Relative: 3 %
HCT: 31.8 % — ABNORMAL LOW (ref 36.0–46.0)
Hemoglobin: 9.9 g/dL — ABNORMAL LOW (ref 12.0–15.0)
Immature Granulocytes: 0 %
Lymphocytes Relative: 8 %
Lymphs Abs: 0.6 10*3/uL — ABNORMAL LOW (ref 0.7–4.0)
MCH: 32.1 pg (ref 26.0–34.0)
MCHC: 31.1 g/dL (ref 30.0–36.0)
MCV: 103.2 fL — ABNORMAL HIGH (ref 80.0–100.0)
Monocytes Absolute: 0.6 10*3/uL (ref 0.1–1.0)
Monocytes Relative: 8 %
Neutro Abs: 6.1 10*3/uL (ref 1.7–7.7)
Neutrophils Relative %: 81 %
Platelets: 204 10*3/uL (ref 150–400)
RBC: 3.08 MIL/uL — ABNORMAL LOW (ref 3.87–5.11)
RDW: 13.9 % (ref 11.5–15.5)
WBC: 7.6 10*3/uL (ref 4.0–10.5)
nRBC: 0 % (ref 0.0–0.2)

## 2023-09-08 MED ORDER — NALOXONE HCL 0.4 MG/ML IJ SOLN
0.4000 mg | Freq: Once | INTRAMUSCULAR | Status: AC
  Administered 2023-09-08: 0.4 mg via INTRAVENOUS
  Filled 2023-09-08: qty 1

## 2023-09-08 MED ORDER — LACTATED RINGERS IV BOLUS
1000.0000 mL | Freq: Once | INTRAVENOUS | Status: AC
  Administered 2023-09-08: 1000 mL via INTRAVENOUS

## 2023-09-08 NOTE — Progress Notes (Signed)
Orthopedic Surgery Post-operative Office Visit  Procedure: right L4/5 hemilaminotomy and microdiscectomy, right L5/S1 foraminotomy Date of Surgery: 08/24/2023 (~2 weeks post-op)  Assessment: Patient is a 67 y.o. who has noted significant improvement in her radiating leg pain with the surgery.  However, was lethargic and developed lightheadedness at the end of her visit.  She was noted to be hypotensive   Plan: -Operative plans complete -Out of bed as tolerated, no brace -No bending/lifting/twisting greater than 10 pounds -Provided her with a schedule to wean off of the gabapentin and the Flexeril.  By her next visit, I expect her to be down to 300 mg 3 times daily and taking Flexeril once.  From there, will continue to wean -At the end of the visit, patient was lightheaded and had difficulty ambulating out of the office due to unsteadiness.  Blood pressure was obtained and was in the 60s systolic.  EMS was called and got a similar systolic blood pressure.  Blood glucose was in the low 100s.  Given the hypotension, it was recommended that she be evaluated in the ER.  She was brought there today -Return to office in 4 weeks, lumbar x-rays needed at next visit: None  ___________________________________________________________________________   Subjective: Patient has been doing well since surgery.  Leg pain has gotten significantly better.  Still has some numbness and paresthesias distally in her lower extremities from neuropathy.  Has been weaning her medications.  Is no longer taking any narcotics.  Still taking Flexeril and gabapentin.  Objective:  General: no acute distress, lethargic Neurologic: answering questions and following pants with repeat instruction Respiratory: unlabored breathing on room air Skin: incision appears to be healing as appropriately with no evidence of infection, no drainage  MSK (spine):  -Strength exam      Left  Right  EHL    5/5  4/5 TA     5/5  4/5 GSC    5/5  4/5 Knee extension  5/5  5/5 Hip flexion   5/5  5/5  -Sensory exam    Sensation intact to light touch in L3-S1 nerve distributions of bilateral lower extremities  Imaging: None obtained today   Patient name: Jessica Mack Patient MRN: [id number] Date of visit: 09/08/23

## 2023-09-08 NOTE — ED Provider Notes (Signed)
Received patient in turnover from Dr. Doran Durand.  Please see their note for further details of Hx, PE.  Briefly patient is a 67 y.o. female with a Hypotension .  Patient found to be altered, thought to be secondary to polypharmacy.  Improvement with narcan.  Awaiting labs, reassess.  Patient is now awake alert and ambulatory.  Her blood pressure is improved with decrease sedation.  Discussed the lab results with her.  Will have her follow-up with her spinal surgeon and PCP in the office.    Melene Plan, DO 09/08/23 1722

## 2023-09-08 NOTE — ED Triage Notes (Signed)
Pt arrived via [facility] from 2wk follow-up appt s/p L4, L5, S1 discectomy. Pt became lightheaded, pale, lethargic and BP was noted to be 46/35. EMS placed IV gave NS bolus and BP was 80/50. Other VSS.

## 2023-09-08 NOTE — Discharge Instructions (Addendum)
Please decrease gabapentin, Flexeril.

## 2023-09-08 NOTE — ED Provider Notes (Signed)
[place] EMERGENCY DEPARTMENT AT [facility] Provider Note   CSN: [id number] Arrival date & time: 09/08/23  1246     History Chief Complaint  Patient presents with   Hypotension    HPI Jessica Mack is a 67 y.o. female presenting for chief complaint of altered mental status. Recent orthopedic surgery.  Back surgery.  History of polyneuropathy, hypertension, nonalcoholic steatohepatitis, HLD. Was brought from her orthopedic surgery appointment for hypotension 70s over 40s and altered mental status. She took medications prior to arriving at this appointment. Patient states that she felt normal yesterday. She denies fevers chills nausea vomiting shortness of breath. Patient's recorded medical, surgical, social, medication list and allergies were reviewed in the Snapshot window as part of the initial history.   Review of Systems   Review of Systems  Constitutional:  Negative for chills and fever.  HENT:  Negative for ear pain and sore throat.   Eyes:  Negative for pain and visual disturbance.  Respiratory:  Negative for cough and shortness of breath.   Cardiovascular:  Negative for chest pain and palpitations.  Gastrointestinal:  Negative for abdominal pain and vomiting.  Genitourinary:  Negative for dysuria and hematuria.  Musculoskeletal:  Negative for arthralgias and back pain.  Skin:  Negative for color change and rash.  Neurological:  Negative for seizures and syncope.  All other systems reviewed and are negative.   Physical Exam Updated Vital Signs BP 93/68   Pulse 71   Temp (!) 97.3 F (36.3 C) (Oral)   Resp 14   Ht 5\' 3"  (1.6 m)   Wt 64.9 kg   LMP 06/26/2004   SpO2 99%   BMI 25.33 kg/m  Physical Exam Constitutional:      General: She is not in acute distress.    Appearance: She is not ill-appearing or toxic-appearing.  HENT:     Head: Normocephalic and atraumatic.  Eyes:     Extraocular Movements: Extraocular movements intact.     Pupils:  Pupils are equal, round, and reactive to light.  Cardiovascular:     Rate and Rhythm: Normal rate.  Pulmonary:     Effort: No respiratory distress.  Abdominal:     General: Abdomen is flat.  Musculoskeletal:        General: No swelling, deformity or signs of injury.     Cervical back: Normal range of motion. No rigidity.  Skin:    General: Skin is warm and dry.  Neurological:     General: No focal deficit present.     Mental Status: She is alert.     Comments: Patient requires repeat stimulation to get answers to questions.  She is inconsistent in her answers.  No acute distress neurologically intact.  Psychiatric:        Mood and Affect: Mood normal.      ED Course/ Medical Decision Making/ A&P    Procedures Procedures   Medications Ordered in ED Medications  naloxone [facility]) injection 0.4 mg (0.4 mg Intravenous Given 09/08/23 1313)  lactated ringers bolus 1,000 mL (1,000 mLs Intravenous New Bag/Given 09/08/23 1309)    Medical Decision Making:    Venus Gregorek is a 67 y.o. female who presented to the ED today with hypotension and altered mental status detailed above.     Handoff received from EMS.  Patient placed on continuous vitals and telemetry monitoring while in ED which was reviewed periodically.   Complete initial physical exam performed, notably the patient  was GCS  14.  She requires repeat prompting for questions.  She is in no acute distress remains mildly hypotensive 80s over 50s started on IV fluids and treatment with Narcan due to 1 mm pupils..      Reviewed and confirmed nursing documentation for past medical history, family history, social history.    Initial Assessment:   This is most consistent with an acute life/limb threatening illness complicated by underlying chronic conditions. Patient appears to have a clinical overdose.  Likely of her medications.  She appears to be on Klonopin, Flexeril, oxycodone, gabapentin.  Initial plan: Observation  and IV fluid for hypotension Screening labs including CBC and Metabolic panel to evaluate for infectious or metabolic etiology of disease.  Urinalysis with reflex culture ordered to evaluate for UTI or relevant urologic/nephrologic pathology.  CXR to evaluate for structural/infectious intrathoracic pathology.  EKG to evaluate for cardiac pathology. Objective evaluation as below reviewed with plan for close reassessment Reassessment and Plan:   Blood pressure is improved with IV fluids.  X-rays are still pending.  Lab work is still pending.  Given improving vitals I believe patient will likely be stable for outpatient pending lab work. Care patient handed off to oncoming team at this time pending reassessment.   Clinical Impression:  1. Altered mental status, unspecified altered mental status type      Data Unavailable   Final Clinical Impression(s) / ED Diagnoses Final diagnoses:  Altered mental status, unspecified altered mental status type    Rx / DC Orders ED Discharge Orders     None         Glyn Ade, MD 09/08/23 1513

## 2023-09-09 ENCOUNTER — Telehealth: Payer: Self-pay | Admitting: Orthopedic Surgery

## 2023-09-09 NOTE — Telephone Encounter (Signed)
[name] with [facility] called stating that pt is having a hard time with taking the gabapentin tablets. They said they would switch to 300mg  capsules for her and still be able to keep her on her tapering schedule to wean off.

## 2023-09-16 MED ORDER — METHYLPREDNISOLONE 4 MG PO TBPK: ORAL_TABLET | ORAL | 0 refills | Status: DC

## 2023-09-16 NOTE — Addendum Note (Signed)
Addended by: Willia Craze on: 09/16/2023 05:33 PM   Modules accepted: Orders

## 2023-09-20 ENCOUNTER — Encounter: Payer: Self-pay | Admitting: Orthopedic Surgery

## 2023-09-20 ENCOUNTER — Encounter: Payer: PPO | Admitting: Orthopedic Surgery

## 2023-09-27 ENCOUNTER — Other Ambulatory Visit: Payer: Self-pay

## 2023-09-27 ENCOUNTER — Ambulatory Visit: Payer: PPO | Admitting: Physical Therapy

## 2023-09-27 DIAGNOSIS — M5459 Other low back pain: Secondary | ICD-10-CM

## 2023-09-27 DIAGNOSIS — M6281 Muscle weakness (generalized): Secondary | ICD-10-CM

## 2023-09-27 DIAGNOSIS — R262 Difficulty in walking, not elsewhere classified: Secondary | ICD-10-CM

## 2023-09-27 NOTE — Therapy (Signed)
OUTPATIENT PHYSICAL THERAPY THORACOLUMBAR EVALUATION   Patient Name: Jessica Mack MRN: [id number] DOB:[birth year], 67 y.o., female Today's Date: 09/27/2023  END OF SESSION:  PT End of Session - 09/27/23 1537     Visit Number 1    Number of Visits 8    Date for PT Re-Evaluation 11/08/23    Progress Note Due on Visit 10    PT Start Time 1345    PT Stop Time 1430    PT Time Calculation (min) 45 min    Activity Tolerance Patient tolerated treatment well;Patient limited by pain    Behavior During Therapy [facility] for tasks assessed/performed              Past Medical History:  Diagnosis Date   Avascular necrosis of bone (HCC)    Left talus   Blood transfusion    Breast cancer (HCC) 2005   left   Cancer (HCC)    Dysplastic nevus 1988   right hip   Fatty liver    GERD (gastroesophageal reflux disease)    History of breast cancer 2005   invasive ductal cancer left breast   Hypertension    Hypertriglyceridemia    Irritable bowel syndrome    Metabolic syndrome    resolved with diet and exercise   NASH (nonalcoholic steatohepatitis)    Nocturnal leg cramps 02/14/2015   Osteopenia    Palpitations 09/2012   normal echo   Peripheral neuropathy    Personal history of chemotherapy    Personal history of radiation therapy    Plantar fasciitis 03/2003   PONV (postoperative nausea and vomiting)    Tongue dysplasia    Ulcer    Past Surgical History:  Procedure Laterality Date   ANKLE SURGERY  07/2007   left, revascularization   BREAST LUMPECTOMY  06/2004   left, with port placement   CESAREAN SECTION  1991, 1993   x2   COLONOSCOPY  09/2013   Dr. Loreta Ave   DG GALL BLADDER     IRRIGATION AND DEBRIDEMENT ELBOW Left 05/04/2023   Procedure: LEFT ELBOW IRRIGATION AND DEBRIDEMENT;  Surgeon: [name], MD;  Location: [facility];  Service: Orthopedics;  Laterality: Left;   LAPAROSCOPIC CHOLECYSTECTOMY  2003   LUMBAR LAMINECTOMY Right 08/24/2023   Procedure:  LUMBAR FOUR-FIVE MICRODISCECTOMY, LUMBAR FIVE-SACRAL ONE RIGHT FORAMINOTOMY;  Surgeon: London Sheer, MD;  Location: [facility] OR;  Service: Orthopedics;  Laterality: Right;   MOUTH SURGERY  11/15   implant/crown placed 4/16   SHOULDER ARTHROSCOPY  10/2005   right   SHOULDER SURGERY  12/15   left   TONGUE SURGERY  2012, 2013   WRIST SURGERY  1988   right, DeQuervains   Patient Active Problem List   Diagnosis Date Noted   Radiculopathy, lumbar region 08/24/2023   Elevated ferritin level 04/05/2023   NASH (nonalcoholic steatohepatitis) 06/04/2020   Hypertriglyceridemia    Routine general medical examination at a health care facility 02/20/2020   Essential hypertension 02/20/2020   Osteopenia 08/05/2014   Breast cancer of upper-outer quadrant of left female breast (HCC) 07/20/2013   Leukoplakia of oral mucosa, including tongue 07/26/2012   Essential and other specified forms of tremor 07/26/2012   Drug-induced polyneuropathy (HCC) 07/26/2012    PCP: Etta Grandchild, MD  REFERRING PROVIDER: London Sheer, MD   REFERRING DIAG: Diagnosis M54.16 (ICD-10-CM) - Lumbar radiculopathy  Rationale for Evaluation and Treatment: Rehabilitation  THERAPY DIAG:  Other low back pain  Muscle weakness (generalized)  Difficulty  in walking, not elsewhere classified  ONSET DATE:08/24/23 L4/5 microdiscectomy with right-sided hemilaminotomy, Right L5/S1 foraminotomy  SUBJECTIVE:                                                                                                                                                                                           SUBJECTIVE STATEMENT: She is 5 weeks post op lumbar surgery, the sharp electric pains going down her Rt leg have improved. She has had left leg symptoms recently. She feels she has proximal weakness and her gait is off and can't do much activity since surgery. She feels stiffness in her calves and hamstrings. She has been fearful to do any  activity since surgery.  PERTINENT HISTORY:  Recent back surgery Left talus avascular necrosis, breast cancer, peripheral neuropathy, ankle, shoulder and wrist surgeries, left elbow olecranon bursectomy  PAIN:  Are you having pain? Yes: NPRS scale: 4-5/10 this week on a 10/10 Pain location: glutes and down legs Pain description: Ache going down her legs  Aggravating factors: Standing  or stairs without support, lying prone Relieving factors: Heat, flexing he legs up  PRECAUTIONS: Back  RED FLAGS: None   WEIGHT BEARING RESTRICTIONS: No  FALLS:  Has patient fallen in last 6 months? Yes. Number of falls 1 with pickle ball   OCCUPATION: Retired Tourist information centre manager, very physically active but has not been able to do anything active since surgery  PLOF: Independent  PATIENT GOALS: be active again without pain limiting her return to walking, Pilates and pickle ball   OBJECTIVE:  Note: Objective measures were completed at Evaluation unless otherwise noted.  DIAGNOSTIC FINDINGS:    PATIENT SURVEYS:  Eval FOTO 34% functional, goal is 54%  SCREENING FOR RED FLAGS: Bowel or bladder incontinence: No   COGNITION: Overall cognitive status: Within functional limits for tasks assessed      MUSCLE LENGTH: Hamstrings:moderately tight bilat    LUMBAR ROM:   AROM 09/27/2023  Flexion 90%  Extension 25%  Right lateral flexion 50%  Left lateral flexion 50%  Right rotation 50%  Left rotation 50%   (Blank rows = not tested)   STRENGTH:   STRENGTH MMT in sitting Left/Right 09/27/2023   Hip flexion 3+/4   Hip extension    Hip abduction 4/4   Hip adduction    Hip internal rotation    Hip external rotation    Knee flexion 4+/4+   Knee extension 4+/4+   Ankle dorsiflexion 4+/4+   Ankle plantarflexion 4+/4+   Ankle inversion    Ankle eversion    Lumbar extension     (Blank rows = not tested)  GAIT: Eval: antalgic gait with limited distances due to pain, wider Base of  support noted  TODAY'S TREATMENT:  Eval HEP creation and review with demonstration and trial set preformed, see below for details Long axis distraction 30 sec X 4 and manual single knee to chest stretching 30 sec X4 Nu step L5 X 10 min UE/LE    PATIENT EDUCATION: Education details: HEP, PT plan of care Person educated: Patient Education method: Explanation, Demonstration, Verbal cues, and Handouts Education comprehension: verbalized understanding and needs further education   HOME EXERCISE PROGRAM: Access Code: [email or web address] URL: [email or web address] Date: 09/27/2023 Prepared by: Ivery Quale  Exercises - Modified [place] Pian (Mirrored)  - 2 x daily - 6 x weekly - 1 sets - 10 reps - 10 hold - straight leg raise with help from strap  - 2 x daily - 6 x weekly - 1 sets - 3 reps - 20 sec hold - Supine Lower Trunk Rotation  - 2 x daily - 6 x weekly - 1 sets - 5 reps - 10 sec hold - Hooklying Single Knee to Chest Stretch  - 2 x daily - 6 x weekly - 1 sets - 3 reps - 20 sec hold - Standing Marching  - 2 x daily - 6 x weekly - 1-2 sets - 10 reps - Standing Hip Abduction with Counter Support  - 2 x daily - 6 x weekly - 1-2 sets - 10 reps - Standing Hip Extension with Counter Support  - 2 x daily - 6 x weekly - 1-2 sets - 10 reps - Standing Lumbar Extension  - 2 x daily - 6 x weekly - 1-2 sets - 10 reps - 5 hold  ASSESSMENT:  CLINICAL IMPRESSION: Patient referred to PT for lumbar radiculopathy, she is S/P L4/5 microdiscectomy with right-sided hemilaminotomy, Right L5/S1 foraminotomy 08/24/23. She has decreased lumbar ROM, decreased leg strength, difficulty with walking, and overall deconditioning since surgery. Patient will benefit from skilled PT to address below impairments, limitations and improve overall function.  OBJECTIVE IMPAIRMENTS: decreased activity tolerance, difficulty walking, decreased balance, decreased endurance, decreased mobility, decreased ROM, decreased  strength, impaired flexibility, impaired LE use, postural dysfunction, and pain.  ACTIVITY LIMITATIONS: bending, lifting, carry, locomotion, cleaning, community activity, driving  PERSONAL FACTORS: post op status are also affecting patient's functional outcome.  REHAB POTENTIAL: Good  CLINICAL DECISION MAKING: Stable/uncomplicated  EVALUATION COMPLEXITY: Low    GOALS: Short term PT Goals Target date: 10/25/2023  Pt will be I and compliant with HEP. Baseline:  Goal status: New Pt will decrease pain by 25% overall Baseline: Goal status: New  Long term PT goals Target date:11/08/2023   Pt will improve lumbar AROM to [facility] to improve functional mobility Baseline: Goal status: New Pt will improve  hip/knee strength to at least 5/5 MMT ins sitting to improve functional strength Baseline: Goal status: New Pt will improve FOTO to at least 54% functional to show improved function Baseline: Goal status: New Pt will reduce pain to overall less than 2-3/10 with usual activity and work activity. Baseline: Goal status: New Pt will be able to ambulate community distances at least 1000 ft WNL gait pattern without complaints Baseline: Goal status: New  PLAN: PT FREQUENCY: 1-2 times per week   PT DURATION: 4-6 weeks  PLANNED INTERVENTIONS (unless contraindicated): aquatic PT, Canalith repositioning, cryotherapy, Electrical stimulation, Iontophoresis with 4 mg/ml dexamethasome, Moist heat, traction, Ultrasound, gait training, Therapeutic exercise, balance training, neuromuscular re-education, patient/family education, , manual techniques, passive ROM, dry  needling, taping, joint manipulations 97110-Therapeutic exercises, 97530- Therapeutic activity, [id number]- Neuromuscular re-education, 97535- Self Care, and [id number]- Manual therapy  PLAN FOR NEXT SESSION: review and update HEP PRN,lumbar mobility, general leg strength and conditioning.  NEXT MD VISIT: Dr. Christell Constant 12/122024  April Manson,  PT, DPT 09/27/2023, 3:38 PM

## 2023-09-28 ENCOUNTER — Encounter: Payer: Self-pay | Admitting: Orthopedic Surgery

## 2023-09-28 ENCOUNTER — Encounter: Payer: Self-pay | Admitting: Internal Medicine

## 2023-09-28 ENCOUNTER — Ambulatory Visit: Payer: PPO | Admitting: Internal Medicine

## 2023-09-28 VITALS — BP 122/86 | HR 79 | Temp 97.7°F | Ht 63.0 in | Wt 147.6 lb

## 2023-09-28 DIAGNOSIS — D649 Anemia, unspecified: Secondary | ICD-10-CM | POA: Diagnosis not present

## 2023-09-28 DIAGNOSIS — Z23 Encounter for immunization: Secondary | ICD-10-CM

## 2023-09-28 DIAGNOSIS — I10 Essential (primary) hypertension: Secondary | ICD-10-CM

## 2023-09-28 NOTE — Patient Instructions (Signed)

## 2023-09-28 NOTE — Progress Notes (Signed)
Subjective:  Patient ID: Jessica Mack, female    DOB: 01/07/1956  Age: 67 y.o. MRN: [id number]  CC: Anemia   HPI Jessica Mack presents for f/up ---  Discussed the use of AI scribe software for clinical note transcription with the patient, who gave verbal consent to proceed.  History of Present Illness   The patient, with a history of disc herniation and subsequent surgery, presents with persistent left-sided pain and mobility issues. She reports that the pain began after completing physical therapy for a previous condition. Despite undergoing surgery five weeks ago, the patient continues to experience significant discomfort. She has completed multiple courses of oral steroids and two injections, which provided some relief but did not fully alleviate the symptoms. The patient also reports difficulty walking and transferring from a wheelchair, which has significantly impacted her daily activities.  In addition to the disc herniation, the patient has been dealing with anemia. The onset of anemia was sudden and unexpected, with the patient first noticing it after a visit to the emergency room due to severe pain that prevented her from walking or bearing weight. Subsequent blood tests have shown fluctuating hemoglobin levels, with periods of normalcy interspersed with episodes of anemia. The patient denies experiencing any symptoms typically associated with anemia, such as dizziness or lightheadedness, but does report occasional difficulty with word finding and a sense of 'spaciness.'  The patient has been attempting to manage her symptoms with gabapentin and Flexeril, both of which she is currently trying to wean off. She has also been participating in physical therapy and Pilates to improve her mobility. Despite these efforts, the patient expresses frustration with her slow progress and the impact of her condition on her quality of life.       Outpatient Medications Prior to Visit   Medication Sig Dispense Refill   acetaminophen (TYLENOL) 500 MG tablet Take 1,500 mg by mouth as needed.     clonazePAM (KLONOPIN) 1 MG tablet TAKE 1 TABLET BY MOUTH AT BEDTIME 90 tablet 0   Estradiol 10 MCG TABS vaginal tablet 1 tablet vaginally twice weekly (Patient taking differently: 1 tablet vaginally weekly) 24 tablet 3   famciclovir (FAMVIR) 500 MG tablet 3 tablets (1500mg ) at onset of fever blister symptoms 9 tablet 4   famotidine (PEPCID) 20 MG tablet Take 20 mg by mouth daily.     gabapentin (NEURONTIN) 600 MG tablet TAKE 1 TABLET BY MOUTH 3 TIMES A DAY 90 tablet 0   Multiple Vitamins-Minerals (MULTIVITAMIN PO) Take 1 tablet by mouth daily. 50+     nebivolol (BYSTOLIC) 5 MG tablet TAKE 1 TABLET BY MOUTH DAILY 90 tablet 1   olmesartan (BENICAR) 5 MG tablet Take 2 tablets (10 mg total) by mouth daily. 180 tablet 1   omega-3 acid ethyl esters (LOVAZA) 1 g capsule TAKE TWO CAPSULES BY MOUTH TWICE DAILY 360 capsule 1   Probiotic Product (ALIGN PO) Take 1 tablet by mouth daily.     tirzepatide [facility]) 5 MG/0.5ML Pen Inject 5 mg into the skin once a week.     cyclobenzaprine (FLEXERIL) 10 MG tablet Take 1 tablet (10 mg total) by mouth 3 (three) times daily as needed for muscle spasms. (Patient taking differently: Take 10 mg by mouth 2 (two) times daily as needed for muscle spasms.) 60 tablet 1   methylPREDNISolone (MEDROL DOSEPAK) 4 MG TBPK tablet Take as prescribed on the box 21 tablet 0   oxycodone (OXY-IR) 5 MG capsule Take 5 mg by  mouth every 4 (four) hours as needed for pain.     tirzepatide (ZEPBOUND) 15 MG/0.5ML Pen Inject 15 mg into the skin once a week. 2 mL 6   No facility-administered medications prior to visit.    ROS Review of Systems  Constitutional: Negative.  Negative for diaphoresis and fatigue.  HENT: Negative.    Eyes: Negative.   Respiratory:  Negative for cough, chest tightness, shortness of breath and wheezing.   Cardiovascular:  Negative for chest pain,  palpitations and leg swelling.  Gastrointestinal:  Negative for abdominal pain, blood in stool, constipation, diarrhea, nausea and vomiting.  Endocrine: Negative.   Genitourinary: Negative.  Negative for difficulty urinating, dysuria and hematuria.  Musculoskeletal:  Positive for back pain. Negative for myalgias.  Skin: Negative.   Neurological:  Negative for dizziness and weakness.  Hematological:  Negative for adenopathy. Does not bruise/bleed easily.  Psychiatric/Behavioral: Negative.      Objective:  BP 122/86 (BP Location: Left Arm, Patient Position: Sitting, Cuff Size: Normal)   Pulse 79   Temp 97.7 F (36.5 C) (Oral)   Ht 5\' 3"  (1.6 m)   Wt 147 lb 9.6 oz (67 kg)   LMP 06/26/2004   SpO2 93%   BMI 26.15 kg/m   BP Readings from Last 3 Encounters:  09/28/23 122/86  09/08/23 96/68  08/25/23 138/81    Wt Readings from Last 3 Encounters:  09/28/23 147 lb 9.6 oz (67 kg)  09/08/23 143 lb (64.9 kg)  08/24/23 143 lb (64.9 kg)    Physical Exam Vitals reviewed.  Constitutional:      Appearance: Normal appearance.  HENT:     Mouth/Throat:     Mouth: Mucous membranes are moist.  Eyes:     General: No scleral icterus.    Conjunctiva/sclera: Conjunctivae normal.  Cardiovascular:     Rate and Rhythm: Normal rate and regular rhythm.     Heart sounds: No murmur heard. Pulmonary:     Breath sounds: No stridor. No wheezing, rhonchi or rales.  Abdominal:     General: Abdomen is flat.     Palpations: There is no mass.     Tenderness: There is no abdominal tenderness. There is no guarding.     Hernia: No hernia is present.  Musculoskeletal:        General: Normal range of motion.     Cervical back: Neck supple.     Right lower leg: No edema.     Left lower leg: No edema.  Lymphadenopathy:     Cervical: No cervical adenopathy.  Skin:    General: Skin is warm and dry.  Neurological:     General: No focal deficit present.     Mental Status: She is alert. Mental status is  at baseline.  Psychiatric:        Mood and Affect: Mood normal.        Behavior: Behavior normal.     Lab Results  Component Value Date   WBC 8.1 09/30/2023   HGB 12.6 09/30/2023   HCT 35.6 (L) 09/30/2023   PLT 338.0 09/30/2023   GLUCOSE 101 (H) 09/30/2023   CHOL 224 (H) 04/02/2023   TRIG 150.0 (H) 04/02/2023   HDL 45.50 04/02/2023   LDLDIRECT 116.0 05/31/2020   LDLCALC 149 (H) 04/02/2023   ALT 42 09/08/2023   AST 28 09/08/2023   NA 140 09/30/2023   K 4.3 09/30/2023   CL 105 09/30/2023   CREATININE 0.63 09/30/2023   BUN 24 (H) 09/30/2023  CO2 26 09/30/2023   TSH 0.61 09/30/2023   INR 1.1 (H) 04/02/2023   HGBA1C 5.5 04/02/2023    DG Chest Portable 1 View  Result Date: 09/08/2023 CLINICAL DATA:  Altered mental status. EXAM: PORTABLE CHEST 1 VIEW COMPARISON:  09/28/2022. FINDINGS: Low lung volume. There is blunting of left lateral costophrenic angle, which may represent small left pleural effusion. There are probable associated atelectatic changes at the left lung base. Bilateral lung fields are otherwise clear. Right lateral costophrenic angle is clear. Normal cardio-mediastinal silhouette. No acute osseous abnormalities. The soft tissues are within normal limits. IMPRESSION: *Small left pleural effusion with probable associated atelectatic changes at the left lung base. Electronically Signed   By: Jules Schick M.D.   On: 09/08/2023 16:04    Assessment & Plan:  Need for pneumococcal vaccine -     Pneumococcal conjugate vaccine 20-valent  Anemia, unspecified type- Her H/H have improved. -     TSH; Future -     Basic metabolic panel; Future -     CBC with Differential/Platelet; Future -     Reticulocytes; Future -     CBC with Differential/Platelet; Future -     Basic metabolic panel; Future -     Reticulocytes; Future  Essential hypertension - Her BP is well controlled. -     TSH; Future -     Basic metabolic panel; Future -     TSH; Future -     Basic metabolic  panel; Future     Follow-up: Return in about 6 months (around 03/28/2024).  Sanda Linger, MD

## 2023-09-29 MED ORDER — CYCLOBENZAPRINE HCL 10 MG PO TABS: 10.0000 mg | ORAL_TABLET | Freq: Two times a day (BID) | ORAL | 0 refills | Status: DC | PRN

## 2023-09-30 ENCOUNTER — Other Ambulatory Visit (INDEPENDENT_AMBULATORY_CARE_PROVIDER_SITE_OTHER): Payer: PPO

## 2023-09-30 ENCOUNTER — Ambulatory Visit: Payer: PPO | Admitting: Physical Therapy

## 2023-09-30 ENCOUNTER — Encounter: Payer: Self-pay | Admitting: Physical Therapy

## 2023-09-30 ENCOUNTER — Other Ambulatory Visit: Payer: Self-pay | Admitting: Internal Medicine

## 2023-09-30 DIAGNOSIS — M5459 Other low back pain: Secondary | ICD-10-CM | POA: Diagnosis not present

## 2023-09-30 DIAGNOSIS — D649 Anemia, unspecified: Secondary | ICD-10-CM

## 2023-09-30 DIAGNOSIS — R262 Difficulty in walking, not elsewhere classified: Secondary | ICD-10-CM | POA: Diagnosis not present

## 2023-09-30 DIAGNOSIS — I10 Essential (primary) hypertension: Secondary | ICD-10-CM

## 2023-09-30 DIAGNOSIS — M6281 Muscle weakness (generalized): Secondary | ICD-10-CM | POA: Diagnosis not present

## 2023-09-30 LAB — BASIC METABOLIC PANEL
BUN: 24 mg/dL — ABNORMAL HIGH (ref 6–23)
CO2: 26 meq/L (ref 19–32)
Calcium: 9.5 mg/dL (ref 8.4–10.5)
Chloride: 105 meq/L (ref 96–112)
Creatinine, Ser: 0.63 mg/dL (ref 0.40–1.20)
GFR: 93.22 mL/min (ref >60.00)
Glucose, Bld: 101 mg/dL — ABNORMAL HIGH (ref 70–99)
Potassium: 4.3 meq/L (ref 3.5–5.1)
Sodium: 140 meq/L (ref 135–145)

## 2023-09-30 LAB — CBC WITH DIFFERENTIAL/PLATELET
Basophils Absolute: 0.1 10*3/uL (ref 0.0–0.1)
Basophils Relative: 0.9 % (ref 0.0–3.0)
Eosinophils Absolute: 0.2 10*3/uL (ref 0.0–0.7)
Eosinophils Relative: 2.6 % (ref 0.0–5.0)
HCT: 35.6 % — ABNORMAL LOW (ref 36.0–46.0)
Hemoglobin: 12.6 g/dL (ref 12.0–15.0)
Lymphocytes Relative: 21.3 % (ref 12.0–46.0)
Lymphs Abs: 1.7 10*3/uL (ref 0.7–4.0)
MCHC: 35.3 g/dL (ref 30.0–36.0)
MCV: 97.5 fL (ref 78.0–100.0)
Monocytes Absolute: 0.7 10*3/uL (ref 0.1–1.0)
Monocytes Relative: 8.9 % (ref 3.0–12.0)
Neutro Abs: 5.4 10*3/uL (ref 1.4–7.7)
Neutrophils Relative %: 66.3 % (ref 43.0–77.0)
Platelets: 338 10*3/uL (ref 150.0–400.0)
RBC: 3.65 Mil/uL — ABNORMAL LOW (ref 3.87–5.11)
RDW: 14.5 % (ref 11.5–15.5)
WBC: 8.1 10*3/uL (ref 4.0–10.5)

## 2023-09-30 LAB — RETICULOCYTES
ABS Retic: 80080 {cells}/uL — ABNORMAL HIGH (ref 20000–80000)
Retic Ct Pct: 2.2 %

## 2023-09-30 LAB — TSH: TSH: 0.61 u[IU]/mL (ref 0.35–5.50)

## 2023-09-30 NOTE — Therapy (Signed)
OUTPATIENT PHYSICAL THERAPY TREATMENT   Patient Name: Jessica Mack MRN: [id number] DOB:03/04/1956, 67 y.o., female Today's Date: 09/30/2023  END OF SESSION:  PT End of Session - 09/30/23 1109     Visit Number 2    Number of Visits 8    Date for PT Re-Evaluation 11/08/23    Progress Note Due on Visit 10    PT Start Time 1100    PT Stop Time 1145    PT Time Calculation (min) 45 min    Activity Tolerance Patient tolerated treatment well;Patient limited by pain    Behavior During Therapy [facility] for tasks assessed/performed              Past Medical History:  Diagnosis Date   Avascular necrosis of bone (HCC)    Left talus   Blood transfusion    Breast cancer (HCC) 2005   left   Cancer (HCC)    Dysplastic nevus 1988   right hip   Fatty liver    GERD (gastroesophageal reflux disease)    History of breast cancer 2005   invasive ductal cancer left breast   Hypertension    Hypertriglyceridemia    Irritable bowel syndrome    Metabolic syndrome    resolved with diet and exercise   NASH (nonalcoholic steatohepatitis)    Nocturnal leg cramps 02/14/2015   Osteopenia    Palpitations 09/2012   normal echo   Peripheral neuropathy    Personal history of chemotherapy    Personal history of radiation therapy    Plantar fasciitis 03/2003   PONV (postoperative nausea and vomiting)    Tongue dysplasia    Ulcer    Past Surgical History:  Procedure Laterality Date   ANKLE SURGERY  07/2007   left, revascularization   BREAST LUMPECTOMY  06/2004   left, with port placement   CESAREAN SECTION  1991, 1993   x2   COLONOSCOPY  09/2013   Dr. Loreta Ave   DG GALL BLADDER     IRRIGATION AND DEBRIDEMENT ELBOW Left 05/04/2023   Procedure: LEFT ELBOW IRRIGATION AND DEBRIDEMENT;  Surgeon: [name], MD;  Location: [facility];  Service: Orthopedics;  Laterality: Left;   LAPAROSCOPIC CHOLECYSTECTOMY  2003   LUMBAR LAMINECTOMY Right 08/24/2023   Procedure: LUMBAR FOUR-FIVE  MICRODISCECTOMY, LUMBAR FIVE-SACRAL ONE RIGHT FORAMINOTOMY;  Surgeon: London Sheer, MD;  Location: [facility] OR;  Service: Orthopedics;  Laterality: Right;   MOUTH SURGERY  11/15   implant/crown placed 4/16   SHOULDER ARTHROSCOPY  10/2005   right   SHOULDER SURGERY  12/15   left   TONGUE SURGERY  2012, 2013   WRIST SURGERY  1988   right, DeQuervains   Patient Active Problem List   Diagnosis Date Noted   Anemia 09/28/2023   Radiculopathy, lumbar region 08/24/2023   Elevated ferritin level 04/05/2023   NASH (nonalcoholic steatohepatitis) 06/04/2020   Hypertriglyceridemia    Routine general medical examination at a health care facility 02/20/2020   Essential hypertension 02/20/2020   Osteopenia 08/05/2014   Breast cancer of upper-outer quadrant of left female breast (HCC) 07/20/2013   Leukoplakia of oral mucosa, including tongue 07/26/2012   Essential and other specified forms of tremor 07/26/2012   Drug-induced polyneuropathy (HCC) 07/26/2012    PCP: Etta Grandchild, MD  REFERRING PROVIDER: London Sheer, MD   REFERRING DIAG: Diagnosis M54.16 (ICD-10-CM) - Lumbar radiculopathy  Rationale for Evaluation and Treatment: Rehabilitation  THERAPY DIAG:  Other low back pain  Muscle weakness (  generalized)  Difficulty in walking, not elsewhere classified  ONSET DATE:08/24/23 L4/5 microdiscectomy with right-sided hemilaminotomy, Right L5/S1 foraminotomy  SUBJECTIVE:                                                                                                                                                                                           SUBJECTIVE STATEMENT: She expresses some soreness about 4/10, she has been doing HEP  PERTINENT HISTORY:  Recent back surgery Left talus avascular necrosis, breast cancer, peripheral neuropathy, ankle, shoulder and wrist surgeries, left elbow olecranon bursectomy  PAIN:  Are you having pain? Yes: NPRS scale: 4/10 Pain location:  glutes and down legs Pain description: Ache going down her legs  Aggravating factors: Standing  or stairs without support, lying prone Relieving factors: Heat, flexing he legs up  PRECAUTIONS: Back  RED FLAGS: None   WEIGHT BEARING RESTRICTIONS: No  FALLS:  Has patient fallen in last 6 months? Yes. Number of falls 1 with pickle ball   OCCUPATION: Retired Tourist information centre manager, very physically active but has not been able to do anything active since surgery  PLOF: Independent  PATIENT GOALS: be active again without pain limiting her return to walking, Pilates and pickle ball   OBJECTIVE:  Note: Objective measures were completed at Evaluation unless otherwise noted.  DIAGNOSTIC FINDINGS:    PATIENT SURVEYS:  Eval FOTO 34% functional, goal is 54%  SCREENING FOR RED FLAGS: Bowel or bladder incontinence: No   COGNITION: Overall cognitive status: Within functional limits for tasks assessed      MUSCLE LENGTH: Hamstrings:moderately tight bilat    LUMBAR ROM:   AROM 09/27/2023  Flexion 90%  Extension 25%  Right lateral flexion 50%  Left lateral flexion 50%  Right rotation 50%  Left rotation 50%   (Blank rows = not tested)   STRENGTH:   STRENGTH MMT in sitting Left/Right 09/27/2023   Hip flexion 3+/4   Hip extension    Hip abduction 4/4   Hip adduction    Hip internal rotation    Hip external rotation    Knee flexion 4+/4+   Knee extension 4+/4+   Ankle dorsiflexion 4+/4+   Ankle plantarflexion 4+/4+   Ankle inversion    Ankle eversion    Lumbar extension     (Blank rows = not tested)   GAIT: Eval: antalgic gait with limited distances due to pain, wider Base of support noted  TODAY'S TREATMENT:  09/30/23 Nu step L5 X 8 min seat #6, 50 sec regular pace, 10 sec fast pace each minute Seated lumbar flexion pball roll outs 5 sec X 10 Seated ab set isometric  with pball isometric shoulder ext 5 sec X 10 and isometric hip flexion 5 sec X 5 Seated lumbar  extension isometric with yellow ball 5 sec X10 Leg press DL 16# 3X10 March walking with light UE support in bars 2 round trips Tandem walking with light UE support in bars 2 round trips Supine bridges 5 sec X 15 Supine hip flexor stretch with help from strap 5 sec X10 Long axis distraction 1 min X 3 for left leg  Eval HEP creation and review with demonstration and trial set preformed, see below for details Long axis distraction 30 sec X 4 and manual single knee to chest stretching 30 sec X4 Nu step L5 X 10 min UE/LE    PATIENT EDUCATION: Education details: HEP, PT plan of care Person educated: Patient Education method: Explanation, Demonstration, Verbal cues, and Handouts Education comprehension: verbalized understanding and needs further education   HOME EXERCISE PROGRAM: Access Code: [email or web address] URL: [email or web address] Date: 09/27/2023 Prepared by: Ivery Quale  Exercises - Modified [place] Pian (Mirrored)  - 2 x daily - 6 x weekly - 1 sets - 10 reps - 10 hold - straight leg raise with help from strap  - 2 x daily - 6 x weekly - 1 sets - 3 reps - 20 sec hold - Supine Lower Trunk Rotation  - 2 x daily - 6 x weekly - 1 sets - 5 reps - 10 sec hold - Hooklying Single Knee to Chest Stretch  - 2 x daily - 6 x weekly - 1 sets - 3 reps - 20 sec hold - Standing Marching  - 2 x daily - 6 x weekly - 1-2 sets - 10 reps - Standing Hip Abduction with Counter Support  - 2 x daily - 6 x weekly - 1-2 sets - 10 reps - Standing Hip Extension with Counter Support  - 2 x daily - 6 x weekly - 1-2 sets - 10 reps - Standing Lumbar Extension  - 2 x daily - 6 x weekly - 1-2 sets - 10 reps - 5 hold  ASSESSMENT:  CLINICAL IMPRESSION: Session focused on improving mobility, LE strength, core strength, and balance within her pain tolerance. She does still have some symptoms down her left leg and we will work to reduce this while improving overall functional level.   OBJECTIVE  IMPAIRMENTS: decreased activity tolerance, difficulty walking, decreased balance, decreased endurance, decreased mobility, decreased ROM, decreased strength, impaired flexibility, impaired LE use, postural dysfunction, and pain.  ACTIVITY LIMITATIONS: bending, lifting, carry, locomotion, cleaning, community activity, driving  PERSONAL FACTORS: post op status are also affecting patient's functional outcome.  REHAB POTENTIAL: Good  CLINICAL DECISION MAKING: Stable/uncomplicated  EVALUATION COMPLEXITY: Low    GOALS: Short term PT Goals Target date: 10/25/2023  Pt will be I and compliant with HEP. Baseline:  Goal status: New Pt will decrease pain by 25% overall Baseline: Goal status: New  Long term PT goals Target date:11/08/2023   Pt will improve lumbar AROM to [facility] to improve functional mobility Baseline: Goal status: New Pt will improve  hip/knee strength to at least 5/5 MMT ins sitting to improve functional strength Baseline: Goal status: New Pt will improve FOTO to at least 54% functional to show improved function Baseline: Goal status: New Pt will reduce pain to overall less than 2-3/10 with usual activity and work activity. Baseline: Goal status: New Pt will be able to ambulate community distances at least 1000 ft WNL gait pattern without complaints Baseline: Goal status: New  PLAN: PT FREQUENCY: 1-2 times per week   PT DURATION: 4-6 weeks  PLANNED INTERVENTIONS (unless contraindicated): aquatic PT, Canalith repositioning, cryotherapy, Electrical stimulation, Iontophoresis with 4 mg/ml dexamethasome, Moist heat, traction, Ultrasound, gait training, Therapeutic exercise, balance training, neuromuscular re-education, patient/family education, , manual techniques, passive ROM, dry needling, taping, joint manipulations [id number]-Therapeutic exercises, 97530- Therapeutic activity, 97112- Neuromuscular re-education, 97535- Self Care, and 10272- Manual therapy  PLAN FOR NEXT  SESSION: lumbar mobility, general leg strength and conditioning. Balance  NEXT MD VISIT: Dr. Christell Constant 12/122024  April Manson, PT, DPT 09/30/2023, 11:52 AM

## 2023-10-01 ENCOUNTER — Encounter: Payer: Self-pay | Admitting: Internal Medicine

## 2023-10-03 ENCOUNTER — Encounter: Payer: Self-pay | Admitting: Internal Medicine

## 2023-10-03 ENCOUNTER — Other Ambulatory Visit: Payer: Self-pay | Admitting: Internal Medicine

## 2023-10-03 DIAGNOSIS — E781 Pure hyperglyceridemia: Secondary | ICD-10-CM

## 2023-10-04 ENCOUNTER — Ambulatory Visit
Admission: RE | Admit: 2023-10-04 | Discharge: 2023-10-04 | Disposition: A | Discharge status: Home / Self Care | Payer: PPO | Source: Ambulatory Visit | Attending: Obstetrics & Gynecology | Admitting: Obstetrics & Gynecology

## 2023-10-04 DIAGNOSIS — Z0000 Encounter for general adult medical examination without abnormal findings: Secondary | ICD-10-CM

## 2023-10-06 ENCOUNTER — Encounter: Payer: Self-pay | Admitting: Physical Therapy

## 2023-10-06 ENCOUNTER — Ambulatory Visit: Payer: PPO | Admitting: Physical Therapy

## 2023-10-06 ENCOUNTER — Ambulatory Visit: Payer: PPO | Admitting: Orthopedic Surgery

## 2023-10-06 DIAGNOSIS — R293 Abnormal posture: Secondary | ICD-10-CM | POA: Diagnosis not present

## 2023-10-06 DIAGNOSIS — M5459 Other low back pain: Secondary | ICD-10-CM

## 2023-10-06 DIAGNOSIS — R262 Difficulty in walking, not elsewhere classified: Secondary | ICD-10-CM | POA: Diagnosis not present

## 2023-10-06 DIAGNOSIS — M6281 Muscle weakness (generalized): Secondary | ICD-10-CM | POA: Diagnosis not present

## 2023-10-06 NOTE — Therapy (Signed)
OUTPATIENT PHYSICAL THERAPY TREATMENT   Patient Name: Jessica Mack MRN: [id number] DOB:25-Jan-1956, 67 y.o., female Today's Date: 10/06/2023  END OF SESSION:  PT End of Session - 10/06/23 1427     Visit Number 3    Number of Visits 8    Date for PT Re-Evaluation 11/08/23    Progress Note Due on Visit 10    PT Start Time 1425    PT Stop Time 1507    PT Time Calculation (min) 42 min    Activity Tolerance Patient tolerated treatment well;Patient limited by pain    Behavior During Therapy [facility] for tasks assessed/performed               Past Medical History:  Diagnosis Date   Avascular necrosis of bone (HCC)    Left talus   Blood transfusion    Breast cancer (HCC) 2005   left   Cancer (HCC)    Dysplastic nevus 1988   right hip   Fatty liver    GERD (gastroesophageal reflux disease)    History of breast cancer 2005   invasive ductal cancer left breast   Hypertension    Hypertriglyceridemia    Irritable bowel syndrome    Metabolic syndrome    resolved with diet and exercise   NASH (nonalcoholic steatohepatitis)    Nocturnal leg cramps 02/14/2015   Osteopenia    Palpitations 09/2012   normal echo   Peripheral neuropathy    Personal history of chemotherapy    Personal history of radiation therapy    Plantar fasciitis 03/2003   PONV (postoperative nausea and vomiting)    Tongue dysplasia    Ulcer    Past Surgical History:  Procedure Laterality Date   ANKLE SURGERY  07/2007   left, revascularization   BREAST LUMPECTOMY  06/2004   left, with port placement   CESAREAN SECTION  1991, 1993   x2   COLONOSCOPY  09/2013   Dr. Loreta Ave   DG GALL BLADDER     IRRIGATION AND DEBRIDEMENT ELBOW Left 05/04/2023   Procedure: LEFT ELBOW IRRIGATION AND DEBRIDEMENT;  Surgeon: [name], MD;  Location: [facility];  Service: Orthopedics;  Laterality: Left;   LAPAROSCOPIC CHOLECYSTECTOMY  2003   LUMBAR LAMINECTOMY Right 08/24/2023   Procedure: LUMBAR  FOUR-FIVE MICRODISCECTOMY, LUMBAR FIVE-SACRAL ONE RIGHT FORAMINOTOMY;  Surgeon: London Sheer, MD;  Location: [facility] OR;  Service: Orthopedics;  Laterality: Right;   MOUTH SURGERY  11/15   implant/crown placed 4/16   SHOULDER ARTHROSCOPY  10/2005   right   SHOULDER SURGERY  12/15   left   TONGUE SURGERY  2012, 2013   WRIST SURGERY  1988   right, DeQuervains   Patient Active Problem List   Diagnosis Date Noted   Anemia 09/28/2023   Radiculopathy, lumbar region 08/24/2023   Elevated ferritin level 04/05/2023   NASH (nonalcoholic steatohepatitis) 06/04/2020   Hypertriglyceridemia    Routine general medical examination at a health care facility 02/20/2020   Essential hypertension 02/20/2020   Osteopenia 08/05/2014   Breast cancer of upper-outer quadrant of left female breast (HCC) 07/20/2013   Leukoplakia of oral mucosa, including tongue 07/26/2012   Essential and other specified forms of tremor 07/26/2012   Drug-induced polyneuropathy (HCC) 07/26/2012    PCP: Etta Grandchild, MD  REFERRING PROVIDER: London Sheer, MD   REFERRING DIAG: Diagnosis M54.16 (ICD-10-CM) - Lumbar radiculopathy  Rationale for Evaluation and Treatment: Rehabilitation  THERAPY DIAG:  Other low back pain  Muscle  weakness (generalized)  Difficulty in walking, not elsewhere classified  Abnormal posture  ONSET DATE:08/24/23 L4/5 microdiscectomy with right-sided hemilaminotomy, Right L5/S1 foraminotomy  SUBJECTIVE:                                                                                                                                                                                           SUBJECTIVE STATEMENT: Having some shooting "lightning bolt" type pain   PERTINENT HISTORY:  Recent back surgery Left talus avascular necrosis, breast cancer, peripheral neuropathy, ankle, shoulder and wrist surgeries, left elbow olecranon bursectomy  PAIN:  Are you having pain? Yes: NPRS scale:  4/10 Pain location: glutes and down legs Pain description: Ache going down her legs  Aggravating factors: Standing  or stairs without support, lying prone Relieving factors: Heat, flexing he legs up  PRECAUTIONS: Back  RED FLAGS: None   WEIGHT BEARING RESTRICTIONS: No  FALLS:  Has patient fallen in last 6 months? Yes. Number of falls 1 with pickle ball   OCCUPATION: Retired [employer] information centre manager, very physically active but has not been able to do anything active since surgery  PLOF: Independent  PATIENT GOALS: be active again without pain limiting her return to walking, Pilates and pickle ball   OBJECTIVE:  Note: Objective measures were completed at Evaluation unless otherwise noted.  DIAGNOSTIC FINDINGS:    PATIENT SURVEYS:  Eval FOTO 34% functional, goal is 54%  SCREENING FOR RED FLAGS: Bowel or bladder incontinence: No   COGNITION: Overall cognitive status: Within functional limits for tasks assessed      MUSCLE LENGTH: Hamstrings:moderately tight bilat    LUMBAR ROM:   AROM 09/27/2023  Flexion 90%  Extension 25%  Right lateral flexion 50%  Left lateral flexion 50%  Right rotation 50%  Left rotation 50%   (Blank rows = not tested)   STRENGTH:   STRENGTH MMT in sitting Left/Right 09/27/2023   Hip flexion 3+/4   Hip extension    Hip abduction 4/4   Hip adduction    Hip internal rotation    Hip external rotation    Knee flexion 4+/4+   Knee extension 4+/4+   Ankle dorsiflexion 4+/4+   Ankle plantarflexion 4+/4+   Ankle inversion    Ankle eversion    Lumbar extension     (Blank rows = not tested)   GAIT: Eval: antalgic gait with limited distances due to pain, wider Base of support noted  TODAY'S TREATMENT:  10/06/23 TherEx NuStep L5 x 8 min Leg press bil 75# 3x10 Supine pelvic tilts 10 x 5 sec hold Opposite resisted hip flexion with 2 sec hold 3x5 bil Mini crunch  2x10 Sidelying clam L4 2x10 bil Sidelying hip circles x 10 CW/CCW  bil Hooklying single limb clamshell x10 bil BATCA Rows 15-20# 2x10 Lat pull downs 15# 2x10 Discussed hip hike for glute med activation  09/30/23 Nu step L5 X 8 min seat #6, 50 sec regular pace, 10 sec fast pace each minute Seated lumbar flexion pball roll outs 5 sec X 10 Seated ab set isometric with pball isometric shoulder ext 5 sec X 10 and isometric hip flexion 5 sec X 5 Seated lumbar extension isometric with yellow ball 5 sec X10 Leg press DL 78# 3X10 March walking with light UE support in bars 2 round trips Tandem walking with light UE support in bars 2 round trips Supine bridges 5 sec X 15 Supine hip flexor stretch with help from strap 5 sec X10 Long axis distraction 1 min X 3 for left leg  Eval HEP creation and review with demonstration and trial set preformed, see below for details Long axis distraction 30 sec X 4 and manual single knee to chest stretching 30 sec X4 Nu step L5 X 10 min UE/LE    PATIENT EDUCATION: Education details: HEP, PT plan of care Person educated: Patient Education method: Explanation, Demonstration, Verbal cues, and Handouts Education comprehension: verbalized understanding and needs further education   HOME EXERCISE PROGRAM: Access Code: [email or web address] URL: [email or web address] Date: 10/06/2023 Prepared by: Moshe Cipro  Exercises - Modified [place] Pian (Mirrored)  - 2 x daily - 6 x weekly - 1 sets - 10 reps - 10 hold - straight leg raise with help from strap  - 2 x daily - 6 x weekly - 1 sets - 3 reps - 20 sec hold - Supine Lower Trunk Rotation  - 2 x daily - 6 x weekly - 1 sets - 5 reps - 10 sec hold - Hooklying Single Knee to Chest Stretch  - 2 x daily - 6 x weekly - 1 sets - 3 reps - 20 sec hold - Standing Marching  - 2 x daily - 6 x weekly - 1-2 sets - 10 reps - Standing Hip Abduction with Counter Support  - 2 x daily - 6 x weekly - 1-2 sets - 10 reps - Standing Hip Extension with Counter Support  - 2 x daily - 6 x  weekly - 1-2 sets - 10 reps - Standing Lumbar Extension  - 2 x daily - 6 x weekly - 1-2 sets - 10 reps - 5 hold - Hooklying Single Leg Bent Knee Fallouts with Resistance  - 1 x daily - 7 x weekly - 2 sets - 10 reps - Sidelying Hip Circles  - 2 x daily - 7 x weekly - 2 sets - 10 circles each way - Clamshell with Resistance  - 1 x daily - 7 x weekly - 2 sets - 10 reps - Hooklying Isometric Hip Flexion with Opposite Arm  - 1 x daily - 7 x weekly - 2 sets - 10 reps - Curl Up with Reach  - 1 x daily - 7 x weekly - 2 sets - 10 reps - Standing Hip Hiking  - 1 x daily - 7 x weekly - 3 sets - 10 reps - Seated Row Cable Machine  - 1 x daily - 7 x weekly - 3 sets - 10 reps - Lat Pull Down  - 1 x daily - 7 x weekly - 3 sets - 10 reps  ASSESSMENT:  CLINICAL IMPRESSION:  Pt tolerated session well  today working on hip and core stabilization exercises.  Will continue to benefit from PT to maximize function.  OBJECTIVE IMPAIRMENTS: decreased activity tolerance, difficulty walking, decreased balance, decreased endurance, decreased mobility, decreased ROM, decreased strength, impaired flexibility, impaired LE use, postural dysfunction, and pain.  ACTIVITY LIMITATIONS: bending, lifting, carry, locomotion, cleaning, community activity, driving  PERSONAL FACTORS: post op status are also affecting patient's functional outcome.  REHAB POTENTIAL: Good  CLINICAL DECISION MAKING: Stable/uncomplicated  EVALUATION COMPLEXITY: Low    GOALS: Short term PT Goals Target date: 10/25/2023  Pt will be I and compliant with HEP. Baseline:  Goal status: New Pt will decrease pain by 25% overall Baseline: Goal status: New  Long term PT goals Target date:11/08/2023   Pt will improve lumbar AROM to [facility] to improve functional mobility Baseline: Goal status: New Pt will improve  hip/knee strength to at least 5/5 MMT ins sitting to improve functional strength Baseline: Goal status: New Pt will improve FOTO to at  least 54% functional to show improved function Baseline: Goal status: New Pt will reduce pain to overall less than 2-3/10 with usual activity and work activity. Baseline: Goal status: New Pt will be able to ambulate community distances at least 1000 ft WNL gait pattern without complaints Baseline: Goal status: New  PLAN: PT FREQUENCY: 1-2 times per week   PT DURATION: 4-6 weeks  PLANNED INTERVENTIONS (unless contraindicated): aquatic PT, Canalith repositioning, cryotherapy, Electrical stimulation, Iontophoresis with 4 mg/ml dexamethasome, Moist heat, traction, Ultrasound, gait training, Therapeutic exercise, balance training, neuromuscular re-education, patient/family education, , manual techniques, passive ROM, dry needling, taping, joint manipulations 97110-Therapeutic exercises, 97530- Therapeutic activity, 97112- Neuromuscular re-education, 97535- Self Care, and [id number]- Manual therapy  PLAN FOR NEXT SESSION: core/hip stability, lumbar mobility, general leg strength and conditioning. Balance  NEXT MD VISIT: Dr. Christell Constant 10/07/2023  Moshe Cipro, PT, DPT 10/06/2023, 3:13 PM

## 2023-10-07 ENCOUNTER — Ambulatory Visit: Payer: PPO | Admitting: Orthopedic Surgery

## 2023-10-07 DIAGNOSIS — Z9889 Other specified postprocedural states: Secondary | ICD-10-CM

## 2023-10-07 NOTE — Progress Notes (Signed)
Orthopedic Surgery Post-operative Office Visit   Procedure: right L4/5 hemilaminotomy and microdiscectomy, right L5/S1 foraminotomy Date of Surgery: 08/24/2023 (~6 weeks post-op)   Assessment: Patient is a 67 y.o. who has noted significant improvement in her radiating right leg pain.  Still has some periodic pain on the dorsal aspect of the foot that is unrelated to activity     Plan: -Operative plans complete -No spine specific precautions -With her still having some pain radiating to the foot, recommended continuing on the gabapentin.  Told her that if the foot pain resolves, she can start to wean off of the gabapentin.  Would start with twice daily and then shift to once daily dosing -Can discontinue Flexeril at this time -Continue with PT and home exercise program -Return to office in 6 weeks, x-rays needed at next visit: AP/lateral/flex/ex lumbar   ___________________________________________________________________________     Subjective: Patient has noted significant improvement in her radiating right leg pain.  She still has some pain over the dorsal aspect of the foot that comes on sporadically without any inciting injury or activity.  It resolves but has been coming back.  It can be severe in nature.  Otherwise, she is not having any pain in the right lower extremity.  She feels weak in the left quadriceps but has noted it getting better with physical therapy.  No other weakness noted.   Objective:   General: no acute distress Neurologic: alert, answering questions appropriately, following commands Respiratory: unlabored breathing on room air Skin: incision is well-healed with no erythema, induration, active/expressible drainage   MSK (spine):   -Strength exam                                                   Left                  Right   EHL                              5/5                  4/5 TA                                 5/5                  5/5 GSC                              5/5                  5/5 Knee extension            5/5                  5/5 Hip flexion                    5/5                  5/5   -Sensory exam                           Sensation  intact to light touch in L3-S1 nerve distributions of bilateral lower extremities   Imaging: None obtained today     Patient name: Jessica Mack Patient MRN: [id number] Date of visit: 10/07/23

## 2023-10-08 ENCOUNTER — Encounter: Payer: Self-pay | Admitting: Rehabilitative and Restorative Service Providers"

## 2023-10-08 ENCOUNTER — Ambulatory Visit: Payer: PPO | Admitting: Rehabilitative and Restorative Service Providers"

## 2023-10-08 DIAGNOSIS — R262 Difficulty in walking, not elsewhere classified: Secondary | ICD-10-CM

## 2023-10-08 DIAGNOSIS — M5459 Other low back pain: Secondary | ICD-10-CM | POA: Diagnosis not present

## 2023-10-08 DIAGNOSIS — M6281 Muscle weakness (generalized): Secondary | ICD-10-CM

## 2023-10-08 NOTE — Therapy (Signed)
OUTPATIENT PHYSICAL THERAPY TREATMENT   Patient Name: Jessica Mack MRN: [id number] DOB:[birth year], 67 y.o., female Today's Date: 10/08/2023  END OF SESSION:  PT End of Session - 10/08/23 0927     Visit Number 4    Number of Visits 8    Date for PT Re-Evaluation 11/08/23    Progress Note Due on Visit 10    PT Start Time 0927    PT Stop Time 1015    PT Time Calculation (min) 48 min    Activity Tolerance Patient tolerated treatment well;No increased pain;Patient limited by fatigue    Behavior During Therapy [facility] for tasks assessed/performed             Past Medical History:  Diagnosis Date   Avascular necrosis of bone (HCC)    Left talus   Blood transfusion    Breast cancer (HCC) 2005   left   Cancer (HCC)    Dysplastic nevus 1988   right hip   Fatty liver    GERD (gastroesophageal reflux disease)    History of breast cancer 2005   invasive ductal cancer left breast   Hypertension    Hypertriglyceridemia    Irritable bowel syndrome    Metabolic syndrome    resolved with diet and exercise   NASH (nonalcoholic steatohepatitis)    Nocturnal leg cramps 02/14/2015   Osteopenia    Palpitations 09/2012   normal echo   Peripheral neuropathy    Personal history of chemotherapy    Personal history of radiation therapy    Plantar fasciitis 03/2003   PONV (postoperative nausea and vomiting)    Tongue dysplasia    Ulcer    Past Surgical History:  Procedure Laterality Date   ANKLE SURGERY  07/2007   left, revascularization   BREAST LUMPECTOMY  06/2004   left, with port placement   CESAREAN SECTION  1991, 1993   x2   COLONOSCOPY  09/2013   Dr. Loreta Ave   DG GALL BLADDER     IRRIGATION AND DEBRIDEMENT ELBOW Left 05/04/2023   Procedure: LEFT ELBOW IRRIGATION AND DEBRIDEMENT;  Surgeon: [name], MD;  Location: [facility];  Service: Orthopedics;  Laterality: Left;   LAPAROSCOPIC CHOLECYSTECTOMY  2003   LUMBAR LAMINECTOMY Right 08/24/2023    Procedure: LUMBAR FOUR-FIVE MICRODISCECTOMY, LUMBAR FIVE-SACRAL ONE RIGHT FORAMINOTOMY;  Surgeon: London Sheer, MD;  Location: [facility] OR;  Service: Orthopedics;  Laterality: Right;   MOUTH SURGERY  11/15   implant/crown placed 4/16   SHOULDER ARTHROSCOPY  10/2005   right   SHOULDER SURGERY  12/15   left   TONGUE SURGERY  2012, 2013   WRIST SURGERY  1988   right, DeQuervains   Patient Active Problem List   Diagnosis Date Noted   Anemia 09/28/2023   Radiculopathy, lumbar region 08/24/2023   Elevated ferritin level 04/05/2023   NASH (nonalcoholic steatohepatitis) 06/04/2020   Hypertriglyceridemia    Routine general medical examination at a health care facility 02/20/2020   Essential hypertension 02/20/2020   Osteopenia 08/05/2014   Breast cancer of upper-outer quadrant of left female breast (HCC) 07/20/2013   Leukoplakia of oral mucosa, including tongue 07/26/2012   Essential and other specified forms of tremor 07/26/2012   Drug-induced polyneuropathy (HCC) 07/26/2012    PCP: Etta Grandchild, MD  REFERRING PROVIDER: London Sheer, MD   REFERRING DIAG: Diagnosis M54.16 (ICD-10-CM) - Lumbar radiculopathy  Rationale for Evaluation and Treatment: Rehabilitation  THERAPY DIAG:  Other low back pain  Muscle  weakness (generalized)  Difficulty in walking, not elsewhere classified  ONSET DATE: 08/24/23 L4/5 microdiscectomy with right-sided hemilaminotomy, Right L5/S1 foraminotomy  SUBJECTIVE:                                                                                                                                                                                           SUBJECTIVE STATEMENT: Narya is doing much better postsurgery.  She still has some brief, sharp pain but is overall much improved.  Improving strength, endurance and overall function is a high priority with her physical therapy.  PERTINENT HISTORY:  Recent back surgery Left talus avascular necrosis, breast  cancer, peripheral neuropathy, ankle, shoulder and wrist surgeries, left elbow olecranon bursectomy  PAIN:  Are you having pain? Yes: NPRS scale: 1-7/10 over the past week on a 10/10 Pain location: Gluteals and distal to the foot Pain description: Ache going down her legs, can be sharp  Aggravating factors: Standing or stairs without support, lying prone Relieving factors: Heat, flexion  PRECAUTIONS: Back  RED FLAGS: None   WEIGHT BEARING RESTRICTIONS: No  FALLS:  Has patient fallen in last 6 months? Yes. Number of falls 1 with pickle ball   OCCUPATION: Retired [employer] information [employer] manager, very physically active but has not been able to do anything active since surgery  PLOF: Independent  PATIENT GOALS: be active again without pain limiting her return to walking, Pilates and pickle ball   OBJECTIVE:  Note: Objective measures were completed at Evaluation unless otherwise noted.  DIAGNOSTIC FINDINGS:    PATIENT SURVEYS:  Eval FOTO 34% functional, goal is 54%  SCREENING FOR RED FLAGS: Bowel or bladder incontinence: No   COGNITION: Overall cognitive status: Within functional limits for tasks assessed      MUSCLE LENGTH: Hamstrings: moderately tight bilat  10/08/2023 Hip flexors: 115/115 Hamstrings: 45/40 Hip IR:   14/14 Hip ER:   32/37   LUMBAR ROM:   AROM 09/27/2023  Flexion 90%  Extension 25%  Right lateral flexion 50%  Left lateral flexion 50%  Right rotation 50%  Left rotation 50%   (Blank rows = not tested)   STRENGTH:   STRENGTH MMT in sitting Left/Right 09/27/2023   Hip flexion 3+/4   Hip extension    Hip abduction 4/4   Hip adduction    Hip internal rotation    Hip external rotation    Knee flexion 4+/4+   Knee extension 4+/4+   Ankle dorsiflexion 4+/4+   Ankle plantarflexion 4+/4+   Ankle inversion    Ankle eversion    Lumbar extension     (Blank rows = not tested)   GAIT: Eval:  antalgic gait with limited distances due to pain, wider Base of  support noted  TODAY'S TREATMENT:  10/08/2023 ER/Piriformis stretch (Figure 4 push) 4 x 20 seconds Hamstrings stretch 4 x 20 seconds (other leg straight, with or without strap) or Yoga bridge 2 sets of 10 x 5 seconds Hip hike at counter top 2 sets of 10 for 3 seconds  Functional Activities: Sit to stand slow eccentrics 5 x Golfer's lift practical work for emptying the dishwasher with good body mechanics   10/06/23 TherEx NuStep L5 x 8 min Leg press bil 75# 3x10 Supine pelvic tilts 10 x 5 sec hold Opposite resisted hip flexion with 2 sec hold 3x5 bil Mini crunch 2x10 Sidelying clam L4 2x10 bil Sidelying hip circles x 10 CW/CCW bil Hooklying single limb clamshell x10 bil BATCA Rows 15-20# 2x10 Lat pull downs 15# 2x10 Discussed hip hike for glute med activation   09/30/23 Nu step L5 X 8 min seat #6, 50 sec regular pace, 10 sec fast pace each minute Seated lumbar flexion pball roll outs 5 sec X 10 Seated ab set isometric with pball isometric shoulder ext 5 sec X 10 and isometric hip flexion 5 sec X 5 Seated lumbar extension isometric with yellow ball 5 sec X10 Leg press DL 40# 3X10 March walking with light UE support in bars 2 round trips Tandem walking with light UE support in bars 2 round trips Supine bridges 5 sec X 15 Supine hip flexor stretch with help from strap 5 sec X10 Long axis distraction 1 min X 3 for left leg   Eval HEP creation and review with demonstration and trial set preformed, see below for details Long axis distraction 30 sec X 4 and manual single knee to chest stretching 30 sec X4 Nu step L5 X 10 min UE/LE   PATIENT EDUCATION: Education details: HEP, PT plan of care Person educated: Patient Education method: Explanation, Demonstration, Verbal cues, and Handouts Education comprehension: verbalized understanding and needs further education   HOME EXERCISE PROGRAM: Access Code: [email or web address] URL: [email or web address] Date:  10/08/2023 Prepared by: Pauletta Browns  Exercises - Modified Erby Pian (Mirrored)  - 2 x daily - 6 x weekly - 1 sets - 10 reps - 10 hold - straight leg raise with help from strap  - 2 x daily - 6 x weekly - 1 sets - 3 reps - 20 sec hold - Supine Lower Trunk Rotation  - 2 x daily - 6 x weekly - 1 sets - 5 reps - 10 sec hold - Hooklying Single Knee to Chest Stretch  - 2 x daily - 6 x weekly - 1 sets - 3 reps - 20 sec hold - Standing Marching  - 2 x daily - 6 x weekly - 1-2 sets - 10 reps - Standing Hip Abduction with Counter Support  - 2 x daily - 6 x weekly - 1-2 sets - 10 reps - Standing Hip Extension with Counter Support  - 2 x daily - 6 x weekly - 1-2 sets - 10 reps - Standing Lumbar Extension  - 2 x daily - 6 x weekly - 1-2 sets - 10 reps - 5 hold - Hooklying Single Leg Bent Knee Fallouts with Resistance  - 1 x daily - 7 x weekly - 2 sets - 10 reps - Sidelying Hip Circles  - 2 x daily - 7 x weekly - 2 sets - 10 circles each way - Clamshell with Resistance  - 1 x daily - 7  x weekly - 2 sets - 10 reps - Hooklying Isometric Hip Flexion with Opposite Arm  - 1 x daily - 7 x weekly - 2 sets - 10 reps - Curl Up with Reach  - 1 x daily - 7 x weekly - 2 sets - 10 reps - Standing Hip Hiking  - 1 x daily - 7 x weekly - 3 sets - 10 reps - Seated Row Cable Machine  - 1 x daily - 7 x weekly - 3 sets - 10 reps - Lat Pull Down  - 1 x daily - 7 x weekly - 3 sets - 10 reps - Supine Hamstring Stretch  - 1 x daily - 7 x weekly - 1 sets - 4-5 reps - 20 seconds hold - Supine Figure 4 Piriformis Stretch  - 1 x daily - 7 x weekly - 1 sets - 4-5 reps - 20 seconds hold - Yoga Bridge  - 2 x daily - 7 x weekly - 1 sets - 10 reps - 5 seconds hold - Standing Hip Hiking  - 5 x daily - 7 x weekly - 1 sets - 10 reps - 3 seconds hold  ASSESSMENT:  CLINICAL IMPRESSION:  Koya looks much better today than when I originally saw her pre-surgery.  She does have some gluteal and lower extremity symptoms which can be  severe at times, although this is significantly improved post-surgery.  She will continue to benefit from lumbar stabilization with particular emphasis on lumbar paraspinal strengthening, quadratus lumborum and hip abductor strengthening.  We also spent some time on posture and body mechanics as Paizlie is very motivated to get back into things in a safe manner as quickly as possible.  Continue her current plan of care to address all long-term goals.  OBJECTIVE IMPAIRMENTS: decreased activity tolerance, difficulty walking, decreased balance, decreased endurance, decreased mobility, decreased ROM, decreased strength, impaired flexibility, impaired LE use, postural dysfunction, and pain.  ACTIVITY LIMITATIONS: bending, lifting, carry, locomotion, cleaning, community activity, driving  PERSONAL FACTORS: post op status are also affecting patient's functional outcome.  REHAB POTENTIAL: Good  CLINICAL DECISION MAKING: Stable/uncomplicated  EVALUATION COMPLEXITY: Low    GOALS: Short term PT Goals Target date: 10/25/2023  Pt will be I and compliant with HEP. Baseline:  Goal status: On Going 10/08/2023  Pt will decrease pain by 25% overall Baseline: Goal status: On Going 10/08/2023  Long term PT goals Target date:11/08/2023   Pt will improve lumbar AROM to [facility] to improve functional mobility Baseline: Goal status: New Pt will improve  hip/knee strength to at least 5/5 MMT ins sitting to improve functional strength Baseline: Goal status: New Pt will improve FOTO to at least 54% functional to show improved function Baseline: Goal status: New Pt will reduce pain to overall less than 2-3/10 with usual activity and work activity. Baseline: Goal status: New Pt will be able to ambulate community distances at least 1000 ft WNL gait pattern without complaints Baseline: Goal status: New  PLAN: PT FREQUENCY: 1-2 times per week   PT DURATION: 4-6 weeks  PLANNED INTERVENTIONS (unless  contraindicated): aquatic PT, Canalith repositioning, cryotherapy, Electrical stimulation, Iontophoresis with 4 mg/ml dexamethasome, Moist heat, traction, Ultrasound, gait training, Therapeutic exercise, balance training, neuromuscular re-education, patient/family education, , manual techniques, passive ROM, dry needling, taping, joint manipulations [id number]-Therapeutic exercises, 97530- Therapeutic activity, 97112- Neuromuscular re-education, 97535- Self Care, and [id number]- Manual therapy  PLAN FOR NEXT SESSION: Lumbar/hip stability, lumbar mobility (extension), general leg strength and conditioning.  Balance  PRN.  NEXT MD VISIT: Dr. Christell Constant 10/07/2023  Cherlyn Cushing, PT, MPT 10/08/2023, 2:16 PM

## 2023-10-11 ENCOUNTER — Ambulatory Visit: Payer: PPO | Admitting: Physical Therapy

## 2023-10-11 ENCOUNTER — Encounter: Payer: Self-pay | Admitting: Physical Therapy

## 2023-10-11 DIAGNOSIS — M6281 Muscle weakness (generalized): Secondary | ICD-10-CM | POA: Diagnosis not present

## 2023-10-11 DIAGNOSIS — R262 Difficulty in walking, not elsewhere classified: Secondary | ICD-10-CM

## 2023-10-11 DIAGNOSIS — M5459 Other low back pain: Secondary | ICD-10-CM | POA: Diagnosis not present

## 2023-10-11 NOTE — Therapy (Signed)
OUTPATIENT PHYSICAL THERAPY TREATMENT   Patient Name: Jessica Mack MRN: [id number] DOB:1956/07/01, 67 y.o., female Today's Date: 10/11/2023  END OF SESSION:  PT End of Session - 10/11/23 1057     Visit Number 5    Number of Visits 8    Date for PT Re-Evaluation 11/08/23    Progress Note Due on Visit 10    PT Start Time 1055    PT Stop Time 1139    PT Time Calculation (min) 44 min    Activity Tolerance Patient tolerated treatment well    Behavior During Therapy [facility] for tasks assessed/performed             Past Medical History:  Diagnosis Date   Avascular necrosis of bone (HCC)    Left talus   Blood transfusion    Breast cancer (HCC) 2005   left   Cancer (HCC)    Dysplastic nevus 1988   right hip   Fatty liver    GERD (gastroesophageal reflux disease)    History of breast cancer 2005   invasive ductal cancer left breast   Hypertension    Hypertriglyceridemia    Irritable bowel syndrome    Metabolic syndrome    resolved with diet and exercise   NASH (nonalcoholic steatohepatitis)    Nocturnal leg cramps 02/14/2015   Osteopenia    Palpitations 09/2012   normal echo   Peripheral neuropathy    Personal history of chemotherapy    Personal history of radiation therapy    Plantar fasciitis 03/2003   PONV (postoperative nausea and vomiting)    Tongue dysplasia    Ulcer    Past Surgical History:  Procedure Laterality Date   ANKLE SURGERY  07/2007   left, revascularization   BREAST LUMPECTOMY  06/2004   left, with port placement   CESAREAN SECTION  1991, 1993   x2   COLONOSCOPY  09/2013   Dr. Loreta Ave   DG GALL BLADDER     IRRIGATION AND DEBRIDEMENT ELBOW Left 05/04/2023   Procedure: LEFT ELBOW IRRIGATION AND DEBRIDEMENT;  Surgeon: [name], MD;  Location: [facility];  Service: Orthopedics;  Laterality: Left;   LAPAROSCOPIC CHOLECYSTECTOMY  2003   LUMBAR LAMINECTOMY Right 08/24/2023   Procedure: LUMBAR FOUR-FIVE MICRODISCECTOMY, LUMBAR  FIVE-SACRAL ONE RIGHT FORAMINOTOMY;  Surgeon: London Sheer, MD;  Location: [facility] OR;  Service: Orthopedics;  Laterality: Right;   MOUTH SURGERY  11/15   implant/crown placed 4/16   SHOULDER ARTHROSCOPY  10/2005   right   SHOULDER SURGERY  12/15   left   TONGUE SURGERY  2012, 2013   WRIST SURGERY  1988   right, DeQuervains   Patient Active Problem List   Diagnosis Date Noted   Anemia 09/28/2023   Radiculopathy, lumbar region 08/24/2023   Elevated ferritin level 04/05/2023   NASH (nonalcoholic steatohepatitis) 06/04/2020   Hypertriglyceridemia    Routine general medical examination at a health care facility 02/20/2020   Essential hypertension 02/20/2020   Osteopenia 08/05/2014   Breast cancer of upper-outer quadrant of left female breast (HCC) 07/20/2013   Leukoplakia of oral mucosa, including tongue 07/26/2012   Essential and other specified forms of tremor 07/26/2012   Drug-induced polyneuropathy (HCC) 07/26/2012    PCP: Etta Grandchild, MD  REFERRING PROVIDER: London Sheer, MD   REFERRING DIAG: Diagnosis M54.16 (ICD-10-CM) - Lumbar radiculopathy  Rationale for Evaluation and Treatment: Rehabilitation  THERAPY DIAG:  Other low back pain  Muscle weakness (generalized)  Difficulty in  walking, not elsewhere classified  ONSET DATE: 08/24/23 L4/5 microdiscectomy with right-sided hemilaminotomy, Right L5/S1 foraminotomy  SUBJECTIVE:                                                                                                                                                                                           SUBJECTIVE STATEMENT: Still with some pain that shoots down into her foot at times  PERTINENT HISTORY:  Recent back surgery Left talus avascular necrosis, breast cancer, peripheral neuropathy, ankle, shoulder and wrist surgeries, left elbow olecranon bursectomy  PAIN:  Are you having pain? Yes: NPRS scale: did not provide number today /10 Pain location:  Gluteals and distal to the foot Pain description: Ache going down her legs, can be sharp  Aggravating factors: Standing or stairs without support, lying prone Relieving factors: Heat, flexion  PRECAUTIONS: Back  RED FLAGS: None   WEIGHT BEARING RESTRICTIONS: No  FALLS:  Has patient fallen in last 6 months? Yes. Number of falls 1 with pickle ball   OCCUPATION: Retired [employer] information [employer] manager, very physically active but has not been able to do anything active since surgery  PLOF: Independent  PATIENT GOALS: be active again without pain limiting her return to walking, Pilates and pickle ball   OBJECTIVE:  Note: Objective measures were completed at Evaluation unless otherwise noted.  DIAGNOSTIC FINDINGS:    PATIENT SURVEYS:  Eval FOTO 34% functional, goal is 54%  SCREENING FOR RED FLAGS: Bowel or bladder incontinence: No   COGNITION: Overall cognitive status: Within functional limits for tasks assessed      MUSCLE LENGTH: Hamstrings: moderately tight bilat  10/08/2023 Hip flexors: 115/115 Hamstrings: 45/40 Hip IR:   14/14 Hip ER:   32/37   LUMBAR ROM:   AROM 09/27/2023 10/11/23  Flexion 90%   Extension 25% 50% observed with exercises  Right lateral flexion 50%   Left lateral flexion 50%   Right rotation 50%   Left rotation 50%    (Blank rows = not tested)   STRENGTH:   STRENGTH MMT in sitting Left/Right 09/27/2023   Hip flexion 3+/4   Hip extension    Hip abduction 4/4   Hip adduction    Hip internal rotation    Hip external rotation    Knee flexion 4+/4+   Knee extension 4+/4+   Ankle dorsiflexion 4+/4+   Ankle plantarflexion 4+/4+   Ankle inversion    Ankle eversion    Lumbar extension     (Blank rows = not tested)   GAIT: Eval: antalgic gait with limited distances due to pain, wider Base of support noted  TODAY'S TREATMENT:  10/11/2023 Nu  step L5 X 8 min Education on dynamic warmup and walking program of 3 short walks today adding up to 20 min  cumulative for now then progressing slowly as tolerated. ER/Piriformis stretch (Figure 4 push) 3 x 20 seconds bilat seated Hamstrings stretch 3X20 sec bilat seated Hip hike at counter top 3 sets of 5 for 3 seconds Forward Step ups 6 inch X 10 bilat, no UE support Lateral step ups 6 inch step X 10 bilat Suitcase carry 5# one lap in gym 120 feet each side Pball roll up wall stretching into thoracolumbar extension 5 sec X 10  Neuro RE-ed Blaze pods 30 sec X2 holding tandem balance and using UE to turn pods off, 4 pods at window  Blaze pods SLS using LE to turn off 30 sec X 2 with frequent touchdowns as needed   10/08/2023 ER/Piriformis stretch (Figure 4 push) 4 x 20 seconds Hamstrings stretch 4 x 20 seconds (other leg straight, with or without strap) or Yoga bridge 2 sets of 10 x 5 seconds Hip hike at counter top 2 sets of 10 for 3 seconds  Functional Activities: Sit to stand slow eccentrics 5 x Golfer's lift practical work for emptying the dishwasher with good body mechanics   10/06/23 TherEx NuStep L5 x 8 min Leg press bil 75# 3x10 Supine pelvic tilts 10 x 5 sec hold Opposite resisted hip flexion with 2 sec hold 3x5 bil Mini crunch 2x10 Sidelying clam L4 2x10 bil Sidelying hip circles x 10 CW/CCW bil Hooklying single limb clamshell x10 bil BATCA Rows 15-20# 2x10 Lat pull downs 15# 2x10 Discussed hip hike for glute med activation   09/30/23 Nu step L5 X 8 min seat #6, 50 sec regular pace, 10 sec fast pace each minute Seated lumbar flexion pball roll outs 5 sec X 10 Seated ab set isometric with pball isometric shoulder ext 5 sec X 10 and isometric hip flexion 5 sec X 5 Seated lumbar extension isometric with yellow ball 5 sec X10 Leg press DL 45# 3X10 March walking with light UE support in bars 2 round trips Tandem walking with light UE support in bars 2 round trips Supine bridges 5 sec X 15 Supine hip flexor stretch with help from strap 5 sec X10 Long axis distraction 1  min X 3 for left leg    PATIENT EDUCATION: Education details: HEP, PT plan of care Person educated: Patient Education method: Explanation, Demonstration, Verbal cues, and Handouts Education comprehension: verbalized understanding and needs further education   HOME EXERCISE PROGRAM: Access Code: [email or web address] URL: [email or web address] Date: 10/08/2023 Prepared by: Pauletta Browns  Exercises - Modified Erby Pian (Mirrored)  - 2 x daily - 6 x weekly - 1 sets - 10 reps - 10 hold - straight leg raise with help from strap  - 2 x daily - 6 x weekly - 1 sets - 3 reps - 20 sec hold - Supine Lower Trunk Rotation  - 2 x daily - 6 x weekly - 1 sets - 5 reps - 10 sec hold - Hooklying Single Knee to Chest Stretch  - 2 x daily - 6 x weekly - 1 sets - 3 reps - 20 sec hold - Standing Marching  - 2 x daily - 6 x weekly - 1-2 sets - 10 reps - Standing Hip Abduction with Counter Support  - 2 x daily - 6 x weekly - 1-2 sets - 10 reps - Standing Hip Extension with Counter Support  - 2 x daily -  6 x weekly - 1-2 sets - 10 reps - Standing Lumbar Extension  - 2 x daily - 6 x weekly - 1-2 sets - 10 reps - 5 hold - Hooklying Single Leg Bent Knee Fallouts with Resistance  - 1 x daily - 7 x weekly - 2 sets - 10 reps - Sidelying Hip Circles  - 2 x daily - 7 x weekly - 2 sets - 10 circles each way - Clamshell with Resistance  - 1 x daily - 7 x weekly - 2 sets - 10 reps - Hooklying Isometric Hip Flexion with Opposite Arm  - 1 x daily - 7 x weekly - 2 sets - 10 reps - Curl Up with Reach  - 1 x daily - 7 x weekly - 2 sets - 10 reps - Standing Hip Hiking  - 1 x daily - 7 x weekly - 3 sets - 10 reps - Seated Row Cable Machine  - 1 x daily - 7 x weekly - 3 sets - 10 reps - Lat Pull Down  - 1 x daily - 7 x weekly - 3 sets - 10 reps - Supine Hamstring Stretch  - 1 x daily - 7 x weekly - 1 sets - 4-5 reps - 20 seconds hold - Supine Figure 4 Piriformis Stretch  - 1 x daily - 7 x weekly - 1 sets - 4-5 reps -  20 seconds hold - Yoga Bridge  - 2 x daily - 7 x weekly - 1 sets - 10 reps - 5 seconds hold - Standing Hip Hiking  - 5 x daily - 7 x weekly - 1 sets - 10 reps - 3 seconds hold  ASSESSMENT:  CLINICAL IMPRESSION:  She is still having some pain which is to be expected but this has overall improved and her gait/balance as well as activity tolerance have also showed improvement. I did review walking program and dynamic warm up today with her.  PT will monitor for any soreness and continue her current plan of care to address all long-term goals.  OBJECTIVE IMPAIRMENTS: decreased activity tolerance, difficulty walking, decreased balance, decreased endurance, decreased mobility, decreased ROM, decreased strength, impaired flexibility, impaired LE use, postural dysfunction, and pain.  ACTIVITY LIMITATIONS: bending, lifting, carry, locomotion, cleaning, community activity, driving  PERSONAL FACTORS: post op status are also affecting patient's functional outcome.  REHAB POTENTIAL: Good  CLINICAL DECISION MAKING: Stable/uncomplicated  EVALUATION COMPLEXITY: Low    GOALS: Short term PT Goals Target date: 10/25/2023  Pt will be I and compliant with HEP. Baseline:  Goal status: On Going 10/08/2023  Pt will decrease pain by 25% overall Baseline: Goal status: On Going 10/08/2023  Long term PT goals Target date:11/08/2023   Pt will improve lumbar AROM to [facility] to improve functional mobility Baseline: Goal status: ongoing 10/11/23 Pt will improve  hip/knee strength to at least 5/5 MMT ins sitting to improve functional strength Baseline: Goal status: ongoing 10/11/23 Pt will improve FOTO to at least 54% functional to show improved function Baseline: Goal status: ongoing 10/11/23 Pt will reduce pain to overall less than 2-3/10 with usual activity and work activity. Baseline: Goal status: ongoing 10/11/23 Pt will be able to ambulate community distances at least 1000 ft WNL gait pattern  without complaints Baseline: Goal status: ongoing 10/11/23  PLAN: PT FREQUENCY: 1-2 times per week   PT DURATION: 4-6 weeks  PLANNED INTERVENTIONS (unless contraindicated): aquatic PT, Canalith repositioning, cryotherapy, Electrical stimulation, Iontophoresis with 4 mg/ml dexamethasome, Moist heat,  traction, Ultrasound, gait training, Therapeutic exercise, balance training, neuromuscular re-education, patient/family education, , manual techniques, passive ROM, dry needling, taping, joint manipulations 97110-Therapeutic exercises, 97530- Therapeutic activity, 97112- Neuromuscular re-education, 97535- Self Care, and [id number]- Manual therapy  PLAN FOR NEXT SESSION: Lumbar/hip stability, lumbar mobility (extension), general leg strength and conditioning.  Balance PRN.  NEXT MD VISIT: 11/22/23  April Manson, PT, DPT 10/11/2023, 11:49 AM

## 2023-10-12 NOTE — Therapy (Signed)
OUTPATIENT PHYSICAL THERAPY TREATMENT   Patient Name: Jessica Mack MRN: [id number] DOB:03-20-1956, 67 y.o., female Today's Date: 10/13/2023  END OF SESSION:  PT End of Session - 10/13/23 1103     Visit Number 6    Number of Visits 8    Date for PT Re-Evaluation 11/08/23    Progress Note Due on Visit 10    PT Start Time 1100    PT Stop Time 1140    PT Time Calculation (min) 40 min    Activity Tolerance Patient tolerated treatment well    Behavior During Therapy [facility] for tasks assessed/performed              Past Medical History:  Diagnosis Date   Avascular necrosis of bone (HCC)    Left talus   Blood transfusion    Breast cancer (HCC) 2005   left   Cancer (HCC)    Dysplastic nevus 1988   right hip   Fatty liver    GERD (gastroesophageal reflux disease)    History of breast cancer 2005   invasive ductal cancer left breast   Hypertension    Hypertriglyceridemia    Irritable bowel syndrome    Metabolic syndrome    resolved with diet and exercise   NASH (nonalcoholic steatohepatitis)    Nocturnal leg cramps 02/14/2015   Osteopenia    Palpitations 09/2012   normal echo   Peripheral neuropathy    Personal history of chemotherapy    Personal history of radiation therapy    Plantar fasciitis 03/2003   PONV (postoperative nausea and vomiting)    Tongue dysplasia    Ulcer    Past Surgical History:  Procedure Laterality Date   ANKLE SURGERY  07/2007   left, revascularization   BREAST LUMPECTOMY  06/2004   left, with port placement   CESAREAN SECTION  1991, 1993   x2   COLONOSCOPY  09/2013   Dr. Loreta Ave   DG GALL BLADDER     IRRIGATION AND DEBRIDEMENT ELBOW Left 05/04/2023   Procedure: LEFT ELBOW IRRIGATION AND DEBRIDEMENT;  Surgeon: [name], MD;  Location: [facility];  Service: Orthopedics;  Laterality: Left;   LAPAROSCOPIC CHOLECYSTECTOMY  2003   LUMBAR LAMINECTOMY Right 08/24/2023   Procedure: LUMBAR FOUR-FIVE MICRODISCECTOMY,  LUMBAR FIVE-SACRAL ONE RIGHT FORAMINOTOMY;  Surgeon: London Sheer, MD;  Location: [facility] OR;  Service: Orthopedics;  Laterality: Right;   MOUTH SURGERY  11/15   implant/crown placed 4/16   SHOULDER ARTHROSCOPY  10/2005   right   SHOULDER SURGERY  12/15   left   TONGUE SURGERY  2012, 2013   WRIST SURGERY  1988   right, DeQuervains   Patient Active Problem List   Diagnosis Date Noted   Anemia 09/28/2023   Radiculopathy, lumbar region 08/24/2023   Elevated ferritin level 04/05/2023   NASH (nonalcoholic steatohepatitis) 06/04/2020   Hypertriglyceridemia    Routine general medical examination at a health care facility 02/20/2020   Essential hypertension 02/20/2020   Osteopenia 08/05/2014   Breast cancer of upper-outer quadrant of left female breast (HCC) 07/20/2013   Leukoplakia of oral mucosa, including tongue 07/26/2012   Essential and other specified forms of tremor 07/26/2012   Drug-induced polyneuropathy (HCC) 07/26/2012    PCP: Etta Grandchild, MD  REFERRING PROVIDER: London Sheer, MD   REFERRING DIAG: Diagnosis M54.16 (ICD-10-CM) - Lumbar radiculopathy  Rationale for Evaluation and Treatment: Rehabilitation  THERAPY DIAG:  Other low back pain  Muscle weakness (generalized)  Difficulty  in walking, not elsewhere classified  Abnormal posture  ONSET DATE: 08/24/23 L4/5 microdiscectomy with right-sided hemilaminotomy, Right L5/S1 foraminotomy  SUBJECTIVE:                                                                                                                                                                                           SUBJECTIVE STATEMENT: Having some soreness in her glute; feels like her Lt side is weaker  PERTINENT HISTORY:  Recent back surgery Left talus avascular necrosis, breast cancer, peripheral neuropathy, ankle, shoulder and wrist surgeries, left elbow olecranon bursectomy  PAIN:  Are you having pain? Yes: NPRS scale: no pain in  back, 2 in glute /10 Pain location: Gluteals and distal to the foot Pain description: Ache going down her legs, can be sharp  Aggravating factors: Standing or stairs without support, lying prone Relieving factors: Heat, flexion  PRECAUTIONS: Back  RED FLAGS: None   WEIGHT BEARING RESTRICTIONS: No  FALLS:  Has patient fallen in last 6 months? Yes. Number of falls 1 with pickle ball   OCCUPATION: Retired [employer] information [employer] manager, very physically active but has not been able to do anything active since surgery  PLOF: Independent  PATIENT GOALS: be active again without pain limiting her return to walking, Pilates and pickle ball   OBJECTIVE:  Note: Objective measures were completed at Evaluation unless otherwise noted.  DIAGNOSTIC FINDINGS:    PATIENT SURVEYS:  Eval FOTO 34% functional, goal is 54%  SCREENING FOR RED FLAGS: Bowel or bladder incontinence: No   COGNITION: Overall cognitive status: Within functional limits for tasks assessed      MUSCLE LENGTH: Hamstrings: moderately tight bilat  10/08/2023 Hip flexors: 115/115 Hamstrings: 45/40 Hip IR:   14/14 Hip ER:   32/37   LUMBAR ROM:   AROM 09/27/2023 10/11/23  Flexion 90%   Extension 25% 50% observed with exercises  Right lateral flexion 50%   Left lateral flexion 50%   Right rotation 50%   Left rotation 50%    (Blank rows = not tested)   STRENGTH:   STRENGTH MMT in sitting Left/Right 09/27/2023   Hip flexion 3+/4   Hip extension    Hip abduction 4/4   Hip adduction    Hip internal rotation    Hip external rotation    Knee flexion 4+/4+   Knee extension 4+/4+   Ankle dorsiflexion 4+/4+   Ankle plantarflexion 4+/4+   Ankle inversion    Ankle eversion    Lumbar extension     (Blank rows = not tested)   GAIT: Eval: antalgic gait with limited distances due to pain, wider Base of support  noted  TODAY'S TREATMENT:  10/13/23 TherEx NuStep L5 X 8 min Leg Press 87# 3x10; single leg 37# 3x10 bil TRX  squats 3x10 Mini crunch 2x10 with hands on thighs Table top reverse crunch 2x10  10/11/2023 Nu step L5 X 8 min Education on dynamic warmup and walking program of 3 short walks today adding up to 20 min cumulative for now then progressing slowly as tolerated. ER/Piriformis stretch (Figure 4 push) 3 x 20 seconds bilat seated Hamstrings stretch 3X20 sec bilat seated Hip hike at counter top 3 sets of 5 for 3 seconds Forward Step ups 6 inch X 10 bilat, no UE support Lateral step ups 6 inch step X 10 bilat Suitcase carry 5# one lap in gym 120 feet each side Pball roll up wall stretching into thoracolumbar extension 5 sec X 10  Neuro RE-ed Blaze pods 30 sec X2 holding tandem balance and using UE to turn pods off, 4 pods at window  Blaze pods SLS using LE to turn off 30 sec X 2 with frequent touchdowns as needed   10/08/2023 ER/Piriformis stretch (Figure 4 push) 4 x 20 seconds Hamstrings stretch 4 x 20 seconds (other leg straight, with or without strap) or Yoga bridge 2 sets of 10 x 5 seconds Hip hike at counter top 2 sets of 10 for 3 seconds  Functional Activities: Sit to stand slow eccentrics 5 x Golfer's lift practical work for emptying the dishwasher with good body mechanics   10/06/23 TherEx NuStep L5 x 8 min Leg press bil 75# 3x10 Supine pelvic tilts 10 x 5 sec hold Opposite resisted hip flexion with 2 sec hold 3x5 bil Mini crunch 2x10 Sidelying clam L4 2x10 bil Sidelying hip circles x 10 CW/CCW bil Hooklying single limb clamshell x10 bil BATCA Rows 15-20# 2x10 Lat pull downs 15# 2x10 Discussed hip hike for glute med activation    PATIENT EDUCATION: Education details: HEP, PT plan of care Person educated: Patient Education method: Explanation, Demonstration, Verbal cues, and Handouts Education comprehension: verbalized understanding and needs further education   HOME EXERCISE PROGRAM: Access Code: [email or web address] URL: [email or web address] Date:  10/13/2023 Prepared by: Moshe Cipro  Exercises - Modified [place]by Pian (Mirrored)  - 2 x daily - 6 x weekly - 1 sets - 10 reps - 10 hold - straight leg raise with help from strap  - 2 x daily - 6 x weekly - 1 sets - 3 reps - 20 sec hold - Supine Lower Trunk Rotation  - 2 x daily - 6 x weekly - 1 sets - 5 reps - 10 sec hold - Hooklying Single Knee to Chest Stretch  - 2 x daily - 6 x weekly - 1 sets - 3 reps - 20 sec hold - Standing Marching  - 2 x daily - 6 x weekly - 1-2 sets - 10 reps - Standing Hip Abduction with Counter Support  - 2 x daily - 6 x weekly - 1-2 sets - 10 reps - Standing Hip Extension with Counter Support  - 2 x daily - 6 x weekly - 1-2 sets - 10 reps - Standing Lumbar Extension  - 2 x daily - 6 x weekly - 1-2 sets - 10 reps - 5 hold - Hooklying Single Leg Bent Knee Fallouts with Resistance  - 1 x daily - 7 x weekly - 2 sets - 10 reps - Sidelying Hip Circles  - 2 x daily - 7 x weekly - 2 sets - 10 circles each way -  Clamshell with Resistance  - 1 x daily - 7 x weekly - 2 sets - 10 reps - Hooklying Isometric Hip Flexion with Opposite Arm  - 1 x daily - 7 x weekly - 2 sets - 10 reps - Curl Up with Reach  - 1 x daily - 7 x weekly - 2 sets - 10 reps - Standing Hip Hiking  - 1 x daily - 7 x weekly - 3 sets - 10 reps - Seated Row Cable Machine  - 1 x daily - 7 x weekly - 3 sets - 10 reps - Lat Pull Down  - 1 x daily - 7 x weekly - 3 sets - 10 reps - Supine Hamstring Stretch  - 1 x daily - 7 x weekly - 1 sets - 4-5 reps - 20 seconds hold - Supine Figure 4 Piriformis Stretch  - 1 x daily - 7 x weekly - 1 sets - 4-5 reps - 20 seconds hold - Yoga Bridge  - 2 x daily - 7 x weekly - 1 sets - 10 reps - 5 seconds hold - Standing Hip Hiking  - 5 x daily - 7 x weekly - 1 sets - 10 reps - 3 seconds hold - DNS Bug Heel Touches  - 1 x daily - 7 x weekly - 3 sets - 10 reps - Swiss Ball Reverse Crunch  - 1 x daily - 7 x weekly - 3 sets - 10 reps  ASSESSMENT:  CLINICAL IMPRESSION:   Pt tolerated session well today with continued work on strengthening and core exercises.  Discussed graded movement and increasing activity tolerance as pain and comfort allows.  Will continue to benefit from PT to maximize function.  OBJECTIVE IMPAIRMENTS: decreased activity tolerance, difficulty walking, decreased balance, decreased endurance, decreased mobility, decreased ROM, decreased strength, impaired flexibility, impaired LE use, postural dysfunction, and pain.  ACTIVITY LIMITATIONS: bending, lifting, carry, locomotion, cleaning, community activity, driving  PERSONAL FACTORS: post op status are also affecting patient's functional outcome.  REHAB POTENTIAL: Good  CLINICAL DECISION MAKING: Stable/uncomplicated  EVALUATION COMPLEXITY: Low    GOALS: Short term PT Goals Target date: 10/25/2023  Pt will be I and compliant with HEP. Baseline:  Goal status: On Going 10/08/2023  Pt will decrease pain by 25% overall Baseline: Goal status: On Going 10/08/2023  Long term PT goals Target date:11/08/2023   Pt will improve lumbar AROM to [facility] to improve functional mobility Baseline: Goal status: ongoing 10/11/23 Pt will improve  hip/knee strength to at least 5/5 MMT ins sitting to improve functional strength Baseline: Goal status: ongoing 10/11/23 Pt will improve FOTO to at least 54% functional to show improved function Baseline: Goal status: ongoing 10/11/23 Pt will reduce pain to overall less than 2-3/10 with usual activity and work activity. Baseline: Goal status: ongoing 10/11/23 Pt will be able to ambulate community distances at least 1000 ft WNL gait pattern without complaints Baseline: Goal status: ongoing 10/11/23  PLAN: PT FREQUENCY: 1-2 times per week   PT DURATION: 4-6 weeks  PLANNED INTERVENTIONS (unless contraindicated): aquatic PT, Canalith repositioning, cryotherapy, Electrical stimulation, Iontophoresis with 4 mg/ml dexamethasome, Moist heat, traction,  Ultrasound, gait training, Therapeutic exercise, balance training, neuromuscular re-education, patient/family education, , manual techniques, passive ROM, dry needling, taping, joint manipulations [id number]-Therapeutic exercises, 97530- Therapeutic activity, 97112- Neuromuscular re-education, 97535- Self Care, and [id number]- Manual therapy  PLAN FOR NEXT SESSION: continue Lumbar/hip stability, lumbar mobility (extension), general leg strength and conditioning.  Balance PRN.  NEXT MD VISIT:  11/22/23  Moshe Cipro, PT, DPT 10/13/2023, 11:42 AM

## 2023-10-13 ENCOUNTER — Ambulatory Visit: Payer: PPO | Admitting: Physical Therapy

## 2023-10-13 ENCOUNTER — Encounter: Payer: Self-pay | Admitting: Physical Therapy

## 2023-10-13 DIAGNOSIS — R293 Abnormal posture: Secondary | ICD-10-CM | POA: Diagnosis not present

## 2023-10-13 DIAGNOSIS — R262 Difficulty in walking, not elsewhere classified: Secondary | ICD-10-CM | POA: Diagnosis not present

## 2023-10-13 DIAGNOSIS — M5459 Other low back pain: Secondary | ICD-10-CM | POA: Diagnosis not present

## 2023-10-13 DIAGNOSIS — M6281 Muscle weakness (generalized): Secondary | ICD-10-CM | POA: Diagnosis not present

## 2023-10-14 ENCOUNTER — Other Ambulatory Visit: Payer: Self-pay | Admitting: Medical Genetics

## 2023-10-21 ENCOUNTER — Encounter: Payer: Self-pay | Admitting: Rehabilitative and Restorative Service Providers"

## 2023-10-21 ENCOUNTER — Ambulatory Visit: Payer: PPO | Admitting: Rehabilitative and Restorative Service Providers"

## 2023-10-21 DIAGNOSIS — M6281 Muscle weakness (generalized): Secondary | ICD-10-CM | POA: Diagnosis not present

## 2023-10-21 DIAGNOSIS — R262 Difficulty in walking, not elsewhere classified: Secondary | ICD-10-CM

## 2023-10-21 DIAGNOSIS — M5459 Other low back pain: Secondary | ICD-10-CM | POA: Diagnosis not present

## 2023-10-21 NOTE — Therapy (Signed)
OUTPATIENT PHYSICAL THERAPY TREATMENT   Patient Name: Jessica Mack MRN: [id number] DOB:01/07/1956, 67 y.o., female Today's Date: 10/21/2023  END OF SESSION:  PT End of Session - 10/21/23 1018     Visit Number 7    Number of Visits 8    Date for PT Re-Evaluation 11/08/23    Progress Note Due on Visit 10    PT Start Time 1017    PT Stop Time 1103    PT Time Calculation (min) 46 min    Activity Tolerance Patient tolerated treatment well;No increased pain;Patient limited by pain    Behavior During Therapy [facility] for tasks assessed/performed             Past Medical History:  Diagnosis Date   Avascular necrosis of bone (HCC)    Left talus   Blood transfusion    Breast cancer (HCC) 2005   left   Cancer (HCC)    Dysplastic nevus 1988   right hip   Fatty liver    GERD (gastroesophageal reflux disease)    History of breast cancer 2005   invasive ductal cancer left breast   Hypertension    Hypertriglyceridemia    Irritable bowel syndrome    Metabolic syndrome    resolved with diet and exercise   NASH (nonalcoholic steatohepatitis)    Nocturnal leg cramps 02/14/2015   Osteopenia    Palpitations 09/2012   normal echo   Peripheral neuropathy    Personal history of chemotherapy    Personal history of radiation therapy    Plantar fasciitis 03/2003   PONV (postoperative nausea and vomiting)    Tongue dysplasia    Ulcer    Past Surgical History:  Procedure Laterality Date   ANKLE SURGERY  07/2007   left, revascularization   BREAST LUMPECTOMY  06/2004   left, with port placement   CESAREAN SECTION  1991, 1993   x2   COLONOSCOPY  09/2013   Dr. Loreta Ave   DG GALL BLADDER     IRRIGATION AND DEBRIDEMENT ELBOW Left 05/04/2023   Procedure: LEFT ELBOW IRRIGATION AND DEBRIDEMENT;  Surgeon: [name], MD;  Location: [facility];  Service: Orthopedics;  Laterality: Left;   LAPAROSCOPIC CHOLECYSTECTOMY  2003   LUMBAR LAMINECTOMY Right 08/24/2023   Procedure:  LUMBAR FOUR-FIVE MICRODISCECTOMY, LUMBAR FIVE-SACRAL ONE RIGHT FORAMINOTOMY;  Surgeon: London Sheer, MD;  Location: [facility] OR;  Service: Orthopedics;  Laterality: Right;   MOUTH SURGERY  11/15   implant/crown placed 4/16   SHOULDER ARTHROSCOPY  10/2005   right   SHOULDER SURGERY  12/15   left   TONGUE SURGERY  2012, 2013   WRIST SURGERY  1988   right, DeQuervains   Patient Active Problem List   Diagnosis Date Noted   Anemia 09/28/2023   Radiculopathy, lumbar region 08/24/2023   Elevated ferritin level 04/05/2023   NASH (nonalcoholic steatohepatitis) 06/04/2020   Hypertriglyceridemia    Routine general medical examination at a health care facility 02/20/2020   Essential hypertension 02/20/2020   Osteopenia 08/05/2014   Breast cancer of upper-outer quadrant of left female breast (HCC) 07/20/2013   Leukoplakia of oral mucosa, including tongue 07/26/2012   Essential and other specified forms of tremor 07/26/2012   Drug-induced polyneuropathy (HCC) 07/26/2012    PCP: Etta Grandchild, MD  REFERRING PROVIDER: London Sheer, MD   REFERRING DIAG: Diagnosis M54.16 (ICD-10-CM) - Lumbar radiculopathy  Rationale for Evaluation and Treatment: Rehabilitation  THERAPY DIAG:  Other low back pain  Muscle  weakness (generalized)  Difficulty in walking, not elsewhere classified  ONSET DATE: 08/24/23 L4/5 microdiscectomy with right-sided hemilaminotomy, Right L5/S1 foraminotomy  SUBJECTIVE:                                                                                                                                                                                           SUBJECTIVE STATEMENT: Jessica Mack notes that she is having more pain on the left side than right.  Cramping, dull, achy is how she describes it.  She is headed out on a 3 week road trip and will follow-up with PT afterwards.  PERTINENT HISTORY:  Recent back surgery Left talus avascular necrosis, breast cancer, peripheral  neuropathy, ankle, shoulder and wrist surgeries, left elbow olecranon bursectomy  PAIN:  Are you having pain? Yes: NPRS scale: 0-4/10 Pain location: Mild low back, Gluteal/hip abductors and distal to the foot on the left Pain description: Ache going down her left leg, occasional sharp pain on the right Aggravating factors: Standing or stairs without support, lying prone Relieving factors: Heat, flexion  PRECAUTIONS: Back  RED FLAGS: None   WEIGHT BEARING RESTRICTIONS: No  FALLS:  Has patient fallen in last 6 months? Yes. Number of falls 1 with pickle ball   OCCUPATION: Retired [employer] information [employer] manager, very physically active but has not been able to do anything active since surgery  PLOF: Independent  PATIENT GOALS: Be active again without pain limiting her return to walking, Pilates and pickle ball   OBJECTIVE:  Note: Objective measures were completed at Evaluation unless otherwise noted.  DIAGNOSTIC FINDINGS:    PATIENT SURVEYS:  Eval FOTO 34% functional, goal is 54%  SCREENING FOR RED FLAGS: Bowel or bladder incontinence: No   COGNITION: Overall cognitive status: Within functional limits for tasks assessed      MUSCLE LENGTH: Hamstrings: moderately tight bilat  10/08/2023   10/21/2023 Hip flexors: 115/115  Hamstrings: 45/40  50/50 Hip IR:   14/14 Hip ER:   32/37  27/41   LUMBAR ROM:   AROM 09/27/2023 10/11/23  Flexion 90%   Extension 25% 50% observed with exercises  Right lateral flexion 50%   Left lateral flexion 50%   Right rotation 50%   Left rotation 50%    (Blank rows = not tested)   STRENGTH:   STRENGTH MMT in sitting Left/Right 09/27/2023   Hip flexion 3+/4   Hip extension    Hip abduction 4/4   Hip adduction    Hip internal rotation    Hip external rotation    Knee flexion 4+/4+   Knee extension 4+/4+   Ankle dorsiflexion 4+/4+   Ankle plantarflexion 4+/4+  Ankle inversion    Ankle eversion    Lumbar extension     (Blank rows = not  tested)   GAIT: Eval: antalgic gait with limited distances due to pain, wider Base of support noted  TODAY'S TREATMENT:  10/21/2023 Bridging 10 x 5 seconds Prone alternating hip extensions 10 x 3 seconds Hamstrings stretch 2 x 20 seconds ER/Figure 4 stretch 4 x 20 seconds Side-lie clams (lie Rt, lift Left) with Red Thera-band 20 x 3 seconds Hip hike 2 sets of 10 x 3 seconds at counter top  Functional Activities: Reviewed log roll, lifting techniques, cues for driving/comfort with her upcoming road trip to/from [place].  Answered questions about her left sided symptoms and reviewed HEP with emphasis on 7 x a week exercises to address current left sided concerns.   10/13/23 TherEx NuStep L5 X 8 min Leg Press 87# 3x10; single leg 37# 3x10 bil TRX squats 3x10 Mini crunch 2x10 with hands on thighs Table top reverse crunch 2x10   10/11/2023 Nu step L5 X 8 min Education on dynamic warmup and walking program of 3 short walks today adding up to 20 min cumulative for now then progressing slowly as tolerated. ER/Piriformis stretch (Figure 4 push) 3 x 20 seconds bilat seated Hamstrings stretch 3X20 sec bilat seated Hip hike at counter top 3 sets of 5 for 3 seconds Forward Step ups 6 inch X 10 bilat, no UE support Lateral step ups 6 inch step X 10 bilat Suitcase carry 5# one lap in gym 120 feet each side Pball roll up wall stretching into thoracolumbar extension 5 sec X 10  Neuro RE-ed Blaze pods 30 sec X2 holding tandem balance and using UE to turn pods off, 4 pods at window  Blaze pods SLS using LE to turn off 30 sec X 2 with frequent touchdowns as needed    PATIENT EDUCATION: Education details: HEP, PT plan of care Person educated: Patient Education method: Explanation, Demonstration, Verbal cues, and Handouts Education comprehension: verbalized understanding and needs further education   HOME EXERCISE PROGRAM: Access Code: [email or web address] URL:  [email or web address] Date: 10/21/2023 Prepared by: Pauletta Browns  Exercises - Modified Erby Pian (Mirrored)  - 2 x daily - 6 x weekly - 1 sets - 10 reps - 10 hold - straight leg raise with help from strap  - 2 x daily - 6 x weekly - 1 sets - 3 reps - 20 sec hold - Supine Lower Trunk Rotation  - 2 x daily - 6 x weekly - 1 sets - 5 reps - 10 sec hold - Hooklying Single Knee to Chest Stretch  - 2 x daily - 6 x weekly - 1 sets - 3 reps - 20 sec hold - Standing Marching  - 2 x daily - 6 x weekly - 1-2 sets - 10 reps - Standing Hip Abduction with Counter Support  - 2 x daily - 6 x weekly - 1-2 sets - 10 reps - Standing Hip Extension with Counter Support  - 2 x daily - 6 x weekly - 1-2 sets - 10 reps - Standing Lumbar Extension  - 2 x daily - 6 x weekly - 1-2 sets - 10 reps - 5 hold - Hooklying Single Leg Bent Knee Fallouts with Resistance  - 1 x daily - 6 x weekly - 2 sets - 10 reps - Sidelying Hip Circles  - 2 x daily - 6 x weekly - 2 sets - 10 circles each way - Clamshell with Resistance  -  1 x daily - 7 x weekly - 2 sets - 10 reps - Hooklying Isometric Hip Flexion with Opposite Arm  - 1 x daily - 6 x weekly - 2 sets - 10 reps - Curl Up with Reach  - 1 x daily - 6 x weekly - 2 sets - 10 reps - Standing Hip Hiking  - 1 x daily - 7 x weekly - 3 sets - 10 reps - Seated Row Cable Machine  - 1 x daily - 6 x weekly - 3 sets - 10 reps - Lat Pull Down  - 1 x daily - 6 x weekly - 3 sets - 10 reps - Supine Hamstring Stretch  - 1 x daily - 6 x weekly - 1 sets - 4-5 reps - 20 seconds hold - Supine Figure 4 Piriformis Stretch  - 1 x daily - 7 x weekly - 1 sets - 4-5 reps - 20 seconds hold - Yoga Bridge  - 2 x daily - 7 x weekly - 1 sets - 10 reps - 5 seconds hold - Standing Hip Hiking  - 5 x daily - 7 x weekly - 1 sets - 10 reps - 3 seconds hold - DNS Bug Heel Touches  - 1 x daily - 6 x weekly - 3 sets - 10 reps - Swiss Ball Reverse Crunch  - 1 x daily - 6 x weekly - 3 sets - 10 reps -  Prone Hip Extension  - 1 x daily - 7 x weekly - 1-2 sets - 10 reps - 3 seconds hold  ASSESSMENT:  CLINICAL IMPRESSION:  Shanquia notes she is "about 50% back to normal" which is good for 8 weeks post-surgery.  We discussed prioritizing lumbar extensors, quadratus lumborum and hip abductors strength along with walking and careful attention to posture and body mechanics while she is on her road trip to [place].  She will benefit from a follow-up reassessment upon return to [facility] to compare where she is functioning 3 months post-surgery as compared to what we expect 3 months post-surgery (70-75% of "normal" expected 3 months post-surgery).  OBJECTIVE IMPAIRMENTS: decreased activity tolerance, difficulty walking, decreased balance, decreased endurance, decreased mobility, decreased ROM, decreased strength, impaired flexibility, impaired LE use, postural dysfunction, and pain.  ACTIVITY LIMITATIONS: bending, lifting, carry, locomotion, cleaning, community activity, driving  PERSONAL FACTORS: post op status are also affecting patient's functional outcome.  REHAB POTENTIAL: Good  CLINICAL DECISION MAKING: Stable/uncomplicated  EVALUATION COMPLEXITY: Low    GOALS: Short term PT Goals Target date: 10/25/2023  Pt will be I and compliant with HEP. Baseline:  Goal status: Met 10/21/2023  Pt will decrease pain by 25% overall Baseline: Goal status: On Going 10/21/2023  Long term PT goals Target date:11/08/2023   Pt will improve lumbar AROM to [facility] to improve functional mobility Baseline: Goal status: ongoing 10/11/23 Pt will improve  hip/knee strength to at least 5/5 MMT ins sitting to improve functional strength Baseline: Goal status: ongoing 10/11/23 Pt will improve FOTO to at least 54% functional to show improved function Baseline: Goal status: ongoing 10/11/23 Pt will reduce pain to overall less than 2-3/10 with usual activity and work activity. Baseline: Goal status: ongoing  10/21/23 Pt will be able to ambulate community distances at least 1000 ft WNL gait pattern without complaints Baseline: Goal status: ongoing 10/11/23  PLAN: PT FREQUENCY: 1-2 times per week   PT DURATION: 4-6 weeks  PLANNED INTERVENTIONS (unless contraindicated): aquatic PT, Canalith repositioning, cryotherapy, Electrical stimulation, Iontophoresis  with 4 mg/ml dexamethasome, Moist heat, traction, Ultrasound, gait training, Therapeutic exercise, balance training, neuromuscular re-education, patient/family education, , manual techniques, passive ROM, dry needling, taping, joint manipulations 97110-Therapeutic exercises, 97530- Therapeutic activity, 97112- Neuromuscular re-education, 97535- Self Care, and [id number]- Manual therapy  PLAN FOR NEXT SESSION: Progress note on 8th visit as 8 visits were requested.  NEXT MD VISIT: 11/22/23  Cherlyn Cushing, PT, MPT 10/21/2023, 11:17 AM

## 2023-10-25 ENCOUNTER — Encounter: Payer: Self-pay | Admitting: Physical Therapy

## 2023-10-25 ENCOUNTER — Ambulatory Visit: Payer: PPO | Admitting: Physical Therapy

## 2023-10-25 DIAGNOSIS — M6281 Muscle weakness (generalized): Secondary | ICD-10-CM

## 2023-10-25 DIAGNOSIS — M5459 Other low back pain: Secondary | ICD-10-CM

## 2023-10-25 DIAGNOSIS — R262 Difficulty in walking, not elsewhere classified: Secondary | ICD-10-CM

## 2023-10-25 NOTE — Therapy (Signed)
OUTPATIENT PHYSICAL THERAPY TREATMENT   Patient Name: Jessica Mack MRN: [id number] DOB:Nov 17, 1955, 67 y.o., female Today's Date: 10/25/2023  END OF SESSION:  PT End of Session - 10/25/23 1043     Visit Number 8    Number of Visits 8    Date for PT Re-Evaluation 11/08/23    Progress Note Due on Visit 10    PT Start Time 1017    PT Stop Time 1100    PT Time Calculation (min) 43 min    Activity Tolerance Patient tolerated treatment well;No increased pain    Behavior During Therapy [facility] for tasks assessed/performed              Past Medical History:  Diagnosis Date   Avascular necrosis of bone (HCC)    Left talus   Blood transfusion    Breast cancer (HCC) 2005   left   Cancer (HCC)    Dysplastic nevus 1988   right hip   Fatty liver    GERD (gastroesophageal reflux disease)    History of breast cancer 2005   invasive ductal cancer left breast   Hypertension    Hypertriglyceridemia    Irritable bowel syndrome    Metabolic syndrome    resolved with diet and exercise   NASH (nonalcoholic steatohepatitis)    Nocturnal leg cramps 02/14/2015   Osteopenia    Palpitations 09/2012   normal echo   Peripheral neuropathy    Personal history of chemotherapy    Personal history of radiation therapy    Plantar fasciitis 03/2003   PONV (postoperative nausea and vomiting)    Tongue dysplasia    Ulcer    Past Surgical History:  Procedure Laterality Date   ANKLE SURGERY  07/2007   left, revascularization   BREAST LUMPECTOMY  06/2004   left, with port placement   CESAREAN SECTION  1991, 1993   x2   COLONOSCOPY  09/2013   Dr. Loreta Ave   DG GALL BLADDER     IRRIGATION AND DEBRIDEMENT ELBOW Left 05/04/2023   Procedure: LEFT ELBOW IRRIGATION AND DEBRIDEMENT;  Surgeon: [name], MD;  Location: [facility];  Service: Orthopedics;  Laterality: Left;   LAPAROSCOPIC CHOLECYSTECTOMY  2003   LUMBAR LAMINECTOMY Right 08/24/2023   Procedure: LUMBAR FOUR-FIVE  MICRODISCECTOMY, LUMBAR FIVE-SACRAL ONE RIGHT FORAMINOTOMY;  Surgeon: London Sheer, MD;  Location: [facility] OR;  Service: Orthopedics;  Laterality: Right;   MOUTH SURGERY  11/15   implant/crown placed 4/16   SHOULDER ARTHROSCOPY  10/2005   right   SHOULDER SURGERY  12/15   left   TONGUE SURGERY  2012, 2013   WRIST SURGERY  1988   right, DeQuervains   Patient Active Problem List   Diagnosis Date Noted   Anemia 09/28/2023   Radiculopathy, lumbar region 08/24/2023   Elevated ferritin level 04/05/2023   NASH (nonalcoholic steatohepatitis) 06/04/2020   Hypertriglyceridemia    Routine general medical examination at a health care facility 02/20/2020   Essential hypertension 02/20/2020   Osteopenia 08/05/2014   Breast cancer of upper-outer quadrant of left female breast (HCC) 07/20/2013   Leukoplakia of oral mucosa, including tongue 07/26/2012   Essential and other specified forms of tremor 07/26/2012   Drug-induced polyneuropathy (HCC) 07/26/2012    PCP: Etta Grandchild, MD  REFERRING PROVIDER: London Sheer, MD   REFERRING DIAG: Diagnosis M54.16 (ICD-10-CM) - Lumbar radiculopathy  Rationale for Evaluation and Treatment: Rehabilitation  THERAPY DIAG:  Other low back pain  Muscle weakness (generalized)  Difficulty in walking, not elsewhere classified  ONSET DATE: 08/24/23 L4/5 microdiscectomy with right-sided hemilaminotomy, Right L5/S1 foraminotomy  SUBJECTIVE:                                                                                                                                                                                           SUBJECTIVE STATEMENT: Jessica Mack notes some cramping and stiffness, not as much pain overall. She will take off for road trip for next 3 weeks and will follow up with PT after this.   PERTINENT HISTORY:  Recent back surgery Left talus avascular necrosis, breast cancer, peripheral neuropathy, ankle, shoulder and wrist surgeries, left elbow  olecranon bursectomy  PAIN:  Are you having pain? Yes: NPRS scale: 3-4/10 Pain location: Mild low back, Gluteal/hip abductors and distal to the foot on the left Pain description: Ache going down her left leg, occasional sharp pain on the right Aggravating factors: Standing or stairs without support, lying prone Relieving factors: Heat, flexion  PRECAUTIONS: Back  RED FLAGS: None   WEIGHT BEARING RESTRICTIONS: No  FALLS:  Has patient fallen in last 6 months? Yes. Number of falls 1 with pickle ball   OCCUPATION: Retired [employer] information [employer] manager, very physically active but has not been able to do anything active since surgery  PLOF: Independent  PATIENT GOALS: Be active again without pain limiting her return to walking, Pilates and pickle ball   OBJECTIVE:  Note: Objective measures were completed at Evaluation unless otherwise noted.  DIAGNOSTIC FINDINGS:    PATIENT SURVEYS:  Eval FOTO 34% functional, goal is 54%  SCREENING FOR RED FLAGS: Bowel or bladder incontinence: No   COGNITION: Overall cognitive status: Within functional limits for tasks assessed      MUSCLE LENGTH: Hamstrings: moderately tight bilat  10/08/2023   10/21/2023 Hip flexors: 115/115  Hamstrings: 45/40  50/50 Hip IR:   14/14 Hip ER:   32/37  27/41   LUMBAR ROM:   AROM 09/27/2023 10/11/23  Flexion 90%   Extension 25% 50% observed with exercises  Right lateral flexion 50%   Left lateral flexion 50%   Right rotation 50%   Left rotation 50%    (Blank rows = not tested)   STRENGTH:   STRENGTH MMT in sitting Left/Right 09/27/2023   Hip flexion 3+/4   Hip extension    Hip abduction 4/4   Hip adduction    Hip internal rotation    Hip external rotation    Knee flexion 4+/4+   Knee extension 4+/4+   Ankle dorsiflexion 4+/4+   Ankle plantarflexion 4+/4+   Ankle inversion    Ankle eversion  Lumbar extension     (Blank rows = not tested)   GAIT: Eval: antalgic gait with limited distances  due to pain, wider Base of support noted  TODAY'S TREATMENT:  10/25/23 Nu step L5 X 8 min UE/LE Bridging 10 x 5 seconds Prone alternating hip extensions 10 x 3 seconds (left knee flexed to reduce difficulty some) Hamstrings stretch 3 x 30 seconds Low trunk rotations 5 sec X 10 Side-lie clams blue X 20 bilat Standing hip extensions X 10 Standing posterior-lateral hip extensions X 10 HEP review and guidance  10/21/2023 Bridging 10 x 5 seconds Prone alternating hip extensions 10 x 3 seconds Hamstrings stretch 2 x 20 seconds ER/Figure 4 stretch 4 x 20 seconds Side-lie clams (lie Rt, lift Left) with Red Thera-band 20 x 3 seconds Hip hike 2 sets of 10 x 3 seconds at counter top  Functional Activities: Reviewed log roll, lifting techniques, cues for driving/comfort with her upcoming road trip to/from [place].  Answered questions about her left sided symptoms and reviewed HEP with emphasis on 7 x a week exercises to address current left sided concerns.   10/13/23 TherEx NuStep L5 X 8 min Leg Press 87# 3x10; single leg 37# 3x10 bil TRX squats 3x10 Mini crunch 2x10 with hands on thighs Table top reverse crunch 2x10   10/11/2023 Nu step L5 X 8 min Education on dynamic warmup and walking program of 3 short walks today adding up to 20 min cumulative for now then progressing slowly as tolerated. ER/Piriformis stretch (Figure 4 push) 3 x 20 seconds bilat seated Hamstrings stretch 3X20 sec bilat seated Hip hike at counter top 3 sets of 5 for 3 seconds Forward Step ups 6 inch X 10 bilat, no UE support Lateral step ups 6 inch step X 10 bilat Suitcase carry 5# one lap in gym 120 feet each side Pball roll up wall stretching into thoracolumbar extension 5 sec X 10  Neuro RE-ed Blaze pods 30 sec X2 holding tandem balance and using UE to turn pods off, 4 pods at window  Blaze pods SLS using LE to turn off 30 sec X 2 with frequent touchdowns as needed    PATIENT EDUCATION: Education details:  HEP, PT plan of care Person educated: Patient Education method: Explanation, Demonstration, Verbal cues, and Handouts Education comprehension: verbalized understanding and needs further education   HOME EXERCISE PROGRAM: Access Code: [email or web address] URL: [email or web address] Date: 10/21/2023 Prepared by: Pauletta Browns  Exercises - Modified Erby Pian (Mirrored)  - 2 x daily - 6 x weekly - 1 sets - 10 reps - 10 hold - straight leg raise with help from strap  - 2 x daily - 6 x weekly - 1 sets - 3 reps - 20 sec hold - Supine Lower Trunk Rotation  - 2 x daily - 6 x weekly - 1 sets - 5 reps - 10 sec hold - Hooklying Single Knee to Chest Stretch  - 2 x daily - 6 x weekly - 1 sets - 3 reps - 20 sec hold - Standing Marching  - 2 x daily - 6 x weekly - 1-2 sets - 10 reps - Standing Hip Abduction with Counter Support  - 2 x daily - 6 x weekly - 1-2 sets - 10 reps - Standing Hip Extension with Counter Support  - 2 x daily - 6 x weekly - 1-2 sets - 10 reps - Standing Lumbar Extension  - 2 x daily - 6 x weekly - 1-2 sets - 10  reps - 5 hold - Hooklying Single Leg Bent Knee Fallouts with Resistance  - 1 x daily - 6 x weekly - 2 sets - 10 reps - Sidelying Hip Circles  - 2 x daily - 6 x weekly - 2 sets - 10 circles each way - Clamshell with Resistance  - 1 x daily - 7 x weekly - 2 sets - 10 reps - Hooklying Isometric Hip Flexion with Opposite Arm  - 1 x daily - 6 x weekly - 2 sets - 10 reps - Curl Up with Reach  - 1 x daily - 6 x weekly - 2 sets - 10 reps - Standing Hip Hiking  - 1 x daily - 7 x weekly - 3 sets - 10 reps - Seated Row Cable Machine  - 1 x daily - 6 x weekly - 3 sets - 10 reps - Lat Pull Down  - 1 x daily - 6 x weekly - 3 sets - 10 reps - Supine Hamstring Stretch  - 1 x daily - 6 x weekly - 1 sets - 4-5 reps - 20 seconds hold - Supine Figure 4 Piriformis Stretch  - 1 x daily - 7 x weekly - 1 sets - 4-5 reps - 20 seconds hold - Yoga Bridge  - 2 x daily - 7 x weekly - 1 sets -  10 reps - 5 seconds hold - Standing Hip Hiking  - 5 x daily - 7 x weekly - 1 sets - 10 reps - 3 seconds hold - DNS Bug Heel Touches  - 1 x daily - 6 x weekly - 3 sets - 10 reps - Swiss Ball Reverse Crunch  - 1 x daily - 6 x weekly - 3 sets - 10 reps - Prone Hip Extension  - 1 x daily - 7 x weekly - 1-2 sets - 10 reps - 3 seconds hold  ASSESSMENT:  CLINICAL IMPRESSION:  Natisha will take next 3 weeks off for long road trip. She will follow up after this with PT and she will need recert then and MD progress note as she will see MD after PT.  We did review all of her HEP with recommendations to choose 2 stretches and 4 strengthening exercises to do at one time and to rotate through all the other ones but keeping it to 6 exercises at one time to improve time management and compliance as she has long list of exercises that have been prescribed to her by other PT's as well.   OBJECTIVE IMPAIRMENTS: decreased activity tolerance, difficulty walking, decreased balance, decreased endurance, decreased mobility, decreased ROM, decreased strength, impaired flexibility, impaired LE use, postural dysfunction, and pain.  ACTIVITY LIMITATIONS: bending, lifting, carry, locomotion, cleaning, community activity, driving  PERSONAL FACTORS: post op status are also affecting patient's functional outcome.  REHAB POTENTIAL: Good  CLINICAL DECISION MAKING: Stable/uncomplicated  EVALUATION COMPLEXITY: Low    GOALS: Short term PT Goals Target date: 10/25/2023  Pt will be I and compliant with HEP. Baseline:  Goal status: Met 10/21/2023  Pt will decrease pain by 25% overall Baseline: Goal status: On Going 10/21/2023  Long term PT goals Target date:11/08/2023   Pt will improve lumbar AROM to [facility] to improve functional mobility Baseline: Goal status: ongoing 10/11/23 Pt will improve  hip/knee strength to at least 5/5 MMT ins sitting to improve functional strength Baseline: Goal status: ongoing 10/11/23 Pt  will improve FOTO to at least 54% functional to show improved function Baseline: Goal status:  ongoing 10/11/23 Pt will reduce pain to overall less than 2-3/10 with usual activity and work activity. Baseline: Goal status: ongoing 10/21/23 Pt will be able to ambulate community distances at least 1000 ft WNL gait pattern without complaints Baseline: Goal status: ongoing 10/11/23  PLAN: PT FREQUENCY: 1-2 times per week   PT DURATION: 4-6 weeks  PLANNED INTERVENTIONS (unless contraindicated): aquatic PT, Canalith repositioning, cryotherapy, Electrical stimulation, Iontophoresis with 4 mg/ml dexamethasome, Moist heat, traction, Ultrasound, gait training, Therapeutic exercise, balance training, neuromuscular re-education, patient/family education, , manual techniques, passive ROM, dry needling, taping, joint manipulations 97110-Therapeutic exercises, 97530- Therapeutic activity, 97112- Neuromuscular re-education, 97535- Self Care, and [id number]- Manual therapy  PLAN FOR NEXT SESSION: Progress note and recert but also check to see if she is ready to transition to independent program.  NEXT MD VISIT: 11/22/23  April Manson, PT, DPT 10/25/2023, 10:45 AM

## 2023-11-18 ENCOUNTER — Other Ambulatory Visit: Payer: Self-pay | Admitting: Internal Medicine

## 2023-11-18 DIAGNOSIS — G620 Drug-induced polyneuropathy: Secondary | ICD-10-CM

## 2023-11-18 DIAGNOSIS — G4762 Sleep related leg cramps: Secondary | ICD-10-CM

## 2023-11-18 MED ORDER — CLONAZEPAM 1 MG PO TABS: 1.0000 mg | ORAL_TABLET | Freq: Every day | ORAL | 1 refills | Status: DC

## 2023-11-22 ENCOUNTER — Ambulatory Visit: Payer: PPO | Admitting: Physical Therapy

## 2023-11-22 ENCOUNTER — Other Ambulatory Visit (INDEPENDENT_AMBULATORY_CARE_PROVIDER_SITE_OTHER): Payer: Self-pay

## 2023-11-22 ENCOUNTER — Ambulatory Visit (INDEPENDENT_AMBULATORY_CARE_PROVIDER_SITE_OTHER): Payer: PPO | Admitting: Orthopedic Surgery

## 2023-11-22 ENCOUNTER — Encounter: Payer: Self-pay | Admitting: Physical Therapy

## 2023-11-22 DIAGNOSIS — M5459 Other low back pain: Secondary | ICD-10-CM | POA: Diagnosis not present

## 2023-11-22 DIAGNOSIS — M6281 Muscle weakness (generalized): Secondary | ICD-10-CM

## 2023-11-22 DIAGNOSIS — R262 Difficulty in walking, not elsewhere classified: Secondary | ICD-10-CM

## 2023-11-22 DIAGNOSIS — R293 Abnormal posture: Secondary | ICD-10-CM

## 2023-11-22 DIAGNOSIS — Z9889 Other specified postprocedural states: Secondary | ICD-10-CM

## 2023-11-22 NOTE — Therapy (Addendum)
 OUTPATIENT PHYSICAL THERAPY TREATMENT DISCHARGE SUMMARY   Patient Name: Jessica Mack MRN: [id number] DOB:1956-06-25, 68 y.o., female Today's Date: 11/22/2023  END OF SESSION:  PT End of Session - 11/22/23 1232     Visit Number 9    Date for PT Re-Evaluation 12/20/23    Progress Note Due on Visit 10    PT Start Time 1145    PT Stop Time 1227    PT Time Calculation (min) 42 min    Activity Tolerance Patient tolerated treatment well;No increased pain    Behavior During Therapy [facility] for tasks assessed/performed               Past Medical History:  Diagnosis Date   Avascular necrosis of bone (HCC)    Left talus   Blood transfusion    Breast cancer (HCC) 2005   left   Cancer (HCC)    Dysplastic nevus 1988   right hip   Fatty liver    GERD (gastroesophageal reflux disease)    History of breast cancer 2005   invasive ductal cancer left breast   Hypertension    Hypertriglyceridemia    Irritable bowel syndrome    Metabolic syndrome    resolved with diet and exercise   NASH (nonalcoholic steatohepatitis)    Nocturnal leg cramps 02/14/2015   Osteopenia    Palpitations 09/2012   normal echo   Peripheral neuropathy    Personal history of chemotherapy    Personal history of radiation therapy    Plantar fasciitis 03/2003   PONV (postoperative nausea and vomiting)    Tongue dysplasia    Ulcer    Past Surgical History:  Procedure Laterality Date   ANKLE SURGERY  07/2007   left, revascularization   BREAST LUMPECTOMY  06/2004   left, with port placement   CESAREAN SECTION  1991, 1993   x2   COLONOSCOPY  09/2013   Dr. Tova Fresh   DG GALL BLADDER     IRRIGATION AND DEBRIDEMENT ELBOW Left 05/04/2023   Procedure: LEFT ELBOW IRRIGATION AND DEBRIDEMENT;  Surgeon: Wilhelmenia Harada, MD;  Location: [facility];  Service: Orthopedics;  Laterality: Left;   LAPAROSCOPIC CHOLECYSTECTOMY  2003   LUMBAR LAMINECTOMY Right 08/24/2023   Procedure: LUMBAR FOUR-FIVE  MICRODISCECTOMY, LUMBAR FIVE-SACRAL ONE RIGHT FORAMINOTOMY;  Surgeon: Diedra Fowler, MD;  Location: [facility] OR;  Service: Orthopedics;  Laterality: Right;   MOUTH SURGERY  11/15   implant/crown placed 4/16   SHOULDER ARTHROSCOPY  10/2005   right   SHOULDER SURGERY  12/15   left   TONGUE SURGERY  2012, 2013   WRIST SURGERY  1988   right, DeQuervains   Patient Active Problem List   Diagnosis Date Noted   Anemia 09/28/2023   Radiculopathy, lumbar region 08/24/2023   Elevated ferritin level 04/05/2023   NASH (nonalcoholic steatohepatitis) 06/04/2020   Hypertriglyceridemia    Routine general medical examination at a health care facility 02/20/2020   Essential hypertension 02/20/2020   Osteopenia 08/05/2014   Breast cancer of upper-outer quadrant of left female breast (HCC) 07/20/2013   Leukoplakia of oral mucosa, including tongue 07/26/2012   Essential and other specified forms of tremor 07/26/2012   Drug-induced polyneuropathy (HCC) 07/26/2012    PCP: Arcadio Knuckles, MD  REFERRING PROVIDER: Diedra Fowler, MD   REFERRING DIAG: Diagnosis M54.16 (ICD-10-CM) - Lumbar radiculopathy  Rationale for Evaluation and Treatment: Rehabilitation  THERAPY DIAG:  Other low back pain  Muscle weakness (generalized)  Difficulty in walking,  not elsewhere classified  Abnormal posture  ONSET DATE: 08/24/23 L4/5 microdiscectomy with right-sided hemilaminotomy, Right L5/S1 foraminotomy  SUBJECTIVE:                                                                                                                                                                                           SUBJECTIVE STATEMENT: Trip was much colder than expected so she didn't get to move as much as she would have liked.  Shocks down the leg have improved; Lt glute and lateral calf is more sore.    PERTINENT HISTORY:  Recent back surgery Left talus avascular necrosis, breast cancer, peripheral neuropathy, ankle,  shoulder and wrist surgeries, left elbow olecranon bursectomy  PAIN:  Are you having pain? Yes: NPRS scale: 0 currently; up to 3 over past few weeks/10 Pain location: Mild low back, Gluteal/hip abductors and distal to the foot on the left Pain description: Ache going down her left leg, occasional sharp pain on the right Aggravating factors: Standing or stairs without support, lying prone Relieving factors: Heat, flexion  PRECAUTIONS: Back  RED FLAGS: None   WEIGHT BEARING RESTRICTIONS: No  FALLS:  Has patient fallen in last 6 months? Yes. Number of falls 1 with pickle ball   OCCUPATION: Retired [employer] information [employer] manager, very physically active but has not been able to do anything active since surgery  PLOF: Independent  PATIENT GOALS: Be active again without pain limiting her return to walking, Pilates and pickle ball   OBJECTIVE:  Note: Objective measures were completed at Evaluation unless otherwise noted.  DIAGNOSTIC FINDINGS:    PATIENT SURVEYS:  Eval FOTO 34% functional, goal is 54% 11/22/23: FOTO 60  SCREENING FOR RED FLAGS: Bowel or bladder incontinence: No   COGNITION: Overall cognitive status: Within functional limits for tasks assessed      MUSCLE LENGTH: Hamstrings: moderately tight bilat  10/08/2023   10/21/2023 Hip flexors: 115/115  Hamstrings: 45/40  50/50 Hip IR:   14/14 Hip ER:   32/37  27/41   LUMBAR ROM:   AROM 09/27/2023 10/11/23 11/22/23  Flexion 90%  WNL  Extension 25% 50% observed with exercises WNL  Right lateral flexion 50%  WNL  Left lateral flexion 50%  WNL  Right rotation 50%  WNL  Left rotation 50%  WNL   (Blank rows = not tested)   STRENGTH:   STRENGTH MMT in sitting Left/Right 09/27/2023 Left/Right 11/22/23  Hip flexion 3+/4 4/5  Hip extension    Hip abduction 4/4 5/5  Hip adduction    Hip internal rotation    Hip external rotation    Knee flexion 4+/4+ 5/5  Knee  extension 4+/4+ 5/5  Ankle dorsiflexion 4+/4+ 5/5  Ankle  plantarflexion 4+/4+ 5/5   (Blank rows = not tested)   GAIT: Eval: antalgic gait with limited distances due to pain, wider Base of support noted  TODAY'S TREATMENT:  11/22/23 Nu step L5 X 8 min UE/LE Goal assessment - see above and below for details Review of current HEP and trial of planks, cat/cow, various yoga poses with good tolerance.  Reassured pt she can move within her tolerance at this time and to work on core/back/hip strengthening  10/25/23 Nu step L5 X 8 min UE/LE Bridging 10 x 5 seconds Prone alternating hip extensions 10 x 3 seconds (left knee flexed to reduce difficulty some) Hamstrings stretch 3 x 30 seconds Low trunk rotations 5 sec X 10 Side-lie clams blue X 20 bilat Standing hip extensions X 10 Standing posterior-lateral hip extensions X 10 HEP review and guidance  10/21/2023 Bridging 10 x 5 seconds Prone alternating hip extensions 10 x 3 seconds Hamstrings stretch 2 x 20 seconds ER/Figure 4 stretch 4 x 20 seconds Side-lie clams (lie Rt, lift Left) with Red Thera-band 20 x 3 seconds Hip hike 2 sets of 10 x 3 seconds at counter top  Functional Activities: Reviewed log roll, lifting techniques, cues for driving/comfort with her upcoming road trip to/from [place] .  Answered questions about her left sided symptoms and reviewed HEP with emphasis on 7 x a week exercises to address current left sided concerns.   10/13/23 TherEx NuStep L5 X 8 min Leg Press 87# 3x10; single leg 37# 3x10 bil TRX squats 3x10 Mini crunch 2x10 with hands on thighs Table top reverse crunch 2x10   10/11/2023 Nu step L5 X 8 min Education on dynamic warmup and walking program of 3 short walks today adding up to 20 min cumulative for now then progressing slowly as tolerated. ER/Piriformis stretch (Figure 4 push) 3 x 20 seconds bilat seated Hamstrings stretch 3X20 sec bilat seated Hip hike at counter top 3 sets of 5 for 3 seconds Forward Step ups 6 inch X 10 bilat, no UE  support Lateral step ups 6 inch step X 10 bilat Suitcase carry 5# one lap in gym 120 feet each side Pball roll up wall stretching into thoracolumbar extension 5 sec X 10  Neuro RE-ed Blaze pods 30 sec X2 holding tandem balance and using UE to turn pods off, 4 pods at window  Blaze pods SLS using LE to turn off 30 sec X 2 with frequent touchdowns as needed    PATIENT EDUCATION: Education details: HEP, PT plan of care Person educated: Patient Education method: Explanation, Demonstration, Verbal cues, and Handouts Education comprehension: verbalized understanding and needs further education   HOME EXERCISE PROGRAM: Access Code: [email or web address] URL: [email or web address] Date: 10/21/2023 Prepared by: Terral Ferrari  Exercises - Modified Becki Bouton (Mirrored)  - 2 x daily - 6 x weekly - 1 sets - 10 reps - 10 hold - straight leg raise with help from strap  - 2 x daily - 6 x weekly - 1 sets - 3 reps - 20 sec hold - Supine Lower Trunk Rotation  - 2 x daily - 6 x weekly - 1 sets - 5 reps - 10 sec hold - Hooklying Single Knee to Chest Stretch  - 2 x daily - 6 x weekly - 1 sets - 3 reps - 20 sec hold - Standing Marching  - 2 x daily - 6 x weekly - 1-2 sets - 10 reps - Standing  Hip Abduction with Counter Support  - 2 x daily - 6 x weekly - 1-2 sets - 10 reps - Standing Hip Extension with Counter Support  - 2 x daily - 6 x weekly - 1-2 sets - 10 reps - Standing Lumbar Extension  - 2 x daily - 6 x weekly - 1-2 sets - 10 reps - 5 hold - Hooklying Single Leg Bent Knee Fallouts with Resistance  - 1 x daily - 6 x weekly - 2 sets - 10 reps - Sidelying Hip Circles  - 2 x daily - 6 x weekly - 2 sets - 10 circles each way - Clamshell with Resistance  - 1 x daily - 7 x weekly - 2 sets - 10 reps - Hooklying Isometric Hip Flexion with Opposite Arm  - 1 x daily - 6 x weekly - 2 sets - 10 reps - Curl Up with Reach  - 1 x daily - 6 x weekly - 2 sets - 10 reps - Standing Hip Hiking  - 1 x daily - 7  x weekly - 3 sets - 10 reps - Seated Row Cable Machine  - 1 x daily - 6 x weekly - 3 sets - 10 reps - Lat Pull Down  - 1 x daily - 6 x weekly - 3 sets - 10 reps - Supine Hamstring Stretch  - 1 x daily - 6 x weekly - 1 sets - 4-5 reps - 20 seconds hold - Supine Figure 4 Piriformis Stretch  - 1 x daily - 7 x weekly - 1 sets - 4-5 reps - 20 seconds hold - Yoga Bridge  - 2 x daily - 7 x weekly - 1 sets - 10 reps - 5 seconds hold - Standing Hip Hiking  - 5 x daily - 7 x weekly - 1 sets - 10 reps - 3 seconds hold - DNS Bug Heel Touches  - 1 x daily - 6 x weekly - 3 sets - 10 reps - Swiss Ball Reverse Crunch  - 1 x daily - 6 x weekly - 3 sets - 10 reps - Prone Hip Extension  - 1 x daily - 7 x weekly - 1-2 sets - 10 reps - 3 seconds hold  ASSESSMENT:  CLINICAL IMPRESSION:  Pt tolerated session well today and has met all goals at this time.  Will hold PT and pt can return within 30 days if needed; if she doesn't return, will d/c PT.  OBJECTIVE IMPAIRMENTS: decreased activity tolerance, difficulty walking, decreased balance, decreased endurance, decreased mobility, decreased ROM, decreased strength, impaired flexibility, impaired LE use, postural dysfunction, and pain.  ACTIVITY LIMITATIONS: bending, lifting, carry, locomotion, cleaning, community activity, driving  PERSONAL FACTORS: post op status are also affecting patient's functional outcome.  REHAB POTENTIAL: Good  CLINICAL DECISION MAKING: Stable/uncomplicated  EVALUATION COMPLEXITY: Low    GOALS: Short term PT Goals Target date: 10/25/2023  Pt will be I and compliant with HEP. Baseline:  Goal status: Met 10/21/2023  Pt will decrease pain by 25% overall Baseline: Goal status: On Going 10/21/2023  Long term PT goals Target date:11/08/2023   Pt will improve lumbar AROM to [facility] to improve functional mobility Baseline: Goal status: MET 11/22/23 Pt will improve  hip/knee strength to at least 5/5 MMT ins sitting to improve  functional strength Baseline: Goal status:  MET 11/22/23 Pt will improve FOTO to at least 54% functional to show improved function Baseline: Goal status: MET 11/22/23 Pt will reduce pain to  overall less than 2-3/10 with usual activity and work activity. Baseline: Goal status:  MET 11/22/23 Pt will be able to ambulate community distances at least 1000 ft WNL gait pattern without complaints Baseline: Goal status:  MET 11/22/23  PLAN: PT FREQUENCY: 1-2 times per week   PT DURATION: 4-6 weeks  PLANNED INTERVENTIONS (unless contraindicated): aquatic PT, Canalith repositioning, cryotherapy, Electrical stimulation, Iontophoresis with 4 mg/ml dexamethasome, Moist heat, traction, Ultrasound, gait training, Therapeutic exercise, balance training, neuromuscular re-education, patient/family education, , manual techniques, passive ROM, dry needling, taping, joint manipulations 97110-Therapeutic exercises, 97530- Therapeutic activity, 97112- Neuromuscular re-education, 97535- Self Care, and 60109- Manual therapy  PLAN FOR NEXT SESSION: hold PT, recert or d/c based on return    NEXT MD VISIT: 11/22/23  Casimer Clear, PT, DPT 11/22/2023, 12:38 PM    PHYSICAL THERAPY DISCHARGE SUMMARY  Visits from Start of Care: 9  Current functional level related to goals / functional outcomes: See above   Remaining deficits: See above   Education / Equipment: HEP   Patient agrees to discharge. Patient goals were met. Patient is being discharged due to meeting the stated rehab goals...   Marley Simmers, PT, DPT 02/14/24 3:06 PM

## 2023-11-22 NOTE — Progress Notes (Signed)
Orthopedic Surgery Post-operative Office Visit   Procedure: right L4/5 hemilaminotomy and microdiscectomy, right L5/S1 foraminotomy Date of Surgery: 08/24/2023 (~3 months post-op)   Assessment: Patient is a 68 y.o. who is doing well after surgery.  Not having any significant back pain and radicular leg pain has resolved     Plan: -Operative plans complete -No spine specific precautions, activity as tolerated -Patient has weaned off of gabapentin and Flexeril.  Can use over-the-counter medications as needed for pain relief -Continue home exercise and stretching program -Return to office in 3 months, x-rays needed at next visit: none   ___________________________________________________________________________     Subjective: Patient has been doing well since she was last seen.  She has been experiencing some back stiffness of late, but reports that she was spending a lot of time in the car driving long distances.  She is not having any pain radiating into her right lower extremity.  She does have some pain over the lateral aspect of the left leg.  She describes it as a cramping type sensation.  She is doing much better than where she was before surgery.  She is no longer taking any medications for pain.  She is ambulating without any assistive devices.  She is pleased with her surgical outcome at this point.   Objective:   General: no acute distress Neurologic: alert, answering questions appropriately, following commands Respiratory: unlabored breathing on room air Skin: incision is well-healed   MSK (spine):   -Strength exam                                                   Left                  Right   EHL                              5/5                  4/5 TA                                 5/5                  5/5 GSC                             5/5                  5/5 Knee extension            5/5                  5/5 Hip flexion                    5/5                   5/5   -Sensory exam                           Sensation intact to light touch in L3-S1 nerve distributions of bilateral lower extremities   Imaging: XRs of the lumbar spine  from 11/22/2023 were independently reviewed and interpreted, showing disc height loss at L3/4, L4/5, and L5/S1.  Most significant disc height loss noted at L4/5.  No fracture or dislocation seen.  No evidence of instability on flexion/extension views.     Patient name: Jessica Mack Patient MRN: [id number] Date of visit: 11/22/23

## 2023-11-23 ENCOUNTER — Other Ambulatory Visit (HOSPITAL_COMMUNITY): Payer: Self-pay

## 2023-11-25 ENCOUNTER — Other Ambulatory Visit: Payer: Self-pay | Admitting: Internal Medicine

## 2023-11-25 ENCOUNTER — Other Ambulatory Visit: Payer: Self-pay | Admitting: Orthopedic Surgery

## 2023-11-25 DIAGNOSIS — M5116 Intervertebral disc disorders with radiculopathy, lumbar region: Secondary | ICD-10-CM

## 2023-11-25 DIAGNOSIS — I10 Essential (primary) hypertension: Secondary | ICD-10-CM

## 2023-12-03 ENCOUNTER — Other Ambulatory Visit (HOSPITAL_COMMUNITY)
Admission: RE | Admit: 2023-12-03 | Discharge: 2023-12-03 | Disposition: A | Discharge status: Home / Self Care | Payer: Self-pay | Source: Ambulatory Visit | Attending: Medical Genetics | Admitting: Medical Genetics

## 2023-12-07 ENCOUNTER — Other Ambulatory Visit: Payer: Self-pay | Admitting: Gastroenterology

## 2023-12-07 DIAGNOSIS — R7989 Other specified abnormal findings of blood chemistry: Secondary | ICD-10-CM

## 2023-12-09 ENCOUNTER — Ambulatory Visit
Admission: RE | Admit: 2023-12-09 | Discharge: 2023-12-09 | Disposition: A | Discharge status: Home / Self Care | Payer: PPO | Source: Ambulatory Visit | Attending: Gastroenterology | Admitting: Gastroenterology

## 2023-12-09 DIAGNOSIS — R7989 Other specified abnormal findings of blood chemistry: Secondary | ICD-10-CM

## 2023-12-12 ENCOUNTER — Other Ambulatory Visit: Payer: Self-pay | Admitting: Internal Medicine

## 2023-12-12 DIAGNOSIS — E781 Pure hyperglyceridemia: Secondary | ICD-10-CM

## 2023-12-17 LAB — GENECONNECT MOLECULAR SCREEN: Genetic Analysis Overall Interpretation: NEGATIVE

## 2023-12-21 ENCOUNTER — Encounter: Payer: Self-pay | Admitting: Internal Medicine

## 2023-12-23 ENCOUNTER — Other Ambulatory Visit: Payer: Self-pay | Admitting: Internal Medicine

## 2023-12-23 DIAGNOSIS — E785 Hyperlipidemia, unspecified: Secondary | ICD-10-CM

## 2023-12-23 DIAGNOSIS — K7581 Nonalcoholic steatohepatitis (NASH): Secondary | ICD-10-CM

## 2023-12-23 DIAGNOSIS — D649 Anemia, unspecified: Secondary | ICD-10-CM

## 2023-12-23 DIAGNOSIS — I10 Essential (primary) hypertension: Secondary | ICD-10-CM

## 2023-12-23 DIAGNOSIS — R739 Hyperglycemia, unspecified: Secondary | ICD-10-CM

## 2023-12-23 DIAGNOSIS — R7989 Other specified abnormal findings of blood chemistry: Secondary | ICD-10-CM

## 2023-12-23 DIAGNOSIS — E781 Pure hyperglyceridemia: Secondary | ICD-10-CM

## 2023-12-27 ENCOUNTER — Other Ambulatory Visit (INDEPENDENT_AMBULATORY_CARE_PROVIDER_SITE_OTHER)

## 2023-12-27 DIAGNOSIS — R739 Hyperglycemia, unspecified: Secondary | ICD-10-CM

## 2023-12-27 DIAGNOSIS — K7581 Nonalcoholic steatohepatitis (NASH): Secondary | ICD-10-CM | POA: Diagnosis not present

## 2023-12-27 DIAGNOSIS — D649 Anemia, unspecified: Secondary | ICD-10-CM | POA: Diagnosis not present

## 2023-12-27 DIAGNOSIS — R7989 Other specified abnormal findings of blood chemistry: Secondary | ICD-10-CM

## 2023-12-27 DIAGNOSIS — I10 Essential (primary) hypertension: Secondary | ICD-10-CM

## 2023-12-27 DIAGNOSIS — E785 Hyperlipidemia, unspecified: Secondary | ICD-10-CM | POA: Diagnosis not present

## 2023-12-27 DIAGNOSIS — E781 Pure hyperglyceridemia: Secondary | ICD-10-CM

## 2023-12-27 LAB — HEMOGLOBIN A1C: Hgb A1c MFr Bld: 5.4 % (ref 4.6–6.5)

## 2023-12-27 LAB — BASIC METABOLIC PANEL
BUN: 20 mg/dL (ref 6–23)
CO2: 27 meq/L (ref 19–32)
Calcium: 9.6 mg/dL (ref 8.4–10.5)
Chloride: 103 meq/L (ref 96–112)
Creatinine, Ser: 0.77 mg/dL (ref 0.40–1.20)
GFR: 80.93 mL/min (ref >60.00)
Glucose, Bld: 97 mg/dL (ref 70–99)
Potassium: 4.3 meq/L (ref 3.5–5.1)
Sodium: 140 meq/L (ref 135–145)

## 2023-12-27 LAB — CBC WITH DIFFERENTIAL/PLATELET
Basophils Absolute: 0 10*3/uL (ref 0.0–0.1)
Basophils Relative: 1.2 % (ref 0.0–3.0)
Eosinophils Absolute: 0.1 10*3/uL (ref 0.0–0.7)
Eosinophils Relative: 3.2 % (ref 0.0–5.0)
HCT: 38.6 % (ref 36.0–46.0)
Hemoglobin: 13.1 g/dL (ref 12.0–15.0)
Lymphocytes Relative: 48.9 % — ABNORMAL HIGH (ref 12.0–46.0)
Lymphs Abs: 1.9 10*3/uL (ref 0.7–4.0)
MCHC: 33.9 g/dL (ref 30.0–36.0)
MCV: 93.3 fl (ref 78.0–100.0)
Monocytes Absolute: 0.4 10*3/uL (ref 0.1–1.0)
Monocytes Relative: 10.9 % (ref 3.0–12.0)
Neutro Abs: 1.4 10*3/uL (ref 1.4–7.7)
Neutrophils Relative %: 35.8 % — ABNORMAL LOW (ref 43.0–77.0)
Platelets: 236 10*3/uL (ref 150.0–400.0)
RBC: 4.13 Mil/uL (ref 3.87–5.11)
RDW: 13.3 % (ref 11.5–15.5)
WBC: 3.8 10*3/uL — ABNORMAL LOW (ref 4.0–10.5)

## 2023-12-27 LAB — IBC + FERRITIN
Ferritin: 305.5 ng/mL — ABNORMAL HIGH (ref 10.0–291.0)
Iron: 126 ug/dL (ref 42–145)
Saturation Ratios: 36.3 % (ref 20.0–50.0)
TIBC: 347.2 ug/dL (ref 250.0–450.0)
Transferrin: 248 mg/dL (ref 212.0–360.0)

## 2023-12-27 LAB — HEPATIC FUNCTION PANEL
ALT: 22 U/L (ref 0–35)
AST: 21 U/L (ref 0–37)
Albumin: 4.7 g/dL (ref 3.5–5.2)
Alkaline Phosphatase: 65 U/L (ref 39–117)
Bilirubin, Direct: 0.1 mg/dL (ref 0.0–0.3)
Total Bilirubin: 0.7 mg/dL (ref 0.2–1.2)
Total Protein: 6.9 g/dL (ref 6.0–8.3)

## 2023-12-27 LAB — LIPID PANEL
Cholesterol: 233 mg/dL — ABNORMAL HIGH (ref 0–200)
HDL: 58.9 mg/dL (ref >39.00)
LDL Cholesterol: 139 mg/dL — ABNORMAL HIGH (ref 0–99)
NonHDL: 173.67
Total CHOL/HDL Ratio: 4
Triglycerides: 173 mg/dL — ABNORMAL HIGH (ref 0.0–149.0)
VLDL: 34.6 mg/dL (ref 0.0–40.0)

## 2023-12-27 LAB — C-REACTIVE PROTEIN: CRP: <1 mg/dL (ref 0.5–20.0)

## 2023-12-27 LAB — TSH: TSH: 3.62 u[IU]/mL (ref 0.35–5.50)

## 2023-12-30 LAB — LIPOPROTEIN A (LPA): Lipoprotein (a): 13 nmol/L (ref <75)

## 2024-01-27 ENCOUNTER — Encounter: Payer: Self-pay | Admitting: Internal Medicine

## 2024-02-04 LAB — HM COLONOSCOPY

## 2024-02-14 ENCOUNTER — Ambulatory Visit
Admission: RE | Admit: 2024-02-14 | Discharge: 2024-02-14 | Disposition: A | Discharge status: Home / Self Care | Payer: PPO | Source: Ambulatory Visit | Attending: Obstetrics & Gynecology | Admitting: Obstetrics & Gynecology

## 2024-02-14 DIAGNOSIS — M8588 Other specified disorders of bone density and structure, other site: Secondary | ICD-10-CM

## 2024-02-17 ENCOUNTER — Ambulatory Visit: Payer: PPO | Admitting: Orthopedic Surgery

## 2024-02-17 DIAGNOSIS — M810 Age-related osteoporosis without current pathological fracture: Secondary | ICD-10-CM

## 2024-02-17 MED ORDER — BACLOFEN 10 MG PO TABS: 10.0000 mg | ORAL_TABLET | Freq: Two times a day (BID) | ORAL | 1 refills | Status: DC | PRN

## 2024-02-17 NOTE — Progress Notes (Signed)
 Orthopedic Surgery Post-operative Office Visit   Procedure: right L4/5 hemilaminotomy and microdiscectomy, right L5/S1 foraminotomy Date of Surgery: 08/24/2023 (~6 months post-op)   Assessment: Patient is a 68 y.o. who has improved significantly since surgery. Still has some decreased sensation over the lateral right leg and occasional shooting pains along the lateral leg and foot     Plan: -No spine specific precautions, activity as tolerated -OTC medications as needed for pain relief -Prescribed baclofen  for her spasm type pain since that has worked in the past when she was seeing neurology -Continue home exercise and stretching program -Return to office in 3 months, x-rays at next visit: none   ___________________________________________________________________________     Subjective: Patient has noticed significant improvement in her radiating leg pain since surgery.  She still has occasional shooting pains into the right lateral leg and foot.  These are not up on a daily basis.  They come on randomly.  She is not having any significant back pain.  She does not have any pain rating into the left lower extremity.  She has decreased sensation over the lateral leg on the right side which is different than her neuropathy.  She has chronic numbness and paresthesias in her feet.  No other numbness or paresthesias.  She also mention bilateral leg cramping that she particularly notes at night.  She had previously seen a neurologist who had been prescribing her baclofen .  She has found the baclofen  helpful in the past.    Objective:   General: no acute distress Neurologic: alert, answering questions appropriately, following commands Respiratory: unlabored breathing on room air Skin: incision is well healed   MSK (spine):   -Strength exam                                                   Left                  Right   EHL                              5/5                  4/5 TA                                  5/5                  5/5 GSC                             5/5                  5/5 Knee extension            5/5                  5/5 Hip flexion                    5/5                  5/5   -Sensory exam  Sensation intact to light touch in L3-S1 nerve distributions of bilateral lower extremities   Imaging: None obtained at today's visit     Patient name: Jessica Mack Patient MRN: [id number] Date of visit: 02/17/24

## 2024-02-29 ENCOUNTER — Ambulatory Visit: Admitting: Physician Assistant

## 2024-03-04 ENCOUNTER — Other Ambulatory Visit: Payer: Self-pay | Admitting: Internal Medicine

## 2024-03-04 DIAGNOSIS — I10 Essential (primary) hypertension: Secondary | ICD-10-CM

## 2024-03-15 ENCOUNTER — Ambulatory Visit: Admitting: Physician Assistant

## 2024-03-22 ENCOUNTER — Ambulatory Visit: Admitting: Physician Assistant

## 2024-03-22 ENCOUNTER — Encounter: Payer: Self-pay | Admitting: Physician Assistant

## 2024-03-22 VITALS — Ht 62.5 in | Wt 154.4 lb

## 2024-03-22 DIAGNOSIS — M810 Age-related osteoporosis without current pathological fracture: Secondary | ICD-10-CM

## 2024-03-22 NOTE — Progress Notes (Signed)
 Office Visit Note   Patient: Jessica Mack           Date of Birth: Feb 16, 1956           MRN: [id number] Visit Date: 03/22/2024              Requested by: Diedra Fowler, MD [address] [place],  [place] [address] PCP: Arcadio Knuckles, MD   Assessment & Plan: Visit Diagnoses:  1. Age-related osteoporosis without current pathological fracture     Plan: Patient is a pleasant 68 year old woman who comes in today for evaluation for osteoporosis referred by Dr. Sulema Endo.  She has a recent bone density scan of -2.6.  She is currently not taking any medication for osteoporosis she has not had any fragility fracture that she is aware of.  She has no cardiac history she does have a history of left breast carcinoma in 2005 she has been cancer free.  No kidney disease she had does have a history of a an ulcer.  She does have severe reflux no history of epilepsy or seizures.  She was through menopause at around 47 she did not take any hormone replacement.  She gets about 300 mg of calcium plus calcium within her diet.  She takes 200 international units of vitamin D .  She does relate a history of during her breast cancer taking steroids for 3 to 4 months.  She drinks socially.  She is engaging in walking and strength training and other aerobic activities.  She has had no trouble with her teeth.  No history that she knows of of spine fracture.  Based on all this and her history of reflux I have recommended Prolia I think this would be a great drug for her she has been given all the information about it we will go forward with preauthorization.  I also gave her some calcium supplements from seen to try.  She has had trouble with constipation with other calcium supplements.  Follow-Up Instructions: No follow-ups on file.   Orders:  Orders Placed This Encounter  Procedures   Vitamin D  (25 hydroxy)   No orders of the defined types were placed in this encounter.     Procedures: No procedures  performed   Clinical Data: No additional findings.   Subjective: No chief complaint on file.   HPI pleasant 68 year old woman comes in for evaluation of osteoporosis.  Does have a history of a low bone density at -2.6.  She is also had steroid treatment for 3 to 4 months.  She also cannot take oral medications like bisphosphonates because of an ulcer and severe reflux  Review of Systems  All other systems reviewed and are negative.    Objective: Vital Signs: Ht 5' 2.5" (1.588 m)   Wt 154 lb 6.4 oz (70 kg)   LMP 06/26/2004   BMI 27.79 kg/m   Physical Exam Constitutional:      Appearance: Normal appearance.  Pulmonary:     Effort: Pulmonary effort is normal.  Skin:    General: Skin is warm and dry.  Neurological:     General: No focal deficit present.     Mental Status: She is alert and oriented to person, place, and time.  Psychiatric:        Mood and Affect: Mood normal.        Behavior: Behavior normal.       Specialty Comments:  MRI LUMBAR SPINE WITHOUT CONTRAST   TECHNIQUE: Multiplanar, multisequence MR  imaging of the lumbar spine was performed. No intravenous contrast was administered.   COMPARISON:  None Available.   FINDINGS: Segmentation:  Standard.   Alignment:  Straightening of the normal lumbar lordosis.   Vertebrae: No fracture, evidence of discitis, or bone lesion. Degenerative endplate changes at the inferior and superior endplates of L4 on L5, as well as L5 on S1.   Conus medullaris and cauda equina: Conus extends to the L1-L2 disc space level. Conus and cauda equina appear normal.   Paraspinal and other soft tissues: There is nonspecific inward buckling of the anterior abdominal wall on the right (series 9, image 30).   Disc levels:   T12-L1: Unremarkable   L1-L2: Mild bilateral facet degenerative change. No significant disc bulge. No spinal canal narrowing. No neural foraminal narrowing.   L2-L3: No significant disc bulge.  Mild bilateral facet degenerative change. No spinal canal narrowing. Mild right neural foraminal narrowing.   L3-L4: Circumferential disc bulge. Mild bilateral facet degenerative change. Mild spinal canal narrowing. Mild right neural foraminal narrowing.   L4-L5: Circumferential disc bulge with a right paracentral disc protrusion that obliterates the right lateral recess. Mild overall spinal canal narrowing. Moderate right and mild left neural foraminal narrowing.   L5-S1: Eccentric left disc bulge with a right foraminal disc protrusion. Mild narrowing of the left lateral recess. Mild bilateral facet degenerative change. Severe right neural foraminal narrowing and mild left neural foraminal narrowing.   IMPRESSION: 1. Right paracentral disc protrusion at L4-L5 severely narrows the right lateral recess. 2. Severe right neural foraminal narrowing at L5-S1 secondary to a right foraminal disc protrusion. 3. Chronic nonspecific inward buckling of the anterior abdominal wall on the right. Recommend correlation with physical exam.     Electronically Signed   By: Clora Dane M.D.   On: 07/01/2023 18:06  Imaging: No results found.   PMFS History: Patient Active Problem List   Diagnosis Date Noted   Anemia 09/28/2023   Radiculopathy, lumbar region 08/24/2023   Elevated ferritin level 04/05/2023   NASH (nonalcoholic steatohepatitis) 06/04/2020   Hypertriglyceridemia    Routine general medical examination at a health care facility 02/20/2020   Essential hypertension 02/20/2020   Osteopenia 08/05/2014   Age-related osteoporosis without current pathological fracture 07/25/2013   Breast cancer of upper-outer quadrant of left female breast (HCC) 07/20/2013   Leukoplakia of oral mucosa, including tongue 07/26/2012   Essential and other specified forms of tremor 07/26/2012   Drug-induced polyneuropathy (HCC) 07/26/2012   Past Medical History:  Diagnosis Date   Avascular necrosis  of bone (HCC)    Left talus   Blood transfusion    Breast cancer (HCC) 2005   left   Cancer (HCC)    Dysplastic nevus 1988   right hip   Fatty liver    GERD (gastroesophageal reflux disease)    History of breast cancer 2005   invasive ductal cancer left breast   Hypertension    Hypertriglyceridemia    Irritable bowel syndrome    Metabolic syndrome    resolved with diet and exercise   NASH (nonalcoholic steatohepatitis)    Nocturnal leg cramps 02/14/2015   Osteopenia    Palpitations 09/2012   normal echo   Peripheral neuropathy    Personal history of chemotherapy    Personal history of radiation therapy    Plantar fasciitis 03/2003   PONV (postoperative nausea and vomiting)    Tongue dysplasia    Ulcer     Family History  Problem Relation  Age of Onset   Thyroid  disease Mother    Hyperlipidemia Mother    Hypertension Mother    Diabetes Mother    Cancer Mother        melanoma   Heart disease Mother    Depression Mother    Sleep apnea Mother    Obesity Mother    Heart disease Father    Hyperlipidemia Father    Hypertension Father    Cancer Father        colon   Prostate cancer Father    Kidney disease Father    Asthma Sister    Asthma Brother    Breast cancer Neg Hx     Past Surgical History:  Procedure Laterality Date   ANKLE SURGERY  07/2007   left, revascularization   BREAST LUMPECTOMY  06/2004   left, with port placement   CESAREAN SECTION  1991, 1993   x2   COLONOSCOPY  09/2013   Dr. Tova Fresh   DG GALL BLADDER     IRRIGATION AND DEBRIDEMENT ELBOW Left 05/04/2023   Procedure: LEFT ELBOW IRRIGATION AND DEBRIDEMENT;  Surgeon: Wilhelmenia Harada, MD;  Location: [facility];  Service: Orthopedics;  Laterality: Left;   LAPAROSCOPIC CHOLECYSTECTOMY  2003   LUMBAR LAMINECTOMY Right 08/24/2023   Procedure: LUMBAR FOUR-FIVE MICRODISCECTOMY, LUMBAR FIVE-SACRAL ONE RIGHT FORAMINOTOMY;  Surgeon: Diedra Fowler, MD;  Location: [facility];  Service: Orthopedics;   Laterality: Right;   MOUTH SURGERY  11/15   implant/crown placed 4/16   SHOULDER ARTHROSCOPY  10/2005   right   SHOULDER SURGERY  12/15   left   TONGUE SURGERY  2012, 2013   WRIST SURGERY  1988   right, [place]   Social History   Occupational History    Employer: [employer]   Occupation: MD/Adjunct Professor  Tobacco Use   Smoking status: Never   Smokeless tobacco: Never  Vaping Use   Vaping status: Never Used  Substance and Sexual Activity   Alcohol use: Yes    Alcohol/week: 7.0 standard drinks of alcohol    Types: 7 Standard drinks or equivalent per week    Comment: occas   Drug use: No   Sexual activity: Yes    Partners: Male    Birth control/protection: Post-menopausal    Comment: husband vasectomy

## 2024-03-23 LAB — VITAMIN D 25 HYDROXY (VIT D DEFICIENCY, FRACTURES): Vit D, 25-Hydroxy: 41 ng/mL (ref 30–100)

## 2024-03-28 ENCOUNTER — Ambulatory Visit (INDEPENDENT_AMBULATORY_CARE_PROVIDER_SITE_OTHER): Payer: PPO | Admitting: Internal Medicine

## 2024-03-28 ENCOUNTER — Encounter: Payer: Self-pay | Admitting: Internal Medicine

## 2024-03-28 ENCOUNTER — Other Ambulatory Visit: Payer: Self-pay | Admitting: Internal Medicine

## 2024-03-28 VITALS — BP 116/78 | HR 76 | Resp 16 | Ht 62.5 in | Wt 152.0 lb

## 2024-03-28 DIAGNOSIS — D1800 Hemangioma unspecified site: Secondary | ICD-10-CM | POA: Insufficient documentation

## 2024-03-28 DIAGNOSIS — L814 Other melanin hyperpigmentation: Secondary | ICD-10-CM | POA: Insufficient documentation

## 2024-03-28 DIAGNOSIS — I10 Essential (primary) hypertension: Secondary | ICD-10-CM

## 2024-03-28 DIAGNOSIS — D2371 Other benign neoplasm of skin of right lower limb, including hip: Secondary | ICD-10-CM | POA: Insufficient documentation

## 2024-03-28 DIAGNOSIS — E785 Hyperlipidemia, unspecified: Secondary | ICD-10-CM | POA: Insufficient documentation

## 2024-03-28 DIAGNOSIS — K760 Fatty (change of) liver, not elsewhere classified: Secondary | ICD-10-CM | POA: Insufficient documentation

## 2024-03-28 DIAGNOSIS — Z0000 Encounter for general adult medical examination without abnormal findings: Secondary | ICD-10-CM

## 2024-03-28 DIAGNOSIS — E781 Pure hyperglyceridemia: Secondary | ICD-10-CM | POA: Diagnosis not present

## 2024-03-28 DIAGNOSIS — D2262 Melanocytic nevi of left upper limb, including shoulder: Secondary | ICD-10-CM | POA: Insufficient documentation

## 2024-03-28 DIAGNOSIS — Z0001 Encounter for general adult medical examination with abnormal findings: Secondary | ICD-10-CM | POA: Insufficient documentation

## 2024-03-28 DIAGNOSIS — D2370 Other benign neoplasm of skin of unspecified lower limb, including hip: Secondary | ICD-10-CM | POA: Insufficient documentation

## 2024-03-28 MED ORDER — OMEGA-3-ACID ETHYL ESTERS 1 G PO CAPS
2.0000 | ORAL_CAPSULE | Freq: Two times a day (BID) | ORAL | 1 refills | Status: DC
Start: 2024-03-28 — End: 2024-09-04

## 2024-03-28 NOTE — Patient Instructions (Signed)

## 2024-03-28 NOTE — Progress Notes (Signed)
 Subjective:  Patient ID: Jessica Mack, female    DOB: 1956/01/27  Age: 68 y.o. MRN: [id number]  CC: Medical Management of Chronic Issues (6 month follow up.), Annual Exam, Hypertension, and Hyperlipidemia   HPI Jessica Mack presents for a CPX and f/up ----  Discussed the use of AI scribe software for clinical note transcription with the patient, who gave verbal consent to proceed.  History of Present Illness   Jessica Mack is a 68 year old female with sleep apnea and osteoporosis who presents for follow-up on her conditions.  She is active, engaging in activities such as pickleball, Pilates, walking, and weightlifting. She experiences shortness of breath during major exertion but has no chest pain. She is on a beta blocker and aims for a heart rate of 135 bpm during exercise, noting discomfort if it reaches 140 bpm.  She was diagnosed with sleep apnea after a sleep study showed 7.7 episodes per hour, slightly above normal. The study was affected by the positioning of the monitor due to discomfort from a calcification. She uses a wedge to sleep on her side, which she feels helps. She takes 0.5 mg of clonazepam  for sleep and finds her sleep restorative, occasionally napping for up to an hour in the afternoon.  She has osteoporosis and is awaiting approval for Prolia. Her vitamin D  level is 41, and she has started taking calcium chews, though she experiences constipation from calcium supplements.  She has a history of IBS with diarrhea, which is sometimes exacerbated by lactose-containing foods. She takes a probiotic with variable success and suspects lactose intolerance, though past testing was negative.  She had a colonoscopy in April 2025, which revealed a sessile serrated adenoma that was completely excised. Her father had colon cancer, and she has been undergoing colonoscopies every five years since age 26.  She continues to experience numbness and discomfort in the L3-4  distribution.       Outpatient Medications Prior to Visit  Medication Sig Dispense Refill   baclofen  (LIORESAL ) 10 MG tablet Take 1 tablet (10 mg total) by mouth 2 (two) times daily as needed for muscle spasms. 60 each 1   clonazePAM  (KLONOPIN ) 1 MG tablet Take 1 tablet (1 mg total) by mouth at bedtime. 90 tablet 1   Estradiol  10 MCG TABS vaginal tablet 1 tablet vaginally twice weekly (Patient taking differently: 1 tablet vaginally weekly) 24 tablet 3   famciclovir  (FAMVIR ) 500 MG tablet 3 tablets (1500mg ) at onset of fever blister symptoms 9 tablet 4   famotidine  (PEPCID ) 20 MG tablet Take 20 mg by mouth daily.     Multiple Vitamins-Minerals (MULTIVITAMIN PO) Take 1 tablet by mouth daily. 50+     nebivolol  (BYSTOLIC ) 5 MG tablet TAKE 1 TABLET BY MOUTH DAILY 90 tablet 1   olmesartan  (BENICAR ) 5 MG tablet TAKE 2 TABLETS BY MOUTH DAILY 180 tablet 1   Probiotic Product (ALIGN PO) Take 1 tablet by mouth daily.     omega-3 acid ethyl esters (LOVAZA ) 1 g capsule TAKE TWO CAPSULES BY MOUTH TWICE DAILY 360 capsule 1   acetaminophen  (TYLENOL ) 500 MG tablet Take 1,500 mg by mouth as needed.     gabapentin  (NEURONTIN ) 600 MG tablet TAKE 1 TABLET BY MOUTH 3 TIMES A DAY 90 tablet 0   tirzepatide  (MOUNJARO ) 5 MG/0.5ML Pen Inject 5 mg into the skin once a week.     No facility-administered medications prior to visit.    ROS Review of Systems  Constitutional: Negative.  Negative for appetite change, chills, diaphoresis, fatigue and fever.  HENT: Negative.    Eyes: Negative.   Respiratory: Negative.  Negative for cough, chest tightness, shortness of breath and wheezing.   Cardiovascular:  Negative for chest pain, palpitations and leg swelling.  Gastrointestinal: Negative.  Negative for abdominal pain, constipation, diarrhea, nausea and vomiting.  Genitourinary: Negative.  Negative for difficulty urinating and dysuria.  Musculoskeletal:  Positive for back pain. Negative for joint swelling and myalgias.   Neurological:  Negative for dizziness, weakness, light-headedness and headaches.  Hematological:  Negative for adenopathy. Does not bruise/bleed easily.  Psychiatric/Behavioral:  Positive for sleep disturbance. Negative for suicidal ideas. The patient is nervous/anxious.     Objective:  BP 116/78 (BP Location: Left Arm, Patient Position: Sitting, Cuff Size: Normal)   Pulse 76   Resp 16   Ht 5' 2.5" (1.588 m)   Wt 152 lb (68.9 kg)   LMP 06/26/2004   SpO2 99%   BMI 27.36 kg/m   BP Readings from Last 3 Encounters:  03/28/24 116/78  09/28/23 122/86  09/08/23 96/68    Wt Readings from Last 3 Encounters:  03/28/24 152 lb (68.9 kg)  03/22/24 154 lb 6.4 oz (70 kg)  09/28/23 147 lb 9.6 oz (67 kg)    Physical Exam Vitals reviewed.  Constitutional:      Appearance: Normal appearance.  HENT:     Mouth/Throat:     Mouth: Mucous membranes are moist.  Eyes:     General: No scleral icterus.    Conjunctiva/sclera: Conjunctivae normal.  Cardiovascular:     Rate and Rhythm: Normal rate and regular rhythm.     Heart sounds: No murmur heard.    No friction rub. No gallop.     Comments: EKG--- NSR, 63 bpm No LVH, Q waves, or ST/T wave changes  Unchanged  Pulmonary:     Effort: Pulmonary effort is normal.     Breath sounds: No stridor. No wheezing, rhonchi or rales.  Abdominal:     General: Abdomen is flat.     Palpations: There is no mass.     Tenderness: There is no abdominal tenderness. There is no guarding.     Hernia: No hernia is present.  Musculoskeletal:        General: Normal range of motion.     Cervical back: Neck supple.     Right lower leg: No edema.     Left lower leg: No edema.  Lymphadenopathy:     Cervical: No cervical adenopathy.  Skin:    General: Skin is warm and dry.  Neurological:     General: No focal deficit present.     Mental Status: She is alert. Mental status is at baseline.  Psychiatric:        Mood and Affect: Mood normal.        Behavior:  Behavior normal.     Lab Results  Component Value Date   WBC 3.8 (L) 12/27/2023   HGB 13.1 12/27/2023   HCT 38.6 12/27/2023   PLT 236.0 12/27/2023   GLUCOSE 97 12/27/2023   CHOL 233 (H) 12/27/2023   TRIG 173.0 (H) 12/27/2023   HDL 58.90 12/27/2023   LDLDIRECT 116.0 05/31/2020   LDLCALC 139 (H) 12/27/2023   ALT 22 12/27/2023   AST 21 12/27/2023   NA 140 12/27/2023   K 4.3 12/27/2023   CL 103 12/27/2023   CREATININE 0.77 12/27/2023   BUN 20 12/27/2023   CO2 27 12/27/2023  TSH 3.62 12/27/2023   INR 1.1 (H) 04/02/2023   HGBA1C 5.4 12/27/2023    DG BONE DENSITY (DXA) Result Date: 02/14/2024 EXAM: DUAL X-RAY ABSORPTIOMETRY (DXA) FOR BONE MINERAL DENSITY 02/14/2024 2:54 pm CLINICAL DATA:  68 year old Female Postmenopausal. Screening for osteoporosis Patient is or has been on glucocorticoid therapy. Patient is or has been on bone building therapies. TECHNIQUE: An axial (e.g., hips, spine) and/or appendicular (e.g., radius) exam was performed, as appropriate, using GE Secretary/administrator at [employer] [facility]. Images are obtained for bone mineral density measurement and are not obtained for diagnostic purposes. [id number] Exclusions: L3-L4 due to degenerative changes. COMPARISON:  09/27/2020. FINDINGS: Scan quality: Good. LUMBAR SPINE (L1-L2): BMD (in g/cm2): 0.857 T-score: -2.6 Z-score: -1.0 Rate of change from previous exam: No significant rate of change from previous exam. LEFT FEMORAL NECK: BMD (in g/cm2): 0.830 T-score: -1.5 Z-score: 0.0 LEFT TOTAL HIP: BMD (in g/cm2): 0.920 T-score: -0.7 Z-score: 0.5 RIGHT FEMORAL NECK: BMD (in g/cm2): 0.725 T-score: -2.3 Z-score: -0.8 RIGHT TOTAL HIP: BMD (in g/cm2): 0.838 T-score: -1.3 Z-score: -0.1 DUAL-FEMUR TOTAL MEAN: Rate of change from previous exam: -4.6 % FRAX 10-YEAR PROBABILITY OF FRACTURE: FRAX not reported as the lowest BMD is not in the osteopenia range. IMPRESSION: Osteoporosis based on BMD. Fracture risk is  unknown due to history of bone building therapy. RECOMMENDATIONS: 1. All patients should optimize calcium and vitamin D  intake. 2. Consider FDA-approved medical therapies in postmenopausal women and men aged 59 years and older, based on the following: - A hip or vertebral (clinical or morphometric) fracture - T-score less than or equal to -2.5 and secondary causes have been excluded. - Low bone mass (T-score between -1.0 and -2.5) and a 10-year probability of a hip fracture greater than or equal to 3% or a 10-year probability of a major osteoporosis-related fracture greater than or equal to 20% based on the [place] -adapted WHO algorithm. - Clinician judgment and/or patient preferences may indicate treatment for people with 10-year fracture probabilities above or below these levels 3. Patients with diagnosis of osteoporosis or at high risk for fracture should have regular bone mineral density tests. For patients eligible for Medicare, routine testing is allowed once every 2 years. The testing frequency can be increased to one year for patients who have rapidly progressing disease, those who are receiving or discontinuing medical therapy to restore bone mass, or have additional risk factors. Electronically Signed   By: Sundra Engel M.D.   On: 02/14/2024 15:33    Assessment & Plan:   Essential hypertension- BP is well controlled. EKG is negative for LVH. -     EKG 12-Lead  Hypertriglyceridemia -     Omega-3-acid  Ethyl Esters; Take 2 capsules (2 g total) by mouth 2 (two) times daily.  Dispense: 360 capsule; Refill: 1  Encounter for general adult medical examination with abnormal findings- Exam completed, labs reviewed, vaccines reviewed, cancer screenings are UTD, pt ed material was given.   Dyslipidemia, goal LDL below 130- Statin is not indicated.     Follow-up: No follow-ups on file.  Sandra Crouch, MD

## 2024-04-03 ENCOUNTER — Telehealth: Payer: Self-pay

## 2024-04-03 NOTE — Telephone Encounter (Signed)
 Appt is actually 04/17/24 @ 2pm

## 2024-04-03 NOTE — Telephone Encounter (Signed)
 Called patient and made her aware that her Prolia is approved and that her part to pay at time of visit is $400.00  she agreeded and will have christy order medication

## 2024-04-03 NOTE — Telephone Encounter (Signed)
 noted

## 2024-04-11 DIAGNOSIS — I10 Essential (primary) hypertension: Secondary | ICD-10-CM | POA: Diagnosis not present

## 2024-04-11 DIAGNOSIS — E782 Mixed hyperlipidemia: Secondary | ICD-10-CM | POA: Diagnosis not present

## 2024-04-11 DIAGNOSIS — Z6826 Body mass index (BMI) 26.0-26.9, adult: Secondary | ICD-10-CM | POA: Diagnosis not present

## 2024-04-11 DIAGNOSIS — R632 Polyphagia: Secondary | ICD-10-CM | POA: Diagnosis not present

## 2024-04-11 DIAGNOSIS — K7581 Nonalcoholic steatohepatitis (NASH): Secondary | ICD-10-CM | POA: Diagnosis not present

## 2024-04-11 DIAGNOSIS — I2510 Atherosclerotic heart disease of native coronary artery without angina pectoris: Secondary | ICD-10-CM | POA: Diagnosis not present

## 2024-04-11 DIAGNOSIS — G4733 Obstructive sleep apnea (adult) (pediatric): Secondary | ICD-10-CM | POA: Diagnosis not present

## 2024-04-11 DIAGNOSIS — E663 Overweight: Secondary | ICD-10-CM | POA: Diagnosis not present

## 2024-04-13 DIAGNOSIS — H2513 Age-related nuclear cataract, bilateral: Secondary | ICD-10-CM | POA: Diagnosis not present

## 2024-04-13 DIAGNOSIS — H01114 Allergic dermatitis of left upper eyelid: Secondary | ICD-10-CM | POA: Diagnosis not present

## 2024-04-13 DIAGNOSIS — H43813 Vitreous degeneration, bilateral: Secondary | ICD-10-CM | POA: Diagnosis not present

## 2024-04-13 DIAGNOSIS — H40013 Open angle with borderline findings, low risk, bilateral: Secondary | ICD-10-CM | POA: Diagnosis not present

## 2024-04-13 DIAGNOSIS — H01111 Allergic dermatitis of right upper eyelid: Secondary | ICD-10-CM | POA: Diagnosis not present

## 2024-04-17 ENCOUNTER — Ambulatory Visit: Admitting: Physician Assistant

## 2024-04-17 DIAGNOSIS — M545 Low back pain, unspecified: Secondary | ICD-10-CM | POA: Diagnosis not present

## 2024-04-17 DIAGNOSIS — M810 Age-related osteoporosis without current pathological fracture: Secondary | ICD-10-CM | POA: Diagnosis not present

## 2024-04-17 MED ORDER — DENOSUMAB 60 MG/ML ~~LOC~~ SOSY
60.0000 mg | PREFILLED_SYRINGE | SUBCUTANEOUS | Status: AC
  Administered 2024-10-24: 60 mg via SUBCUTANEOUS

## 2024-04-17 NOTE — Progress Notes (Signed)
 Patient came in today to receive her first Prolia injection.  Injection was administered in the left abdomen. Patient tolerated well and will schedule her appointment in 6 months

## 2024-04-18 DIAGNOSIS — M545 Low back pain, unspecified: Secondary | ICD-10-CM | POA: Diagnosis not present

## 2024-04-25 DIAGNOSIS — M545 Low back pain, unspecified: Secondary | ICD-10-CM | POA: Diagnosis not present

## 2024-05-01 DIAGNOSIS — M545 Low back pain, unspecified: Secondary | ICD-10-CM | POA: Diagnosis not present

## 2024-05-15 ENCOUNTER — Other Ambulatory Visit (INDEPENDENT_AMBULATORY_CARE_PROVIDER_SITE_OTHER): Payer: Self-pay

## 2024-05-15 ENCOUNTER — Ambulatory Visit: Admitting: Orthopedic Surgery

## 2024-05-15 DIAGNOSIS — Z9889 Other specified postprocedural states: Secondary | ICD-10-CM | POA: Diagnosis not present

## 2024-05-15 NOTE — Progress Notes (Signed)
 Orthopedic Surgery Post-operative Office Visit   Procedure: right L4/5 hemilaminotomy and microdiscectomy, right L5/S1 foraminotomy Date of Surgery: 08/24/2023 (~9 months post-op)   Assessment: Patient is a 68 y.o. who has had improvement in her radiating pain since surgery. Still has numbness over the lateral thigh and leg but not pain. Reporting right posterior hip pain - has some exam findings and area is suggestive of SI joint     Plan: -No spine specific precautions, activity as tolerated -OTC medications as needed for pain relief -Recommended a diagnostic/therapeutic SI joint injection with Dr. Burnetta -If she does not get good relief with the injection, could consider L5/S1 facets or the hip joint itself as other potential etiologies -Return to office in 3 months, x-rays at next visit: AP/lateral/flex/ex   ___________________________________________________________________________     Subjective: Patient still notes improvement in the radiating leg pain.  She is no longer taking all the medication she was preoperatively.  She is taking Aleve occasionally for when she is more active.  She has been working with a physical therapist, exercising, and playing pickle ball.  She does note pain over the posterior hip in the area of the SI joint.  She does not have any left-sided lower extremity pain.  She is having stiffness in her low back and sometimes it takes a while for her to transition to a different position.  For example, getting out of the car takes a little while for her to make that transition get going.  She also continues to have numbness over the lateral aspect of her thigh and leg on the right side.  Has a chronic history of neuropathy with distal lower extremity numbness and paresthesias which this is different from.  No proximal leg numbness or paresthesias on the left side.  Of note, has seen Ronal Dragon and is now on Prolia  for her osteoporosis.   Objective:   General: no  acute distress Neurologic: alert, answering questions appropriately, following commands Respiratory: unlabored breathing on room air Skin: incision is well healed   MSK (spine):   -Strength exam                                                   Left                  Right   EHL                              5/5                  4/5 TA                                 5/5                  5/5 GSC                             5/5                  5/5 Knee extension            5/5  5/5 Hip flexion                    5/5                  5/5   -RLE: Negative Stinchfield, no pain through range of motion at the hip, negative FADIR, negative FABER, positive SI joint compression test, negative Gaenslen's, TTP over the SI joint   Imaging: XRs of the lumbar spine from 05/15/2024 were independently reviewed and interpreted, showing disc height loss at L4/5 and L5/S1. Small anterior osteophyte formation at L4/5. No fracture or dislocation seen. No evidence of instability on flexion/extension views.      Patient name: Jessica Mack Patient MRN: [id number] Date of visit: 05/15/24

## 2024-05-18 ENCOUNTER — Ambulatory Visit: Admitting: Orthopedic Surgery

## 2024-05-19 ENCOUNTER — Other Ambulatory Visit: Payer: Self-pay | Admitting: Internal Medicine

## 2024-05-19 DIAGNOSIS — I10 Essential (primary) hypertension: Secondary | ICD-10-CM

## 2024-05-20 DIAGNOSIS — M545 Low back pain, unspecified: Secondary | ICD-10-CM | POA: Diagnosis not present

## 2024-05-22 DIAGNOSIS — H43813 Vitreous degeneration, bilateral: Secondary | ICD-10-CM | POA: Diagnosis not present

## 2024-05-22 DIAGNOSIS — H40013 Open angle with borderline findings, low risk, bilateral: Secondary | ICD-10-CM | POA: Diagnosis not present

## 2024-05-22 DIAGNOSIS — H2513 Age-related nuclear cataract, bilateral: Secondary | ICD-10-CM | POA: Diagnosis not present

## 2024-05-23 ENCOUNTER — Encounter: Payer: Self-pay | Admitting: Orthopedic Surgery

## 2024-05-24 DIAGNOSIS — M545 Low back pain, unspecified: Secondary | ICD-10-CM | POA: Diagnosis not present

## 2024-05-28 ENCOUNTER — Telehealth: Admitting: Physician Assistant

## 2024-05-28 DIAGNOSIS — U071 COVID-19: Secondary | ICD-10-CM

## 2024-05-28 MED ORDER — NIRMATRELVIR/RITONAVIR (PAXLOVID)TABLET: 3.0000 | ORAL_TABLET | Freq: Two times a day (BID) | ORAL | 0 refills | Status: AC

## 2024-05-28 NOTE — Progress Notes (Signed)
 Virtual Visit Consent   Jessica Mack, you are scheduled for a virtual visit with a [facility] provider today. Just as with appointments in the office, your consent must be obtained to participate. Your consent will be active for this visit and any virtual visit you may have with one of our providers in the next 365 days. If you have a MyChart account, a copy of this consent can be sent to you electronically.  As this is a virtual visit, video technology does not allow for your provider to perform a traditional examination. This may limit your provider's ability to fully assess your condition. If your provider identifies any concerns that need to be evaluated in person or the need to arrange testing (such as labs, EKG, etc.), we will make arrangements to do so. Although advances in technology are sophisticated, we cannot ensure that it will always work on either your end or our end. If the connection with a video visit is poor, the visit may have to be switched to a telephone visit. With either a video or telephone visit, we are not always able to ensure that we have a secure connection.  By engaging in this virtual visit, you consent to the provision of healthcare and authorize for your insurance to be billed (if applicable) for the services provided during this visit. Depending on your insurance coverage, you may receive a charge related to this service.  I need to obtain your verbal consent now. Are you willing to proceed with your visit today? Jessica Mack has provided verbal consent on 05/28/2024 for a virtual visit (video or telephone). Harlene PEDLAR Ward, PA-C  Date: 05/28/2024 12:37 PM   Virtual Visit via Video Note   I, Harlene PEDLAR Ward, connected with  Jessica Mack  ([id number], 11-Jun-1956) on 05/28/24 at 12:30 PM EDT by a video-enabled telemedicine application and verified that I am speaking with the correct person using two identifiers.  Location: Patient: Virtual Visit Location  Patient: Home Provider: Virtual Visit Location Provider: Home Office   I discussed the limitations of evaluation and management by telemedicine and the availability of in person appointments. The patient expressed understanding and agreed to proceed.    History of Present Illness: Jessica Mack is a 68 y.o. who identifies as a female who was assigned female at birth, and is being seen today for sore throat, cough, congestion, mild headache that started 3 days ago.  Tested positive for COVID on home test today.  Has had COVID 1 other time in June of 2022, took Paxlovid  at that time.  Denies wheezing or shortness of breath, denies n/v.    HPI: HPI  Problems:  Patient Active Problem List   Diagnosis Date Noted   Encounter for general adult medical examination with abnormal findings 03/28/2024   Dyslipidemia, goal LDL below 130 03/28/2024   Radiculopathy, lumbar region 08/24/2023   Elevated ferritin level 04/05/2023   NASH (nonalcoholic steatohepatitis) 06/04/2020   Hypertriglyceridemia    Essential hypertension 02/20/2020   Osteopenia 08/05/2014   Age-related osteoporosis without current pathological fracture 07/25/2013   Breast cancer of upper-outer quadrant of left female breast (HCC) 07/20/2013   Leukoplakia of oral mucosa, including tongue 07/26/2012   Essential and other specified forms of tremor 07/26/2012   Drug-induced polyneuropathy (HCC) 07/26/2012    Allergies:  Allergies  Allergen Reactions   Augmentin [Amoxicillin-Pot Clavulanate]     abd cramping   Cephalexin      Profuse diarrhea   Crestor [  Rosuvastatin]     Liver function elevated    Medications:  Current Outpatient Medications:    nirmatrelvir /ritonavir  (PAXLOVID ) 20 x 150 MG & 10 x 100MG  TABS, Take 3 tablets by mouth 2 (two) times daily for 5 days. (Take nirmatrelvir  150 mg two tablets twice daily for 5 days and ritonavir  100 mg one tablet twice daily for 5 days) Patient GFR is 80.93, Disp: 30 tablet, Rfl: 0    baclofen  (LIORESAL ) 10 MG tablet, Take 1 tablet (10 mg total) by mouth 2 (two) times daily as needed for muscle spasms., Disp: 60 each, Rfl: 1   clonazePAM  (KLONOPIN ) 1 MG tablet, Take 1 tablet (1 mg total) by mouth at bedtime., Disp: 90 tablet, Rfl: 1   Estradiol  10 MCG TABS vaginal tablet, 1 tablet vaginally twice weekly (Patient taking differently: 1 tablet vaginally weekly), Disp: 24 tablet, Rfl: 3   famciclovir  (FAMVIR ) 500 MG tablet, 3 tablets (1500mg ) at onset of fever blister symptoms, Disp: 9 tablet, Rfl: 4   famotidine  (PEPCID ) 20 MG tablet, Take 20 mg by mouth daily., Disp: , Rfl:    Multiple Vitamins-Minerals (MULTIVITAMIN PO), Take 1 tablet by mouth daily. 50+, Disp: , Rfl:    nebivolol  (BYSTOLIC ) 5 MG tablet, TAKE 1 TABLET BY MOUTH DAILY, Disp: 90 tablet, Rfl: 1   olmesartan  (BENICAR ) 5 MG tablet, TAKE 2 TABLETS BY MOUTH DAILY, Disp: 180 tablet, Rfl: 1   omega-3 acid ethyl esters (LOVAZA ) 1 g capsule, Take 2 capsules (2 g total) by mouth 2 (two) times daily., Disp: 360 capsule, Rfl: 1   Probiotic Product (ALIGN PO), Take 1 tablet by mouth daily., Disp: , Rfl:   Current Facility-Administered Medications:    [START ON 10/14/2024] denosumab  (PROLIA ) injection 60 mg, 60 mg, Subcutaneous, Q6 months, Persons, Ronal Dragon, PA  Observations/Objective: Patient is well-developed, well-nourished in no acute distress.  Resting comfortably  at home.  Head is normocephalic, atraumatic.  No labored breathing.  Speech is clear and coherent with logical content.  Patient is alert and oriented at baseline.    Assessment and Plan: 1. COVID (Primary)  Sx mild at this time, will initiate Paxlovid .  Supportive care discussed. In person evaluation precautions discussed.   Follow Up Instructions: I discussed the assessment and treatment plan with the patient. The patient was provided an opportunity to ask questions and all were answered. The patient agreed with the plan and demonstrated an  understanding of the instructions.  A copy of instructions were sent to the patient via MyChart unless otherwise noted below.     The patient was advised to call back or seek an in-person evaluation if the symptoms worsen or if the condition fails to improve as anticipated.    Harlene PEDLAR Ward, PA-C

## 2024-05-28 NOTE — Patient Instructions (Signed)
 Macario Leeroy Sharps, thank you for joining Harlene PEDLAR Ward, PA-C for today's virtual visit.  While this provider is not your primary care provider (PCP), if your PCP is located in our provider database this encounter information will be shared with them immediately following your visit.   A [facility] MyChart account gives you access to today's visit and all your visits, tests, and labs performed at [facility]  click here if you don't have a [facility] MyChart account or go to [email or web address]  Consent: (Patient) Jessica Mack provided verbal consent for this virtual visit at the beginning of the encounter.  Current Medications:  Current Outpatient Medications:    nirmatrelvir /ritonavir  (PAXLOVID ) 20 x 150 MG & 10 x 100MG  TABS, Take 3 tablets by mouth 2 (two) times daily for 5 days. (Take nirmatrelvir  150 mg two tablets twice daily for 5 days and ritonavir  100 mg one tablet twice daily for 5 days) Patient GFR is 80.93, Disp: 30 tablet, Rfl: 0   baclofen  (LIORESAL ) 10 MG tablet, Take 1 tablet (10 mg total) by mouth 2 (two) times daily as needed for muscle spasms., Disp: 60 each, Rfl: 1   clonazePAM  (KLONOPIN ) 1 MG tablet, Take 1 tablet (1 mg total) by mouth at bedtime., Disp: 90 tablet, Rfl: 1   Estradiol  10 MCG TABS vaginal tablet, 1 tablet vaginally twice weekly (Patient taking differently: 1 tablet vaginally weekly), Disp: 24 tablet, Rfl: 3   famciclovir  (FAMVIR ) 500 MG tablet, 3 tablets (1500mg ) at onset of fever blister symptoms, Disp: 9 tablet, Rfl: 4   famotidine  (PEPCID ) 20 MG tablet, Take 20 mg by mouth daily., Disp: , Rfl:    Multiple Vitamins-Minerals (MULTIVITAMIN PO), Take 1 tablet by mouth daily. 50+, Disp: , Rfl:    nebivolol  (BYSTOLIC ) 5 MG tablet, TAKE 1 TABLET BY MOUTH DAILY, Disp: 90 tablet, Rfl: 1   olmesartan  (BENICAR ) 5 MG tablet, TAKE 2 TABLETS BY MOUTH DAILY, Disp: 180 tablet, Rfl: 1   omega-3 acid ethyl esters (LOVAZA ) 1 g capsule, Take 2 capsules  (2 g total) by mouth 2 (two) times daily., Disp: 360 capsule, Rfl: 1   Probiotic Product (ALIGN PO), Take 1 tablet by mouth daily., Disp: , Rfl:   Current Facility-Administered Medications:    [START ON 10/14/2024] denosumab  (PROLIA ) injection 60 mg, 60 mg, Subcutaneous, Q6 months, Persons, Ronal Dragon, PA   Medications ordered in this encounter:  Meds ordered this encounter  Medications   nirmatrelvir /ritonavir  (PAXLOVID ) 20 x 150 MG & 10 x 100MG  TABS    Sig: Take 3 tablets by mouth 2 (two) times daily for 5 days. (Take nirmatrelvir  150 mg two tablets twice daily for 5 days and ritonavir  100 mg one tablet twice daily for 5 days) Patient GFR is 80.93    Dispense:  30 tablet    Refill:  0    Supervising Provider:   BLAISE ALEENE KIDD [phone]     *If you need refills on other medications prior to your next appointment, please contact your pharmacy*  Follow-Up: Call back or seek an in-person evaluation if the symptoms worsen or if the condition fails to improve as anticipated.  [facility] [phone]  Other Instructions Take Paxlovid  as prescribed, may experience a metallic taste with this medication.  Recommend Mucinex for congestion.  Can take Tessalon  as needed for cough Drink plenty of fluids and rest.  Ok to be around others as long as fever free for 24 hours.  If you develop new  or worsening symptoms please be seen for in person evaluation.    If you have been instructed to have an in-person evaluation today at a local Urgent Care facility, please use the link below. It will take you to a list of all of our available [facility], including address, phone number and hours of operation. Please do not delay care.  [facility]  If you or a family member do not have a primary care provider, use the link below to schedule a visit and establish care. When you choose a [facility] primary care physician or advanced practice provider, you gain a  long-term partner in health. Find a Primary Care Provider  Learn more about [facility]'s in-office and virtual care options: [facility] - Get Care Now

## 2024-05-31 ENCOUNTER — Ambulatory Visit: Admitting: Orthopedic Surgery

## 2024-06-05 DIAGNOSIS — M545 Low back pain, unspecified: Secondary | ICD-10-CM | POA: Diagnosis not present

## 2024-06-07 ENCOUNTER — Encounter: Payer: Self-pay | Admitting: Internal Medicine

## 2024-06-08 ENCOUNTER — Ambulatory Visit: Admitting: Sports Medicine

## 2024-06-12 ENCOUNTER — Ambulatory Visit: Admitting: Orthopedic Surgery

## 2024-06-19 DIAGNOSIS — M545 Low back pain, unspecified: Secondary | ICD-10-CM | POA: Diagnosis not present

## 2024-06-23 ENCOUNTER — Other Ambulatory Visit: Payer: Self-pay

## 2024-06-23 ENCOUNTER — Ambulatory Visit: Admitting: Sports Medicine

## 2024-06-23 ENCOUNTER — Encounter: Payer: Self-pay | Admitting: Sports Medicine

## 2024-06-23 DIAGNOSIS — G8929 Other chronic pain: Secondary | ICD-10-CM | POA: Diagnosis not present

## 2024-06-23 DIAGNOSIS — M533 Sacrococcygeal disorders, not elsewhere classified: Secondary | ICD-10-CM

## 2024-06-23 DIAGNOSIS — M7918 Myalgia, other site: Secondary | ICD-10-CM

## 2024-06-23 NOTE — Progress Notes (Signed)
   Procedure Note  Patient: Jessica Mack             Date of Birth: 1956-01-27           MRN: [id number]             Visit Date: 06/23/2024  Procedures: Visit Diagnoses:  1. Chronic right SI joint pain   2. Right buttock pain    U/S-guided SI-joint injection, Right   After discussion of risk/benefits/indications, informed verbal consent was obtained. A timeout was then performed. The patient was positioned in a prone position on exam room table with a pillow placed under the pelvis for mild hip flexion. The SI joint area was cleaned and prepped with betadine  and alcohol swabs . Sterile ultrasound gel was applied and the ultrasound transducer was placed in an anatomic axial plane over the PSIS, then moved distally over the SI-joint. Using ultrasound guidance, a 22-gauge, 3.5 needle was inserted from a medial to lateral approach utilizing an in-plane approach and directed into the SI-joint. The SI-joint was then injected with a mixture of 4:2 lidocaine :depomedrol with visualization of the injectate flow into the SI-joint under ultrasound visualization. The patient tolerated the procedure well without immediate complications.  - patient tolerated procedure well, discussed post-injection protocol - follow-up with Dr. Georgina as indicated; I am happy to see them as needed  Lonell Sprang, DO Primary Care Sports Medicine Physician  [facility] - [facility]  This note was dictated using Dragon naturally speaking software and may contain errors in syntax, spelling, or content which have not been identified prior to signing this note.

## 2024-06-24 DIAGNOSIS — M545 Low back pain, unspecified: Secondary | ICD-10-CM | POA: Diagnosis not present

## 2024-06-29 ENCOUNTER — Other Ambulatory Visit: Payer: Self-pay | Admitting: Internal Medicine

## 2024-06-29 DIAGNOSIS — G620 Drug-induced polyneuropathy: Secondary | ICD-10-CM

## 2024-06-29 DIAGNOSIS — G4762 Sleep related leg cramps: Secondary | ICD-10-CM

## 2024-07-03 DIAGNOSIS — M545 Low back pain, unspecified: Secondary | ICD-10-CM | POA: Diagnosis not present

## 2024-07-12 DIAGNOSIS — M545 Low back pain, unspecified: Secondary | ICD-10-CM | POA: Diagnosis not present

## 2024-07-12 DIAGNOSIS — L821 Other seborrheic keratosis: Secondary | ICD-10-CM | POA: Diagnosis not present

## 2024-07-12 DIAGNOSIS — D2272 Melanocytic nevi of left lower limb, including hip: Secondary | ICD-10-CM | POA: Diagnosis not present

## 2024-07-12 DIAGNOSIS — D485 Neoplasm of uncertain behavior of skin: Secondary | ICD-10-CM | POA: Diagnosis not present

## 2024-07-17 DIAGNOSIS — M545 Low back pain, unspecified: Secondary | ICD-10-CM | POA: Diagnosis not present

## 2024-07-20 ENCOUNTER — Encounter: Payer: Self-pay | Admitting: Internal Medicine

## 2024-07-20 ENCOUNTER — Other Ambulatory Visit: Payer: Self-pay | Admitting: Orthopedic Surgery

## 2024-07-24 DIAGNOSIS — M545 Low back pain, unspecified: Secondary | ICD-10-CM | POA: Diagnosis not present

## 2024-07-28 ENCOUNTER — Ambulatory Visit (HOSPITAL_BASED_OUTPATIENT_CLINIC_OR_DEPARTMENT_OTHER): Payer: PPO | Admitting: Obstetrics & Gynecology

## 2024-07-28 ENCOUNTER — Encounter (HOSPITAL_BASED_OUTPATIENT_CLINIC_OR_DEPARTMENT_OTHER): Payer: Self-pay | Admitting: Obstetrics & Gynecology

## 2024-07-28 VITALS — BP 123/86 | HR 77 | Ht 62.0 in | Wt 154.2 lb

## 2024-07-28 DIAGNOSIS — C50412 Malignant neoplasm of upper-outer quadrant of left female breast: Secondary | ICD-10-CM

## 2024-07-28 DIAGNOSIS — N952 Postmenopausal atrophic vaginitis: Secondary | ICD-10-CM | POA: Diagnosis not present

## 2024-07-28 DIAGNOSIS — M810 Age-related osteoporosis without current pathological fracture: Secondary | ICD-10-CM

## 2024-07-28 DIAGNOSIS — Z9189 Other specified personal risk factors, not elsewhere classified: Secondary | ICD-10-CM

## 2024-07-28 DIAGNOSIS — B001 Herpesviral vesicular dermatitis: Secondary | ICD-10-CM | POA: Diagnosis not present

## 2024-07-28 NOTE — Progress Notes (Signed)
 Breast and Pelvic Exam Patient name: Jessica Mack MRN [id number]  Date of birth: 1956/08/20 Chief Complaint:   Gynecologic Exam (Pt reports no concerns or complaints today. )  History of Present Illness:   Jessica Mack is a 68 y.o. G2P2 Caucasian female being seen today for breast and pelvic exam.  Denies vaginal bleeding.    H/o osteoporosis.  Started prolia  and is being followed by the osteoporosis clinic.    Patient's last menstrual period was 06/26/2004.  Last pap 07/19/2023. Results were: NILM w/ HRHPV not done. H/O abnormal pap: no Last mammogram: 10/04/2023. Results were: normal.  Personal hx of breast cancer.  t Last colonoscopy: 02/04/2024. Results were: abnormal 1 polyp. Family h/o colorectal cancer: yes father.  Pathology showed sessile polyp.  Follow up 5 years.       07/19/2023    2:15 PM 07/06/2022    2:58 PM 07/15/2021    7:28 AM 06/04/2020   10:17 AM  Depression screen PHQ 2/9  Decreased Interest 0 0 1 0  Down, Depressed, Hopeless 0 0 2 0  PHQ - 2 Score 0 0 3 0  Altered sleeping   2   Tired, decreased energy   1   Change in appetite   0   Feeling bad or failure about yourself    1   Trouble concentrating   0   Moving slowly or fidgety/restless   0   Suicidal thoughts   0   PHQ-9 Score   7   Difficult doing work/chores   Not difficult at all     Review of Systems:   Pertinent items are noted in HPI Denies any pelvic pain, urinary or bowel changes.   Pertinent History Reviewed:  Reviewed past medical,surgical, social and family history.  Reviewed problem list, medications and allergies. Physical Assessment:   Vitals:   07/28/24 0815  BP: 123/86  Pulse: 77  SpO2: 100%  Weight: 154 lb 3.2 oz (69.9 kg)  Height: 5' 2 (1.575 m)  Body mass index is 28.2 kg/m.        Physical Examination:   General appearance - well appearing, and in no distress  Mental status - alert, oriented to person, place, and time  Psych:  She has a normal mood and  affect  Skin - warm and dry, normal color, no suspicious lesions noted  Chest - effort normal, all lung fields clear to auscultation bilaterally  Heart - normal rate and regular rhythm  Neck:  midline trachea, no thyromegaly or nodules  Breasts - breasts appear normal, stable nodule left breast just above midline incision, no new suspicious masses, no skin or nipple changes or axillary nodes  Abdomen - soft, nontender, nondistended, no masses or organomegaly  Pelvic - VULVA: very small area of thickened whitish tissue just to the left of the introitus-- stable findings, tenderness or lesions   VAGINA: normal appearing vagina with normal color and discharge, no lesions   CERVIX: normal appearing cervix without discharge or lesions, no CMT  Thin prep pap is not indicated  UTERUS: uterus is felt to be normal size, shape, consistency and nontender   ADNEXA: No adnexal masses or tenderness noted.  Rectal - normal rectal, good sphincter tone, no masses felt.   Extremities:  No swelling or varicosities noted  Chaperone present for exam  No results found for this or any previous visit (from the past 24 hours).  Assessment & Plan:  1. GYN exam for high-risk Medicare  patient (Primary) - Pap smear neg 2024 - Mammogram 09/2023 - Colonoscopy 2025.  Follow up 5 years.  - Bone mineral density 2024 - lab work done with PCP, Dr. Debby - vaccines reviewed/updated  2. Malignant neoplasm of upper-outer quadrant of left female breast, unspecified estrogen receptor status (HCC)  3. Age-related osteoporosis without current pathological fracture - on prolia .  Followed at the osteoporosis clinic.  Plan repeat DEXA around 03/2026 after 24 months of prolia  and prior to 5th injection.    4. H/o vaginal atrophy - doesn't need RF for vagifem  that she uses once weekly  5. Fever blister - does not need RF for antiviral but will call when she does   No orders of the defined types were placed in this  encounter.   Meds: No orders of the defined types were placed in this encounter.   Follow-up: Return in about 1 year (around 07/28/2025).  Ronal GORMAN Pinal, MD 07/28/2024 9:36 AM

## 2024-07-31 DIAGNOSIS — M545 Low back pain, unspecified: Secondary | ICD-10-CM | POA: Diagnosis not present

## 2024-08-10 DIAGNOSIS — L905 Scar conditions and fibrosis of skin: Secondary | ICD-10-CM | POA: Diagnosis not present

## 2024-08-10 DIAGNOSIS — D2272 Melanocytic nevi of left lower limb, including hip: Secondary | ICD-10-CM | POA: Diagnosis not present

## 2024-08-10 DIAGNOSIS — D485 Neoplasm of uncertain behavior of skin: Secondary | ICD-10-CM | POA: Diagnosis not present

## 2024-08-14 ENCOUNTER — Ambulatory Visit: Admitting: Orthopedic Surgery

## 2024-08-14 ENCOUNTER — Other Ambulatory Visit: Payer: Self-pay

## 2024-08-14 DIAGNOSIS — M545 Low back pain, unspecified: Secondary | ICD-10-CM | POA: Diagnosis not present

## 2024-08-14 DIAGNOSIS — Z9889 Other specified postprocedural states: Secondary | ICD-10-CM | POA: Diagnosis not present

## 2024-08-14 NOTE — Progress Notes (Signed)
 Orthopedic Surgery Post-operative Office Visit   Procedure: right L4/5 hemilaminotomy and microdiscectomy, right L5/S1 foraminotomy Date of Surgery: 08/24/2023 (~1 year post-op)  Patient is now 1 year out from L4/5 hemilaminotomy and microdiscectomy.  She has not had any return of her radiating leg pain.  She has gone back to pickleball.  She is working with a Psychologist, educational and doing physical therapy weekly.  She still has some stiffness in her back.  She is also had bilateral SI joint pain.  After her last visit, she got an injection with Dr. Burnetta.  She got 100% relief with that injection but it only lasted for about 2 weeks.  She has noticed now with development of left-sided SI joint pain.  I covered the treatment options for her SI joint.  With her stiffness, I attribute that to the significant degenerative disc that she has at L4/5 and L5/S1.  I encouraged her to continue to stretch and heat the area and do dynamic warm ups before she is physically active.  I explained that there are not great options for stiffness in the low back from a surgical standpoint.  She has also been having bilateral leg cramping at night.  She has been using Flexeril  for this.  She finds the Flexeril  helpful.  I told her that I would continue to prescribe the Flexeril  for her as needed.  She is going to return to the office on an as needed basis.   Imaging: XRs of the lumbar spine from 08/14/2024 were independently reviewed and interpret, showing disc height loss at L4/5 and L5/S1.  Vacuum disc phenomenon seen at L5/S1.  No fracture or dislocation seen.  No evidence of instability on flexion/extension views.     Patient name: Jessica Mack Patient MRN: [id number] Date of visit: 08/14/24

## 2024-08-21 DIAGNOSIS — M545 Low back pain, unspecified: Secondary | ICD-10-CM | POA: Diagnosis not present

## 2024-08-28 ENCOUNTER — Other Ambulatory Visit (HOSPITAL_BASED_OUTPATIENT_CLINIC_OR_DEPARTMENT_OTHER): Payer: Self-pay | Admitting: Obstetrics & Gynecology

## 2024-08-28 ENCOUNTER — Encounter: Payer: Self-pay | Admitting: Orthopedic Surgery

## 2024-08-28 ENCOUNTER — Encounter (HOSPITAL_BASED_OUTPATIENT_CLINIC_OR_DEPARTMENT_OTHER): Payer: Self-pay | Admitting: Obstetrics & Gynecology

## 2024-08-28 ENCOUNTER — Encounter: Payer: Self-pay | Admitting: Radiology

## 2024-08-28 ENCOUNTER — Encounter: Payer: Self-pay | Admitting: Obstetrics & Gynecology

## 2024-08-28 MED ORDER — CYCLOBENZAPRINE HCL 10 MG PO TABS: 10.0000 mg | ORAL_TABLET | Freq: Three times a day (TID) | ORAL | 2 refills | Status: AC | PRN

## 2024-08-29 DIAGNOSIS — M545 Low back pain, unspecified: Secondary | ICD-10-CM | POA: Diagnosis not present

## 2024-08-30 ENCOUNTER — Other Ambulatory Visit: Payer: Self-pay | Admitting: Internal Medicine

## 2024-08-30 DIAGNOSIS — E781 Pure hyperglyceridemia: Secondary | ICD-10-CM

## 2024-09-03 ENCOUNTER — Other Ambulatory Visit: Payer: Self-pay | Admitting: Internal Medicine

## 2024-09-03 DIAGNOSIS — I10 Essential (primary) hypertension: Secondary | ICD-10-CM

## 2024-09-04 DIAGNOSIS — M545 Low back pain, unspecified: Secondary | ICD-10-CM | POA: Diagnosis not present

## 2024-09-11 DIAGNOSIS — M545 Low back pain, unspecified: Secondary | ICD-10-CM | POA: Diagnosis not present

## 2024-09-19 DIAGNOSIS — M545 Low back pain, unspecified: Secondary | ICD-10-CM | POA: Diagnosis not present

## 2024-09-25 ENCOUNTER — Other Ambulatory Visit (HOSPITAL_BASED_OUTPATIENT_CLINIC_OR_DEPARTMENT_OTHER): Payer: Self-pay

## 2024-09-26 DIAGNOSIS — M545 Low back pain, unspecified: Secondary | ICD-10-CM | POA: Diagnosis not present

## 2024-09-27 ENCOUNTER — Other Ambulatory Visit (HOSPITAL_BASED_OUTPATIENT_CLINIC_OR_DEPARTMENT_OTHER): Payer: Self-pay

## 2024-09-27 ENCOUNTER — Other Ambulatory Visit (HOSPITAL_BASED_OUTPATIENT_CLINIC_OR_DEPARTMENT_OTHER): Payer: Self-pay | Admitting: Obstetrics & Gynecology

## 2024-09-27 DIAGNOSIS — Z1231 Encounter for screening mammogram for malignant neoplasm of breast: Secondary | ICD-10-CM

## 2024-09-27 DIAGNOSIS — Z853 Personal history of malignant neoplasm of breast: Secondary | ICD-10-CM

## 2024-09-28 ENCOUNTER — Ambulatory Visit: Admitting: Internal Medicine

## 2024-09-29 ENCOUNTER — Ambulatory Visit: Admitting: Family Medicine

## 2024-09-29 ENCOUNTER — Encounter: Payer: Self-pay | Admitting: Family Medicine

## 2024-09-29 VITALS — BP 116/72 | HR 83 | Ht 62.5 in | Wt 159.6 lb

## 2024-09-29 DIAGNOSIS — Z780 Asymptomatic menopausal state: Secondary | ICD-10-CM | POA: Insufficient documentation

## 2024-09-29 DIAGNOSIS — K7581 Nonalcoholic steatohepatitis (NASH): Secondary | ICD-10-CM

## 2024-09-29 DIAGNOSIS — N958 Other specified menopausal and perimenopausal disorders: Secondary | ICD-10-CM | POA: Insufficient documentation

## 2024-09-29 DIAGNOSIS — Z6828 Body mass index (BMI) 28.0-28.9, adult: Secondary | ICD-10-CM

## 2024-09-29 DIAGNOSIS — E785 Hyperlipidemia, unspecified: Secondary | ICD-10-CM

## 2024-09-29 DIAGNOSIS — M810 Age-related osteoporosis without current pathological fracture: Secondary | ICD-10-CM

## 2024-09-29 DIAGNOSIS — Z9889 Other specified postprocedural states: Secondary | ICD-10-CM | POA: Insufficient documentation

## 2024-09-29 NOTE — Assessment & Plan Note (Signed)
 Assessment: DXA 02/14/24 confirms osteoporosis (T-score -2.6 lumbar, -2.3 R femoral neck) with ? dual-femur BMD vs 2021. Postmenopausal, prior glucocorticoids, currently on Prolia .  Plan:  Continue Prolia  60 mg SQ q6mo; avoid delays to prevent rebound bone loss.  Maintain calcium 1200 mg/day and vitamin D  >=30 ng/mL.  Check serum calcium and Vit D with dosing; monitor CMP/phos periodically.  Encourage weight-bearing/resistance exercise; fall-risk precautions; avoid smoking, limit alcohol.  Repeat DXA in 24 months to assess response.

## 2024-10-01 NOTE — Progress Notes (Unsigned)
 Assessment and Plan  1. Metabolic dysfunction-associated steatohepatitis (MASH)   2. S/P lumbar discectomy   3. Age-related osteoporosis without current pathological fracture   4. Genitourinary syndrome of menopause   5. Dyslipidemia, goal LDL below 130, Rx fish oil   6. Postmenopausal estrogen deficiency    Assessment & Plan MASH MASH with mildly heterogeneous liver parenchyma and increased echogenicity. Discussed Wegovy  as a treatment option for fatty liver. Fib4   Fibrosis 4 Score = 1.25 Score is based on outdated labs. ALT, AST, and platelets should all be measured within the last 6 months for an accurate FIB-4 Score Fib-4 interpretation is not validated for people under 35 or over 45 years of age. However, scores under 2.0 are generally considered low risk.  Wegovy  Prior Authorization Information  Diagnosis Codes: E66.3 - Overweight (BMI 25-29.9) Z68.27 - BMI 27 E88.81 - Insulin  resistance / Metabolic syndrome E78.5 - Hyperlipidemia K76.0 - Hepatic steatosis (MASLD)  Clinical Justification: The patient has a BMI of 27, which falls in the overweight range with clinically significant obesity-related comorbidities, including:  Insulin  resistance Dyslipidemia (persistent LDL elevation and intermittent triglycerides 150-365 mg/dL despite Lovaza ) Ultrasound-confirmed hepatic steatosis (MASLD) Progressive cardiometabolic risk  Per FDA labeling, Wegovy  is indicated for patients with BMI >=27 with at least one weight-related comorbidity, which this patient clearly meets.  Failed Conservative Therapy: The patient has completed >6 months of structured lifestyle intervention, including: Reduced-calorie, lower-carbohydrate diet Increased physical activity Omega-3 therapy (Lovaza ) with persistent dyslipidemia Persistent insulin  resistance despite lifestyle changes  Despite these interventions, the patient's weight and metabolic profile remain suboptimal, and MASLD continues to  pose long-term hepatic and cardiovascular risk.  Wegovy  is medically necessary to: Achieve clinically significant weight reduction Improve insulin  resistance and reduce risk of progression to type 2 diabetes Reduce hepatic steatosis and normalize liver inflammation markers Improve dyslipidemia and reduce long-term cardiovascular risk Address obesity-related metabolic disease not adequately controlled with lifestyle measures alone  Current guidelines from AACE, ADA, and AASLD support GLP-1 agonist therapy in patients with overweight plus metabolic complications.  Requested Authorization: Approval for Wegovy  0.25 mg weekly, titrating to 2.4 mg weekly, for chronic weight management and treatment of obesity-related metabolic disease.  No contraindications. Patient is motivated and appropriate for long-term therapy.  Osteoporosis Bone density of -2.6. On Prolia  with plans to resume treatment. Engaging in weight training and Pilates. - Resume Prolia  injections. - Continue weight training and Pilates.  Overweight with insulin  resistance and hyperlipidemia Overweight with insulin  resistance and hyperlipidemia. Discussed Wegovy  for fatty liver and overall health. Emphasized weight management for liver health, cholesterol control, and insulin  resistance. - Consider Wegovy  for weight management and fatty liver treatment. - Continue exercise regimen including Pilates and weight training.  Chronic low back and sacroiliac pain with neuropathy and piriformis syndrome Chronic low back and sacroiliac pain with neuropathy and piriformis syndrome. Engaging in physical therapy and Pilates. Flexeril  used for muscle spasms with noted improvement. - Continue physical therapy and Pilates. - Continue Flexeril  for muscle spasms.  History of breast cancer History of breast cancer. Due for a mammogram with consideration for contrast-enhanced mammogram due to high risk factors. GYN is working on this. -  Coordinate with prior authorization team for coverage.  Menopausal state Menopausal state. Followed by GYN/  General Health Maintenance General health maintenance discussed, including recent colonoscopy and skin checks. Engaged in regular physical activity and monitoring of health conditions. - Continue regular physical activity.   Geni Shutter, DO, MS, FAAFP, Dipl. ABOM  [facility] [address] [place], [address] Dept: [phone] Dept Fax: [phone]  Subjective:   Patient is well-known to me from previous care setting and is establishing care in this system with me as PCP. Prior records reviewed when available. Chart updated today with reconciliation of problem list, medications, allergies, and relevant history. Preventive care and chronic disease status reviewed. Portions of historical chart may remain incomplete; will update on an ongoing basis as clinically indicated.  Discussed the use of AI scribe software for clinical note transcription with the patient, who gave verbal consent to proceed.  History of Present Illness Jessica Mack is a 68 year old female with NASH and obesity who presents for follow-up on her weight management and liver health.  Weight management and obesity - Obesity with recent use of Zepbound  for weight reduction. - Weight decreased from 154 to 146 pounds while on Zepbound ; weight returned to 154 pounds after discontinuation due to running out of medication. - Remains physically active with pickleball, weight training, and Pilates. - Expresses concern about coordinating exercise regimen.  Nonalcoholic steatohepatitis (nash) and liver health - NASH with ongoing monitoring of liver health. - Ultrasound demonstrates increased echogenicity of liver parenchyma, unchanged since 2019. - Previously noted liver cyst is no longer present.  Sleep apnea - Sleep apnea with a score of 7.7. - Uses a wedge for  sleeping.  Osteoporosis - Osteoporosis with bone density score of -2.6. - Receives Prolia  therapy. - Also on estradiol , Bystolic , Benicar , and Lovaza . - Recently switched omega-3 supplement brands due to adverse effects.  History of breast cancer - History of breast cancer. - Due for mammogram.  Musculoskeletal pain and radiculopathy - Chronic back pain and shoulder discomfort. - Undergoing physical therapy. - Uses Flexeril  for muscle spasms. - Radiculopathy with persistent numbness in the affected dermatome.  Cutaneous lesions - Seborrheic and actinic keratosis treated. - Spindle cell lesion removed from calf.  Review of Systems: Negative, with the exception of above mentioned in HPI.  Current Outpatient Medications:    clonazePAM  (KLONOPIN ) 1 MG tablet, TAKE 1 TABLET BY MOUTH AT BEDTIME, Disp: 90 tablet, Rfl: 1   cyclobenzaprine  (FLEXERIL ) 10 MG tablet, Take 1 tablet (10 mg total) by mouth 3 (three) times daily as needed for muscle spasms., Disp: 60 tablet, Rfl: 2   Estradiol  10 MCG TABS vaginal tablet, 1 tablet vaginally twice weekly (Patient taking differently: Place 10 mcg vaginally once a week. 1 tablet vaginally weekly), Disp: 24 tablet, Rfl: 3   famciclovir  (FAMVIR ) 500 MG tablet, 3 tablets (1500mg ) at onset of fever blister symptoms, Disp: 9 tablet, Rfl: 4   famotidine  (PEPCID ) 20 MG tablet, Take 20 mg by mouth daily., Disp: , Rfl:    Multiple Vitamins-Minerals (MULTIVITAMIN PO), Take 1 tablet by mouth daily. 50+, Disp: , Rfl:    nebivolol  (BYSTOLIC ) 5 MG tablet, TAKE 1 TABLET BY MOUTH DAILY, Disp: 90 tablet, Rfl: 1   olmesartan  (BENICAR ) 5 MG tablet, TAKE 2 TABLETS BY MOUTH DAILY, Disp: 180 tablet, Rfl: 0   omega-3 acid ethyl esters (LOVAZA ) 1 g capsule, TAKE 2 CAPSULES BY MOUTH 2 TIMES A DAY, Disp: 360 capsule, Rfl: 0   Probiotic Product (ALIGN PO), Take 1 tablet by mouth daily., Disp: , Rfl:   Current Facility-Administered Medications:    [START ON 10/14/2024]  denosumab  (PROLIA ) injection 60 mg, 60 mg, Subcutaneous, Q6 months, Persons, Ronal Dragon, PA   Objective:   BP 116/72 (BP Location: Right Arm,  Cuff Size: Normal)   Pulse 83   Ht 5' 2.5 (1.588 m)   Wt 159 lb 9.6 oz (72.4 kg)   LMP 06/26/2004   SpO2 99%   BMI 28.73 kg/m   Wt Readings from Last 3 Encounters:  09/29/24 159 lb 9.6 oz (72.4 kg)  07/28/24 154 lb 3.2 oz (69.9 kg)  03/28/24 152 lb (68.9 kg)   Physical Exam Constitutional:      General: She is not in acute distress.    Appearance: She is well-developed.  HENT:     Head: Normocephalic and atraumatic.  Eyes:     Conjunctiva/sclera: Conjunctivae normal.  Cardiovascular:     Rate and Rhythm: Normal rate and regular rhythm.     Heart sounds: Normal heart sounds.  Pulmonary:     Effort: Pulmonary effort is normal.     Breath sounds: Normal breath sounds.  Neurological:     General: No focal deficit present.     Mental Status: She is alert.  Psychiatric:        Behavior: Behavior normal.    Lab Results  Component Value Date   CREATININE 0.77 12/27/2023   BUN 20 12/27/2023   NA 140 12/27/2023   K 4.3 12/27/2023   CL 103 12/27/2023   CO2 27 12/27/2023   Lab Results  Component Value Date   ALT 22 12/27/2023   AST 21 12/27/2023   ALKPHOS 65 12/27/2023   BILITOT 0.7 12/27/2023   Lab Results  Component Value Date   HGBA1C 5.4 12/27/2023   HGBA1C 5.5 04/02/2023   HGBA1C 5.3 02/10/2022   HGBA1C 5.3 10/15/2021   HGBA1C 5.4 07/15/2021   Lab Results  Component Value Date   INSULIN  22.4 10/15/2021   INSULIN  10.2 07/15/2021   Lab Results  Component Value Date   TSH 3.62 12/27/2023   Lab Results  Component Value Date   CHOL 233 (H) 12/27/2023   HDL 58.90 12/27/2023   LDLCALC 139 (H) 12/27/2023   LDLDIRECT 116.0 05/31/2020   TRIG 173.0 (H) 12/27/2023   CHOLHDL 4 12/27/2023   Lab Results  Component Value Date   VD25OH 41 03/22/2024   VD25OH 47.1 10/15/2021   VD25OH 37.2 07/15/2021   Lab Results   Component Value Date   WBC 3.8 (L) 12/27/2023   HGB 13.1 12/27/2023   HCT 38.6 12/27/2023   MCV 93.3 12/27/2023   PLT 236.0 12/27/2023   Lab Results  Component Value Date   IRON 126 12/27/2023   TIBC 347.2 12/27/2023   FERRITIN 305.5 (H) 12/27/2023

## 2024-10-02 DIAGNOSIS — M545 Low back pain, unspecified: Secondary | ICD-10-CM | POA: Diagnosis not present

## 2024-10-02 MED ORDER — WEGOVY 0.25 MG/0.5ML ~~LOC~~ SOAJ: 0.2500 mg | SUBCUTANEOUS | 0 refills | Status: AC

## 2024-10-10 ENCOUNTER — Telehealth: Payer: Self-pay | Admitting: Pharmacy Technician

## 2024-10-10 ENCOUNTER — Encounter: Payer: Self-pay | Admitting: Pharmacy Technician

## 2024-10-10 ENCOUNTER — Other Ambulatory Visit (HOSPITAL_COMMUNITY): Payer: Self-pay

## 2024-10-10 NOTE — Telephone Encounter (Signed)
 Pharmacy Patient Advocate Encounter   Received notification from Onbase that prior authorization for Wegovy  0.25MG /0.5ML auto-injectors  is required/requested.   Insurance verification completed.   The patient is insured through [facility] ADVANTAGE/RX ADVANCE.   Per test claim: PA required; PA submitted to above mentioned insurance via Latent Key/confirmation #/EOC [employer] Status is pending

## 2024-10-10 NOTE — Telephone Encounter (Signed)
 Error

## 2024-10-12 NOTE — Telephone Encounter (Signed)
 Pharmacy Patient Advocate Encounter  Received notification from [facility] ADVANTAGE/RX ADVANCE that Prior Authorization for Wegovy  0.25MG /0.5ML auto-injectors  has been DENIED.  Full denial letter will be uploaded to the media tab. See denial reason below.   PA #/Case ID/Reference #: [id number]

## 2024-10-24 ENCOUNTER — Ambulatory Visit: Admitting: Physician Assistant

## 2024-10-24 DIAGNOSIS — M810 Age-related osteoporosis without current pathological fracture: Secondary | ICD-10-CM | POA: Diagnosis not present

## 2024-10-24 NOTE — Progress Notes (Signed)
 "  Office Visit Note   Patient: Jessica Mack           Date of Birth: January 30, 1956           MRN: [id number] Visit Date: 10/24/2024              Requested by: Joshua Debby CROME, MD [address] [place],  [place] [address] PCP: Prentiss Frieze, DO  Chief Complaint  Patient presents with   Injections    Prolia       HPI Patient comes in today for her second Prolia  injection.  Tolerated first 1 well has no complaints  Assessment & Plan: Visit Diagnoses:  1. Age-related osteoporosis without current pathological fracture     Plan: Patient had injection without difficulty.  Will draw calcium today.  Will follow-up for next Prolia  in 6 months we will also order a new bone scan at that time for medication monitoring  Follow-Up Instructions: No follow-ups on file.   Ortho Exam  Patient is alert, oriented, no adenopathy, well-dressed, normal affect, normal respiratory effort.     Imaging: No results found. No images are attached to the encounter.  Labs: Lab Results  Component Value Date   HGBA1C 5.4 12/27/2023   HGBA1C 5.5 04/02/2023   HGBA1C 5.3 02/10/2022   CRP <1.0 12/27/2023   REPTSTATUS 03/16/2022 FINAL 03/13/2022   CULT  03/13/2022    NO GROUP A STREP (S.PYOGENES) ISOLATED Performed at [facility], [address], [place], [place] [address]      Lab Results  Component Value Date   ALBUMIN 4.7 12/27/2023   ALBUMIN 3.2 (L) 09/08/2023   ALBUMIN 4.7 04/02/2023    No results found for: MG Lab Results  Component Value Date   VD25OH 41 03/22/2024   VD25OH 47.1 10/15/2021   VD25OH 37.2 07/15/2021    No results found for: PREALBUMIN    Latest Ref Rng & Units 12/27/2023    8:12 AM 09/30/2023    1:24 PM 09/08/2023    4:01 PM  CBC EXTENDED  WBC 4.0 - 10.5 K/uL 3.8  8.1  7.6   RBC 3.87 - 5.11 Mil/uL 4.13  3.65  3.08   Hemoglobin 12.0 - 15.0 g/dL 86.8  87.3  9.9   HCT 63.9 - 46.0 % 38.6  35.6  31.8   Platelets 150.0 - 400.0 K/uL 236.0  338.0   204   NEUT# 1.4 - 7.7 K/uL 1.4  5.4  6.1   Lymph# 0.7 - 4.0 K/uL 1.9  1.7  0.6      There is no height or weight on file to calculate BMI.  Orders:  Orders Placed This Encounter  Procedures   Calcium   No orders of the defined types were placed in this encounter.    Procedures: No procedures performed  Clinical Data: No additional findings.  ROS:  All other systems negative, except as noted in the HPI. Review of Systems  Objective: Vital Signs: LMP 06/26/2004   Specialty Comments:  MRI LUMBAR SPINE WITHOUT CONTRAST   TECHNIQUE: Multiplanar, multisequence MR imaging of the lumbar spine was performed. No intravenous contrast was administered.   COMPARISON:  None Available.   FINDINGS: Segmentation:  Standard.   Alignment:  Straightening of the normal lumbar lordosis.   Vertebrae: No fracture, evidence of discitis, or bone lesion. Degenerative endplate changes at the inferior and superior endplates of L4 on L5, as well as L5 on S1.   Conus medullaris and cauda equina:  Conus extends to the L1-L2 disc space level. Conus and cauda equina appear normal.   Paraspinal and other soft tissues: There is nonspecific inward buckling of the anterior abdominal wall on the right (series 9, image 30).   Disc levels:   T12-L1: Unremarkable   L1-L2: Mild bilateral facet degenerative change. No significant disc bulge. No spinal canal narrowing. No neural foraminal narrowing.   L2-L3: No significant disc bulge. Mild bilateral facet degenerative change. No spinal canal narrowing. Mild right neural foraminal narrowing.   L3-L4: Circumferential disc bulge. Mild bilateral facet degenerative change. Mild spinal canal narrowing. Mild right neural foraminal narrowing.   L4-L5: Circumferential disc bulge with a right paracentral disc protrusion that obliterates the right lateral recess. Mild overall spinal canal narrowing. Moderate right and mild left neural foraminal  narrowing.   L5-S1: Eccentric left disc bulge with a right foraminal disc protrusion. Mild narrowing of the left lateral recess. Mild bilateral facet degenerative change. Severe right neural foraminal narrowing and mild left neural foraminal narrowing.   IMPRESSION: 1. Right paracentral disc protrusion at L4-L5 severely narrows the right lateral recess. 2. Severe right neural foraminal narrowing at L5-S1 secondary to a right foraminal disc protrusion. 3. Chronic nonspecific inward buckling of the anterior abdominal wall on the right. Recommend correlation with physical exam.     Electronically Signed   By: Lyndall Gore M.D.   On: 07/01/2023 18:06  PMFS History: Patient Active Problem List   Diagnosis Date Noted   S/P lumbar discectomy, procedure: right L4/5 hemilaminotomy and microdiscectomy, right L5/S1 foraminotomy. date of Surgery: 08/24/2023 09/29/2024   Genitourinary syndrome of menopause 09/29/2024   Postmenopausal estrogen deficiency 09/29/2024   Dyslipidemia, goal LDL below 130, Rx Lovaza  03/28/2024   Radiculopathy, lumbar region 08/24/2023   Elevated ferritin level 04/05/2023   Metabolic dysfunction-associated steatohepatitis (MASH) 06/04/2020   Essential hypertension 02/20/2020   Age-related osteoporosis without current pathological fracture, DXA 02/14/24, Prolia  07/25/2013   Breast cancer of upper-outer quadrant of left female breast (HCC) 07/20/2013   Leukoplakia of oral mucosa, including tongue 07/26/2012   Essential and other specified forms of tremor 07/26/2012   Drug-induced polyneuropathy 07/26/2012   Past Medical History:  Diagnosis Date   Allergy ??   Ceph, Crestor   Arthritis 2015   Avascular necrosis of bone (HCC)    Left talus   Blood transfusion    Blood transfusion without reported diagnosis 2005   Breast cancer (HCC) 2005   left   Cancer (HCC) 2005   Dysplastic nevus 1988   right hip   Fatty liver    GERD (gastroesophageal reflux disease)     History of breast cancer 2005   invasive ductal cancer left breast   Hypertension    Hypertriglyceridemia    Irritable bowel syndrome    Metabolic syndrome    resolved with diet and exercise   NASH (nonalcoholic steatohepatitis)    Nocturnal leg cramps 02/14/2015   Osteopenia    Palpitations 09/2012   normal echo   Peripheral neuropathy    Personal history of chemotherapy    Personal history of radiation therapy    Plantar fasciitis 03/2003   PONV (postoperative nausea and vomiting)    Tongue dysplasia    Ulcer     Family History  Problem Relation Age of Onset   Thyroid  disease Mother    Hyperlipidemia Mother    Hypertension Mother    Diabetes Mother    Cancer Mother        melanoma  Heart disease Mother    Depression Mother    Sleep apnea Mother    Obesity Mother    Heart disease Father    Hyperlipidemia Father    Hypertension Father    Cancer Father        colon   Prostate cancer Father    Kidney disease Father    Asthma Sister    Asthma Brother    ADD / ADHD Son    Anxiety disorder Daughter    Depression Daughter    Obesity Daughter    Breast cancer Neg Hx     Past Surgical History:  Procedure Laterality Date   ANKLE SURGERY  07/2007   left, revascularization   BREAST LUMPECTOMY  06/2004   left, with port placement   CESAREAN SECTION  1991, 1993   x2   COLONOSCOPY  09/2013   Dr. Kristie   DG GALL BLADDER     IRRIGATION AND DEBRIDEMENT ELBOW Left 05/04/2023   Procedure: LEFT ELBOW IRRIGATION AND DEBRIDEMENT;  Surgeon: Genelle Standing, MD;  Location: [facility];  Service: Orthopedics;  Laterality: Left;   LAPAROSCOPIC CHOLECYSTECTOMY  2003   LUMBAR LAMINECTOMY Right 08/24/2023   Procedure: LUMBAR FOUR-FIVE MICRODISCECTOMY, LUMBAR FIVE-SACRAL ONE RIGHT FORAMINOTOMY;  Surgeon: Georgina Ozell LABOR, MD;  Location: [facility];  Service: Orthopedics;  Laterality: Right;   MOUTH SURGERY  11/15   implant/crown placed 4/16   SHOULDER ARTHROSCOPY  10/2005    right   SHOULDER SURGERY  12/15   left   TONGUE SURGERY  2012, 2013   WRIST SURGERY  1988   right, [place]   Social History   Occupational History    Employer: [employer]   Occupation: MD/Adjunct Professor  Tobacco Use   Smoking status: Never   Smokeless tobacco: Never  Vaping Use   Vaping status: Never Used  Substance and Sexual Activity   Alcohol use: Yes    Alcohol/week: 7.0 standard drinks of alcohol    Types: 7 Standard drinks or equivalent per week    Comment: occas   Drug use: No   Sexual activity: Yes    Partners: Male    Birth control/protection: Post-menopausal    Comment: husband vasectomy       "

## 2024-10-25 LAB — CALCIUM: Calcium: 10.4 mg/dL (ref 8.6–10.4)

## 2024-11-01 ENCOUNTER — Ambulatory Visit
Admission: RE | Admit: 2024-11-01 | Discharge: 2024-11-01 | Disposition: A | Discharge status: Home / Self Care | Source: Ambulatory Visit | Attending: Obstetrics & Gynecology | Admitting: Obstetrics & Gynecology

## 2024-11-01 DIAGNOSIS — Z1231 Encounter for screening mammogram for malignant neoplasm of breast: Secondary | ICD-10-CM

## 2024-11-01 DIAGNOSIS — Z853 Personal history of malignant neoplasm of breast: Secondary | ICD-10-CM

## 2024-11-01 MED ORDER — IOPAMIDOL (ISOVUE-370) INJECTION 76%
100.0000 mL | Freq: Once | INTRAVENOUS | Status: AC | PRN
  Administered 2024-11-01: 100 mL via INTRAVENOUS

## 2024-11-15 ENCOUNTER — Other Ambulatory Visit: Payer: Self-pay | Admitting: Internal Medicine

## 2024-11-15 DIAGNOSIS — I10 Essential (primary) hypertension: Secondary | ICD-10-CM

## 2024-11-20 ENCOUNTER — Other Ambulatory Visit: Payer: Self-pay | Admitting: Internal Medicine

## 2024-11-20 DIAGNOSIS — E781 Pure hyperglyceridemia: Secondary | ICD-10-CM

## 2024-11-20 DIAGNOSIS — I10 Essential (primary) hypertension: Secondary | ICD-10-CM

## 2024-11-21 ENCOUNTER — Other Ambulatory Visit: Payer: Self-pay

## 2024-11-21 DIAGNOSIS — I10 Essential (primary) hypertension: Secondary | ICD-10-CM

## 2024-11-21 DIAGNOSIS — E781 Pure hyperglyceridemia: Secondary | ICD-10-CM

## 2024-11-21 MED ORDER — NEBIVOLOL HCL 5 MG PO TABS: 5.0000 mg | ORAL_TABLET | Freq: Every day | ORAL | 1 refills | Status: AC

## 2024-11-21 MED ORDER — OLMESARTAN MEDOXOMIL 5 MG PO TABS: 10.0000 mg | ORAL_TABLET | Freq: Every day | ORAL | 0 refills | Status: AC

## 2024-11-21 MED ORDER — OMEGA-3-ACID ETHYL ESTERS 1 G PO CAPS: 2.0000 g | ORAL_CAPSULE | Freq: Two times a day (BID) | ORAL | 0 refills | Status: AC

## 2024-11-21 NOTE — Telephone Encounter (Signed)
 Copied from CRM #[id number]. Topic: Clinical - Medication Refill >> Nov 21, 2024 12:58 PM Berneda F wrote: Medication:  nebivolol  (BYSTOLIC ) 5 MG tablet olmesartan  (BENICAR ) 5 MG tablet omega-3 acid ethyl esters (LOVAZA ) 1 g capsule  Has the patient contacted their pharmacy? Yes (Agent: If no, request that the patient contact the pharmacy for the refill. If patient does not wish to contact the pharmacy document the reason why and proceed with request.) (Agent: If yes, when and what did the pharmacy advise?)  This is the patient's preferred pharmacy:  [facility] [id number] [place], [place] - [address] [place] [place] [address] Phone: [phone] Fax: [phone]  Is this the correct pharmacy for this prescription? Yes If no, delete pharmacy and type the correct one.   Has the prescription been filled recently? No  Is the patient out of the medication? Yes  Has the patient been seen for an appointment in the last year OR does the patient have an upcoming appointment? Yes  Can we respond through MyChart? Yes  Agent: Please be advised that Rx refills may take up to 3 business days. We ask that you follow-up with your pharmacy.

## 2024-11-21 NOTE — Telephone Encounter (Signed)
 Refill request received for  nebivolol  (BYSTOLIC ) 5 MG tablet FOV:12/29/2024 LOV:09/29/2024 Last refill:05/19/24 Medication is pending your approval.    Refill request received for  olmesartan  (BENICAR ) 5 MG tablet FOV:12/29/2024 LOV:09/29/2024 Last refill:09/04/24 Medication is pending your approval.   Refill request received for  omega-3 acid ethyl esters (LOVAZA ) 1 g capsule FOV:12/29/2024 LOV: 09/29/2024 Last refill:09/04/24 Medication is pending your approval.

## 2024-11-27 ENCOUNTER — Encounter (HOSPITAL_BASED_OUTPATIENT_CLINIC_OR_DEPARTMENT_OTHER): Payer: Self-pay | Admitting: Obstetrics & Gynecology

## 2024-11-27 ENCOUNTER — Other Ambulatory Visit (HOSPITAL_BASED_OUTPATIENT_CLINIC_OR_DEPARTMENT_OTHER): Payer: Self-pay | Admitting: Obstetrics & Gynecology

## 2024-11-27 MED ORDER — FAMCICLOVIR 500 MG PO TABS: ORAL_TABLET | ORAL | 4 refills | Status: AC

## 2024-12-29 ENCOUNTER — Ambulatory Visit: Admitting: Family Medicine

## 2025-04-25 ENCOUNTER — Ambulatory Visit: Admitting: Physician Assistant
